# Patient Record
Sex: Male | Born: 1955 | Race: Black or African American | Hispanic: No | Marital: Married | Smoking: Current some day smoker
Health system: Southern US, Community
[De-identification: ages and names within clinical notes are randomized; demographics above are authoritative.]

## PROBLEM LIST (undated history)

## (undated) DIAGNOSIS — G43909 Migraine, unspecified, not intractable, without status migrainosus: Secondary | ICD-10-CM

## (undated) DIAGNOSIS — J42 Unspecified chronic bronchitis: Secondary | ICD-10-CM

## (undated) DIAGNOSIS — R079 Chest pain, unspecified: Secondary | ICD-10-CM

## (undated) DIAGNOSIS — M549 Dorsalgia, unspecified: Secondary | ICD-10-CM

## (undated) DIAGNOSIS — Z21 Asymptomatic human immunodeficiency virus [HIV] infection status: Secondary | ICD-10-CM

## (undated) DIAGNOSIS — R0609 Other forms of dyspnea: Secondary | ICD-10-CM

## (undated) DIAGNOSIS — I10 Essential (primary) hypertension: Secondary | ICD-10-CM

## (undated) DIAGNOSIS — G8929 Other chronic pain: Secondary | ICD-10-CM

## (undated) DIAGNOSIS — M545 Low back pain, unspecified: Secondary | ICD-10-CM

## (undated) DIAGNOSIS — M519 Unspecified thoracic, thoracolumbar and lumbosacral intervertebral disc disorder: Secondary | ICD-10-CM

## (undated) DIAGNOSIS — I723 Aneurysm of iliac artery: Secondary | ICD-10-CM

## (undated) DIAGNOSIS — J189 Pneumonia, unspecified organism: Secondary | ICD-10-CM

## (undated) DIAGNOSIS — G039 Meningitis, unspecified: Secondary | ICD-10-CM

## (undated) DIAGNOSIS — M1990 Unspecified osteoarthritis, unspecified site: Secondary | ICD-10-CM

## (undated) DIAGNOSIS — I2510 Atherosclerotic heart disease of native coronary artery without angina pectoris: Secondary | ICD-10-CM

## (undated) DIAGNOSIS — R0600 Dyspnea, unspecified: Secondary | ICD-10-CM

## (undated) HISTORY — DX: Aneurysm of iliac artery: I72.3

## (undated) HISTORY — PX: ABDOMINAL SURGERY: SHX537

## (undated) HISTORY — DX: Unspecified thoracic, thoracolumbar and lumbosacral intervertebral disc disorder: M51.9

## (undated) HISTORY — PX: SPINE SURGERY: SHX786

## (undated) HISTORY — PX: ELBOW SURGERY: SHX618

---

## 1993-09-18 HISTORY — PX: POSTERIOR LUMBAR FUSION: SHX6036

## 2002-09-18 DIAGNOSIS — Z21 Asymptomatic human immunodeficiency virus [HIV] infection status: Secondary | ICD-10-CM

## 2002-09-18 HISTORY — DX: Asymptomatic human immunodeficiency virus (hiv) infection status: Z21

## 2004-09-18 HISTORY — PX: BACK SURGERY: SHX140

## 2008-05-14 DIAGNOSIS — G8929 Other chronic pain: Secondary | ICD-10-CM | POA: Insufficient documentation

## 2008-05-14 DIAGNOSIS — F411 Generalized anxiety disorder: Secondary | ICD-10-CM | POA: Insufficient documentation

## 2008-05-14 HISTORY — DX: Other chronic pain: G89.29

## 2009-06-25 DIAGNOSIS — F1410 Cocaine abuse, uncomplicated: Secondary | ICD-10-CM | POA: Insufficient documentation

## 2009-06-25 HISTORY — DX: Cocaine abuse, uncomplicated: F14.10

## 2011-05-18 DIAGNOSIS — E559 Vitamin D deficiency, unspecified: Secondary | ICD-10-CM | POA: Insufficient documentation

## 2011-05-18 HISTORY — DX: Vitamin D deficiency, unspecified: E55.9

## 2011-06-04 ENCOUNTER — Emergency Department (HOSPITAL_COMMUNITY): Payer: Medicare Other

## 2011-06-04 ENCOUNTER — Emergency Department (HOSPITAL_COMMUNITY)
Admission: EM | Admit: 2011-06-04 | Discharge: 2011-06-04 | Disposition: A | Discharge status: Home / Self Care | Payer: Medicare Other | Attending: Emergency Medicine | Admitting: Emergency Medicine

## 2011-06-04 DIAGNOSIS — R109 Unspecified abdominal pain: Secondary | ICD-10-CM | POA: Insufficient documentation

## 2011-06-04 DIAGNOSIS — S139XXA Sprain of joints and ligaments of unspecified parts of neck, initial encounter: Secondary | ICD-10-CM | POA: Insufficient documentation

## 2011-06-04 DIAGNOSIS — S335XXA Sprain of ligaments of lumbar spine, initial encounter: Secondary | ICD-10-CM | POA: Insufficient documentation

## 2011-06-04 DIAGNOSIS — Z043 Encounter for examination and observation following other accident: Secondary | ICD-10-CM | POA: Insufficient documentation

## 2011-06-04 DIAGNOSIS — W010XXA Fall on same level from slipping, tripping and stumbling without subsequent striking against object, initial encounter: Secondary | ICD-10-CM | POA: Insufficient documentation

## 2011-06-04 MED ORDER — IOHEXOL 300 MG/ML  SOLN
130.0000 mL | Freq: Once | INTRAMUSCULAR | Status: AC | PRN
  Administered 2011-06-04: 130 mL via INTRAVENOUS

## 2011-09-19 HISTORY — PX: APPENDECTOMY: SHX54

## 2011-10-20 DIAGNOSIS — M5090 Cervical disc disorder, unspecified, unspecified cervical region: Secondary | ICD-10-CM

## 2011-10-20 HISTORY — PX: INTUSSUSCEPTION REPAIR: SHX1847

## 2011-10-20 HISTORY — DX: Cervical disc disorder, unspecified, unspecified cervical region: M50.90

## 2011-10-28 ENCOUNTER — Emergency Department (HOSPITAL_COMMUNITY)
Admission: EM | Admit: 2011-10-28 | Discharge: 2011-10-28 | Disposition: A | Discharge status: Home / Self Care | Payer: Medicare Other | Attending: Emergency Medicine | Admitting: Emergency Medicine

## 2011-10-28 ENCOUNTER — Other Ambulatory Visit: Payer: Self-pay

## 2011-10-28 ENCOUNTER — Encounter (HOSPITAL_COMMUNITY): Payer: Self-pay | Admitting: *Deleted

## 2011-10-28 DIAGNOSIS — R109 Unspecified abdominal pain: Secondary | ICD-10-CM | POA: Insufficient documentation

## 2011-10-28 DIAGNOSIS — M545 Low back pain, unspecified: Secondary | ICD-10-CM | POA: Insufficient documentation

## 2011-10-28 DIAGNOSIS — Z21 Asymptomatic human immunodeficiency virus [HIV] infection status: Secondary | ICD-10-CM | POA: Insufficient documentation

## 2011-10-28 HISTORY — DX: Asymptomatic human immunodeficiency virus (hiv) infection status: Z21

## 2011-10-28 LAB — URINALYSIS, ROUTINE W REFLEX MICROSCOPIC
Bilirubin Urine: NEGATIVE
Glucose, UA: NEGATIVE mg/dL
Hgb urine dipstick: NEGATIVE
Ketones, ur: NEGATIVE mg/dL
Leukocytes, UA: NEGATIVE
Nitrite: NEGATIVE
Protein, ur: NEGATIVE mg/dL
Specific Gravity, Urine: 1.022 (ref 1.005–1.030)
Urobilinogen, UA: 1 mg/dL (ref 0.0–1.0)
pH: 5.5 (ref 5.0–8.0)

## 2011-10-28 LAB — DIFFERENTIAL
Basophils Absolute: 0 10*3/uL (ref 0.0–0.1)
Basophils Relative: 1 % (ref 0–1)
Eosinophils Absolute: 0.2 10*3/uL (ref 0.0–0.7)
Eosinophils Relative: 3 % (ref 0–5)
Lymphocytes Relative: 44 % (ref 12–46)
Lymphs Abs: 2.7 10*3/uL (ref 0.7–4.0)
Monocytes Absolute: 0.6 10*3/uL (ref 0.1–1.0)
Monocytes Relative: 10 % (ref 3–12)
Neutro Abs: 2.6 10*3/uL (ref 1.7–7.7)
Neutrophils Relative %: 42 % — ABNORMAL LOW (ref 43–77)

## 2011-10-28 LAB — COMPREHENSIVE METABOLIC PANEL
ALT: 18 U/L (ref 0–53)
AST: 18 U/L (ref 0–37)
Albumin: 3.4 g/dL — ABNORMAL LOW (ref 3.5–5.2)
Alkaline Phosphatase: 95 U/L (ref 39–117)
BUN: 13 mg/dL (ref 6–23)
CO2: 25 mEq/L (ref 19–32)
Calcium: 9.4 mg/dL (ref 8.4–10.5)
Chloride: 104 mEq/L (ref 96–112)
Creatinine, Ser: 0.81 mg/dL (ref 0.50–1.35)
GFR calc Af Amer: >90 mL/min (ref >90)
GFR calc non Af Amer: >90 mL/min (ref >90)
Glucose, Bld: 98 mg/dL (ref 70–99)
Potassium: 3.5 mEq/L (ref 3.5–5.1)
Sodium: 138 mEq/L (ref 135–145)
Total Bilirubin: 0.3 mg/dL (ref 0.3–1.2)
Total Protein: 8.2 g/dL (ref 6.0–8.3)

## 2011-10-28 LAB — TROPONIN I: Troponin I: <0.3 ng/mL (ref <0.30)

## 2011-10-28 LAB — CBC
HCT: 36.2 % — ABNORMAL LOW (ref 39.0–52.0)
Hemoglobin: 11.9 g/dL — ABNORMAL LOW (ref 13.0–17.0)
MCH: 26 pg (ref 26.0–34.0)
MCHC: 32.9 g/dL (ref 30.0–36.0)
MCV: 79 fL (ref 78.0–100.0)
Platelets: 318 10*3/uL (ref 150–400)
RBC: 4.58 MIL/uL (ref 4.22–5.81)
RDW: 18.6 % — ABNORMAL HIGH (ref 11.5–15.5)
WBC: 6.1 10*3/uL (ref 4.0–10.5)

## 2011-10-28 LAB — LIPASE, BLOOD: Lipase: 31 U/L (ref 11–59)

## 2011-10-28 MED ORDER — HYDROMORPHONE HCL PF 1 MG/ML IJ SOLN
1.0000 mg | Freq: Once | INTRAMUSCULAR | Status: AC
  Administered 2011-10-28: 1 mg via INTRAVENOUS
  Filled 2011-10-28: qty 1

## 2011-10-28 MED ORDER — HYDROCODONE-ACETAMINOPHEN 5-325 MG PO TABS: 1.0000 | ORAL_TABLET | ORAL | Status: AC | PRN

## 2011-10-28 NOTE — ED Notes (Signed)
Pt had appendectomy 08/28/11.  Pt states he picked his granddaughter up this morning and felt a sudden sharp pain in his lower abd near the bottom of his incision.  Incision has healed completely, no swelling noted around site of pain.

## 2011-10-28 NOTE — ED Provider Notes (Signed)
History     CSN: [id number]  Arrival date & time 10/28/11  1120   First MD Initiated Contact with Patient 10/28/11 1131      Chief Complaint  Patient presents with  . Abdominal Pain    (Consider location/radiation/quality/duration/timing/severity/associated sxs/prior treatment) HPI History provided by pt and his wife.  Patient's wife reports that pt seen at [facility] ED for abdominal pain in 08/2011, CT concerning for intussusception and he had emergent surgery.  Bowel was normal but appendix appeared to be infected and was removed.  Shortly after discharge from hospital he developed high fever and was re-admitted to the ICU for aseptic meningitis.  Has been home since mid-January.  Today he presents w/ diffuse, severe lower abdominal pain w/ radiation around to left and right lower back since this morning.  Started approx 5 minutes after lifting his grandchild.  Has had similar pain intermittently since surgery but seems to worsen each time.  No associated fever, N/V/D, urinary sx or testicular pain.  Most recent BM this morning and normal and has been passing gas. Denies CP and SOB.  Pt is HIV positive and most recent CD4 count greater than 1000 in January.  No past medical history on file.  No past surgical history on file.  No family history on file.  History  Substance Use Topics  . Smoking status: Not on file  . Smokeless tobacco: Not on file  . Alcohol Use: Not on file      Review of Systems  All other systems reviewed and are negative.    Allergies  Flexeril; Ibuprofen; Naproxen; and Tramadol  Home Medications  No current outpatient prescriptions on file.  BP 146/93  Pulse 78  Temp(Src) 98 F (36.7 C) (Oral)  Resp 16  Ht 5\' 8"  (1.727 m)  Wt 147 lb (66.679 kg)  BMI 22.35 kg/m2  SpO2 96%  Physical Exam  Nursing note and vitals reviewed. Constitutional: He is oriented to person, place, and time. He appears well-developed and well-nourished. No distress.       Uncomfortable appearing  HENT:  Head: Normocephalic and atraumatic.  Eyes:       Normal appearance  Neck: Normal range of motion.  Cardiovascular: Normal rate and regular rhythm.   Pulmonary/Chest: Effort normal and breath sounds normal.  Abdominal: Bowel sounds are normal. He exhibits no distension and no mass. There is tenderness. There is no rebound.       Large, mid-line surgical scar.  Diffuse tenderness.  Pt points to RUQ as being most painful location.  Pt guarding w/ contracted muscles. No CVA tenderness.  Genitourinary:       Penis uncircumcised. No urethral discharge.  No rash.  Testicles descended bilaterally and both non-tender.   Neurological: He is alert and oriented to person, place, and time.  Skin: Skin is warm and dry. No rash noted.  Psychiatric: He has a normal mood and affect. His behavior is normal.    ED Course  Procedures (including critical care time)   Date: 10/28/2011  Rate: 77  Rhythm: normal sinus rhythm  QRS Axis: left  Intervals: normal  ST/T Wave abnormalities: nonspecific ST changes  Conduction Disutrbances:none  Narrative Interpretation: borderline prolonged QT  Old EKG Reviewed: none available   Labs Reviewed  CBC - Abnormal; Notable for the following:    Hemoglobin 11.9 (*)    HCT 36.2 (*)    RDW 18.6 (*)    All other components within normal limits  DIFFERENTIAL - Abnormal;  Notable for the following:    Neutrophils Relative 42 (*)    All other components within normal limits  COMPREHENSIVE METABOLIC PANEL - Abnormal; Notable for the following:    Albumin 3.4 (*)    All other components within normal limits  LIPASE, BLOOD  URINALYSIS, ROUTINE W REFLEX MICROSCOPIC  TROPONIN I   No results found.   1. Abdominal pain       MDM  55yo M w/ recent appendectomy presents w/ c/o acute onset severe lower abdominal pain since lifting his grandchild this am.  Has similar pain intermittently since surgery but seems to worsen each  time.  No associated sx.  Afebrile, uncomfortable appearing, abd diffusely ttp w/ guarding (worst in RUQ and this is where patient points to as  most painful location currently) on exam.  Cholelithiasis unlikely because onset acute and no recent post-prandial pain/nausea.  SBO unlikely because normal BM this morning and has been passing gas. His physician has instructed him to avoid cat scans if possible because he has had so many in the recent past.  IV Dilaudid ordered for pain and labs pending.  EKG and troponin ordered d/t reported h/o MI and location of pain.   EKG non-ischemic and labs unremarkable.  Results discussed w/ pt.  He reports improvement in pain w/ single dose of IV dilaudid and he no longer appears uncomfortable.  On re-examination, tenderness localized to lower abdomen.  Most likely muscular or post-op pain related to heavy lifting.  Pt d/c'd home w/ vicodin for pain and advised to return if pain worsens or he develops fever or uncontrolled vomiting.  He has a f/u appt scheduled with his surgeon on Monday.        Arie Sabina [place], [place] 10/28/11 1345

## 2011-10-28 NOTE — ED Provider Notes (Signed)
Medical screening examination/treatment/procedure(s) were performed by non-physician practitioner and as supervising physician I was immediately available for consultation/collaboration.  Juliet Rude. Rubin Payor, MD 10/28/11 [phone]

## 2011-10-28 NOTE — ED Notes (Signed)
Patient given discharge instructions, information, prescriptions, and diet order. Patient states that they adequately understand discharge information given and to return to ED if symptoms return or worsen.     

## 2012-01-13 DIAGNOSIS — K219 Gastro-esophageal reflux disease without esophagitis: Secondary | ICD-10-CM | POA: Insufficient documentation

## 2012-01-13 HISTORY — DX: Gastro-esophageal reflux disease without esophagitis: K21.9

## 2012-08-17 ENCOUNTER — Emergency Department (HOSPITAL_COMMUNITY)
Admission: EM | Admit: 2012-08-17 | Discharge: 2012-08-17 | Disposition: A | Discharge status: Home / Self Care | Payer: Medicare Other | Attending: Emergency Medicine | Admitting: Emergency Medicine

## 2012-08-17 ENCOUNTER — Emergency Department (HOSPITAL_COMMUNITY): Payer: Medicare Other

## 2012-08-17 ENCOUNTER — Encounter (HOSPITAL_COMMUNITY): Payer: Self-pay | Admitting: Emergency Medicine

## 2012-08-17 DIAGNOSIS — M25552 Pain in left hip: Secondary | ICD-10-CM

## 2012-08-17 DIAGNOSIS — S79919A Unspecified injury of unspecified hip, initial encounter: Secondary | ICD-10-CM | POA: Insufficient documentation

## 2012-08-17 DIAGNOSIS — Z981 Arthrodesis status: Secondary | ICD-10-CM | POA: Insufficient documentation

## 2012-08-17 DIAGNOSIS — Y9389 Activity, other specified: Secondary | ICD-10-CM | POA: Insufficient documentation

## 2012-08-17 DIAGNOSIS — M25519 Pain in unspecified shoulder: Secondary | ICD-10-CM

## 2012-08-17 DIAGNOSIS — S46909A Unspecified injury of unspecified muscle, fascia and tendon at shoulder and upper arm level, unspecified arm, initial encounter: Secondary | ICD-10-CM | POA: Insufficient documentation

## 2012-08-17 DIAGNOSIS — Z21 Asymptomatic human immunodeficiency virus [HIV] infection status: Secondary | ICD-10-CM | POA: Insufficient documentation

## 2012-08-17 DIAGNOSIS — Z79899 Other long term (current) drug therapy: Secondary | ICD-10-CM | POA: Insufficient documentation

## 2012-08-17 DIAGNOSIS — S4980XA Other specified injuries of shoulder and upper arm, unspecified arm, initial encounter: Secondary | ICD-10-CM | POA: Insufficient documentation

## 2012-08-17 DIAGNOSIS — W108XXA Fall (on) (from) other stairs and steps, initial encounter: Secondary | ICD-10-CM | POA: Insufficient documentation

## 2012-08-17 DIAGNOSIS — Y929 Unspecified place or not applicable: Secondary | ICD-10-CM | POA: Insufficient documentation

## 2012-08-17 DIAGNOSIS — I10 Essential (primary) hypertension: Secondary | ICD-10-CM | POA: Insufficient documentation

## 2012-08-17 HISTORY — DX: Essential (primary) hypertension: I10

## 2012-08-17 MED ORDER — HYDROCODONE-ACETAMINOPHEN 5-325 MG PO TABS
1.0000 | ORAL_TABLET | Freq: Three times a day (TID) | ORAL | Status: DC | PRN
Start: 2012-08-17 — End: 2012-08-26

## 2012-08-17 MED ORDER — DIPHENHYDRAMINE HCL 25 MG PO CAPS
25.0000 mg | ORAL_CAPSULE | Freq: Four times a day (QID) | ORAL | Status: AC | PRN
  Administered 2012-08-17: 25 mg via ORAL
  Filled 2012-08-17: qty 1

## 2012-08-17 MED ORDER — HYDROMORPHONE HCL PF 1 MG/ML IJ SOLN
1.0000 mg | Freq: Once | INTRAMUSCULAR | Status: AC
  Administered 2012-08-17: 1 mg via INTRAVENOUS
  Filled 2012-08-17: qty 1

## 2012-08-17 NOTE — ED Notes (Signed)
Patient states that about 30 minutes ago he feel down 12 steps and now has back pain. History of fusion to his back. PAtient reports pain to his back and right shoulder numbness to his left foot

## 2012-08-17 NOTE — ED Notes (Signed)
Discharge instructions reviewed. Patient discharged with ice packs, r arm in sling. All questions answered. Rx given x1.

## 2012-08-17 NOTE — ED Notes (Signed)
Patient c/o increased pain since being taken to radiology. Patient states they made everything worse. Patient requesting a sling for his shoulder. Will con't to monitor.

## 2012-08-17 NOTE — ED Notes (Addendum)
Pt presents to ED with c/o fall. Pt sts he was helping his wife move some pots down the stairs when he lost his balance and fell. Pt also sts he "tumbled down from top to botom", 12 steps. Pt is c/o severe pain in his right shoulder, back all the way from the neck to his hips and sts his left leg is numb and can't feel his toes. Pulses are palpable, pt is able to move his toes. Pt also sts he hit his head, pt denies LOC there is area on the left side of his head, nickle size, that is swollen. Pt sts it's not painful. Pt is lying in the bed, appears in discomfort, sts pain is 10/10

## 2012-08-17 NOTE — Progress Notes (Signed)
Orthopedic Tech Progress Note Patient Details:  Kenneth [name] [birth year] [id number] Sling Immobilizer applied to Right UE with instruction. Patient tolerated well.  Ortho Devices Type of Ortho Device: Sling immobilizer Ortho Device/Splint Location: Right Ortho Device/Splint Interventions: Application   Asia R Thompson 08/17/2012, 9:25 PM

## 2012-08-17 NOTE — ED Notes (Addendum)
Patient c/o pain 9/10, to right shoulder, left hip down to toes, and left side of back radiating towards his neck. Patient able to move toes and identify digits bilateral. Patient states sensation to toes is same bilaterally. Small bump to left forehead, patient denies pain to his head. VSS.

## 2012-08-17 NOTE — ED Provider Notes (Signed)
History     CSN: [id number]  Arrival date & time 08/17/12  1656   First MD Initiated Contact with Patient 08/17/12 1833      Chief Complaint  Patient presents with  . Back Pain  . Fall    (Consider location/radiation/quality/duration/timing/severity/associated sxs/prior treatment) HPI The patient presents after a fall.  He states that he was in his usual state of health until a fall.  He states that he lost his balance, bounced down approximately 12 steps.  He denies significant head trauma, loss of consciousness, any residual confusion, disorientation, visual changes, nausea, vomiting.  He has been in [employer] since the fall.  He notes that he fell, most probably on his left hip, right shoulder.  In these areas he has had pain since the fall.  Pain is sharp, worse with motion.  No attempts as relief thus far. Past Medical History  Diagnosis Date  . HIV positive   . Hypertension     Past Surgical History  Procedure Date  . Abdominal surgery   . Back surgery   . Appendectomy     History reviewed. No pertinent family history.  History  Substance Use Topics  . Smoking status: Never Smoker   . Smokeless tobacco: Not on file  . Alcohol Use: No      Review of Systems  Constitutional:       Per HPI, otherwise negative  HENT:       Per HPI, otherwise negative  Eyes: Negative.   Respiratory:       Per HPI, otherwise negative  Cardiovascular:       Per HPI, otherwise negative  Gastrointestinal: Negative for vomiting.  Genitourinary: Negative.   Musculoskeletal:       Per HPI, otherwise negative  Skin: Negative.   Neurological: Negative for syncope.    Allergies  Flexeril; Ibuprofen; Naproxen; and Tramadol  Home Medications   Current Outpatient Rx  Name  Route  Sig  Dispense  Refill  . IPRATROPIUM-ALBUTEROL 18-103 MCG/ACT IN AERO   Inhalation   Inhale 2 puffs into the lungs every 6 (six) hours as needed.         Marland Kitchen METOPROLOL TARTRATE 25 MG PO TABS  Oral   Take 25 mg by mouth 2 (two) times daily.         . ADULT MULTIVITAMIN W/MINERALS CH   Oral   Take 1 tablet by mouth daily.         Marland Kitchen RAMIPRIL 1.25 MG PO CAPS   Oral   Take 1.25 mg by mouth 2 (two) times daily.           BP 138/97  Pulse 83  Temp 97.8 F (36.6 C) (Oral)  Resp 18  SpO2 97%  Physical Exam  Nursing note and vitals reviewed. Constitutional: He is oriented to person, place, and time. He appears well-developed. No distress.  HENT:  Head: Normocephalic and atraumatic.  Eyes: Conjunctivae normal and EOM are normal.  Cardiovascular: Normal rate and regular rhythm.   Pulmonary/Chest: Effort normal. No stridor. No respiratory distress.  Abdominal: He exhibits no distension.  Musculoskeletal: He exhibits no edema.       Right shoulder: He exhibits decreased range of motion, tenderness, bony tenderness and pain. He exhibits no swelling, no effusion, no crepitus, no deformity, no spasm, normal pulse and normal strength.       Left shoulder: Normal.       Right elbow: Normal.      Left elbow:  Normal.       Right wrist: Normal.       Left wrist: Normal.       Right hip: Normal.       Left hip: He exhibits tenderness and bony tenderness. He exhibits no swelling and no crepitus.       Right knee: Normal.       Left knee: Normal.       Right ankle: Normal.       Left ankle: Normal.  Neurological: He is alert and oriented to person, place, and time.  Skin: Skin is warm and dry.  Psychiatric: He has a normal mood and affect.    ED Course  Procedures (including critical care time)  Labs Reviewed - No data to display No results found.   No diagnosis found.  9:05 PM Patient was feeling better, but his pain recurred during x-ray.  Additional analgesics ordered.  Update: Patient is ambulatory, states that he feels better.  MDM  This patient presents after a fall with pain in his right shoulder and left hip.  The patient is ambulatory.  The patient's  x-rays do not demonstrate fracture.  He states that he feels better.  He requested a sling for comfort, and this was provided.  The patient is new to the area, and also provided information on a new primary care followup, management of his HIV.  This was provided orthopedics followup for pain management issues, further evaluation as needed.      Gerhard Munch, MD 08/18/12 [phone]

## 2012-08-26 ENCOUNTER — Emergency Department (HOSPITAL_COMMUNITY): Payer: Medicare Other

## 2012-08-26 ENCOUNTER — Inpatient Hospital Stay (HOSPITAL_COMMUNITY)
Admission: EM | Admit: 2012-08-26 | Discharge: 2012-08-29 | DRG: 976 | Disposition: A | Discharge status: Home / Self Care | Payer: Medicare Other | Attending: Internal Medicine | Admitting: Internal Medicine

## 2012-08-26 ENCOUNTER — Encounter (HOSPITAL_COMMUNITY): Payer: Self-pay

## 2012-08-26 DIAGNOSIS — M542 Cervicalgia: Secondary | ICD-10-CM

## 2012-08-26 DIAGNOSIS — G8929 Other chronic pain: Secondary | ICD-10-CM | POA: Diagnosis present

## 2012-08-26 DIAGNOSIS — R7309 Other abnormal glucose: Secondary | ICD-10-CM | POA: Diagnosis present

## 2012-08-26 DIAGNOSIS — B003 Herpesviral meningitis: Secondary | ICD-10-CM | POA: Diagnosis present

## 2012-08-26 DIAGNOSIS — Z21 Asymptomatic human immunodeficiency virus [HIV] infection status: Secondary | ICD-10-CM | POA: Diagnosis present

## 2012-08-26 DIAGNOSIS — F17200 Nicotine dependence, unspecified, uncomplicated: Secondary | ICD-10-CM | POA: Diagnosis present

## 2012-08-26 DIAGNOSIS — Z79899 Other long term (current) drug therapy: Secondary | ICD-10-CM

## 2012-08-26 DIAGNOSIS — R5381 Other malaise: Secondary | ICD-10-CM | POA: Diagnosis present

## 2012-08-26 DIAGNOSIS — R42 Dizziness and giddiness: Secondary | ICD-10-CM | POA: Diagnosis present

## 2012-08-26 DIAGNOSIS — E876 Hypokalemia: Secondary | ICD-10-CM | POA: Diagnosis present

## 2012-08-26 DIAGNOSIS — I2510 Atherosclerotic heart disease of native coronary artery without angina pectoris: Secondary | ICD-10-CM | POA: Diagnosis present

## 2012-08-26 DIAGNOSIS — G032 Benign recurrent meningitis [Mollaret]: Secondary | ICD-10-CM

## 2012-08-26 DIAGNOSIS — J45909 Unspecified asthma, uncomplicated: Secondary | ICD-10-CM | POA: Diagnosis present

## 2012-08-26 DIAGNOSIS — I10 Essential (primary) hypertension: Secondary | ICD-10-CM | POA: Diagnosis present

## 2012-08-26 DIAGNOSIS — B952 Enterococcus as the cause of diseases classified elsewhere: Secondary | ICD-10-CM | POA: Diagnosis present

## 2012-08-26 DIAGNOSIS — R739 Hyperglycemia, unspecified: Secondary | ICD-10-CM | POA: Diagnosis present

## 2012-08-26 DIAGNOSIS — B20 Human immunodeficiency virus [HIV] disease: Principal | ICD-10-CM | POA: Diagnosis present

## 2012-08-26 DIAGNOSIS — G039 Meningitis, unspecified: Secondary | ICD-10-CM

## 2012-08-26 HISTORY — DX: Atherosclerotic heart disease of native coronary artery without angina pectoris: I25.10

## 2012-08-26 HISTORY — DX: Benign recurrent meningitis (mollaret): G03.2

## 2012-08-26 HISTORY — DX: Meningitis, unspecified: G03.9

## 2012-08-26 LAB — CSF CELL COUNT WITH DIFFERENTIAL
RBC Count, CSF: 0 /mm3
RBC Count, CSF: 1 /mm3 — ABNORMAL HIGH
Tube #: 1
Tube #: 4
WBC, CSF: 3 /mm3 (ref 0–5)
WBC, CSF: 7 /mm3 — ABNORMAL HIGH (ref 0–5)

## 2012-08-26 LAB — CREATININE, SERUM
Creatinine, Ser: 0.82 mg/dL (ref 0.50–1.35)
GFR calc Af Amer: >90 mL/min (ref >90)
GFR calc non Af Amer: >90 mL/min (ref >90)

## 2012-08-26 LAB — URINALYSIS, ROUTINE W REFLEX MICROSCOPIC
Bilirubin Urine: NEGATIVE
Glucose, UA: NEGATIVE mg/dL
Hgb urine dipstick: NEGATIVE
Ketones, ur: NEGATIVE mg/dL
Leukocytes, UA: NEGATIVE
Nitrite: NEGATIVE
Protein, ur: NEGATIVE mg/dL
Specific Gravity, Urine: 1.016 (ref 1.005–1.030)
Urobilinogen, UA: 0.2 mg/dL (ref 0.0–1.0)
pH: 6 (ref 5.0–8.0)

## 2012-08-26 LAB — CBC WITH DIFFERENTIAL/PLATELET
Basophils Absolute: 0 10*3/uL (ref 0.0–0.1)
Basophils Relative: 1 % (ref 0–1)
Eosinophils Absolute: 0 10*3/uL (ref 0.0–0.7)
Eosinophils Relative: 0 % (ref 0–5)
HCT: 44.8 % (ref 39.0–52.0)
Hemoglobin: 15 g/dL (ref 13.0–17.0)
Lymphocytes Relative: 32 % (ref 12–46)
Lymphs Abs: 2.1 10*3/uL (ref 0.7–4.0)
MCH: 27.3 pg (ref 26.0–34.0)
MCHC: 33.5 g/dL (ref 30.0–36.0)
MCV: 81.5 fL (ref 78.0–100.0)
Monocytes Absolute: 0.6 10*3/uL (ref 0.1–1.0)
Monocytes Relative: 8 % (ref 3–12)
Neutro Abs: 3.9 10*3/uL (ref 1.7–7.7)
Neutrophils Relative %: 59 % (ref 43–77)
Platelets: 320 10*3/uL (ref 150–400)
RBC: 5.5 MIL/uL (ref 4.22–5.81)
RDW: 15.4 % (ref 11.5–15.5)
WBC: 6.6 10*3/uL (ref 4.0–10.5)

## 2012-08-26 LAB — CBC
HCT: 42.2 % (ref 39.0–52.0)
Hemoglobin: 13.9 g/dL (ref 13.0–17.0)
MCH: 27.1 pg (ref 26.0–34.0)
MCHC: 32.9 g/dL (ref 30.0–36.0)
MCV: 82.3 fL (ref 78.0–100.0)
Platelets: 278 10*3/uL (ref 150–400)
RBC: 5.13 MIL/uL (ref 4.22–5.81)
RDW: 15.5 % (ref 11.5–15.5)
WBC: 7.5 10*3/uL (ref 4.0–10.5)

## 2012-08-26 LAB — PROTEIN AND GLUCOSE, CSF
Glucose, CSF: 64 mg/dL (ref 43–76)
Total  Protein, CSF: 47 mg/dL — ABNORMAL HIGH (ref 15–45)

## 2012-08-26 LAB — COMPREHENSIVE METABOLIC PANEL
ALT: 8 U/L (ref 0–53)
AST: 15 U/L (ref 0–37)
Albumin: 3.8 g/dL (ref 3.5–5.2)
Alkaline Phosphatase: 98 U/L (ref 39–117)
BUN: 13 mg/dL (ref 6–23)
CO2: 26 mEq/L (ref 19–32)
Calcium: 9.7 mg/dL (ref 8.4–10.5)
Chloride: 100 mEq/L (ref 96–112)
Creatinine, Ser: 0.78 mg/dL (ref 0.50–1.35)
GFR calc Af Amer: >90 mL/min (ref >90)
GFR calc non Af Amer: >90 mL/min (ref >90)
Glucose, Bld: 78 mg/dL (ref 70–99)
Potassium: 3.3 mEq/L — ABNORMAL LOW (ref 3.5–5.1)
Sodium: 137 mEq/L (ref 135–145)
Total Bilirubin: 0.2 mg/dL — ABNORMAL LOW (ref 0.3–1.2)
Total Protein: 8.5 g/dL — ABNORMAL HIGH (ref 6.0–8.3)

## 2012-08-26 LAB — GLUCOSE, CAPILLARY: Glucose-Capillary: 90 mg/dL (ref 70–99)

## 2012-08-26 LAB — PROCALCITONIN: Procalcitonin: <0.1 ng/mL

## 2012-08-26 LAB — GRAM STAIN

## 2012-08-26 LAB — TROPONIN I
Troponin I: <0.3 ng/mL (ref <0.30)
Troponin I: <0.3 ng/mL (ref <0.30)

## 2012-08-26 LAB — LIPASE, BLOOD: Lipase: 16 U/L (ref 11–59)

## 2012-08-26 LAB — LACTATE DEHYDROGENASE: LDH: 206 U/L (ref 94–250)

## 2012-08-26 LAB — LACTIC ACID, PLASMA: Lactic Acid, Venous: 1.2 mmol/L (ref 0.5–2.2)

## 2012-08-26 MED ORDER — SODIUM CHLORIDE 0.9 % IJ SOLN
3.0000 mL | Freq: Two times a day (BID) | INTRAMUSCULAR | Status: DC
  Administered 2012-08-28 (×2): 3 mL via INTRAVENOUS

## 2012-08-26 MED ORDER — IPRATROPIUM-ALBUTEROL 18-103 MCG/ACT IN AERO: 2.0000 | INHALATION_SPRAY | Freq: Four times a day (QID) | RESPIRATORY_TRACT | Status: DC | PRN

## 2012-08-26 MED ORDER — SODIUM CHLORIDE 0.9 % IJ SOLN
3.0000 mL | Freq: Two times a day (BID) | INTRAMUSCULAR | Status: DC
  Administered 2012-08-26: 3 mL via INTRAVENOUS

## 2012-08-26 MED ORDER — RAMIPRIL 1.25 MG PO CAPS
1.2500 mg | ORAL_CAPSULE | Freq: Two times a day (BID) | ORAL | Status: DC
Start: 2012-08-26 — End: 2012-08-29
  Administered 2012-08-26 – 2012-08-28 (×5): 1.25 mg via ORAL
  Filled 2012-08-26 (×7): qty 1

## 2012-08-26 MED ORDER — DEXTROSE 5 % IV SOLN
700.0000 mg | Freq: Three times a day (TID) | INTRAVENOUS | Status: DC
  Administered 2012-08-26 – 2012-08-28 (×5): 700 mg via INTRAVENOUS
  Filled 2012-08-26 (×7): qty 14

## 2012-08-26 MED ORDER — HYDROCODONE-ACETAMINOPHEN 5-325 MG PO TABS: 1.0000 | ORAL_TABLET | Freq: Three times a day (TID) | ORAL | Status: DC | PRN

## 2012-08-26 MED ORDER — ADULT MULTIVITAMIN W/MINERALS CH
1.0000 | ORAL_TABLET | Freq: Every day | ORAL | Status: DC
  Administered 2012-08-27 – 2012-08-29 (×4): 1 via ORAL
  Filled 2012-08-26 (×4): qty 1

## 2012-08-26 MED ORDER — SODIUM CHLORIDE 0.9 % IV SOLN: 250.0000 mL | INTRAVENOUS | Status: DC | PRN

## 2012-08-26 MED ORDER — METOPROLOL TARTRATE 25 MG PO TABS
25.0000 mg | ORAL_TABLET | Freq: Two times a day (BID) | ORAL | Status: DC
  Administered 2012-08-26: 25 mg via ORAL
  Filled 2012-08-26: qty 1

## 2012-08-26 MED ORDER — SODIUM CHLORIDE 0.9 % IJ SOLN: 3.0000 mL | INTRAMUSCULAR | Status: DC | PRN

## 2012-08-26 MED ORDER — HYDROMORPHONE HCL PF 2 MG/ML IJ SOLN
2.0000 mg | INTRAMUSCULAR | Status: DC | PRN
  Administered 2012-08-26 – 2012-08-29 (×16): 2 mg via INTRAVENOUS
  Filled 2012-08-26 (×16): qty 1

## 2012-08-26 MED ORDER — IPRATROPIUM-ALBUTEROL 20-100 MCG/ACT IN AERS
1.0000 | INHALATION_SPRAY | Freq: Four times a day (QID) | RESPIRATORY_TRACT | Status: DC | PRN
  Administered 2012-08-27 (×3): 1 via RESPIRATORY_TRACT
  Filled 2012-08-26: qty 4

## 2012-08-26 MED ORDER — SODIUM CHLORIDE 0.9 % IV SOLN
1000.0000 mL | INTRAVENOUS | Status: DC
  Administered 2012-08-26 – 2012-08-28 (×4): 1000 mL via INTRAVENOUS

## 2012-08-26 MED ORDER — SODIUM CHLORIDE 0.9 % IV SOLN
1000.0000 mL | Freq: Once | INTRAVENOUS | Status: DC
  Administered 2012-08-26: 1000 mL via INTRAVENOUS

## 2012-08-26 MED ORDER — HYDROMORPHONE HCL PF 1 MG/ML IJ SOLN
1.0000 mg | INTRAMUSCULAR | Status: DC | PRN
  Administered 2012-08-26 (×3): 1 mg via INTRAVENOUS
  Filled 2012-08-26 (×3): qty 1

## 2012-08-26 MED ORDER — POTASSIUM CHLORIDE CRYS ER 20 MEQ PO TBCR
20.0000 meq | EXTENDED_RELEASE_TABLET | Freq: Once | ORAL | Status: AC
  Administered 2012-08-27: 20 meq via ORAL
  Filled 2012-08-26: qty 1

## 2012-08-26 MED ORDER — HEPARIN SODIUM (PORCINE) 5000 UNIT/ML IJ SOLN
5000.0000 [IU] | Freq: Three times a day (TID) | INTRAMUSCULAR | Status: DC
  Administered 2012-08-26 – 2012-08-29 (×8): 5000 [IU] via SUBCUTANEOUS
  Filled 2012-08-26 (×11): qty 1

## 2012-08-26 NOTE — ED Provider Notes (Signed)
History     CSN: [id number]  Arrival date & time 08/26/12  1013   First MD Initiated Contact with Patient 08/26/12 1040      Chief Complaint  Patient presents with  . Chest Pain  . Neck Pain    HPI   The patient presents with concerns of headache, neck pain, dyspnea, dizziness.  Symptoms actually began suddenly, yesterday.  Over the past day he is become more symptomatic, with increasing pain primarily in his neck.  He notes that the neck discomfort is most concerning, reminiscent of a prior episode of meningitis.  He denies fevers, chills, emesis or diarrhea.  He denies cough, significant dyspnea.  No clear alleviating or exacerbating factors.  Past Medical History  Diagnosis Date  . HIV positive   . Hypertension   . Meningitis   . Bronchitis   . Coronary artery disease     Past Surgical History  Procedure Date  . Abdominal surgery   . Back surgery   . Appendectomy   . Elbow surgery     No family history on file.  History  Substance Use Topics  . Smoking status: Current Every Day Smoker  . Smokeless tobacco: Never Used  . Alcohol Use: No      Review of Systems  Constitutional: Positive for fever.  HENT: Positive for neck pain and neck stiffness.   Eyes: Negative for pain and visual disturbance.  Respiratory: Positive for chest tightness. Negative for shortness of breath.   Cardiovascular: Positive for chest pain.  Gastrointestinal: Positive for nausea. Negative for vomiting.  Genitourinary: Negative.   Musculoskeletal: Positive for back pain.  Skin: Negative for rash.  Neurological: Positive for light-headedness and headaches. Negative for dizziness, seizures, weakness and numbness.  Hematological:       HIV  Psychiatric/Behavioral: Negative for confusion.    Allergies  Flexeril; Ibuprofen; Naproxen; and Tramadol  Home Medications   Current Outpatient Rx  Name  Route  Sig  Dispense  Refill  . IPRATROPIUM-ALBUTEROL 18-103 MCG/ACT IN AERO  Inhalation   Inhale 2 puffs into the lungs every 6 (six) hours as needed.         Marland Kitchen HYDROCODONE-ACETAMINOPHEN 5-325 MG PO TABS   Oral   Take 1 tablet by mouth every 8 (eight) hours as needed for pain.   15 tablet   0   . METOPROLOL TARTRATE 25 MG PO TABS   Oral   Take 25 mg by mouth 2 (two) times daily.         . ADULT MULTIVITAMIN W/MINERALS CH   Oral   Take 1 tablet by mouth daily.         Marland Kitchen RAMIPRIL 1.25 MG PO CAPS   Oral   Take 1.25 mg by mouth 2 (two) times daily.           BP 172/113  Pulse 63  Temp 97.8 F (36.6 C) (Oral)  Resp 27  SpO2 96%  Physical Exam  Nursing note and vitals reviewed. Constitutional: He is oriented to person, place, and time. Vital signs are normal. He appears distressed.  HENT:  Nose: Nose normal.  Mouth/Throat: Uvula is midline and oropharynx is clear and moist.  Eyes: Conjunctivae normal and EOM are normal. Pupils are equal, round, and reactive to light.  Neck: Spinous process tenderness and muscular tenderness present. Rigidity present. Decreased range of motion present. Brudzinski's sign noted.  Cardiovascular: Normal rate and regular rhythm.   Pulmonary/Chest: No stridor. Tachypnea noted.  Abdominal:  Minimal diffuse ttp, no guarding or rebound  Musculoskeletal:       Arms: Lymphadenopathy:    He has no cervical adenopathy.  Neurological: He is alert and oriented to person, place, and time. No cranial nerve deficit or sensory deficit. He exhibits normal muscle tone.  Skin: He is diaphoretic.  Psychiatric: His mood appears anxious.    ED Course  Procedures (including critical care time)   Labs Reviewed  CULTURE, BLOOD (ROUTINE X 2)  CULTURE, BLOOD (ROUTINE X 2)  CBC WITH DIFFERENTIAL  COMPREHENSIVE METABOLIC PANEL  LACTIC ACID, PLASMA  PROCALCITONIN  URINALYSIS, ROUTINE W REFLEX MICROSCOPIC  URINE CULTURE  LIPASE, BLOOD  LACTATE DEHYDROGENASE   No results found.   No diagnosis found.  Cardiac: 81  sr, normal  O2- 97%ra, normal   Date: 08/26/2012  Rate: 70  Rhythm: normal sinus rhythm  QRS Axis: left  Intervals: normal  ST/T Wave abnormalities: nonspecific T wave changes  Conduction Disutrbances:none  Narrative Interpretation:   Old EKG Reviewed: unchanged  ABNORMAL  The patient has paperwork listing his most recent CD4 >500, VL >500 (from one year ago)  Given his history of multiple back procedures requested fluoroscopic LP.  This was accomplished without complication.  MDM  This patient with a history of meningitis, HIV, now presents with ongoing neck pain.  The patient has other complaints, but his primary concern is neck pain, and on exam he is hesitant to move the neck in any dimensions.  He is afebrile, though uncomfortable, he is in no distress.  However, given his comorbidities, his prior history of meningitis, this was a primary concern.  With history of multiple prior lumbar surgery he acquired fluoroscopic lumbar puncture.  This was accomplished without complication.  Given the findings on his studies, he was admitted for further evaluation and management.     Gerhard Munch, MD 08/28/12 1220

## 2012-08-26 NOTE — ED Notes (Signed)
Patient reports that he has left chest pain, SOB, dizziness, and posterior neck pain. Patient has a strong family history of heart problems and patient has a history of meningitis last year.

## 2012-08-26 NOTE — Procedures (Signed)
Technique:  Informed consent was obtained from the patient prior to the procedure, including potential complications of headache, allergy, and pain.   With the patient prone, the lower back was prepped with Betadine.  1% Lidocaine was used for local anesthesia.  Lumbar puncture was performed at the [L3-L4] level using a [20] gauge needle with return of [clear] CSF with an opening pressure of [16] cm water.   [place] ] ml of CSF were obtained for laboratory studies.  The patient tolerated the procedure well and there were no apparent complications. IMPRESSION: [Successful lumbar puncture.]

## 2012-08-26 NOTE — ED Notes (Signed)
Pt is HIV +

## 2012-08-26 NOTE — H&P (Signed)
[facility] History and Physical  Cardell Rachel ZOX:[id number] DOB: [birth year] DOA: 08/26/2012  Referring physician: ED PCP: Provider Not In System  Specialists: None  Chief Complaint: Headache  HPI: Kenneth [name] is a 56 y.o. male with h/o HIV positive (CD4 count in Jan was >1000), aseptic meningitis 1 year ago requiring intubation organism unclear, who presents with c/o headache, stiff neck, meningismus, onset 1 day ago.  Pain located in neck, back, and head.  Worse when he tries to flex his neck, better at rest.  CSF studies done in the ED demonstrated glucose 64, protien 47, WBC 7 (monos and lymphs), RBC 1.  Hospitalist has been asked to admit for meningitis.  Review of Systems: no fever, chills, emesis, diarrhea, cough, 12 systems reviewed and otherwise negative.  Past Medical History  Diagnosis Date  . HIV positive   . Hypertension   . Meningitis   . Bronchitis   . Coronary artery disease    Past Surgical History  Procedure Date  . Abdominal surgery   . Back surgery   . Appendectomy   . Elbow surgery    Social History:  reports that he has been smoking Cigarettes.  He has a 16.5 pack-year smoking history. He has never used smokeless tobacco. He reports that he does not drink alcohol or use illicit drugs.   Allergies  Allergen Reactions  . Flexeril (Cyclobenzaprine Hcl) Shortness Of Breath and Rash  . Ibuprofen Shortness Of Breath and Rash  . Naproxen Shortness Of Breath and Rash  . Tramadol Shortness Of Breath and Rash    History reviewed. No pertinent family history. No sick contacts in family.  Prior to Admission medications   Medication Sig Start Date End Date Taking? Authorizing Provider  albuterol-ipratropium (COMBIVENT) 18-103 MCG/ACT inhaler Inhale 2 puffs into the lungs every 6 (six) hours as needed.   Yes Historical Provider, MD  metoprolol tartrate (LOPRESSOR) 25 MG tablet Take 25 mg by mouth 2 (two) times daily.   Yes Historical Provider, MD   Multiple Vitamin (MULITIVITAMIN WITH MINERALS) TABS Take 1 tablet by mouth daily.   Yes Historical Provider, MD  ramipril (ALTACE) 1.25 MG capsule Take 1.25 mg by mouth 2 (two) times daily.   Yes Historical Provider, MD  HYDROcodone-acetaminophen (NORCO/VICODIN) 5-325 MG per tablet Take 1 tablet by mouth every 8 (eight) hours as needed for pain. 08/26/12   Gerhard Munch, MD   Physical Exam: Filed Vitals:   08/26/12 1020 08/26/12 1543 08/26/12 2005  BP: 172/113 138/94 168/104  Pulse: 63 58 60  Temp: 97.8 F (36.6 C) 98.4 F (36.9 C)   TempSrc: Oral Oral   Resp: 27 10 16   SpO2: 96% 95% 95%    General:  NAD, resting comfortably in bed, mildly ill appearing Eyes: PEERLA EOMI ENT: mucous membranes moist Neck: positive for meningismus with resistance to flection, w/o JVD Cardiovascular: RRR w/o MRG Respiratory: CTA B Abdomen: soft, nt, nd, bs+ Skin: no rash nor lesion Musculoskeletal: MAE, full ROM all 4 extremities Psychiatric: normal tone and affect Neurologic: AAOx3, grossly non-focal  Labs on Admission:  Basic Metabolic Panel:  Lab 08/26/12 8119  NA 137  K 3.3*  CL 100  CO2 26  GLUCOSE 78  BUN 13  CREATININE 0.78  CALCIUM 9.7  MG --  PHOS --   Liver Function Tests:  Lab 08/26/12 1150  AST 15  ALT 8  ALKPHOS 98  BILITOT 0.2*  PROT 8.5*  ALBUMIN 3.8    Lab 08/26/12 1150  LIPASE 16  AMYLASE --   No results found for this basename: AMMONIA:5 in the last 168 hours CBC:  Lab 08/26/12 1150  WBC 6.6  NEUTROABS 3.9  HGB 15.0  HCT 44.8  MCV 81.5  PLT 320   Cardiac Enzymes:  Lab 08/26/12 1150  CKTOTAL --  CKMB --  CKMBINDEX --  TROPONINI <0.30    BNP (last 3 results) No results found for this basename: PROBNP:3 in the last 8760 hours CBG: No results found for this basename: GLUCAP:5 in the last 168 hours  Radiological Exams on Admission: Ct Head Wo Contrast  08/26/2012  *RADIOLOGY REPORT*  Clinical Data: Headache.  HIV.  Rule out meningitis   CT HEAD WITHOUT CONTRAST  Technique:  Contiguous axial images were obtained from the base of the skull through the vertex without contrast.  Comparison: None  Findings: Ventricle size is normal.  Negative for intracranial hemorrhage.  Negative for infarct mass or edema.  No evidence of tumor or infection.  Calvarium is intact.  Paranasal sinuses are clear.  Left parietal dermal cyst compatible with a sebaceous cyst.  IMPRESSION: No acute intracranial abnormality.   Original Report Authenticated By: Janeece Riggers, M.D.    Dg Chest Port 1 View  08/26/2012  *RADIOLOGY REPORT*  Clinical Data: Chest pain with shortness of breath and weakness.  PORTABLE CHEST - 1 VIEW  Comparison: Abdominal CT 06/04/2011.  Findings: 1124 hours.  There are lower lung volumes with similar bibasilar fibrotic changes compared with the prior abdominal CT. Upper lobe lucencies suggest emphysema.  No superimposed airspace disease, pleural effusion or pneumothorax is identified.  Heart size and mediastinal contours are stable.  Telemetry leads overlie the chest.  IMPRESSION: Chronic lung disease with suspected emphysema and basilar fibrosis, similar to prior abdominal CT.  No definite acute findings.   Original Report Authenticated By: Carey Bullocks, M.D.    Dg Lumbar Puncture Fluoro Guide  08/26/2012  *RADIOLOGY REPORT*  Clinical Data:  HIV , neck pain, headache, concern for meningitis.  DIAGNOSTIC LUMBAR PUNCTURE UNDER FLUOROSCOPIC GUIDANCE  Fluoroscopy time:  1.0 minutes.  Technique:  Informed consent was obtained from the patient prior to the procedure, including potential complications of headache, allergy, and pain.   With the patient prone, the lower back was prepped with Betadine.  1% Lidocaine was used for local anesthesia. Lumbar puncture was performed at the L3-L4 level using a 20 gauge needle with return of clear CSF with an opening pressure of 16 cm water.   9  ml of CSF were obtained for laboratory studies.  The patient  tolerated the procedure well and there were no apparent complications.  IMPRESSION: Successful lumbar puncture.   Original Report Authenticated By: Genevive Bi, M.D.     EKG: Independently reviewed.  Assessment/Plan Principal Problem:  *Meningitis Active Problems:  HIV positive   1. Meningitis - spoke with Dr. Luciana Axe, plan to put patient on acyclovir empirically for suspicion of mollaret's meningitis (history would be compatible with initial HSV infection with the severe episode 1 year ago and this being a much less severe recurrent episode as seen with recurrent HSV meningitis).  Checking HSV PCR as well as antibodies to 1 and 2.  Dr. Luciana Axe agreed no reason to keep patient on isolation nor initiate emperic treatment of bacterial meningitis given his CSF findings.  Likewise several other causes of meningitis are also unlikely given CSF findings (CNS TB, cryptococcus, etc)  DDx at this point includes HIV meningitis vs other viral meningitis.  2. HSV positive - not on antiretroviral's currently, checking CD4 count but very likely that it is still good as he was over 1000 back in Jan of this year.  Also checking HIV viral load.  Informal consult: Spoke with Dr. Luciana Axe of ID on phone: plan as above.  Code Status: Full Code (must indicate code status--if unknown or must be presumed, indicate so) Family Communication: Spoke with wife who was in room per patient wife knows his status and we may communicate with her. (indicate person spoken with, if applicable, with phone number if by telephone) Disposition Plan: Admit to inpatient (indicate anticipated LOS)  Time spent: 70 min  GARDNER, JARED M. [facility] Pager [phone]  If 7PM-7AM, please contact night-coverage [email or web address] Password [facility] 08/26/2012, 8:49 PM

## 2012-08-26 NOTE — ED Provider Notes (Signed)
8:05 PM CSF results reviewed as well as chart.  Pt with hx of aseptic meningitis in 2012- treated in [place].  Pt states his neck pain feels similar to that episode of meningitis.  Gram stain shows no organisms.  Some WBCs present- monos and lymphs.  7 WBCs in tube 4 and 1 RBC.  D/w [facility] who agrees to admit patient.  Will hold on antibiotics for now.  They will likely start acyclovir- he states he is coming to the ED now to discuss with the patient and arrange for admission.    Ethelda Chick, MD 08/26/12 2006

## 2012-08-26 NOTE — Progress Notes (Signed)
ANTIBIOTIC CONSULT NOTE - INITIAL  Pharmacy Consult for IV Acyclovir Indication: R/O HSV Meningitis  Allergies  Allergen Reactions  . Flexeril (Cyclobenzaprine Hcl) Shortness Of Breath and Rash  . Ibuprofen Shortness Of Breath and Rash  . Naproxen Shortness Of Breath and Rash  . Tramadol Shortness Of Breath and Rash    Patient Measurements: Height: 5\' 9"  (175.3 cm) Weight: 160 lb (72.576 kg) IBW/kg (Calculated) : 70.7   Vital Signs: Temp: 98.4 F (36.9 C) (12/09 1543) Temp src: Oral (12/09 1543) BP: 168/104 mmHg (12/09 2005) Pulse Rate: 60  (12/09 2005) Intake/Output from previous day:   Intake/Output from this shift:    Labs:  Basename 08/26/12 1150  WBC 6.6  HGB 15.0  PLT 320  LABCREA --  CREATININE 0.78   Estimated Creatinine Clearance: 101.9 ml/min (by C-G formula based on Cr of 0.78). No results found for this basename: VANCOTROUGH:2,VANCOPEAK:2,VANCORANDOM:2,GENTTROUGH:2,GENTPEAK:2,GENTRANDOM:2,TOBRATROUGH:2,TOBRAPEAK:2,TOBRARND:2,AMIKACINPEAK:2,AMIKACINTROU:2,AMIKACIN:2, in the last 72 hours   Microbiology: Recent Results (from the past 720 hour(s))  GRAM STAIN     Status: Normal   Collection Time   08/26/12  2:15 PM      Component Value Range Status Comment   Specimen Description CSF   Final    Special Requests Immunocompromised   Final    Gram Stain     Final    Value: WBC PRESENT, PREDOMINANTLY MONONUCLEAR     NO ORGANISMS SEEN     Gram Stain Report Called to,Read Back By and Verified With: [id number]   Report Status 08/26/2012 FINAL   Final     Medical History: Past Medical History  Diagnosis Date  . HIV positive   . Hypertension   . Meningitis   . Bronchitis   . Coronary artery disease     Medications:  Anti-infectives    None     Assessment: 56 yo M admitted 12/9 CC: headache, now to start IV acyclovir for r/o HSV meningitis (suspicion for mollaret's meningitis per ID). PMH significant for HIV and aspeptic  meningitis 1 year ago. CrCl > 162ml/min. IBW 70.7kg.  Plan:  Acylcovir 10mg /kg IV q8h (700mg  IV q8h)  Darrol Angel, PharmD Pager: [phone] 08/26/2012,9:06 PM

## 2012-08-26 NOTE — ED Notes (Signed)
MD at bedside for eval.

## 2012-08-26 NOTE — ED Notes (Signed)
Patient transported to Ultrasound 

## 2012-08-27 DIAGNOSIS — B20 Human immunodeficiency virus [HIV] disease: Secondary | ICD-10-CM

## 2012-08-27 DIAGNOSIS — R739 Hyperglycemia, unspecified: Secondary | ICD-10-CM | POA: Diagnosis present

## 2012-08-27 DIAGNOSIS — I10 Essential (primary) hypertension: Secondary | ICD-10-CM

## 2012-08-27 DIAGNOSIS — A879 Viral meningitis, unspecified: Secondary | ICD-10-CM

## 2012-08-27 DIAGNOSIS — J45909 Unspecified asthma, uncomplicated: Secondary | ICD-10-CM

## 2012-08-27 DIAGNOSIS — E876 Hypokalemia: Secondary | ICD-10-CM | POA: Diagnosis present

## 2012-08-27 HISTORY — DX: Unspecified asthma, uncomplicated: J45.909

## 2012-08-27 HISTORY — DX: Essential (primary) hypertension: I10

## 2012-08-27 LAB — CBC
HCT: 40 % (ref 39.0–52.0)
Hemoglobin: 13 g/dL (ref 13.0–17.0)
MCH: 26.8 pg (ref 26.0–34.0)
MCHC: 32.5 g/dL (ref 30.0–36.0)
MCV: 82.5 fL (ref 78.0–100.0)
Platelets: 253 10*3/uL (ref 150–400)
RBC: 4.85 MIL/uL (ref 4.22–5.81)
RDW: 15.6 % — ABNORMAL HIGH (ref 11.5–15.5)
WBC: 7.8 10*3/uL (ref 4.0–10.5)

## 2012-08-27 LAB — GLUCOSE, CAPILLARY: Glucose-Capillary: 120 mg/dL — ABNORMAL HIGH (ref 70–99)

## 2012-08-27 LAB — BASIC METABOLIC PANEL
BUN: 13 mg/dL (ref 6–23)
CO2: 26 mEq/L (ref 19–32)
Calcium: 8.9 mg/dL (ref 8.4–10.5)
Chloride: 105 mEq/L (ref 96–112)
Creatinine, Ser: 0.81 mg/dL (ref 0.50–1.35)
GFR calc Af Amer: >90 mL/min (ref >90)
GFR calc non Af Amer: >90 mL/min (ref >90)
Glucose, Bld: 110 mg/dL — ABNORMAL HIGH (ref 70–99)
Potassium: 3.3 mEq/L — ABNORMAL LOW (ref 3.5–5.1)
Sodium: 138 mEq/L (ref 135–145)

## 2012-08-27 LAB — HSV 1 ANTIBODY, IGG: HSV 1 Glycoprotein G Ab, IgG: 3.77 IV — ABNORMAL HIGH

## 2012-08-27 LAB — T-HELPER CELLS (CD4) COUNT (NOT AT ARMC)
CD4 % Helper T Cell: 38 % (ref 33–55)
CD4 T Cell Abs: 760 uL (ref 400–2700)

## 2012-08-27 LAB — MAGNESIUM: Magnesium: 1.7 mg/dL (ref 1.5–2.5)

## 2012-08-27 LAB — HIV-1 RNA QUANT-NO REFLEX-BLD
HIV 1 RNA Quant: 73 copies/mL — ABNORMAL HIGH (ref <20)
HIV-1 RNA Quant, Log: 1.86 {Log} — ABNORMAL HIGH (ref <1.30)

## 2012-08-27 LAB — TROPONIN I
Troponin I: <0.3 ng/mL (ref <0.30)
Troponin I: <0.3 ng/mL (ref <0.30)

## 2012-08-27 LAB — HSV 2 ANTIBODY, IGG: HSV 2 Glycoprotein G Ab, IgG: 9.87 IV — ABNORMAL HIGH

## 2012-08-27 MED ORDER — METOPROLOL TARTRATE 25 MG PO TABS
25.0000 mg | ORAL_TABLET | Freq: Two times a day (BID) | ORAL | Status: DC
  Administered 2012-08-27 – 2012-08-28 (×3): 25 mg via ORAL
  Filled 2012-08-27 (×5): qty 1

## 2012-08-27 MED ORDER — DIPHENHYDRAMINE HCL 25 MG PO CAPS
25.0000 mg | ORAL_CAPSULE | Freq: Four times a day (QID) | ORAL | Status: DC | PRN
  Administered 2012-08-27 – 2012-08-28 (×5): 25 mg via ORAL
  Filled 2012-08-27 (×6): qty 1

## 2012-08-27 MED ORDER — HYDRALAZINE HCL 20 MG/ML IJ SOLN
5.0000 mg | Freq: Four times a day (QID) | INTRAMUSCULAR | Status: DC | PRN
  Administered 2012-08-28 – 2012-08-29 (×2): 5 mg via INTRAVENOUS
  Filled 2012-08-27 (×2): qty 1

## 2012-08-27 MED ORDER — POTASSIUM CHLORIDE CRYS ER 20 MEQ PO TBCR
20.0000 meq | EXTENDED_RELEASE_TABLET | Freq: Once | ORAL | Status: AC
  Administered 2012-08-27: 20 meq via ORAL
  Filled 2012-08-27: qty 1

## 2012-08-27 MED ORDER — METOPROLOL TARTRATE 12.5 MG HALF TABLET
12.5000 mg | ORAL_TABLET | Freq: Two times a day (BID) | ORAL | Status: DC
  Administered 2012-08-27: 12.5 mg via ORAL
  Filled 2012-08-27 (×2): qty 1

## 2012-08-27 NOTE — Progress Notes (Signed)
Subjective: Patient seen and examined, admitted with lymphocytic meningitis, started on IV acyclovir.Patient is HIV positive with normal CD4 counts.  Objective: Vital signs in last 24 hours: Temp:  [97.9 F (36.6 C)-98.4 F (36.9 C)] 97.9 F (36.6 C) (12/10 1503) Pulse Rate:  [53-63] 53  (12/10 1503) Resp:  [10-20] 18  (12/10 1503) BP: (132-175)/(81-104) 162/95 mmHg (12/10 1503) SpO2:  [91 %-96 %] 91 % (12/10 1503) Weight:  [71.8 kg (158 lb 4.6 oz)-72.576 kg (160 lb)] 71.8 kg (158 lb 4.6 oz) (12/09 2142) Weight change:  Last BM Date: 08/26/12  Consults: ID  Procedures: Lumbar puncture  Intake/Output from previous day: 12/09 0701 - 12/10 0700 In: 2790.5 [I.V.:2562.5; IV Piggyback:228] Out: 575 [Urine:575] Total I/O In: -  Out: 525 [Urine:525]   Physical Exam: Head: Normocephalic, atraumatic.  Eyes: No signs of jaundice, EOMI Nose: Mucous membranes dry.  Neck: supple,No deformities, masses, or tenderness noted. Lungs: Normal respiratory effort. B/L Clear to auscultation, no crackles or wheezes.  Heart: Regular RR. S1 and S2 normal  Abdomen: BS normoactive. Soft, Nondistended, non-tender.  Extremities: No pretibial edema, no erythema   Lab Results: Basic Metabolic Panel:  Basename 08/27/12 0512 08/26/12 2212 08/26/12 1150  NA 138 -- 137  K 3.3* -- 3.3*  CL 105 -- 100  CO2 26 -- 26  GLUCOSE 110* -- 78  BUN 13 -- 13  CREATININE 0.81 0.82 --  CALCIUM 8.9 -- 9.7  MG 1.7 -- --  PHOS -- -- --   Liver Function Tests:  Basename 08/26/12 1150  AST 15  ALT 8  ALKPHOS 98  BILITOT 0.2*  PROT 8.5*  ALBUMIN 3.8    Basename 08/26/12 1150  LIPASE 16  AMYLASE --   No results found for this basename: AMMONIA:2 in the last 72 hours CBC:  Basename 08/27/12 0512 08/26/12 2212 08/26/12 1150  WBC 7.8 7.5 --  NEUTROABS -- -- 3.9  HGB 13.0 13.9 --  HCT 40.0 42.2 --  MCV 82.5 82.3 --  PLT 253 278 --   Cardiac Enzymes:  Basename 08/27/12 1018 08/27/12 0512  08/26/12 2212  CKTOTAL -- -- --  CKMB -- -- --  CKMBINDEX -- -- --  TROPONINI <0.30 <0.30 <0.30   BNP: No results found for this basename: PROBNP:3 in the last 72 hours D-Dimer: No results found for this basename: DDIMER:2 in the last 72 hours CBG:  Basename 08/27/12 0737 08/26/12 2147  GLUCAP 120* 90     Alcohol Level: No results found for this basename: ETH:2 in the last 72 hours Urinalysis:  Basename 08/26/12 1200  COLORURINE YELLOW  LABSPEC 1.016  PHURINE 6.0  GLUCOSEU NEGATIVE  HGBUR NEGATIVE  BILIRUBINUR NEGATIVE  KETONESUR NEGATIVE  PROTEINUR NEGATIVE  UROBILINOGEN 0.2  NITRITE NEGATIVE  LEUKOCYTESUR NEGATIVE   Misc. Labs:  Recent Results (from the past 240 hour(s))  GRAM STAIN     Status: Normal   Collection Time   08/26/12  2:15 PM      Component Value Range Status Comment   Specimen Description CSF   Final    Special Requests Immunocompromised   Final    Gram Stain     Final    Value: WBC PRESENT, PREDOMINANTLY MONONUCLEAR     NO ORGANISMS SEEN     Gram Stain Report Called to,Read Back By and Verified With: [id number]   Report Status 08/26/2012 FINAL   Final     Studies/Results: Ct Head Wo Contrast  08/26/2012  *RADIOLOGY REPORT*  Clinical  Data: Headache.  HIV.  Rule out meningitis  CT HEAD WITHOUT CONTRAST  Technique:  Contiguous axial images were obtained from the base of the skull through the vertex without contrast.  Comparison: None  Findings: Ventricle size is normal.  Negative for intracranial hemorrhage.  Negative for infarct mass or edema.  No evidence of tumor or infection.  Calvarium is intact.  Paranasal sinuses are clear.  Left parietal dermal cyst compatible with a sebaceous cyst.  IMPRESSION: No acute intracranial abnormality.   Original Report Authenticated By: Janeece Riggers, M.D.    Dg Chest Port 1 View  08/26/2012  *RADIOLOGY REPORT*  Clinical Data: Chest pain with shortness of breath and weakness.  PORTABLE CHEST - 1  VIEW  Comparison: Abdominal CT 06/04/2011.  Findings: 1124 hours.  There are lower lung volumes with similar bibasilar fibrotic changes compared with the prior abdominal CT. Upper lobe lucencies suggest emphysema.  No superimposed airspace disease, pleural effusion or pneumothorax is identified.  Heart size and mediastinal contours are stable.  Telemetry leads overlie the chest.  IMPRESSION: Chronic lung disease with suspected emphysema and basilar fibrosis, similar to prior abdominal CT.  No definite acute findings.   Original Report Authenticated By: Carey Bullocks, M.D.    Dg Lumbar Puncture Fluoro Guide  08/26/2012  *RADIOLOGY REPORT*  Clinical Data:  HIV , neck pain, headache, concern for meningitis.  DIAGNOSTIC LUMBAR PUNCTURE UNDER FLUOROSCOPIC GUIDANCE  Fluoroscopy time:  1.0 minutes.  Technique:  Informed consent was obtained from the patient prior to the procedure, including potential complications of headache, allergy, and pain.   With the patient prone, the lower back was prepped with Betadine.  1% Lidocaine was used for local anesthesia. Lumbar puncture was performed at the L3-L4 level using a 20 gauge needle with return of clear CSF with an opening pressure of 16 cm water.   9  ml of CSF were obtained for laboratory studies.  The patient tolerated the procedure well and there were no apparent complications.  IMPRESSION: Successful lumbar puncture.   Original Report Authenticated By: Genevive Bi, M.D.     Medications: Scheduled Meds:   . acyclovir  700 mg Intravenous Q8H  . heparin  5,000 Units Subcutaneous Q8H  . metoprolol tartrate  12.5 mg Oral BID  . multivitamin with minerals  1 tablet Oral Daily  . [COMPLETED] potassium chloride  20 mEq Oral Once  . ramipril  1.25 mg Oral BID  . sodium chloride  3 mL Intravenous Q12H  . sodium chloride  3 mL Intravenous Q12H  . [DISCONTINUED] metoprolol tartrate  25 mg Oral BID   Continuous Infusions:   . sodium chloride 1,000 mL (08/27/12  0739)   PRN Meds:.sodium chloride, diphenhydrAMINE, hydrALAZINE, HYDROmorphone (DILAUDID) injection, Ipratropium-Albuterol, sodium chloride, [DISCONTINUED] albuterol-ipratropium, [DISCONTINUED]  HYDROmorphone (DILAUDID) injection    Principal Problem:  *Benign recurrent aseptic meningitis Active Problems:  HIV positive  HTN (hypertension)  Asthma  Hyperglycemia  Hypokalemia  Aseptic meningitis Will continue with acyclovir HSV PCR is pending ID consult  HIV positive No HAART at this time Will follow with ID as outpatient.  Hypertension Continue Metoprolol, altace.  Hypokalemia Replace potassium Check BMP in am.  DVT Prophylaxis SCD   LOS: 1 day  Assessment/Plan: [facility] Pager: [phone] 08/27/2012, 3:30 PM

## 2012-08-27 NOTE — Progress Notes (Signed)
Patient's admit telemetry strip showed 1st degree HB with PR of .24. This is a change from EKG taken in ED which showed NSR. On-call text paged, order placed to obtain EKG. EKG obtained, 1st degree HB shown with PR of .224, on-call text paged with results. No new orders at this time.

## 2012-08-27 NOTE — Consult Note (Signed)
[facility]          Day 1 acyclovir       Reason for Consult: Mild lymphocytic meningitis    Referring Physician: Dr. Mauro Kaufmann  Principal Problem:  *Benign recurrent aseptic meningitis Active Problems:  HIV positive  HTN (hypertension)  Asthma  Hyperglycemia  Hypokalemia      . acyclovir  700 mg Intravenous Q8H  . heparin  5,000 Units Subcutaneous Q8H  . metoprolol tartrate  12.5 mg Oral BID  . multivitamin with minerals  1 tablet Oral Daily  . [COMPLETED] potassium chloride  20 mEq Oral Once  . ramipril  1.25 mg Oral BID  . sodium chloride  3 mL Intravenous Q12H  . sodium chloride  3 mL Intravenous Q12H  . [DISCONTINUED] metoprolol tartrate  25 mg Oral BID    Recommendations: 1. Continue acyclovir pending CSF herpes simplex PCR 2. [place] to his hospitalization late last year for meningitis and his records pertaining to his HIV infection   Assessment: He has very mild lymphocytic meningitis and is already feeling better. If he indeed had meningitis last year there is a possibility that he has recurrent herpes simplex meningitis. I will continue empiric IV acyclovir pending the herpes simplex PCR and review of his records from [facility].  He has HIV infection that appears to have been asymptomatic with normal CD4 counts. He will be a candidate to reconsider antiretroviral therapy but that can wait until he follows up with [place] in clinic after his acute illness resolves.    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 He became nervous and came to the hospital last night where a spinal tap showed normal opening pressure with 7 white blood cells, predominantly lymphs and monocytes and mild elevation of his protein  to 47. Initial Gram stain and culture are negative. He was started on empiric IV acyclovir year for the possibility of recurrent herpes simplex meningitis, often referred to as Mollarets meningitis.  He was diagnosed with HIV infection in the early 1990s but was told that his blood counts were always very good and he did not need to start therapy. He did however try Atripla early this year when still living in [facility]. He suffered hallucinations and his physician there told him to stop taking it after several weeks. He moved here recently and has not established medical care locally.  Last December he developed abdominal pain and was hospitalized in [facility]. The diagnosis was felt to be intussusception and he underwent surgery. All he knows is that his appendix was removed at that time. His wife states that he was having severe pain and was hallucinating around the time of his acute illness. Following surgery he had some improvement but had to be readmitted to the hospital emergently. He underwent lumbar puncture and was told that he has meningitis and was septic. He was moved to the intensive care unit and was hospitalized for about one month. They do not know what type of meningitis he had.  He is a long-term smoker and has been told in the past that he had a weak heart. He has not been on any medications since he moved here earlier this year. He is not aware  of having had any fever, chills, or sweats recently. His appetite has been good and his weight has been stable.  He recently sustained a fall down some stairs and was seen in the emergency department on November 30. He does not think that that is the cause of his recent headache and neck pain as that only started a few days ago.   Review of Systems: Pertinent items are noted in HPI.  Past Medical History  Diagnosis Date  . HIV positive   . Hypertension   . Meningitis   . Bronchitis   . Coronary artery  disease     History  Substance Use Topics  . Smoking status: Current Every Day Smoker -- 0.5 packs/day for 33 years    Types: Cigarettes  . Smokeless tobacco: Never Used  . Alcohol Use: No    History reviewed. No pertinent family history. Allergies  Allergen Reactions  . Flexeril (Cyclobenzaprine Hcl) Shortness Of Breath and Rash  . Ibuprofen Shortness Of Breath and Rash  . Naproxen Shortness Of Breath and Rash  . Tramadol Shortness Of Breath and Rash    OBJECTIVE: Blood pressure 132/81, pulse 60, temperature 98 F (36.7 C), temperature source Oral, resp. rate 18, height 5\' 9"  (1.753 m), weight 71.8 kg (158 lb 4.6 oz), SpO2 95.00%. General: He is alert, conversant and in no distress Neck: Slight stiffness and discomfort with flexion Skin: No rash Oral: No oropharyngeal lesions Lymph nodes: Shotty bilateral axillary adenopathy Lungs: Clear Cor: Regular S1 and S2 with no murmurs Abdomen: Healed midline incision. Soft and nontender  CD4 T Cell Abs (cmm)  Date Value  08/26/2012 760     Microbiology: Recent Results (from the past 240 hour(s))  GRAM STAIN     Status: Normal   Collection Time   08/26/12  2:15 PM      Component Value Range Status Comment   Specimen Description CSF   Final    Special Requests Immunocompromised   Final    Gram Stain     Final    Value: WBC PRESENT, PREDOMINANTLY MONONUCLEAR     NO ORGANISMS SEEN     Gram Stain Report Called to,Read Back By and Verified With: [id number]   Report Status 08/26/2012 FINAL   Final     Cliffton Asters, MD [facility] [phone] pager   [phone] cell 08/27/2012, 2:43 PM

## 2012-08-28 DIAGNOSIS — E876 Hypokalemia: Secondary | ICD-10-CM

## 2012-08-28 LAB — BASIC METABOLIC PANEL
BUN: 10 mg/dL (ref 6–23)
CO2: 24 mEq/L (ref 19–32)
Calcium: 9 mg/dL (ref 8.4–10.5)
Chloride: 106 mEq/L (ref 96–112)
Creatinine, Ser: 0.86 mg/dL (ref 0.50–1.35)
GFR calc Af Amer: >90 mL/min (ref >90)
GFR calc non Af Amer: >90 mL/min (ref >90)
Glucose, Bld: 92 mg/dL (ref 70–99)
Potassium: 3.6 mEq/L (ref 3.5–5.1)
Sodium: 137 mEq/L (ref 135–145)

## 2012-08-28 LAB — GLUCOSE, CAPILLARY: Glucose-Capillary: 96 mg/dL (ref 70–99)

## 2012-08-28 MED ORDER — VALACYCLOVIR HCL 500 MG PO TABS
1000.0000 mg | ORAL_TABLET | Freq: Three times a day (TID) | ORAL | Status: DC
  Administered 2012-08-28 – 2012-08-29 (×3): 1000 mg via ORAL
  Filled 2012-08-28 (×5): qty 2

## 2012-08-28 NOTE — Progress Notes (Signed)
Patient ID: Kenneth [name], male   DOB: January 16, 1956, 56 y.o.   MRN: [id number]    [facility]    Date of Admission:  08/26/2012           Day 2 herpes therapy Principal Problem:  *Benign recurrent aseptic meningitis Active Problems:  HIV positive  HTN (hypertension)  Asthma  Hyperglycemia  Hypokalemia      . heparin  5,000 Units Subcutaneous Q8H  . metoprolol tartrate  25 mg Oral BID  . multivitamin with minerals  1 tablet Oral Daily  . [COMPLETED] potassium chloride  20 mEq Oral Once  . ramipril  1.25 mg Oral BID  . sodium chloride  3 mL Intravenous Q12H  . valACYclovir  1,000 mg Oral TID  . [DISCONTINUED] acyclovir  700 mg Intravenous Q8H  . [DISCONTINUED] sodium chloride  3 mL Intravenous Q12H    Subjective: He is still having intermittent 7/10 headache and neck pain but is feeling much better overall. He has a good appetite and has not had any nausea or vomiting.  Objective: Temp:  [97.6 F (36.4 C)-98.4 F (36.9 C)] 98.1 F (36.7 C) (12/11 0552) Pulse Rate:  [56-63] 63  (12/11 0552) Resp:  [18-20] 20  (12/11 0552) BP: (157-163)/(83-97) 163/97 mmHg (12/11 0552) SpO2:  [93 %-95 %] 94 % (12/11 0552)  General: He is alert and comfortable sitting up in his room Neck: Slight stiffness and pain with flexion Skin: No rash Lungs: Clear Cor: Regular S1 and S2 no murmurs  Abdomen: Soft and nontender  Lab Results Lab Results  Component Value Date   WBC 7.8 08/27/2012   HGB 13.0 08/27/2012   HCT 40.0 08/27/2012   MCV 82.5 08/27/2012   PLT 253 08/27/2012    Lab Results  Component Value Date   CREATININE 0.86 08/28/2012   BUN 10 08/28/2012   NA 137 08/28/2012   K 3.6 08/28/2012   CL 106 08/28/2012   CO2 24 08/28/2012    Lab Results  Component Value Date   ALT 8 08/26/2012   AST 15 08/26/2012   ALKPHOS 98 08/26/2012   BILITOT 0.2* 08/26/2012      Microbiology: Recent Results (from the past 240 hour(s))  CULTURE, BLOOD (ROUTINE X 2)      Status: Normal (Preliminary result)   Collection Time   08/26/12 11:44 AM      Component Value Range Status Comment   Specimen Description BLOOD LEFT ANTECUBITAL   Final    Special Requests BOTTLES DRAWN AEROBIC AND ANAEROBIC   Final    Culture  Setup Time 08/26/2012 16:17   Final    Culture     Final    Value:        BLOOD CULTURE RECEIVED NO GROWTH TO DATE CULTURE WILL BE HELD FOR 5 DAYS BEFORE ISSUING A FINAL NEGATIVE REPORT   Report Status PENDING   Incomplete   CULTURE, BLOOD (ROUTINE X 2)     Status: Normal (Preliminary result)   Collection Time   08/26/12 11:51 AM      Component Value Range Status Comment   Specimen Description BLOOD LEFT ARM   Final    Special Requests BOTTLES DRAWN AEROBIC AND ANAEROBIC   Final    Culture  Setup Time 08/26/2012 16:18   Final    Culture     Final    Value:        BLOOD CULTURE RECEIVED NO GROWTH TO DATE CULTURE WILL BE  HELD FOR 5 DAYS BEFORE ISSUING A FINAL NEGATIVE REPORT   Report Status PENDING   Incomplete   URINE CULTURE     Status: Normal (Preliminary result)   Collection Time   08/26/12 12:00 PM      Component Value Range Status Comment   Specimen Description URINE, CATHETERIZED   Final    Special Requests NONE   Final    Culture  Setup Time 08/26/2012 19:08   Final    Colony Count 10,000 COLONIES/ML   Final    Culture GRAM POSITIVE COCCI   Final    Report Status PENDING   Incomplete   CSF CULTURE     Status: Normal (Preliminary result)   Collection Time   08/26/12  2:15 PM      Component Value Range Status Comment   Specimen Description CSF   Final    Special Requests Immunocompromised   Final    Gram Stain     Final    Value: WBC PRESENT, PREDOMINANTLY MONONUCLEAR     NO ORGANISMS SEEN     Performed by [facility] Gram Stain Report Called to,Read Back By and Verified With: Gram Stain Report Called to,Read Back By and Verified With:  M. MENGINNIS @17 :18 ON 08/26/12 BY MURPHY D   Culture NO GROWTH 1 DAY   Final     Report Status PENDING   Incomplete   GRAM STAIN     Status: Normal   Collection Time   08/26/12  2:15 PM      Component Value Range Status Comment   Specimen Description CSF   Final    Special Requests Immunocompromised   Final    Gram Stain     Final    Value: WBC PRESENT, PREDOMINANTLY MONONUCLEAR     NO ORGANISMS SEEN     Gram Stain Report Called to,Read Back By and Verified With: M.[email or web address]/[id number]   Report Status 08/26/2012 FINAL   Final    HIV 1 RNA Quant (copies/mL)  Date Value  08/26/2012 73*     CD4 T Cell Abs (cmm)  Date Value  08/26/2012 760     Assessment: 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 Unfortunately the herpes simplex PCR that was ordered was never set up and the remaining CSF that is in the lab was not processed in a way that would allow the test to be done on the remaining sample. 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 We did receive some faxed of medical records from [facility] but they do not include anything about his hospitalizations late last year. Given his limitations I would recommend continuing treatment for possible herpes simplex infection with oral Valtrex 1000 mg 3 times daily to complete 10 days of total therapy.  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 I will arrange followup in our clinic for ongoing management of his HIV infection.  Plan: 1. Agree with conversion to oral Valtrex (he will probably need assistance obtaining Valtrex) 2. Recommend discharge once headache has improved or is easily controllable with oral therapy 3. I will arrange followup in our ID clinic for ongoing management of his HIV infection  Cliffton Asters, MD [facility] [phone] pager   [phone] cell 08/28/2012, 3:55  PM

## 2012-08-28 NOTE — Progress Notes (Signed)
[facility] PROGRESS NOTE  Kenneth [name] ZOX:[id number] DOB: 11-Oct-1955 DOA: 08/26/2012 PCP: Provider Not In System  Assessment/Plan: Aseptic meningitis  Transition IV acyclovir to oral valacyclovir, after discussion with ID.  HSV PCR is pending. ID, Dr. Blair Dolphin input appreciated.  Patient had a lumbar puncture on 08/26/2012 which showed 7 CSF from tube 4.  CSF culture showed WBC predominantly mononuclear, no organisms seen.  HIV positive  Not on HAART at this time. Will follow with ID as outpatient.   Hypertension  Stable.  Continue Metoprolol and ramipril.  Hypokalemia  Resolved with replacement.  DVT Prophylaxis  Subcutaneous heparin.  Code Status: Full code. Family Communication: Patient and wife updated at bedside. Disposition Plan: Pending, possible discharge in 24 hours.   Consultants:  ID, Dr. Orvan Falconer  Procedures:  Lumbar puncture  Antibiotics:  Acyclovir from 08/26/2012 till 08/28/2012.  Valacyclovir 08/28/2012 >>  HPI/Subjective: Reports having headaches on changing positions otherwise no headaches with lying down.  No other complaints.  Objective: Filed Vitals:   08/27/12 1503 08/27/12 2207 08/28/12 0253 08/28/12 0552  BP: 162/95 157/83 160/92 163/97  Pulse: 53 58 56 63  Temp: 97.9 F (36.6 C) 98.4 F (36.9 C) 97.6 F (36.4 C) 98.1 F (36.7 C)  TempSrc: Oral Oral Oral Oral  Resp: 18 18 20 20   Height:      Weight:      SpO2: 91% 93% 95% 94%    Intake/Output Summary (Last 24 hours) at 08/28/12 1026 Last data filed at 08/28/12 0844  Gross per 24 hour  Intake 2954.2 ml  Output   2500 ml  Net  454.2 ml   Filed Weights   08/26/12 2055 08/26/12 2142  Weight: 72.576 kg (160 lb) 71.8 kg (158 lb 4.6 oz)    Exam: Physical Exam: General: Awake, Oriented, No acute distress. HEENT: EOMI. Neck: Supple CV: S1 and S2 Lungs: Clear to ascultation bilaterally Abdomen: Soft, Nontender, Nondistended, +bowel sounds. Ext: Good pulses. Trace  edema.  Data Reviewed: Basic Metabolic Panel:  Lab 08/28/12 8119 08/27/12 0512 08/26/12 2212 08/26/12 1150  NA 137 138 -- 137  K 3.6 3.3* -- 3.3*  CL 106 105 -- 100  CO2 24 26 -- 26  GLUCOSE 92 110* -- 78  BUN 10 13 -- 13  CREATININE 0.86 0.81 0.82 0.78  CALCIUM 9.0 8.9 -- 9.7  MG -- 1.7 -- --  PHOS -- -- -- --   Liver Function Tests:  Lab 08/26/12 1150  AST 15  ALT 8  ALKPHOS 98  BILITOT 0.2*  PROT 8.5*  ALBUMIN 3.8    Lab 08/26/12 1150  LIPASE 16  AMYLASE --   No results found for this basename: AMMONIA:5 in the last 168 hours CBC:  Lab 08/27/12 0512 08/26/12 2212 08/26/12 1150  WBC 7.8 7.5 6.6  NEUTROABS -- -- 3.9  HGB 13.0 13.9 15.0  HCT 40.0 42.2 44.8  MCV 82.5 82.3 81.5  PLT 253 278 320   Cardiac Enzymes:  Lab 08/27/12 1018 08/27/12 0512 08/26/12 2212 08/26/12 1150  CKTOTAL -- -- -- --  CKMB -- -- -- --  CKMBINDEX -- -- -- --  TROPONINI <0.30 <0.30 <0.30 <0.30   BNP (last 3 results) No results found for this basename: PROBNP:3 in the last 8760 hours CBG:  Lab 08/28/12 0829 08/27/12 0737 08/26/12 2147  GLUCAP 96 120* 90    Recent Results (from the past 240 hour(s))  CULTURE, BLOOD (ROUTINE X 2)     Status: Normal (Preliminary result)  Collection Time   08/26/12 11:44 AM      Component Value Range Status Comment   Specimen Description BLOOD LEFT ANTECUBITAL   Final    Special Requests BOTTLES DRAWN AEROBIC AND ANAEROBIC   Final    Culture  Setup Time 08/26/2012 16:17   Final    Culture     Final    Value:        BLOOD CULTURE RECEIVED NO GROWTH TO DATE CULTURE WILL BE HELD FOR 5 DAYS BEFORE ISSUING A FINAL NEGATIVE REPORT   Report Status PENDING   Incomplete   CULTURE, BLOOD (ROUTINE X 2)     Status: Normal (Preliminary result)   Collection Time   08/26/12 11:51 AM      Component Value Range Status Comment   Specimen Description BLOOD LEFT ARM   Final    Special Requests BOTTLES DRAWN AEROBIC AND ANAEROBIC   Final    Culture   Setup Time 08/26/2012 16:18   Final    Culture     Final    Value:        BLOOD CULTURE RECEIVED NO GROWTH TO DATE CULTURE WILL BE HELD FOR 5 DAYS BEFORE ISSUING A FINAL NEGATIVE REPORT   Report Status PENDING   Incomplete   URINE CULTURE     Status: Normal (Preliminary result)   Collection Time   08/26/12 12:00 PM      Component Value Range Status Comment   Specimen Description URINE, CATHETERIZED   Final    Special Requests NONE   Final    Culture  Setup Time 08/26/2012 19:08   Final    Colony Count 10,000 COLONIES/ML   Final    Culture GRAM POSITIVE COCCI   Final    Report Status PENDING   Incomplete   CSF CULTURE     Status: Normal (Preliminary result)   Collection Time   08/26/12  2:15 PM      Component Value Range Status Comment   Specimen Description CSF   Final    Special Requests Immunocompromised   Final    Gram Stain     Final    Value: WBC PRESENT, PREDOMINANTLY MONONUCLEAR     NO ORGANISMS SEEN     Performed by [facility] Gram Stain Report Called to,Read Back By and Verified With: Gram Stain Report Called to,Read Back By and Verified With:  M. MENGINNIS @17 :18 ON 08/26/12 BY MURPHY D   Culture NO GROWTH   Final    Report Status PENDING   Incomplete   GRAM STAIN     Status: Normal   Collection Time   08/26/12  2:15 PM      Component Value Range Status Comment   Specimen Description CSF   Final    Special Requests Immunocompromised   Final    Gram Stain     Final    Value: WBC PRESENT, PREDOMINANTLY MONONUCLEAR     NO ORGANISMS SEEN     Gram Stain Report Called to,Read Back By and Verified With: M.[id number]   Report Status 08/26/2012 FINAL   Final      Studies: Ct Head Wo Contrast  08/26/2012  *RADIOLOGY REPORT*  Clinical Data: Headache.  HIV.  Rule out meningitis  CT HEAD WITHOUT CONTRAST  Technique:  Contiguous axial images were obtained from the base of the skull through the vertex without contrast.  Comparison: None  Findings: Ventricle  size is normal.  Negative for intracranial hemorrhage.  Negative for infarct mass or edema.  No evidence of tumor or infection.  Calvarium is intact.  Paranasal sinuses are clear.  Left parietal dermal cyst compatible with a sebaceous cyst.  IMPRESSION: No acute intracranial abnormality.   Original Report Authenticated By: Janeece Riggers, M.D.    Dg Chest Port 1 View  08/26/2012  *RADIOLOGY REPORT*  Clinical Data: Chest pain with shortness of breath and weakness.  PORTABLE CHEST - 1 VIEW  Comparison: Abdominal CT 06/04/2011.  Findings: 1124 hours.  There are lower lung volumes with similar bibasilar fibrotic changes compared with the prior abdominal CT. Upper lobe lucencies suggest emphysema.  No superimposed airspace disease, pleural effusion or pneumothorax is identified.  Heart size and mediastinal contours are stable.  Telemetry leads overlie the chest.  IMPRESSION: Chronic lung disease with suspected emphysema and basilar fibrosis, similar to prior abdominal CT.  No definite acute findings.   Original Report Authenticated By: Carey Bullocks, M.D.    Dg Lumbar Puncture Fluoro Guide  08/26/2012  *RADIOLOGY REPORT*  Clinical Data:  HIV , neck pain, headache, concern for meningitis.  DIAGNOSTIC LUMBAR PUNCTURE UNDER FLUOROSCOPIC GUIDANCE  Fluoroscopy time:  1.0 minutes.  Technique:  Informed consent was obtained from the patient prior to the procedure, including potential complications of headache, allergy, and pain.   With the patient prone, the lower back was prepped with Betadine.  1% Lidocaine was used for local anesthesia. Lumbar puncture was performed at the L3-L4 level using a 20 gauge needle with return of clear CSF with an opening pressure of 16 cm water.   9  ml of CSF were obtained for laboratory studies.  The patient tolerated the procedure well and there were no apparent complications.  IMPRESSION: Successful lumbar puncture.   Original Report Authenticated By: Genevive Bi, M.D.     Scheduled  Meds:   . acyclovir  700 mg Intravenous Q8H  . heparin  5,000 Units Subcutaneous Q8H  . metoprolol tartrate  25 mg Oral BID  . multivitamin with minerals  1 tablet Oral Daily  . [COMPLETED] potassium chloride  20 mEq Oral Once  . ramipril  1.25 mg Oral BID  . sodium chloride  3 mL Intravenous Q12H  . sodium chloride  3 mL Intravenous Q12H  . [DISCONTINUED] metoprolol tartrate  12.5 mg Oral BID   Continuous Infusions:   . sodium chloride 1,000 mL (08/28/12 0307)    Principal Problem:  *Benign recurrent aseptic meningitis Active Problems:  HIV positive  HTN (hypertension)  Asthma  Hyperglycemia  Hypokalemia   Time spent: 20   Armelia Penton A  [facility] Pager [phone]. If 7PM-7AM, please contact night-coverage at [email or web address], password [facility] 08/28/2012, 10:26 AM  LOS: 2 days

## 2012-08-28 NOTE — Progress Notes (Signed)
CARE MANAGEMENT NOTE 08/28/2012  Patient:  Kenneth [name], Kenneth [name]   Account Number:  [id number]  Date Initiated:  08/28/2012  Documentation initiated by:  Inda Castle  Subjective/Objective Assessment:   Request from physician to assist patient in getting a physician in area.     Action/Plan:   Patient moving to area from [place]. Explained process for obatining a primary care physician in the area.  Given number for [employer] as well as directing him to customer service on Medicare card.   Anticipated DC Date:  08/28/2012   Anticipated DC Plan:  HOME/SELF CARE         Choice offered to / List presented to:  C-1 Patient           Status of service:  Completed, signed off Medicare Important Message given?   (If response is "NO", the following Medicare IM given date fields will be blank) Date Medicare IM given:   Date Additional Medicare IM given:    Discharge Disposition:    Per UR Regulation:

## 2012-08-29 LAB — BASIC METABOLIC PANEL
BUN: 14 mg/dL (ref 6–23)
CO2: 29 mEq/L (ref 19–32)
Calcium: 9.7 mg/dL (ref 8.4–10.5)
Chloride: 102 mEq/L (ref 96–112)
Creatinine, Ser: 0.9 mg/dL (ref 0.50–1.35)
GFR calc Af Amer: >90 mL/min (ref >90)
GFR calc non Af Amer: >90 mL/min (ref >90)
Glucose, Bld: 93 mg/dL (ref 70–99)
Potassium: 3.9 mEq/L (ref 3.5–5.1)
Sodium: 138 mEq/L (ref 135–145)

## 2012-08-29 LAB — CBC
HCT: 41.6 % (ref 39.0–52.0)
Hemoglobin: 13.8 g/dL (ref 13.0–17.0)
MCH: 27.1 pg (ref 26.0–34.0)
MCHC: 33.2 g/dL (ref 30.0–36.0)
MCV: 81.6 fL (ref 78.0–100.0)
Platelets: 296 10*3/uL (ref 150–400)
RBC: 5.1 MIL/uL (ref 4.22–5.81)
RDW: 15.3 % (ref 11.5–15.5)
WBC: 9.7 10*3/uL (ref 4.0–10.5)

## 2012-08-29 LAB — URINE CULTURE: Colony Count: 10000

## 2012-08-29 MED ORDER — METOPROLOL TARTRATE 12.5 MG HALF TABLET: 12.5000 mg | ORAL_TABLET | Freq: Two times a day (BID) | ORAL | Status: DC

## 2012-08-29 MED ORDER — HYDROCODONE-ACETAMINOPHEN 5-325 MG PO TABS: 1.0000 | ORAL_TABLET | Freq: Three times a day (TID) | ORAL | Status: DC | PRN

## 2012-08-29 MED ORDER — VALACYCLOVIR HCL 1 G PO TABS: 1000.0000 mg | ORAL_TABLET | Freq: Three times a day (TID) | ORAL | Status: DC

## 2012-08-29 MED ORDER — RAMIPRIL 2.5 MG PO CAPS
2.5000 mg | ORAL_CAPSULE | Freq: Two times a day (BID) | ORAL | Status: DC
  Administered 2012-08-29: 2.5 mg via ORAL
  Filled 2012-08-29 (×2): qty 1

## 2012-08-29 MED ORDER — WHITE PETROLATUM GEL
Status: DC | PRN
  Filled 2012-08-29 (×2): qty 5

## 2012-08-29 MED ORDER — HYDROMORPHONE HCL PF 1 MG/ML IJ SOLN: 1.0000 mg | INTRAMUSCULAR | Status: DC | PRN

## 2012-08-29 MED ORDER — RAMIPRIL 2.5 MG PO CAPS: 2.5000 mg | ORAL_CAPSULE | Freq: Two times a day (BID) | ORAL | Status: DC

## 2012-08-29 MED ORDER — WHITE PETROLATUM GEL: 1.0000 "application " | Freq: Three times a day (TID) | Status: DC | PRN

## 2012-08-29 MED ORDER — METOPROLOL TARTRATE 12.5 MG HALF TABLET
12.5000 mg | ORAL_TABLET | Freq: Two times a day (BID) | ORAL | Status: DC
  Administered 2012-08-29: 12.5 mg via ORAL
  Filled 2012-08-29 (×2): qty 1

## 2012-08-29 MED ORDER — HYDROCODONE-ACETAMINOPHEN 5-325 MG PO TABS
1.0000 | ORAL_TABLET | Freq: Four times a day (QID) | ORAL | Status: DC | PRN
  Filled 2012-08-29: qty 1

## 2012-08-29 MED ORDER — ALUM & MAG HYDROXIDE-SIMETH 200-200-20 MG/5ML PO SUSP
30.0000 mL | Freq: Four times a day (QID) | ORAL | Status: DC | PRN
  Administered 2012-08-29: 30 mL via ORAL
  Filled 2012-08-29: qty 30

## 2012-08-29 NOTE — Progress Notes (Signed)
[facility] PROGRESS NOTE  Kenneth [name] ZOX:[id number] DOB: 01-11-1956 DOA: 08/26/2012 PCP: Provider Not In System  Assessment/Plan: Aseptic meningitis  Transitioned IV acyclovir to oral valacyclovir, after discussion with ID.  HSV PCR is pending. ID, Dr. Blair Dolphin input appreciated.  Patient had a lumbar puncture on 08/26/2012 which showed 7 CSF from tube 4.  CSF culture showed WBC predominantly mononuclear, no organisms seen.  HIV positive  Not on HAART at this time. Will follow with ID as outpatient.   Hypertension  No well controlled. Decreased Metoprolol due to bradycardia. Increased ramipril to 2.5 mg BID, if blood pressure still not improved increase ramipril dose to 5 mg po bid.  Hypokalemia  Resolved with replacement.  Dizziness Likely due to deconditioning. Patient not orthostatic. PT evaluation pending. Head CT on 08/26/212 shows no acute intracranial abnormality.  DVT Prophylaxis  Subcutaneous heparin.  Code Status: Full code. Family Communication: Patient and wife updated at bedside. Disposition Plan: Pending, possible discharge in 24 hours with improvement in blood pressure.   Consultants:  ID, Dr. Orvan Falconer  Procedures:  Lumbar puncture  Antibiotics:  Acyclovir from 08/26/2012 till 08/28/2012.  Valacyclovir 08/28/2012 >>  HPI/Subjective: No specific concerns excepts having dizziness when he stands. Also having a headache.  Objective: Filed Vitals:   08/29/12 0902 08/29/12 0917 08/29/12 0919 08/29/12 0921  BP: 175/93 179/85 159/113 170/94  Pulse: 57 57 61 65  Temp:      TempSrc:      Resp:  20    Height:      Weight:      SpO2:  97% 100% 100%    Intake/Output Summary (Last 24 hours) at 08/29/12 0931 Last data filed at 08/29/12 0848  Gross per 24 hour  Intake    240 ml  Output   2350 ml  Net  -2110 ml   Filed Weights   08/26/12 2055 08/26/12 2142  Weight: 72.576 kg (160 lb) 71.8 kg (158 lb 4.6 oz)    Exam: Physical  Exam: General: Awake, Oriented, No acute distress. HEENT: EOMI. Neck: Supple CV: S1 and S2 Lungs: Clear to ascultation bilaterally Abdomen: Soft, Nontender, Nondistended, +bowel sounds. Ext: Good pulses. Trace edema.  Data Reviewed: Basic Metabolic Panel:  Lab 08/29/12 8119 08/28/12 0509 08/27/12 0512 08/26/12 2212 08/26/12 1150  NA 138 137 138 -- 137  K 3.9 3.6 3.3* -- 3.3*  CL 102 106 105 -- 100  CO2 29 24 26  -- 26  GLUCOSE 93 92 110* -- 78  BUN 14 10 13  -- 13  CREATININE 0.90 0.86 0.81 0.82 0.78  CALCIUM 9.7 9.0 8.9 -- 9.7  MG -- -- 1.7 -- --  PHOS -- -- -- -- --   Liver Function Tests:  Lab 08/26/12 1150  AST 15  ALT 8  ALKPHOS 98  BILITOT 0.2*  PROT 8.5*  ALBUMIN 3.8    Lab 08/26/12 1150  LIPASE 16  AMYLASE --   No results found for this basename: AMMONIA:5 in the last 168 hours CBC:  Lab 08/29/12 0455 08/27/12 0512 08/26/12 2212 08/26/12 1150  WBC 9.7 7.8 7.5 6.6  NEUTROABS -- -- -- 3.9  HGB 13.8 13.0 13.9 15.0  HCT 41.6 40.0 42.2 44.8  MCV 81.6 82.5 82.3 81.5  PLT 296 253 278 320   Cardiac Enzymes:  Lab 08/27/12 1018 08/27/12 0512 08/26/12 2212 08/26/12 1150  CKTOTAL -- -- -- --  CKMB -- -- -- --  CKMBINDEX -- -- -- --  TROPONINI <0.30 <0.30 <0.30 <0.30  BNP (last 3 results) No results found for this basename: PROBNP:3 in the last 8760 hours CBG:  Lab 08/28/12 0829 08/27/12 0737 08/26/12 2147  GLUCAP 96 120* 90    Recent Results (from the past 240 hour(s))  CULTURE, BLOOD (ROUTINE X 2)     Status: Normal (Preliminary result)   Collection Time   08/26/12 11:44 AM      Component Value Range Status Comment   Specimen Description BLOOD LEFT ANTECUBITAL   Final    Special Requests BOTTLES DRAWN AEROBIC AND ANAEROBIC   Final    Culture  Setup Time 08/26/2012 16:17   Final    Culture     Final    Value:        BLOOD CULTURE RECEIVED NO GROWTH TO DATE CULTURE WILL BE HELD FOR 5 DAYS BEFORE ISSUING A FINAL NEGATIVE REPORT   Report Status  PENDING   Incomplete   CULTURE, BLOOD (ROUTINE X 2)     Status: Normal (Preliminary result)   Collection Time   08/26/12 11:51 AM      Component Value Range Status Comment   Specimen Description BLOOD LEFT ARM   Final    Special Requests BOTTLES DRAWN AEROBIC AND ANAEROBIC   Final    Culture  Setup Time 08/26/2012 16:18   Final    Culture     Final    Value:        BLOOD CULTURE RECEIVED NO GROWTH TO DATE CULTURE WILL BE HELD FOR 5 DAYS BEFORE ISSUING A FINAL NEGATIVE REPORT   Report Status PENDING   Incomplete   URINE CULTURE     Status: Normal (Preliminary result)   Collection Time   08/26/12 12:00 PM      Component Value Range Status Comment   Specimen Description URINE, CATHETERIZED   Final    Special Requests NONE   Final    Culture  Setup Time 08/26/2012 19:08   Final    Colony Count 10,000 COLONIES/ML   Final    Culture GRAM POSITIVE COCCI   Final    Report Status PENDING   Incomplete   CSF CULTURE     Status: Normal (Preliminary result)   Collection Time   08/26/12  2:15 PM      Component Value Range Status Comment   Specimen Description CSF   Final    Special Requests Immunocompromised   Final    Gram Stain     Final    Value: WBC PRESENT, PREDOMINANTLY MONONUCLEAR     NO ORGANISMS SEEN     Performed by [facility] Gram Stain Report Called to,Read Back By and Verified With: Gram Stain Report Called to,Read Back By and Verified With:  M. MENGINNIS @17 :18 ON 08/26/12 BY MURPHY D   Culture NO GROWTH 1 DAY   Final    Report Status PENDING   Incomplete   GRAM STAIN     Status: Normal   Collection Time   08/26/12  2:15 PM      Component Value Range Status Comment   Specimen Description CSF   Final    Special Requests Immunocompromised   Final    Gram Stain     Final    Value: WBC PRESENT, PREDOMINANTLY MONONUCLEAR     NO ORGANISMS SEEN     Gram Stain Report Called to,Read Back By and Verified With: M.[id number]   Report Status 08/26/2012 FINAL    Final      Studies:  No results found.  Scheduled Meds:    . heparin  5,000 Units Subcutaneous Q8H  . metoprolol tartrate  12.5 mg Oral BID  . multivitamin with minerals  1 tablet Oral Daily  . ramipril  2.5 mg Oral BID  . sodium chloride  3 mL Intravenous Q12H  . valACYclovir  1,000 mg Oral TID  . [DISCONTINUED] acyclovir  700 mg Intravenous Q8H  . [DISCONTINUED] metoprolol tartrate  25 mg Oral BID  . [DISCONTINUED] ramipril  1.25 mg Oral BID  . [DISCONTINUED] sodium chloride  3 mL Intravenous Q12H   Continuous Infusions:    . [DISCONTINUED] sodium chloride 1,000 mL (08/28/12 0307)    Principal Problem:  *Benign recurrent aseptic meningitis Active Problems:  HIV positive  HTN (hypertension)  Asthma  Hyperglycemia  Hypokalemia   Time spent: 15 mins.   [facility] A  [facility] Pager [phone]. If 7PM-7AM, please contact night-coverage at [email or web address], password [facility] 08/29/2012, 9:31 AM  LOS: 3 days

## 2012-08-29 NOTE — Discharge Summary (Addendum)
Physician Discharge Summary  Kenneth [name] YNW:[id number] DOB: Sep 19, 1955 DOA: 08/26/2012  PCP: Provider Not In System  Admit date: 08/26/2012 Discharge date: 08/29/2012  Time spent: 35 minutes  Recommendations for Outpatient Follow-up:  Please followup with PCP in 1 week for management of blood pressure and chronic pain.  Please followup with Dr. Orvan Falconer, ID in 1-2 weeks.  Discharge Diagnoses:  Principal Problem:  *Benign recurrent aseptic meningitis Active Problems:  HIV positive  HTN (hypertension)  Asthma  Hyperglycemia  Hypokalemia   Discharge Condition: Stable  Diet recommendation: Heart healthy diet  Filed Weights   08/26/12 2055 08/26/12 2142  Weight: 72.576 kg (160 lb) 71.8 kg (158 lb 4.6 oz)    History of present illness:  On admission: "Kenneth [name] is a 56 y.o. male with h/o HIV positive (CD4 count in Jan was >1000), aseptic meningitis 1 year ago requiring intubation organism unclear, who presents with c/o headache, stiff neck, meningismus, onset 1 day ago. Pain located in neck, back, and head. Worse when he tries to flex his neck, better at rest."  Hospital Course:  Aseptic meningitis  Initially started on IV acyclovir which was transitioned to oral valacyclovir. ID, Dr. Orvan Falconer recommended treating empirically for 10 days total (7 more days after discharge). Patient had a lumbar puncture on 08/26/2012 which showed 7 WBC in CSF from tube 4. CSF culture showed WBC predominantly mononuclear, no organisms seen.   HIV positive  Not on HAART at this time. Will follow with ID as outpatient.   Hypertension  Not well controlled initially. Decreased Metoprolol due to bradycardia. Increased ramipril to 2.5 mg BID. Further titration as outpatient if blood pressure is still not well controlled.   10,000 colonies of Enterococcus from urine culture Discussed with Dr. Orvan Falconer, as patient is not symptomatic does not need to be treated at this time.  Hypokalemia   Resolved with replacement.   Dizziness  Likely due to deconditioning. Patient not orthostatic. No PT needs. Head CT on 08/26/212 shows no acute intracranial abnormality.   Chronic pain May benefit a referral to pain clinic by PCP. Patient given 20 tabs of vicodin at discharge.  Consultants:  ID, Dr. Orvan Falconer Procedures:  Lumbar puncture Antibiotics:  Acyclovir from 08/26/2012 till 08/28/2012.  Valacyclovir 08/28/2012 >> (7 more days)   Discharge Exam: Filed Vitals:   08/29/12 0917 08/29/12 0919 08/29/12 0921 08/29/12 1340  BP: 179/85 159/113 170/94 162/92  Pulse: 57 61 65 53  Temp:    97.4 F (36.3 C)  TempSrc:    Oral  Resp: 20   20  Height:      Weight:      SpO2: 97% 100% 100% 97%   Discharge Instructions  Discharge Orders    Future Orders Please Complete By Expires   Diet - low sodium heart healthy      Increase activity slowly      Discharge instructions      Comments:   Please followup with PCP in 1 week for management of blood pressure and chronic pain.  Please followup with Dr. Orvan Falconer, ID in 1-2 weeks.       Medication List     As of 08/29/2012  2:44 PM    TAKE these medications         COMBIVENT 18-103 MCG/ACT inhaler   Generic drug: albuterol-ipratropium   Inhale 2 puffs into the lungs every 6 (six) hours as needed.      HYDROcodone-acetaminophen 5-325 MG per tablet   Commonly known as:  NORCO/VICODIN   Take 1 tablet by mouth every 8 (eight) hours as needed for pain.      metoprolol tartrate 12.5 mg Tabs   Commonly known as: LOPRESSOR   Take 0.5 tablets (12.5 mg total) by mouth 2 (two) times daily.      multivitamin with minerals Tabs   Take 1 tablet by mouth daily.      ramipril 2.5 MG capsule   Commonly known as: ALTACE   Take 1 capsule (2.5 mg total) by mouth 2 (two) times daily.      valACYclovir 1000 MG tablet   Commonly known as: VALTREX   Take 1 tablet (1,000 mg total) by mouth 3 (three) times daily.      white petrolatum  Gel   Commonly known as: VASELINE   Apply 1 application topically 3 (three) times daily as needed (For dry skin.).           Follow-up Information    Follow up with Johny Sax, MD. Schedule an appointment as soon as possible for a visit in 1 week. (Dr. Bonnita Nasuti, ID)    Contact information:   [address]  [address] [place] [address] [phone]       Follow up with Pcp Not In System. Schedule an appointment as soon as possible for a visit in 1 week.          The results of significant diagnostics from this hospitalization (including imaging, microbiology, ancillary and laboratory) are listed below for reference.    Significant Diagnostic Studies: Dg Shoulder Right  08/17/2012  *RADIOLOGY REPORT*  Clinical Data: Fall down stairs with right shoulder pain.  RIGHT SHOULDER - 2+ VIEW  Comparison: None.  Findings: No acute fracture or dislocation.  Moderate degenerative changes are seen involving the [facility] joint.  There is no evidence of AC joint sprain or separation.  IMPRESSION: No acute fracture.   Original Report Authenticated By: Irish Lack, M.D.    Dg Hip Complete Left  08/17/2012  *RADIOLOGY REPORT*  Clinical Data: Fall down stairs with left hip pain.  LEFT HIP - COMPLETE 2+ VIEW  Comparison: None.  Findings: No acute fracture or dislocation is identified.  No significant arthropathy.  The bony pelvis is intact.  Soft tissues are unremarkable.  IMPRESSION: No acute fracture.   Original Report Authenticated By: Irish Lack, M.D.    Ct Head Wo Contrast  08/26/2012  *RADIOLOGY REPORT*  Clinical Data: Headache.  HIV.  Rule out meningitis  CT HEAD WITHOUT CONTRAST  Technique:  Contiguous axial images were obtained from the base of the skull through the vertex without contrast.  Comparison: None  Findings: Ventricle size is normal.  Negative for intracranial hemorrhage.  Negative for infarct mass or edema.  No evidence of tumor or infection.   Calvarium is intact.  Paranasal sinuses are clear.  Left parietal dermal cyst compatible with a sebaceous cyst.  IMPRESSION: No acute intracranial abnormality.   Original Report Authenticated By: Janeece Riggers, M.D.    Dg Chest Port 1 View  08/26/2012  *RADIOLOGY REPORT*  Clinical Data: Chest pain with shortness of breath and weakness.  PORTABLE CHEST - 1 VIEW  Comparison: Abdominal CT 06/04/2011.  Findings: 1124 hours.  There are lower lung volumes with similar bibasilar fibrotic changes compared with the prior abdominal CT. Upper lobe lucencies suggest emphysema.  No superimposed airspace disease, pleural effusion or pneumothorax is identified.  Heart size and mediastinal contours are stable.  Telemetry  leads overlie the chest.  IMPRESSION: Chronic lung disease with suspected emphysema and basilar fibrosis, similar to prior abdominal CT.  No definite acute findings.   Original Report Authenticated By: Carey Bullocks, M.D.    Dg Lumbar Puncture Fluoro Guide  08/26/2012  *RADIOLOGY REPORT*  Clinical Data:  HIV , neck pain, headache, concern for meningitis.  DIAGNOSTIC LUMBAR PUNCTURE UNDER FLUOROSCOPIC GUIDANCE  Fluoroscopy time:  1.0 minutes.  Technique:  Informed consent was obtained from the patient prior to the procedure, including potential complications of headache, allergy, and pain.   With the patient prone, the lower back was prepped with Betadine.  1% Lidocaine was used for local anesthesia. Lumbar puncture was performed at the L3-L4 level using a 20 gauge needle with return of clear CSF with an opening pressure of 16 cm water.   9  ml of CSF were obtained for laboratory studies.  The patient tolerated the procedure well and there were no apparent complications.  IMPRESSION: Successful lumbar puncture.   Original Report Authenticated By: Genevive Bi, M.D.     Microbiology: Recent Results (from the past 240 hour(s))  CULTURE, BLOOD (ROUTINE X 2)     Status: Normal (Preliminary result)    Collection Time   08/26/12 11:44 AM      Component Value Range Status Comment   Specimen Description BLOOD LEFT ANTECUBITAL   Final    Special Requests BOTTLES DRAWN AEROBIC AND ANAEROBIC   Final    Culture  Setup Time 08/26/2012 16:17   Final    Culture     Final    Value:        BLOOD CULTURE RECEIVED NO GROWTH TO DATE CULTURE WILL BE HELD FOR 5 DAYS BEFORE ISSUING A FINAL NEGATIVE REPORT   Report Status PENDING   Incomplete   CULTURE, BLOOD (ROUTINE X 2)     Status: Normal (Preliminary result)   Collection Time   08/26/12 11:51 AM      Component Value Range Status Comment   Specimen Description BLOOD LEFT ARM   Final    Special Requests BOTTLES DRAWN AEROBIC AND ANAEROBIC   Final    Culture  Setup Time 08/26/2012 16:18   Final    Culture     Final    Value:        BLOOD CULTURE RECEIVED NO GROWTH TO DATE CULTURE WILL BE HELD FOR 5 DAYS BEFORE ISSUING A FINAL NEGATIVE REPORT   Report Status PENDING   Incomplete   URINE CULTURE     Status: Normal   Collection Time   08/26/12 12:00 PM      Component Value Range Status Comment   Specimen Description URINE, CATHETERIZED   Final    Special Requests NONE   Final    Culture  Setup Time 08/26/2012 19:08   Final    Colony Count 10,000 COLONIES/ML   Final    Culture ENTEROCOCCUS SPECIES   Final    Report Status 08/29/2012 FINAL   Final    Organism ID, Bacteria ENTEROCOCCUS SPECIES   Final   CSF CULTURE     Status: Normal (Preliminary result)   Collection Time   08/26/12  2:15 PM      Component Value Range Status Comment   Specimen Description CSF   Final    Special Requests Immunocompromised   Final    Gram Stain     Final    Value: WBC PRESENT, PREDOMINANTLY MONONUCLEAR     NO ORGANISMS  SEEN     Performed by [facility] Gram Stain Report Called to,Read Back By and Verified With: Gram Stain Report Called to,Read Back By and Verified With:  Costella Hatcher @17 :18 ON 08/26/12 BY MURPHY D   Culture NO GROWTH 2 DAYS   Final     Report Status PENDING   Incomplete   GRAM STAIN     Status: Normal   Collection Time   08/26/12  2:15 PM      Component Value Range Status Comment   Specimen Description CSF   Final    Special Requests Immunocompromised   Final    Gram Stain     Final    Value: WBC PRESENT, PREDOMINANTLY MONONUCLEAR     NO ORGANISMS SEEN     Gram Stain Report Called to,Read Back By and Verified With: M.[id number]   Report Status 08/26/2012 FINAL   Final      Labs: Basic Metabolic Panel:  Lab 08/29/12 1610 08/28/12 0509 08/27/12 0512 08/26/12 2212 08/26/12 1150  NA 138 137 138 -- 137  K 3.9 3.6 3.3* -- 3.3*  CL 102 106 105 -- 100  CO2 29 24 26  -- 26  GLUCOSE 93 92 110* -- 78  BUN 14 10 13  -- 13  CREATININE 0.90 0.86 0.81 0.82 0.78  CALCIUM 9.7 9.0 8.9 -- 9.7  MG -- -- 1.7 -- --  PHOS -- -- -- -- --   Liver Function Tests:  Lab 08/26/12 1150  AST 15  ALT 8  ALKPHOS 98  BILITOT 0.2*  PROT 8.5*  ALBUMIN 3.8    Lab 08/26/12 1150  LIPASE 16  AMYLASE --   No results found for this basename: AMMONIA:5 in the last 168 hours CBC:  Lab 08/29/12 0455 08/27/12 0512 08/26/12 2212 08/26/12 1150  WBC 9.7 7.8 7.5 6.6  NEUTROABS -- -- -- 3.9  HGB 13.8 13.0 13.9 15.0  HCT 41.6 40.0 42.2 44.8  MCV 81.6 82.5 82.3 81.5  PLT 296 253 278 320   Cardiac Enzymes:  Lab 08/27/12 1018 08/27/12 0512 08/26/12 2212 08/26/12 1150  CKTOTAL -- -- -- --  CKMB -- -- -- --  CKMBINDEX -- -- -- --  TROPONINI <0.30 <0.30 <0.30 <0.30   BNP: BNP (last 3 results) No results found for this basename: PROBNP:3 in the last 8760 hours CBG:  Lab 08/28/12 0829 08/27/12 0737 08/26/12 2147  GLUCAP 96 120* 90     Signed:  Kolton Kienle A  [facility] 08/29/2012, 2:44 PM

## 2012-08-29 NOTE — Evaluation (Signed)
Physical Therapy Evaluation Patient Details Name: Kenneth [name] MRN: [id number] DOB: 1955-11-15 Today's Date: 08/29/2012 Time: 9528-4132 PT Time Calculation (min): 10 min  PT Assessment / Plan / Recommendation Clinical Impression  56 yo male admitted with meningitis. History of HIV, herpes simplex virus. On eval, pt was Independent with all mobility. No acute or follow up PT needs. 1x eval. PT will sign off.    PT Assessment  Patent does not need any further PT services    Follow Up Recommendations  No PT follow up    Does the patient have the potential to tolerate intense rehabilitation      Barriers to Discharge        Equipment Recommendations  None recommended by PT    Recommendations for Other Services     Frequency      Precautions / Restrictions Precautions Precautions: None Restrictions Weight Bearing Restrictions: No   Pertinent Vitals/Pain Neck-unrated. Warm packs given at wife's request.      Mobility  Bed Mobility Bed Mobility: Supine to Sit Supine to Sit: 7: Independent Transfers Transfers: Sit to Stand;Stand to Sit Sit to Stand: 7: Independent Stand to Sit: 7: Independent Ambulation/Gait Ambulation/Gait Assistance: 7: Independent Ambulation Distance (Feet): 225 Feet Assistive device: None Ambulation/Gait Assistance Details: good gait speed. no lob Gait Pattern: Within Functional Limits Stairs: Yes Stairs Assistance: 6: Modified independent (Device/Increase time) Stair Management Technique: One rail Left;Forwards;Alternating pattern Number of Stairs: 9     Shoulder Instructions     Exercises     PT Diagnosis:    PT Problem List:   PT Treatment Interventions:     PT Goals    Visit Information  Last PT Received On: 08/29/12 Assistance Needed: +1    Subjective Data  Subjective: ""Yeah, I've been up" Patient Stated Goal: Home   Prior Functioning  Home Living Lives With: Spouse Available Help at Discharge: Family Type of Home:  House Home Access: Level entry Home Layout: Two level;Bed/bath upstairs Alternate Level Stairs-Number of Steps: 1 flight Alternate Level Stairs-Rails: Right Home Adaptive Equipment: None Prior Function Level of Independence: Independent Able to Take Stairs?: Yes Communication Communication: No difficulties    Cognition  Overall Cognitive Status: Appears within functional limits for tasks assessed/performed Arousal/Alertness: Awake/[name] Orientation Level: Appears intact for tasks assessed Behavior During Session: [facility] [name] for tasks performed    Extremity/Trunk Assessment Right Lower Extremity Assessment RLE ROM/Strength/Tone: [facility] for tasks assessed Left Lower Extremity Assessment LLE ROM/Strength/Tone: [facility] for tasks assessed Trunk Assessment Trunk Assessment: Normal   Balance    End of Session PT - End of Session Equipment Utilized During Treatment: Gait belt Activity Tolerance: Patient tolerated treatment well Patient left: in chair;with call bell/phone within reach;with family/visitor present  GP     Kenneth [name] Kenneth [name] 08/29/2012, 2:46 PM [phone]

## 2012-08-29 NOTE — Progress Notes (Signed)
Pt asking for something for indigestion, text page sent to Dr Betti Cruz and new orders noted.

## 2012-08-29 NOTE — Progress Notes (Signed)
Patient ID: Kenneth [name], male   DOB: [birth year], 56 y.o.   MRN: [id number]    [facility] for Infectious Disease    Date of Admission:  08/26/2012           Day 3 herpes simplex therapy Principal Problem:  *Benign recurrent aseptic meningitis Active Problems:  HIV positive  HTN (hypertension)  Asthma  Hyperglycemia  Hypokalemia   Subjective: He is feeling much better with decreased headache. He is eager to go home.  Objective: Temp:  [97.4 F (36.3 C)-98.4 F (36.9 C)] 97.4 F (36.3 C) (12/12 1340) Pulse Rate:  [53-75] 53  (12/12 1340) Resp:  [18-20] 20  (12/12 1340) BP: (148-187)/(85-113) 162/92 mmHg (12/12 1340) SpO2:  [93 %-100 %] 97 % (12/12 1340)  General:  He is in good spirits. He is sitting up in his room. Skin:  no rash Neck: Supple Lungs:  clear Cor: Regular S1 and S2 no murmurs  Microbiology: Recent Results (from the past 240 hour(s))  CULTURE, BLOOD (ROUTINE X 2)     Status: Normal (Preliminary result)   Collection Time   08/26/12 11:44 AM      Component Value Range Status Comment   Specimen Description BLOOD LEFT ANTECUBITAL   Final    Special Requests BOTTLES DRAWN AEROBIC AND ANAEROBIC   Final    Culture  Setup Time 08/26/2012 16:17   Final    Culture     Final    Value:        BLOOD CULTURE RECEIVED NO GROWTH TO DATE CULTURE WILL BE HELD FOR 5 DAYS BEFORE ISSUING A FINAL NEGATIVE REPORT   Report Status PENDING   Incomplete   CULTURE, BLOOD (ROUTINE X 2)     Status: Normal (Preliminary result)   Collection Time   08/26/12 11:51 AM      Component Value Range Status Comment   Specimen Description BLOOD LEFT ARM   Final    Special Requests BOTTLES DRAWN AEROBIC AND ANAEROBIC   Final    Culture  Setup Time 08/26/2012 16:18   Final    Culture     Final    Value:        BLOOD CULTURE RECEIVED NO GROWTH TO DATE CULTURE WILL BE HELD FOR 5 DAYS BEFORE ISSUING A FINAL NEGATIVE REPORT   Report Status PENDING   Incomplete   URINE CULTURE     Status:  Normal   Collection Time   08/26/12 12:00 PM      Component Value Range Status Comment   Specimen Description URINE, CATHETERIZED   Final    Special Requests NONE   Final    Culture  Setup Time 08/26/2012 19:08   Final    Colony Count 10,000 COLONIES/ML   Final    Culture ENTEROCOCCUS SPECIES   Final    Report Status 08/29/2012 FINAL   Final    Organism ID, Bacteria ENTEROCOCCUS SPECIES   Final   CSF CULTURE     Status: Normal (Preliminary result)   Collection Time   08/26/12  2:15 PM      Component Value Range Status Comment   Specimen Description CSF   Final    Special Requests Immunocompromised   Final    Gram Stain     Final    Value: WBC PRESENT, PREDOMINANTLY MONONUCLEAR     NO ORGANISMS SEEN     Performed by [facility] Gram Stain Report Called to,Read Back By and Verified With: Gram  Stain Report Called to,Read Back By and Verified With:  M. MENGINNIS @17 :18 ON 08/26/12 BY MURPHY D   Culture NO GROWTH 2 DAYS   Final    Report Status PENDING   Incomplete   GRAM STAIN     Status: Normal   Collection Time   08/26/12  2:15 PM      Component Value Range Status Comment   Specimen Description CSF   Final    Special Requests Immunocompromised   Final    Gram Stain     Final    Value: WBC PRESENT, PREDOMINANTLY MONONUCLEAR     NO ORGANISMS SEEN     Gram Stain Report Called to,Read Back By and Verified With: M.[email or web address]/[id number]   Report Status 08/26/2012 FINAL   Final     Assessment: His meningitis is resolving. I would favor 7 more days of oral Valtrex.  His HIV is asymptomatic. I will follow him up in clinic within 2 weeks.  He has no symptoms suggesting active urinary tract infection. I would ignore the urine culture growing enterococcus.  Plan: 1. Continue Valtrex for 7 more days 2. I will arrange followup in our clinic  Cliffton Asters, MD [facility] for Infectious Disease [facility] [phone] pager   [phone] cell 08/29/2012, 3:14  PM

## 2012-08-30 LAB — GLUCOSE, CAPILLARY: Glucose-Capillary: 94 mg/dL (ref 70–99)

## 2012-09-01 LAB — CULTURE, BLOOD (ROUTINE X 2)
Culture: NO GROWTH
Culture: NO GROWTH

## 2012-09-10 ENCOUNTER — Encounter: Payer: Self-pay | Admitting: Internal Medicine

## 2012-09-10 ENCOUNTER — Ambulatory Visit (INDEPENDENT_AMBULATORY_CARE_PROVIDER_SITE_OTHER): Payer: Medicare Other | Admitting: Internal Medicine

## 2012-09-10 VITALS — BP 135/92 | HR 77 | Temp 97.5°F | Ht 68.5 in | Wt 153.0 lb

## 2012-09-10 DIAGNOSIS — Z21 Asymptomatic human immunodeficiency virus [HIV] infection status: Secondary | ICD-10-CM

## 2012-09-10 DIAGNOSIS — Z119 Encounter for screening for infectious and parasitic diseases, unspecified: Secondary | ICD-10-CM

## 2012-09-10 DIAGNOSIS — Z113 Encounter for screening for infections with a predominantly sexual mode of transmission: Secondary | ICD-10-CM

## 2012-09-10 DIAGNOSIS — Z79899 Other long term (current) drug therapy: Secondary | ICD-10-CM

## 2012-09-10 DIAGNOSIS — Z23 Encounter for immunization: Secondary | ICD-10-CM

## 2012-09-10 LAB — CSF CULTURE

## 2012-09-10 LAB — CSF CULTURE W GRAM STAIN: Culture: NO GROWTH

## 2012-09-10 MED ORDER — HYDROCODONE-ACETAMINOPHEN 5-325 MG PO TABS: 1.0000 | ORAL_TABLET | Freq: Three times a day (TID) | ORAL | Status: DC | PRN

## 2012-09-10 NOTE — Progress Notes (Signed)
Patient ID: Kenneth [name], male   DOB: [birth year], 56 y.o.   MRN: [id number]     [facility] for Infectious Disease  Patient Active Problem List  Diagnosis  . Benign recurrent aseptic meningitis  . HIV positive  . HTN (hypertension)  . Asthma  . Hyperglycemia  . Hypokalemia    Patient's Medications  New Prescriptions   No medications on file  Previous Medications   ALBUTEROL-IPRATROPIUM (COMBIVENT) 18-103 MCG/ACT INHALER    Inhale 2 puffs into the lungs every 6 (six) hours as needed.   METOPROLOL TARTRATE (LOPRESSOR) 12.5 MG TABS    Take 0.5 tablets (12.5 mg total) by mouth 2 (two) times daily.   MULTIPLE VITAMIN (MULITIVITAMIN WITH MINERALS) TABS    Take 1 tablet by mouth daily.   RAMIPRIL (ALTACE) 2.5 MG CAPSULE    Take 1 capsule (2.5 mg total) by mouth 2 (two) times daily.   VALACYCLOVIR (VALTREX) 1000 MG TABLET    Take 1 tablet (1,000 mg total) by mouth 3 (three) times daily.  Modified Medications   Modified Medication Previous Medication   HYDROCODONE-ACETAMINOPHEN (NORCO/VICODIN) 5-325 MG PER TABLET HYDROcodone-acetaminophen (NORCO/VICODIN) 5-325 MG per tablet      Take 1 tablet by mouth every 8 (eight) hours as needed for pain.    Take 1 tablet by mouth every 8 (eight) hours as needed for pain.  Discontinued Medications   WHITE PETROLATUM (VASELINE) GEL    Apply 1 application topically 3 (three) times daily as needed (For dry skin.).    Subjective: [name] is in for his hospital followup visit. 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 Unfortunately the specimen was not processed appropriately and the herpes simplex PCR could not be done. He has a history of meningitis about one year ago while living in [place].  He was diagnosed with HIV infection in 2005 when he suffered a back injury. He states that his CD4 count has always been over 500 and he was told by his physicians in  [place] that he did not need treatment. He is not sure what his viral load was. He has never been on antiretroviral therapy. He has never had any complication of HIV infection that he is aware of. His wife has HIV infection as well.  He is still having some headache and neck pain. He is out of the 20 Vicodin were given him on discharge on December 9. He does not have a local primary care physician yet.  Objective: Temp: 97.5 F (36.4 C) (12/24 0934) Temp src: Oral (12/24 0934) BP: 135/92 mmHg (12/24 0934) Pulse Rate: 77  (12/24 0934)  General: He appears uncomfortable from neck pain. He resists rotation of his neck and flexion. Oral: No oropharyngeal lesions Skin: No rash Lungs: Clear Cor: Regular S1 and S2 no murmurs  Lab Results HIV 1 RNA Quant (copies/mL)  Date Value  08/26/2012 73*     CD4 T Cell Abs (cmm)  Date Value  08/26/2012 760      Assessment: He has very mild and resolving meningitis. He has finished his valacyclovir. I will try to get his records from [place].  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 He has copies of his past HIV lab work that I asked him to bring in at the time of his next visit. I will continue him off of antiretroviral therapy for now.  Influenza vaccination  Will give him the  names of some local primary care physicians in her network  Plan: 1. Observe off of antiretroviral therapy for now 2. Review records from [place] 3. 1 refill of Vicodin given #20 4. Followup in 2 weeks 5. PCP referral   Cliffton Asters, MD [facility] [phone] pager   [phone] cell 09/10/2012, 9:58 AM

## 2012-09-13 ENCOUNTER — Emergency Department (HOSPITAL_COMMUNITY): Payer: Medicare Other

## 2012-09-13 ENCOUNTER — Emergency Department (HOSPITAL_COMMUNITY)
Admission: EM | Admit: 2012-09-13 | Discharge: 2012-09-14 | Disposition: A | Discharge status: Home / Self Care | Payer: Medicare Other | Attending: Emergency Medicine | Admitting: Emergency Medicine

## 2012-09-13 ENCOUNTER — Encounter (HOSPITAL_COMMUNITY): Payer: Self-pay | Admitting: Nurse Practitioner

## 2012-09-13 DIAGNOSIS — M542 Cervicalgia: Secondary | ICD-10-CM

## 2012-09-13 DIAGNOSIS — I10 Essential (primary) hypertension: Secondary | ICD-10-CM | POA: Insufficient documentation

## 2012-09-13 DIAGNOSIS — R51 Headache: Secondary | ICD-10-CM | POA: Insufficient documentation

## 2012-09-13 DIAGNOSIS — Z79899 Other long term (current) drug therapy: Secondary | ICD-10-CM | POA: Insufficient documentation

## 2012-09-13 DIAGNOSIS — Z8709 Personal history of other diseases of the respiratory system: Secondary | ICD-10-CM | POA: Insufficient documentation

## 2012-09-13 DIAGNOSIS — F17200 Nicotine dependence, unspecified, uncomplicated: Secondary | ICD-10-CM | POA: Insufficient documentation

## 2012-09-13 DIAGNOSIS — Z21 Asymptomatic human immunodeficiency virus [HIV] infection status: Secondary | ICD-10-CM | POA: Insufficient documentation

## 2012-09-13 DIAGNOSIS — I2510 Atherosclerotic heart disease of native coronary artery without angina pectoris: Secondary | ICD-10-CM | POA: Insufficient documentation

## 2012-09-13 DIAGNOSIS — Z8661 Personal history of infections of the central nervous system: Secondary | ICD-10-CM | POA: Insufficient documentation

## 2012-09-13 DIAGNOSIS — Z9889 Other specified postprocedural states: Secondary | ICD-10-CM | POA: Insufficient documentation

## 2012-09-13 DIAGNOSIS — M2569 Stiffness of other specified joint, not elsewhere classified: Secondary | ICD-10-CM | POA: Insufficient documentation

## 2012-09-13 LAB — CSF CELL COUNT WITH DIFFERENTIAL
RBC Count, CSF: 1 /mm3 — ABNORMAL HIGH
RBC Count, CSF: 534 /mm3 — ABNORMAL HIGH
Tube #: 1
Tube #: 4
WBC, CSF: 1 /mm3 (ref 0–5)
WBC, CSF: 4 /mm3 (ref 0–5)

## 2012-09-13 LAB — COMPREHENSIVE METABOLIC PANEL
ALT: 12 U/L (ref 0–53)
AST: 18 U/L (ref 0–37)
Albumin: 3.9 g/dL (ref 3.5–5.2)
Alkaline Phosphatase: 94 U/L (ref 39–117)
BUN: 19 mg/dL (ref 6–23)
CO2: 30 mEq/L (ref 19–32)
Calcium: 9.9 mg/dL (ref 8.4–10.5)
Chloride: 99 mEq/L (ref 96–112)
Creatinine, Ser: 0.85 mg/dL (ref 0.50–1.35)
GFR calc Af Amer: >90 mL/min (ref >90)
GFR calc non Af Amer: >90 mL/min (ref >90)
Glucose, Bld: 88 mg/dL (ref 70–99)
Potassium: 4.6 mEq/L (ref 3.5–5.1)
Sodium: 137 mEq/L (ref 135–145)
Total Bilirubin: 0.2 mg/dL — ABNORMAL LOW (ref 0.3–1.2)
Total Protein: 8.4 g/dL — ABNORMAL HIGH (ref 6.0–8.3)

## 2012-09-13 LAB — CBC WITH DIFFERENTIAL/PLATELET
Basophils Absolute: 0 10*3/uL (ref 0.0–0.1)
Basophils Relative: 0 % (ref 0–1)
Eosinophils Absolute: 0 10*3/uL (ref 0.0–0.7)
Eosinophils Relative: 1 % (ref 0–5)
HCT: 45.2 % (ref 39.0–52.0)
Hemoglobin: 14.8 g/dL (ref 13.0–17.0)
Lymphocytes Relative: 38 % (ref 12–46)
Lymphs Abs: 2.3 10*3/uL (ref 0.7–4.0)
MCH: 27.5 pg (ref 26.0–34.0)
MCHC: 32.7 g/dL (ref 30.0–36.0)
MCV: 83.9 fL (ref 78.0–100.0)
Monocytes Absolute: 0.5 10*3/uL (ref 0.1–1.0)
Monocytes Relative: 8 % (ref 3–12)
Neutro Abs: 3.2 10*3/uL (ref 1.7–7.7)
Neutrophils Relative %: 53 % (ref 43–77)
Platelets: 249 10*3/uL (ref 150–400)
RBC: 5.39 MIL/uL (ref 4.22–5.81)
RDW: 15.7 % — ABNORMAL HIGH (ref 11.5–15.5)
WBC: 6.1 10*3/uL (ref 4.0–10.5)

## 2012-09-13 LAB — GLUCOSE, CSF: Glucose, CSF: 63 mg/dL (ref 43–76)

## 2012-09-13 LAB — GRAM STAIN

## 2012-09-13 LAB — PROTEIN, CSF: Total  Protein, CSF: 37 mg/dL (ref 15–45)

## 2012-09-13 MED ORDER — OXYCODONE-ACETAMINOPHEN 5-325 MG PO TABS: 1.0000 | ORAL_TABLET | ORAL | Status: DC | PRN

## 2012-09-13 MED ORDER — HYDROMORPHONE HCL PF 1 MG/ML IJ SOLN
1.0000 mg | Freq: Once | INTRAMUSCULAR | Status: AC
  Administered 2012-09-13: 1 mg via INTRAVENOUS
  Filled 2012-09-13: qty 1

## 2012-09-13 MED ORDER — OXYCODONE-ACETAMINOPHEN 5-325 MG PO TABS
1.0000 | ORAL_TABLET | Freq: Once | ORAL | Status: AC
  Administered 2012-09-14: 1 via ORAL
  Filled 2012-09-13: qty 1

## 2012-09-13 NOTE — ED Notes (Signed)
Pt reports he was just treated for recurrent meningitis, last antibiotic was taken 8 days ago, then today with neck and back pain again, concerned for meningitis. Afebrile. Pt is A&Ox4, breathing easily

## 2012-09-13 NOTE — ED Provider Notes (Signed)
History     CSN: [id number]  Arrival date & time 09/13/12  1524   First MD Initiated Contact with Patient 09/13/12 1955      Chief Complaint  Patient presents with  . Neck Pain    (Consider location/radiation/quality/duration/timing/severity/associated sxs/prior treatment) Patient is a 56 y.o. male presenting with general illness. The history is provided by the patient. No language interpreter was used.  Illness  The current episode started yesterday. The problem occurs continuously. The problem has been unchanged. The problem is moderate. Nothing relieves the symptoms. Nothing aggravates the symptoms. Associated symptoms include headaches and neck pain. Pertinent negatives include no fever, no abdominal pain, no constipation, no diarrhea, no nausea, no vomiting, no congestion, no sore throat, no cough and no rash.    Past Medical History  Diagnosis Date  . HIV positive   . Hypertension   . Meningitis   . Bronchitis   . Coronary artery disease     Past Surgical History  Procedure Date  . Abdominal surgery   . Back surgery   . Appendectomy   . Elbow surgery     History reviewed. No pertinent family history.  History  Substance Use Topics  . Smoking status: Current Every Day Smoker -- 0.5 packs/day for 33 years    Types: Cigarettes  . Smokeless tobacco: Never Used  . Alcohol Use: No      Review of Systems  Constitutional: Negative for fever and chills.  HENT: Positive for neck pain and neck stiffness. Negative for congestion and sore throat.   Respiratory: Negative for cough and shortness of breath.   Cardiovascular: Negative for chest pain and leg swelling.  Gastrointestinal: Negative for nausea, vomiting, abdominal pain, diarrhea and constipation.  Genitourinary: Negative for dysuria and frequency.  Skin: Negative for color change and rash.  Neurological: Positive for headaches. Negative for dizziness.  Psychiatric/Behavioral: Negative for confusion and  agitation.  All other systems reviewed and are negative.    Allergies  Flexeril; Ibuprofen; Naproxen; Tramadol; and Hydrocodone  Home Medications   Current Outpatient Rx  Name  Route  Sig  Dispense  Refill  . IPRATROPIUM-ALBUTEROL 18-103 MCG/ACT IN AERO   Inhalation   Inhale 2 puffs into the lungs every 6 (six) hours as needed. For shortness of breath         . METOPROLOL TARTRATE 12.5 MG HALF TABLET   Oral   Take 0.5 tablets (12.5 mg total) by mouth 2 (two) times daily.   60 tablet   0   . ADULT MULTIVITAMIN W/MINERALS CH   Oral   Take 1 tablet by mouth daily.         Marland Kitchen RAMIPRIL 2.5 MG PO CAPS   Oral   Take 1 capsule (2.5 mg total) by mouth 2 (two) times daily.   60 capsule   0   . VALACYCLOVIR HCL 1 G PO TABS   Oral   Take 1 tablet (1,000 mg total) by mouth 3 (three) times daily.   21 tablet   0     BP 137/101  Pulse 93  Temp 98.1 F (36.7 C) (Oral)  Resp 16  SpO2 99%  Physical Exam  Constitutional: He is oriented to person, place, and time. He appears well-developed and well-nourished. No distress.  HENT:  Head: Normocephalic and atraumatic.  Eyes: EOM are normal. Pupils are equal, round, and reactive to light.  Neck: Muscular tenderness present. No spinous process tenderness present. Rigidity present. Brudzinski's sign noted. No Kernig's  sign noted.    Cardiovascular: Normal rate and regular rhythm.   Pulmonary/Chest: Effort normal. No respiratory distress.  Abdominal: Soft. He exhibits no distension.  Musculoskeletal: Normal range of motion. He exhibits no edema.  Neurological: He is alert and oriented to person, place, and time. He has normal strength. No cranial nerve deficit or sensory deficit. GCS eye subscore is 4. GCS verbal subscore is 5. GCS motor subscore is 6.  Skin: Skin is warm and dry.  Psychiatric: He has a normal mood and affect. His behavior is normal.    ED Course  LUMBAR PUNCTURE Date/Time: 09/13/2012 10:21 PM Performed by:  Audelia Hives Authorized by: Ethelda Chick Consent: Written consent obtained. Risks and benefits: risks, benefits and alternatives were discussed Consent given by: patient Time out: Immediately prior to procedure a "time out" was called to verify the correct patient, procedure, equipment, support staff and site/side marked as required. Indications: evaluation for infection Anesthesia: local infiltration Local anesthetic: lidocaine 1% without epinephrine Anesthetic total: 3 ml Preparation: Patient was prepped and draped in the usual sterile fashion. Lumbar space: L3-L4 interspace Patient's position: left lateral decubitus Needle gauge: 22 Needle type: spinal needle - Quincke tip Number of attempts: 2   (including critical care time)  Labs Reviewed - No data to display No results found. Results for orders placed during the hospital encounter of 09/13/12  CBC WITH DIFFERENTIAL      Component Value Range   WBC 6.1  4.0 - 10.5 K/uL   RBC 5.39  4.22 - 5.81 MIL/uL   Hemoglobin 14.8  13.0 - 17.0 g/dL   HCT 16.1  09.6 - 04.5 %   MCV 83.9  78.0 - 100.0 fL   MCH 27.5  26.0 - 34.0 pg   MCHC 32.7  30.0 - 36.0 g/dL   RDW 40.9 (*) 81.1 - 91.4 %   Platelets 249  150 - 400 K/uL   Neutrophils Relative 53  43 - 77 %   Neutro Abs 3.2  1.7 - 7.7 K/uL   Lymphocytes Relative 38  12 - 46 %   Lymphs Abs 2.3  0.7 - 4.0 K/uL   Monocytes Relative 8  3 - 12 %   Monocytes Absolute 0.5  0.1 - 1.0 K/uL   Eosinophils Relative 1  0 - 5 %   Eosinophils Absolute 0.0  0.0 - 0.7 K/uL   Basophils Relative 0  0 - 1 %   Basophils Absolute 0.0  0.0 - 0.1 K/uL  COMPREHENSIVE METABOLIC PANEL      Component Value Range   Sodium 137  135 - 145 mEq/L   Potassium 4.6  3.5 - 5.1 mEq/L   Chloride 99  96 - 112 mEq/L   CO2 30  19 - 32 mEq/L   Glucose, Bld 88  70 - 99 mg/dL   BUN 19  6 - 23 mg/dL   Creatinine, Ser 7.82  0.50 - 1.35 mg/dL   Calcium 9.9  8.4 - 95.6 mg/dL   Total Protein 8.4 (*) 6.0 - 8.3 g/dL    Albumin 3.9  3.5 - 5.2 g/dL   AST 18  0 - 37 U/L   ALT 12  0 - 53 U/L   Alkaline Phosphatase 94  39 - 117 U/L   Total Bilirubin 0.2 (*) 0.3 - 1.2 mg/dL   GFR calc non Af Amer >90  >90 mL/min   GFR calc Af Amer >90  >90 mL/min  CSF CELL COUNT WITH DIFFERENTIAL  Component Value Range   Tube # 1     Color, CSF PINK (*) COLORLESS   Appearance, CSF CLEAR  CLEAR   Supernatant NOT INDICATED     RBC Count, CSF 534 (*) 0 /cu mm   WBC, CSF 4  0 - 5 /cu mm   Segmented Neutrophils-CSF RARE  0 - 6 %   Lymphs, CSF FEW  40 - 80 %   Monocyte-Macrophage-Spinal Fluid FEW  15 - 45 %  CSF CELL COUNT WITH DIFFERENTIAL      Component Value Range   Tube # 4     Color, CSF COLORLESS  COLORLESS   Appearance, CSF CLEAR  CLEAR   Supernatant NOT INDICATED     RBC Count, CSF 1 (*) 0 /cu mm   WBC, CSF 1  0 - 5 /cu mm   Segmented Neutrophils-CSF RARE  0 - 6 %   Lymphs, CSF FEW  40 - 80 %  GRAM STAIN      Component Value Range   Specimen Description CSF     Special Requests NONE     Gram Stain       Value: WBC PRESENT, PREDOMINANTLY MONONUCLEAR     NO ORGANISMS SEEN     CYTOSPIN SLIDE   Report Status 09/13/2012 FINAL    GLUCOSE, CSF      Component Value Range   Glucose, CSF 63  43 - 76 mg/dL  PROTEIN, CSF      Component Value Range   Total  Protein, CSF 37  15 - 45 mg/dL   CT Head Wo Contrast (Final result)   Result time:09/13/12 2106    Final result by Rad Results In Interface (09/13/12 21:06:04)    Narrative:   *RADIOLOGY REPORT*  Clinical Data: Neck pain,  CT HEAD WITHOUT CONTRAST  Technique: Contiguous axial images were obtained from the base of the skull through the vertex without contrast.  Comparison: 08/26/2012  Findings: The brain has a normal appearance without evidence for hemorrhage, infarction, hydrocephalus, or mass lesion. There is no extra axial fluid collection. The skull and paranasal sinuses are normal.  IMPRESSION:  1. Normal brain   Original Report  Authenticated By: Signa Kell, M.D.              No diagnosis found.    MDM  Pt w/ recent dx of meningitis - aseptic s/p treatment w/ valtrex completion 8 days ago. Hx of HIV w/ CD4 >500, now w/ acute onset neck stiffness and HA. States sx came on gradually yesterday, no injury. States pain feels similar to prior meningitis. A/w headache at onset of neck pain. Headache has now resolved.  Denies fever. Admits to feeling light headed. On exam afebrile, clinical meningismus - pain w/ minimal ROM of neck. + bruzinski, no focal weakness. Concern for meningitis. Will check CT head, cbc, cmp, blood cultures and perform LP. Doubt bacterial meningitis - likely viral - will refrain from initiating abx until after LP as pt does not appear clinically toxic. Will obtain head CT to eval for edema in light of recent infection and now w/ recurrent sx.   Course - reassessed - LP performed w/out event, CSF reveals 4 WBC in tube one and 1 WBC in tube 4, WBC - mononuclear noted on gram stain. Normal protein and glucose. CT head - NAICA, remainder of labs unremarkable. D/w infectious disease - based on these CSF findings low likelihood to be acute meningitis. No current indication to admit to  hospital or being abx. Will follow culture and hsv pcr. Pain possibly 2/2 musculoskeletal etiology or residual from resent meningitis. Given percocet for pain and referral to pcp for further work up and management of pts medical problems. Given return precautions. D/c home in good condition.   1. Neck pain    New Prescriptions   OXYCODONE-ACETAMINOPHEN (PERCOCET) 5-325 MG PER TABLET    Take 1 tablet by mouth every 4 (four) hours as needed for pain.   Provider Not In System    [facility] EMERGENCY DEPARTMENT [address] [id number] mc [place] [address] [phone]   [facility]              [address] [place]  [id number]          Audelia Hives, MD 09/14/12 [phone]

## 2012-09-14 NOTE — ED Provider Notes (Signed)
I saw and evaluated the patient, reviewed the resident's note and I agree with the findings and plan.  I was present and supervised the resident's performance of all procedures which includes: lumbar puncture   Ethelda Chick, MD 09/14/12 1540

## 2012-09-16 LAB — HERPES SIMPLEX VIRUS(HSV) DNA BY PCR
HSV 1 DNA: NOT DETECTED
HSV 2 DNA: NOT DETECTED

## 2012-09-17 LAB — CSF CULTURE W GRAM STAIN: Culture: NO GROWTH

## 2012-09-17 LAB — CSF CULTURE

## 2012-09-18 ENCOUNTER — Emergency Department (HOSPITAL_COMMUNITY)
Admission: EM | Admit: 2012-09-18 | Discharge: 2012-09-18 | Disposition: A | Discharge status: Home / Self Care | Payer: Medicare Other | Attending: Emergency Medicine | Admitting: Emergency Medicine

## 2012-09-18 ENCOUNTER — Encounter (HOSPITAL_COMMUNITY): Payer: Self-pay | Admitting: *Deleted

## 2012-09-18 DIAGNOSIS — Z79899 Other long term (current) drug therapy: Secondary | ICD-10-CM | POA: Insufficient documentation

## 2012-09-18 DIAGNOSIS — Z8709 Personal history of other diseases of the respiratory system: Secondary | ICD-10-CM | POA: Insufficient documentation

## 2012-09-18 DIAGNOSIS — F17200 Nicotine dependence, unspecified, uncomplicated: Secondary | ICD-10-CM | POA: Insufficient documentation

## 2012-09-18 DIAGNOSIS — Z8669 Personal history of other diseases of the nervous system and sense organs: Secondary | ICD-10-CM | POA: Insufficient documentation

## 2012-09-18 DIAGNOSIS — I10 Essential (primary) hypertension: Secondary | ICD-10-CM | POA: Insufficient documentation

## 2012-09-18 DIAGNOSIS — R51 Headache: Secondary | ICD-10-CM | POA: Insufficient documentation

## 2012-09-18 DIAGNOSIS — M549 Dorsalgia, unspecified: Secondary | ICD-10-CM | POA: Insufficient documentation

## 2012-09-18 DIAGNOSIS — M542 Cervicalgia: Secondary | ICD-10-CM | POA: Insufficient documentation

## 2012-09-18 DIAGNOSIS — I2510 Atherosclerotic heart disease of native coronary artery without angina pectoris: Secondary | ICD-10-CM | POA: Insufficient documentation

## 2012-09-18 DIAGNOSIS — Z21 Asymptomatic human immunodeficiency virus [HIV] infection status: Secondary | ICD-10-CM | POA: Insufficient documentation

## 2012-09-18 MED ORDER — DIAZEPAM 10 MG PO TABS: 10.0000 mg | ORAL_TABLET | Freq: Four times a day (QID) | ORAL | Status: DC | PRN

## 2012-09-18 MED ORDER — OXYCODONE-ACETAMINOPHEN 5-325 MG PO TABS: 1.0000 | ORAL_TABLET | ORAL | Status: DC | PRN

## 2012-09-18 MED ORDER — OXYCODONE-ACETAMINOPHEN 5-325 MG PO TABS
2.0000 | ORAL_TABLET | Freq: Once | ORAL | Status: AC
  Administered 2012-09-18: 2 via ORAL
  Filled 2012-09-18: qty 2

## 2012-09-18 NOTE — ED Notes (Signed)
Pt presented to ED with headache and neck pain.He gives a history of meningities and LP was done a week ago.Pt is HIV positive.

## 2012-09-18 NOTE — ED Provider Notes (Signed)
History     CSN: [id number]  Arrival date & time 09/18/12  0619   First MD Initiated Contact with Patient 09/18/12 [phone]      Chief Complaint  Patient presents with  . Headache    (Consider location/radiation/quality/duration/timing/severity/associated sxs/prior treatment) HPI Comments: Clemmie Marxen is a 57 y.o. male who has had had headache neck pain, back pain, for several days. He was in the ED, 3 days ago, and had a lumbar puncture for meningitis. The tap was negative. He had previously had meningitis, a couple of weeks before. Since leaving, the ED, he has been cognizant of, and following the direction to lie down most of the time. Yesterday, he ran out of his Percocet. Percocet has been controlling his pain. He denies fever, chills, nausea, vomiting, chest pain, or shortness of breath. He has a mild headache. There are no modifying factors.  Patient is a 57 y.o. male presenting with headaches. The history is provided by the patient.  Headache     Past Medical History  Diagnosis Date  . HIV positive   . Hypertension   . Meningitis   . Bronchitis   . Coronary artery disease     Past Surgical History  Procedure Date  . Abdominal surgery   . Back surgery   . Appendectomy   . Elbow surgery     No family history on file.  History  Substance Use Topics  . Smoking status: Current Every Day Smoker -- 0.5 packs/day for 33 years    Types: Cigarettes  . Smokeless tobacco: Never Used  . Alcohol Use: No      Review of Systems  Neurological: Positive for headaches.  All other systems reviewed and are negative.    Allergies  Cyclobenzaprine; Flexeril; Ibuprofen; Naproxen; Tramadol; Bee venom; Hydrocodone; Nabumetone; and Other  Home Medications   Current Outpatient Rx  Name  Route  Sig  Dispense  Refill  . IPRATROPIUM-ALBUTEROL 18-103 MCG/ACT IN AERO   Inhalation   Inhale 2 puffs into the lungs every 6 (six) hours as needed. For shortness of breath         .  METOPROLOL TARTRATE 12.5 MG HALF TABLET   Oral   Take 0.5 tablets (12.5 mg total) by mouth 2 (two) times daily.   60 tablet   0   . ADULT MULTIVITAMIN W/MINERALS CH   Oral   Take 1 tablet by mouth daily.         . OXYCODONE-ACETAMINOPHEN 5-325 MG PO TABS   Oral   Take 1 tablet by mouth every 4 (four) hours as needed for pain.   10 tablet   0   . RAMIPRIL 2.5 MG PO CAPS   Oral   Take 1 capsule (2.5 mg total) by mouth 2 (two) times daily.   60 capsule   0   . VALACYCLOVIR HCL 1 G PO TABS   Oral   Take 1,000 mg by mouth 3 (three) times daily as needed. Takes during an outbreak         . DIAZEPAM 10 MG PO TABS   Oral   Take 1 tablet (10 mg total) by mouth every 6 (six) hours as needed (Muscle spasm).   20 tablet   0   . OXYCODONE-ACETAMINOPHEN 5-325 MG PO TABS   Oral   Take 1 tablet by mouth every 4 (four) hours as needed for pain.   20 tablet   0     BP 130/82  Pulse 75  Temp 97.7 F (36.5 C) (Oral)  Resp 18  SpO2 94%  Physical Exam  Nursing note and vitals reviewed. Constitutional: He is oriented to person, place, and time. He appears well-developed and well-nourished.  HENT:  Head: Normocephalic and atraumatic.  Right Ear: External ear normal.  Left Ear: External ear normal.  Eyes: Conjunctivae normal and EOM are normal. Pupils are equal, round, and reactive to light.  Neck: Normal range of motion and phonation normal. Neck supple.  Cardiovascular: Normal rate, regular rhythm, normal heart sounds and intact distal pulses.   Pulmonary/Chest: Effort normal and breath sounds normal. He exhibits no bony tenderness.  Abdominal: Soft. Normal appearance. There is no tenderness.  Musculoskeletal: Normal range of motion.       Tender paravertebral musculature. Cervical, thoracic, and lumbar spine. No midline mass in the lumbar area. Normal range of motion, lumbar. Decreased neck flexion secondary to pain.  Neurological: He is alert and oriented to person, place,  and time. He has normal strength. No cranial nerve deficit or sensory deficit. He exhibits normal muscle tone. Coordination normal.  Skin: Skin is warm, dry and intact.  Psychiatric: He has a normal mood and affect. His behavior is normal. Judgment and thought content normal.    ED Course  Procedures (including critical care time) Emergency department treatment: Percocet oral   ED Treatment: Percocet  Reeval.- 11:30- pain is better.   Nursing notes, applicable records and vitals reviewed.  Radiologic Images/Reports reviewed.    1. Headache   2. Neck pain   3. Back pain       MDM  Nonspecific headache, doubt spinal fluid leak, acute, meningitis, or metabolic instability. He is stable for discharge with outpatient treatment.     Plan: Home Medications- Percocet, Valium; Home Treatments- rest; Recommended follow up- PCP of choice, when necessary     Flint Melter, MD 09/18/12 1140

## 2012-09-18 NOTE — ED Notes (Signed)
Headache for 3 days since he had a lp  3 days ago here and he has had some swelling in his back  Since the lp

## 2012-09-18 NOTE — ED Notes (Signed)
MD at bedside. 

## 2012-09-20 LAB — CULTURE, BLOOD (ROUTINE X 2)
Culture: NO GROWTH
Culture: NO GROWTH

## 2012-09-23 ENCOUNTER — Emergency Department (HOSPITAL_COMMUNITY): Payer: Medicare Other

## 2012-09-23 ENCOUNTER — Inpatient Hospital Stay (HOSPITAL_COMMUNITY)
Admission: EM | Admit: 2012-09-23 | Discharge: 2012-09-25 | DRG: 194 | Disposition: A | Discharge status: Home / Self Care | Payer: Medicare Other | Attending: Internal Medicine | Admitting: Internal Medicine

## 2012-09-23 ENCOUNTER — Encounter (HOSPITAL_COMMUNITY): Payer: Self-pay | Admitting: Emergency Medicine

## 2012-09-23 DIAGNOSIS — R05 Cough: Secondary | ICD-10-CM

## 2012-09-23 DIAGNOSIS — J45909 Unspecified asthma, uncomplicated: Secondary | ICD-10-CM

## 2012-09-23 DIAGNOSIS — J189 Pneumonia, unspecified organism: Principal | ICD-10-CM | POA: Diagnosis present

## 2012-09-23 DIAGNOSIS — I10 Essential (primary) hypertension: Secondary | ICD-10-CM

## 2012-09-23 DIAGNOSIS — R059 Cough, unspecified: Secondary | ICD-10-CM

## 2012-09-23 DIAGNOSIS — M545 Low back pain, unspecified: Secondary | ICD-10-CM | POA: Diagnosis present

## 2012-09-23 DIAGNOSIS — R739 Hyperglycemia, unspecified: Secondary | ICD-10-CM

## 2012-09-23 DIAGNOSIS — R6889 Other general symptoms and signs: Secondary | ICD-10-CM

## 2012-09-23 DIAGNOSIS — R079 Chest pain, unspecified: Secondary | ICD-10-CM

## 2012-09-23 DIAGNOSIS — G8929 Other chronic pain: Secondary | ICD-10-CM

## 2012-09-23 DIAGNOSIS — Z87891 Personal history of nicotine dependence: Secondary | ICD-10-CM

## 2012-09-23 DIAGNOSIS — Z886 Allergy status to analgesic agent status: Secondary | ICD-10-CM

## 2012-09-23 DIAGNOSIS — G032 Benign recurrent meningitis [Mollaret]: Secondary | ICD-10-CM | POA: Diagnosis present

## 2012-09-23 DIAGNOSIS — I2510 Atherosclerotic heart disease of native coronary artery without angina pectoris: Secondary | ICD-10-CM | POA: Diagnosis present

## 2012-09-23 DIAGNOSIS — R509 Fever, unspecified: Secondary | ICD-10-CM | POA: Diagnosis present

## 2012-09-23 DIAGNOSIS — A879 Viral meningitis, unspecified: Secondary | ICD-10-CM

## 2012-09-23 DIAGNOSIS — Z79899 Other long term (current) drug therapy: Secondary | ICD-10-CM

## 2012-09-23 DIAGNOSIS — Z888 Allergy status to other drugs, medicaments and biological substances status: Secondary | ICD-10-CM

## 2012-09-23 DIAGNOSIS — E876 Hypokalemia: Secondary | ICD-10-CM

## 2012-09-23 DIAGNOSIS — Z21 Asymptomatic human immunodeficiency virus [HIV] infection status: Secondary | ICD-10-CM

## 2012-09-23 DIAGNOSIS — B20 Human immunodeficiency virus [HIV] disease: Secondary | ICD-10-CM

## 2012-09-23 HISTORY — DX: Dyspnea, unspecified: R06.00

## 2012-09-23 HISTORY — DX: Migraine, unspecified, not intractable, without status migrainosus: G43.909

## 2012-09-23 HISTORY — DX: Chest pain, unspecified: R07.9

## 2012-09-23 HISTORY — DX: Other chronic pain: G89.29

## 2012-09-23 HISTORY — DX: Low back pain, unspecified: M54.50

## 2012-09-23 HISTORY — DX: Unspecified osteoarthritis, unspecified site: M19.90

## 2012-09-23 HISTORY — DX: Unspecified chronic bronchitis: J42

## 2012-09-23 HISTORY — DX: Other forms of dyspnea: R06.09

## 2012-09-23 HISTORY — DX: Low back pain: M54.5

## 2012-09-23 LAB — CBC WITH DIFFERENTIAL/PLATELET
Basophils Absolute: 0.1 10*3/uL (ref 0.0–0.1)
Basophils Relative: 1 % (ref 0–1)
Eosinophils Absolute: 0.1 10*3/uL (ref 0.0–0.7)
Eosinophils Relative: 1 % (ref 0–5)
HCT: 45 % (ref 39.0–52.0)
Hemoglobin: 14.3 g/dL (ref 13.0–17.0)
Lymphocytes Relative: 36 % (ref 12–46)
Lymphs Abs: 2 10*3/uL (ref 0.7–4.0)
MCH: 27 pg (ref 26.0–34.0)
MCHC: 31.8 g/dL (ref 30.0–36.0)
MCV: 85.1 fL (ref 78.0–100.0)
Monocytes Absolute: 0.6 10*3/uL (ref 0.1–1.0)
Monocytes Relative: 10 % (ref 3–12)
Neutro Abs: 2.8 10*3/uL (ref 1.7–7.7)
Neutrophils Relative %: 52 % (ref 43–77)
Platelets: 289 10*3/uL (ref 150–400)
RBC: 5.29 MIL/uL (ref 4.22–5.81)
RDW: 15.8 % — ABNORMAL HIGH (ref 11.5–15.5)
WBC: 5.5 10*3/uL (ref 4.0–10.5)

## 2012-09-23 LAB — CBC
HCT: 42.3 % (ref 39.0–52.0)
Hemoglobin: 13.9 g/dL (ref 13.0–17.0)
MCH: 27.7 pg (ref 26.0–34.0)
MCHC: 32.9 g/dL (ref 30.0–36.0)
MCV: 84.3 fL (ref 78.0–100.0)
Platelets: 230 10*3/uL (ref 150–400)
RBC: 5.02 MIL/uL (ref 4.22–5.81)
RDW: 15.8 % — ABNORMAL HIGH (ref 11.5–15.5)
WBC: 4.8 10*3/uL (ref 4.0–10.5)

## 2012-09-23 LAB — CREATININE, SERUM
Creatinine, Ser: 0.83 mg/dL (ref 0.50–1.35)
GFR calc Af Amer: >90 mL/min (ref >90)
GFR calc non Af Amer: >90 mL/min (ref >90)

## 2012-09-23 LAB — BASIC METABOLIC PANEL
BUN: 16 mg/dL (ref 6–23)
CO2: 31 mEq/L (ref 19–32)
Calcium: 10 mg/dL (ref 8.4–10.5)
Chloride: 102 mEq/L (ref 96–112)
Creatinine, Ser: 0.9 mg/dL (ref 0.50–1.35)
GFR calc Af Amer: >90 mL/min (ref >90)
GFR calc non Af Amer: >90 mL/min (ref >90)
Glucose, Bld: 91 mg/dL (ref 70–99)
Potassium: 4.9 mEq/L (ref 3.5–5.1)
Sodium: 141 mEq/L (ref 135–145)

## 2012-09-23 LAB — POCT I-STAT TROPONIN I: Troponin i, poc: 0 ng/mL (ref 0.00–0.08)

## 2012-09-23 MED ORDER — RAMIPRIL 2.5 MG PO CAPS
2.5000 mg | ORAL_CAPSULE | Freq: Two times a day (BID) | ORAL | Status: DC
  Administered 2012-09-23 – 2012-09-25 (×4): 2.5 mg via ORAL
  Filled 2012-09-23 (×6): qty 1

## 2012-09-23 MED ORDER — DIAZEPAM 5 MG PO TABS
10.0000 mg | ORAL_TABLET | Freq: Four times a day (QID) | ORAL | Status: DC | PRN
Start: 2012-09-23 — End: 2012-09-25

## 2012-09-23 MED ORDER — SENNOSIDES-DOCUSATE SODIUM 8.6-50 MG PO TABS: 1.0000 | ORAL_TABLET | Freq: Every evening | ORAL | Status: DC | PRN

## 2012-09-23 MED ORDER — METOPROLOL TARTRATE 12.5 MG HALF TABLET
12.5000 mg | ORAL_TABLET | Freq: Two times a day (BID) | ORAL | Status: DC
Start: 2012-09-23 — End: 2012-09-25
  Administered 2012-09-23 – 2012-09-25 (×4): 12.5 mg via ORAL
  Filled 2012-09-23 (×5): qty 1

## 2012-09-23 MED ORDER — DIPHENHYDRAMINE HCL 50 MG/ML IJ SOLN
25.0000 mg | Freq: Once | INTRAMUSCULAR | Status: AC
  Administered 2012-09-23: 25 mg via INTRAVENOUS
  Filled 2012-09-23: qty 1

## 2012-09-23 MED ORDER — LEVOFLOXACIN IN D5W 750 MG/150ML IV SOLN
750.0000 mg | INTRAVENOUS | Status: DC
  Administered 2012-09-24: 750 mg via INTRAVENOUS
  Filled 2012-09-23 (×2): qty 150

## 2012-09-23 MED ORDER — ACETAMINOPHEN 325 MG PO TABS
650.0000 mg | ORAL_TABLET | ORAL | Status: DC | PRN
  Administered 2012-09-23: 650 mg via ORAL

## 2012-09-23 MED ORDER — OXYCODONE HCL 5 MG PO TABS
5.0000 mg | ORAL_TABLET | ORAL | Status: DC | PRN
  Administered 2012-09-23 – 2012-09-24 (×2): 5 mg via ORAL
  Filled 2012-09-23 (×2): qty 1

## 2012-09-23 MED ORDER — ONDANSETRON HCL 4 MG/2ML IJ SOLN: 4.0000 mg | Freq: Four times a day (QID) | INTRAMUSCULAR | Status: DC | PRN

## 2012-09-23 MED ORDER — ADULT MULTIVITAMIN W/MINERALS CH
1.0000 | ORAL_TABLET | Freq: Every day | ORAL | Status: DC
  Administered 2012-09-24 – 2012-09-25 (×2): 1 via ORAL
  Filled 2012-09-23 (×3): qty 1

## 2012-09-23 MED ORDER — IPRATROPIUM BROMIDE 0.02 % IN SOLN
0.5000 mg | Freq: Once | RESPIRATORY_TRACT | Status: AC
  Administered 2012-09-23: 0.5 mg via RESPIRATORY_TRACT
  Filled 2012-09-23: qty 2.5

## 2012-09-23 MED ORDER — ONDANSETRON HCL 8 MG PO TABS
4.0000 mg | ORAL_TABLET | Freq: Four times a day (QID) | ORAL | Status: DC | PRN
  Filled 2012-09-23: qty 0.5

## 2012-09-23 MED ORDER — HYDROMORPHONE HCL PF 1 MG/ML IJ SOLN: 1.0000 mg | INTRAMUSCULAR | Status: DC | PRN

## 2012-09-23 MED ORDER — SODIUM CHLORIDE 0.9 % IV SOLN
INTRAVENOUS | Status: DC
  Administered 2012-09-23 – 2012-09-25 (×4): via INTRAVENOUS

## 2012-09-23 MED ORDER — OSELTAMIVIR PHOSPHATE 75 MG PO CAPS
75.0000 mg | ORAL_CAPSULE | Freq: Two times a day (BID) | ORAL | Status: DC
  Administered 2012-09-23: 75 mg via ORAL
  Filled 2012-09-23 (×3): qty 1

## 2012-09-23 MED ORDER — ENOXAPARIN SODIUM 40 MG/0.4ML ~~LOC~~ SOLN
40.0000 mg | SUBCUTANEOUS | Status: DC
  Administered 2012-09-24: 40 mg via SUBCUTANEOUS
  Filled 2012-09-23 (×3): qty 0.4

## 2012-09-23 MED ORDER — ONDANSETRON HCL 4 MG/2ML IJ SOLN: 4.0000 mg | Freq: Three times a day (TID) | INTRAMUSCULAR | Status: DC | PRN

## 2012-09-23 MED ORDER — ACETAMINOPHEN 325 MG PO TABS
650.0000 mg | ORAL_TABLET | Freq: Four times a day (QID) | ORAL | Status: DC | PRN
  Filled 2012-09-23: qty 2

## 2012-09-23 MED ORDER — FENTANYL CITRATE 0.05 MG/ML IJ SOLN
INTRAMUSCULAR | Status: AC
  Filled 2012-09-23: qty 2

## 2012-09-23 MED ORDER — ALBUTEROL SULFATE (5 MG/ML) 0.5% IN NEBU
5.0000 mg | INHALATION_SOLUTION | Freq: Once | RESPIRATORY_TRACT | Status: AC
  Administered 2012-09-23: 5 mg via RESPIRATORY_TRACT
  Filled 2012-09-23: qty 1

## 2012-09-23 MED ORDER — FENTANYL CITRATE 0.05 MG/ML IJ SOLN
100.0000 ug | Freq: Once | INTRAMUSCULAR | Status: AC
  Administered 2012-09-23: 100 ug via INTRAVENOUS

## 2012-09-23 MED ORDER — ACETAMINOPHEN 650 MG RE SUPP: 650.0000 mg | Freq: Four times a day (QID) | RECTAL | Status: DC | PRN

## 2012-09-23 NOTE — Progress Notes (Signed)
NURSING PROGRESS NOTE  Kenneth [name] [id number] Admission Data: 09/23/2012 6:47 PM Attending Provider: Memory Argue* [id number] Not In System Code Status: full  Kenneth [name] is a 57 y.o. male patient admitted from ED  No acute distress noted.  No c/o shortness of breath, no c/o chest pain.  MD paged to unit upon arrival per order.   Blood pressure 120/83, pulse 73, temperature 98.4 F (36.9 C), temperature source Oral, resp. rate 18, height 5' 8.5" (1.74 m), weight 69.4 kg (153 lb), SpO2 97.00%.   IV Fluids:  IV in place, occlusive dsg intact without redness, IV cath forearm right, condition patent and no redness none.   Allergies:  Cyclobenzaprine; Flexeril; Ibuprofen; Naproxen; Tramadol; Bee venom; Hydrocodone; Nabumetone; and Other  Past Medical History:   has a past medical history of HIV positive; Hypertension; Meningitis; Bronchitis; and Coronary artery disease.  Past Surgical History:   has past surgical history that includes Abdominal surgery; Back surgery; Appendectomy; and Elbow surgery.  Social History:   reports that he has been smoking Cigarettes.  He has a 16.5 pack-year smoking history. He has never used smokeless tobacco. He reports that he does not drink alcohol or use illicit drugs.  Skin: dry and intact  Orientation to room, and floor completed with information packet given to patient/family. Admission INP armband ID verified with patient/family, and in place.   SR up x 2 Call light within reach. Will cont to eval and treat per MD orders.  Rosalie Doctor, RN

## 2012-09-23 NOTE — ED Notes (Signed)
Pt c/o left sided CP worse with inspiration x ; pt sts coughing all night long

## 2012-09-23 NOTE — ED Provider Notes (Signed)
History     CSN: [id number]  Arrival date & time 09/23/12  1033   First MD Initiated Contact with Patient 09/23/12 1133      Chief Complaint  Patient presents with  . Chest Pain     HPI Pt c/o left sided CP worse with inspiration x ; pt sts coughing all night long.  Patient states he had a temperature last night that went up to 103.  Patient has had no fever today. Past Medical History  Diagnosis Date  . HIV positive 2004  . Hypertension   . Meningitis   . Coronary artery disease   . Chest pain at rest 09/23/2012  . Chronic bronchitis     "q year last 5 yr or so" (09/24/2012)  . Exertional dyspnea   . Migraines   . Arthritis     "both shoulders" (09/24/2012)  . Chronic lower back pain     Past Surgical History  Procedure Date  . Intussusception repair 10/2011  . Posterior lumbar fusion 1995  . Elbow surgery ~ 1997    "removed some stones; right" (09/24/2012)  . Appendectomy 2013    History reviewed. No pertinent family history.  History  Substance Use Topics  . Smoking status: Former Smoker -- 0.5 packs/day for 45 years    Types: Cigarettes    Quit date: 12/18/2011  . Smokeless tobacco: Never Used     Comment: 09/24/2012 "been stopped now ~ 8 month"  . Alcohol Use: No      Review of Systems All other systems reviewed and are negative Allergies  Cyclobenzaprine; Flexeril; Hydrocodone; Ibuprofen; Nabumetone; Naproxen; Tramadol; Bee venom; and Other  Home Medications   No current outpatient prescriptions on file.  BP 115/75  Pulse 66  Temp 97.8 F (36.6 C) (Oral)  Resp 16  Ht 5' 8.5" (1.74 m)  Wt 153 lb (69.4 kg)  BMI 22.93 kg/m2  SpO2 95%  Physical Exam  Nursing note and vitals reviewed. Constitutional: He is oriented to person, place, and time. He appears well-developed and well-nourished. No distress.  HENT:  Head: Normocephalic and atraumatic.  Eyes: Pupils are equal, round, and reactive to light.  Neck: Normal range of motion.    Cardiovascular: Normal rate and intact distal pulses.   Pulmonary/Chest: No respiratory distress. He has wheezes.  Abdominal: Normal appearance. He exhibits no distension.  Musculoskeletal: Normal range of motion.  Neurological: He is alert and oriented to person, place, and time. No cranial nerve deficit.  Skin: Skin is warm and dry. No rash noted.  Psychiatric: He has a normal mood and affect. His behavior is normal.    ED Course  Procedures (including critical care time)  Date: 09/23/2012  Rate: 102  Rhythm: Sinus tachycardia  QRS Axis: normal  Intervals: normal  ST/T Wave abnormalities: normal  Conduction Disutrbances: none  Narrative Interpretation: unremarkable     Labs Reviewed  CBC WITH DIFFERENTIAL - Abnormal; Notable for the following:    RDW 15.8 (*)     All other components within normal limits  HIV 1 RNA QUANT-NO REFLEX-BLD - Abnormal; Notable for the following:    HIV 1 RNA Quant 184 (*)     HIV1 RNA Quant, Log 2.26 (*)     All other components within normal limits  CBC - Abnormal; Notable for the following:    RDW 15.8 (*)     All other components within normal limits  BASIC METABOLIC PANEL - Abnormal; Notable for the following:  GFR calc non Af Amer 89 (*)     All other components within normal limits  CBC - Abnormal; Notable for the following:    RDW 15.9 (*)     All other components within normal limits  CBC - Abnormal; Notable for the following:    RDW 15.8 (*)     All other components within normal limits  BASIC METABOLIC PANEL - Abnormal; Notable for the following:    GFR calc non Af Amer 89 (*)     All other components within normal limits  BASIC METABOLIC PANEL  POCT I-STAT TROPONIN I  T-HELPER CELLS (CD4) COUNT  STREP PNEUMONIAE URINARY ANTIGEN  CREATININE, SERUM  INFLUENZA PANEL BY PCR  URINE RAPID DRUG SCREEN (HOSP PERFORMED)  CULTURE, BLOOD (ROUTINE X 2)  CULTURE, BLOOD (ROUTINE X 2)  CULTURE, EXPECTORATED SPUTUM-ASSESSMENT  GRAM  STAIN  LEGIONELLA ANTIGEN, URINE   Dg Chest 2 View  09/23/2012  *RADIOLOGY REPORT*  Clinical Data: Chest pain.  Short of breath.  Cough.  CHEST - 2 VIEW  Comparison: 08/26/2012  Findings: Heart size is normal.  Mediastinal shadows are normal. There is emphysema with hyperinflation and scarring.  Patchy density at the lung bases could represent scarring, but minimal patchy basilar pneumonia is not excluded.  No effusions.  No pulmonary edema.  No acute bony finding.  IMPRESSION: Pronounced emphysema.  Pulmonary scarring at the bases.  Cannot rule out patchy basilar infiltrate.   Original Report Authenticated By: Paulina Fusi, M.D.    Dg Chest Port 1 View  09/25/2012  *RADIOLOGY REPORT*  Clinical Data: Shortness of breath, cough and chest pain.  PORTABLE CHEST - 1 VIEW  Comparison: 09/23/2012 and 08/26/2012. CT abdomen pelvis 06/04/2011.  Findings: Trachea is midline.  Heart size normal.  There are scattered linear densities at the lung bases, as before.  Lungs appear emphysematous.  No pleural fluid.  IMPRESSION: Emphysema.  Scattered linear densities at the lung bases are likely due to scarring, when examination of 06/04/2011 is reviewed.  The possibility of superimposed fibrosis is considered.   Original Report Authenticated By: Leanna Battles, M.D.      #1 chest pain #2 febrile illness #3 HIV infection     MDM          Nelia Shi, MD 09/25/12 [phone]

## 2012-09-23 NOTE — Progress Notes (Signed)
ANTIBIOTIC CONSULT NOTE - INITIAL  Pharmacy Consult for antibiotics renal dose adjustment Indication: r/o Pneumonia  Allergies  Allergen Reactions  . Cyclobenzaprine Anaphylaxis  . Flexeril (Cyclobenzaprine Hcl) Shortness Of Breath and Rash  . Ibuprofen Shortness Of Breath and Rash    Stomach burning. Pt takes aspirin at home w/o problems.  . Naproxen Shortness Of Breath, Rash and Nausea And Vomiting  . Tramadol Shortness Of Breath and Rash    Multiple previous morphine administrations ok (07/12/11-DJ)  . Bee Venom Itching    throat  . Hydrocodone Itching  . Nabumetone     Pt states that is why he is here now. Likely has allergy to all NSAIDs  . Other Itching    To throat.(grits and grape jelly). coleslaw     Vital Signs: Temp: 98.4 F (36.9 C) (01/06 1823) Temp src: Oral (01/06 1823) BP: 120/83 mmHg (01/06 1823) Pulse Rate: 73  (01/06 1823) Intake/Output from previous day:   Intake/Output from this shift:    Labs:  Basename 09/23/12 1230  WBC 5.5  HGB 14.3  PLT 289  LABCREA --  CREATININE 0.90   The CrCl is unknown because both a height and weight (above a minimum accepted value) are required for this calculation. No results found for this basename: VANCOTROUGH:2,VANCOPEAK:2,VANCORANDOM:2,GENTTROUGH:2,GENTPEAK:2,GENTRANDOM:2,TOBRATROUGH:2,TOBRAPEAK:2,TOBRARND:2,AMIKACINPEAK:2,AMIKACINTROU:2,AMIKACIN:2, in the last 72 hours   Microbiology: Recent Results (from the past 720 hour(s))  CULTURE, BLOOD (ROUTINE X 2)     Status: Normal   Collection Time   08/26/12 11:44 AM      Component Value Range Status Comment   Specimen Description BLOOD LEFT ANTECUBITAL   Final    Special Requests BOTTLES DRAWN AEROBIC AND ANAEROBIC   Final    Culture  Setup Time 08/26/2012 16:17   Final    Culture NO GROWTH 5 DAYS   Final    Report Status 09/01/2012 FINAL   Final   CULTURE, BLOOD (ROUTINE X 2)     Status: Normal   Collection Time   08/26/12 11:51 AM      Component  Value Range Status Comment   Specimen Description BLOOD LEFT ARM   Final    Special Requests BOTTLES DRAWN AEROBIC AND ANAEROBIC   Final    Culture  Setup Time 08/26/2012 16:18   Final    Culture NO GROWTH 5 DAYS   Final    Report Status 09/01/2012 FINAL   Final   URINE CULTURE     Status: Normal   Collection Time   08/26/12 12:00 PM      Component Value Range Status Comment   Specimen Description URINE, CATHETERIZED   Final    Special Requests NONE   Final    Culture  Setup Time 08/26/2012 19:08   Final    Colony Count 10,000 COLONIES/ML   Final    Culture ENTEROCOCCUS SPECIES   Final    Report Status 08/29/2012 FINAL   Final    Organism ID, Bacteria ENTEROCOCCUS SPECIES   Final   CSF CULTURE     Status: Normal   Collection Time   08/26/12  2:15 PM      Component Value Range Status Comment   Specimen Description CSF   Final    Special Requests Immunocompromised   Final    Gram Stain     Final    Value: WBC PRESENT, PREDOMINANTLY MONONUCLEAR     NO ORGANISMS SEEN     Performed by [facility] Gram Stain Report Called to,Read  Back By and Verified With: Gram Stain Report Called to,Read Back By and Verified With:  M. MENGINNIS @17 :18 ON 08/26/12 BY MURPHY D   Culture NO GROWTH 3 DAYS   Final    Report Status 09/10/2012 FINAL   Final   GRAM STAIN     Status: Normal   Collection Time   08/26/12  2:15 PM      Component Value Range Status Comment   Specimen Description CSF   Final    Special Requests Immunocompromised   Final    Gram Stain     Final    Value: WBC PRESENT, PREDOMINANTLY MONONUCLEAR     NO ORGANISMS SEEN     Gram Stain Report Called to,Read Back By and Verified With: [email or web address]/[id number]   Report Status 08/26/2012 FINAL   Final   CULTURE, BLOOD (ROUTINE X 2)     Status: Normal   Collection Time   09/13/12  6:35 PM      Component Value Range Status Comment   Specimen Description BLOOD ARM LEFT   Final    Special Requests BOTTLES DRAWN AEROBIC AND  ANAEROBIC 10CC   Final    Culture  Setup Time 09/14/2012 02:44   Final    Culture NO GROWTH 5 DAYS   Final    Report Status 09/20/2012 FINAL   Final   CULTURE, BLOOD (ROUTINE X 2)     Status: Normal   Collection Time   09/13/12  6:45 PM      Component Value Range Status Comment   Specimen Description BLOOD HAND LEFT   Final    Special Requests BOTTLES DRAWN AEROBIC ONLY 10CC   Final    Culture  Setup Time 09/14/2012 02:44   Final    Culture NO GROWTH 5 DAYS   Final    Report Status 09/20/2012 FINAL   Final   CSF CULTURE     Status: Normal   Collection Time   09/13/12 10:21 PM      Component Value Range Status Comment   Specimen Description CSF   Final    Special Requests NONE   Final    Gram Stain     Final    Value: WBC PRESENT, PREDOMINANTLY MONONUCLEAR     NO ORGANISMS SEEN     CYTOSPUN Performed at [facility]   Culture NO GROWTH 3 DAYS   Final    Report Status 09/17/2012 FINAL   Final   GRAM STAIN     Status: Normal   Collection Time   09/13/12 10:22 PM      Component Value Range Status Comment   Specimen Description CSF   Final    Special Requests NONE   Final    Gram Stain     Final    Value: WBC PRESENT, PREDOMINANTLY MONONUCLEAR     NO ORGANISMS SEEN     CYTOSPIN SLIDE   Report Status 09/13/2012 FINAL   Final     Medical History: Past Medical History  Diagnosis Date  . HIV positive   . Hypertension   . Meningitis   . Bronchitis   . Coronary artery disease     Assessment: 29 YOM with hx of HIV, presented with non-productive cough and fever, started with levaquin and tamiflu x 5 days empirically to cover CAP and influenza. Pharmacy is consulted to adjust antibiotics dosing base on patient's renal function. His scr = 0.9, est. crcl ~ 72ml/min. Both Levaquin and Tamiflu dosages are appropriate. Pt.  Has never been on HAART medications before per note.   Plan:  - Continue levaquin and Tamiflu at current dose as ordered. - Pharmacy will sign off -  Please consider change levaquin to PO if improves clinically.  Bayard Hugger, PharmD, BCPS  Clinical Pharmacist  Pager: [phone]  09/23/2012,6:43 PM

## 2012-09-23 NOTE — H&P (Signed)
[facility]          History and Physical    PCP:   Provider Not In System   Chief Complaint:  Cough, fever  HPI: Kenneth [name] 57 y/o man with h/o HIV, HTN, and prior aseptic meningitis, comes in today with non-productive cough, left axillary CP related to his cough, fever of up to 103 last night, myalgias and a HA. His wife has been ill with similar symptoms. He has recently moved to [place] from [place]. We have been asked to admit him for further evaluation and management.  Allergies:   Allergies  Allergen Reactions  . Cyclobenzaprine Anaphylaxis  . Flexeril (Cyclobenzaprine Hcl) Shortness Of Breath and Rash  . Ibuprofen Shortness Of Breath and Rash    Stomach burning. Pt takes aspirin at home w/o problems.  . Naproxen Shortness Of Breath, Rash and Nausea And Vomiting  . Tramadol Shortness Of Breath and Rash    Multiple previous morphine administrations ok (07/12/11-DJ)  . Bee Venom Itching    throat  . Hydrocodone Itching  . Nabumetone     Pt states that is why he is here now. Likely has allergy to all NSAIDs  . Other Itching    To throat.(grits and grape jelly). coleslaw      Past Medical History  Diagnosis Date  . HIV positive   . Hypertension   . Meningitis   . Bronchitis   . Coronary artery disease     Past Surgical History  Procedure Date  . Abdominal surgery   . Back surgery   . Appendectomy   . Elbow surgery     Prior to Admission medications   Medication Sig Start Date End Date Taking? Authorizing Provider  diazepam (VALIUM) 10 MG tablet Take 1 tablet (10 mg total) by mouth every 6 (six) hours as needed (Muscle spasm). 09/18/12  Yes Flint Melter, MD  metoprolol tartrate (LOPRESSOR) 12.5 mg TABS Take 0.5 tablets (12.5 mg total) by mouth 2 (two) times daily. 08/29/12  Yes Cristal Ford, MD  Multiple Vitamin (MULITIVITAMIN WITH MINERALS) TABS Take 1 tablet by mouth daily.   Yes Historical Provider, MD  ramipril (ALTACE) 2.5 MG  capsule Take 1 capsule (2.5 mg total) by mouth 2 (two) times daily. 08/29/12  Yes Srikar Cherlynn Kaiser, MD  albuterol-ipratropium (COMBIVENT) 18-103 MCG/ACT inhaler Inhale 2 puffs into the lungs every 6 (six) hours as needed. For shortness of breath    Historical Provider, MD  oxyCODONE-acetaminophen (PERCOCET) 5-325 MG per tablet Take 1 tablet by mouth every 4 (four) hours as needed for pain. 09/18/12   Flint Melter, MD    Social History:  reports that he has been smoking Cigarettes.  He has a 16.5 pack-year smoking history. He has never used smokeless tobacco. He reports that he does not drink alcohol or use illicit drugs.  History reviewed. No pertinent family history.  Review of Systems:  Constitutional: Positive fever, chills, diaphoresis, appetite change and fatigue.  HEENT: Denies photophobia, eye pain, redness, hearing loss, ear pain, congestion, sore throat, rhinorrhea, sneezing, mouth sores, trouble swallowing, neck pain, neck stiffness and tinnitus.   Respiratory: Positive for SOB, DOE, cough, chest tightness. Cardiovascular: Denies  palpitations and leg swelling.  Gastrointestinal: Denies nausea, vomiting, abdominal pain, diarrhea, constipation, blood in stool and abdominal distention.  Genitourinary: Denies dysuria, urgency, frequency, hematuria, flank pain and difficulty urinating.  Musculoskeletal: Denies back pain, joint swelling, arthralgias and gait problem.  Skin: Denies pallor, rash and wound.  Neurological:  Denies dizziness, seizures, syncope, weakness, light-headedness, numbness and headaches.  Hematological: Denies adenopathy. Easy bruising, personal or family bleeding history  Psychiatric/Behavioral: Denies suicidal ideation, mood changes, confusion, nervousness, sleep disturbance and agitation   Physical Exam: Blood pressure 121/91, pulse 85, temperature 97.7 F (36.5 C), temperature source Oral, resp. rate 28, SpO2 95.00%. Gen: AA Ox3, appears acutely ill. HEENT:  [place]/AT/PERRL/EOMI/dry mucous membranes Neck: supple, no JVD, no LAD, no bruits, no goiter CV: RRR, no M/R/G Lungs: mild wheezes Abd: S/NT/ND/+BS/no masses or organomegaly. Ext: no C/C/E Neuro: grossly intact and non-focal.  Labs on Admission:  Results for orders placed during the hospital encounter of 09/23/12 (from the past 48 hour(s))  CBC WITH DIFFERENTIAL     Status: Abnormal   Collection Time   09/23/12 12:30 PM      Component Value Range Comment   WBC 5.5  4.0 - 10.5 K/uL    RBC 5.29  4.22 - 5.81 MIL/uL    Hemoglobin 14.3  13.0 - 17.0 g/dL    HCT 16.1  09.6 - 04.5 %    MCV 85.1  78.0 - 100.0 fL    MCH 27.0  26.0 - 34.0 pg    MCHC 31.8  30.0 - 36.0 g/dL    RDW 40.9 (*) 81.1 - 15.5 %    Platelets 289  150 - 400 K/uL    Neutrophils Relative 52  43 - 77 %    Neutro Abs 2.8  1.7 - 7.7 K/uL    Lymphocytes Relative 36  12 - 46 %    Lymphs Abs 2.0  0.7 - 4.0 K/uL    Monocytes Relative 10  3 - 12 %    Monocytes Absolute 0.6  0.1 - 1.0 K/uL    Eosinophils Relative 1  0 - 5 %    Eosinophils Absolute 0.1  0.0 - 0.7 K/uL    Basophils Relative 1  0 - 1 %    Basophils Absolute 0.1  0.0 - 0.1 K/uL   BASIC METABOLIC PANEL     Status: Normal   Collection Time   09/23/12 12:30 PM      Component Value Range Comment   Sodium 141  135 - 145 mEq/L    Potassium 4.9  3.5 - 5.1 mEq/L    Chloride 102  96 - 112 mEq/L    CO2 31  19 - 32 mEq/L    Glucose, Bld 91  70 - 99 mg/dL    BUN 16  6 - 23 mg/dL    Creatinine, Ser 9.14  0.50 - 1.35 mg/dL    Calcium 78.2  8.4 - 10.5 mg/dL    GFR calc non Af Amer >90  >90 mL/min    GFR calc Af Amer >90  >90 mL/min   POCT I-STAT TROPONIN I     Status: Normal   Collection Time   09/23/12 12:47 PM      Component Value Range Comment   Troponin i, poc 0.00  0.00 - 0.08 ng/mL    Comment 3              Radiological Exams on Admission: Dg Chest 2 View  09/23/2012  *RADIOLOGY REPORT*  Clinical Data: Chest pain.  Short of breath.  Cough.  CHEST - 2 VIEW   Comparison: 08/26/2012  Findings: Heart size is normal.  Mediastinal shadows are normal. There is emphysema with hyperinflation and scarring.  Patchy density at the lung bases could represent scarring, but minimal patchy basilar  pneumonia is not excluded.  No effusions.  No pulmonary edema.  No acute bony finding.  IMPRESSION: Pronounced emphysema.  Pulmonary scarring at the bases.  Cannot rule out patchy basilar infiltrate.   Original Report Authenticated By: Paulina Fusi, M.D.     Assessment/Plan Principal Problem:  *CAP (community acquired pneumonia) Active Problems:  Benign recurrent aseptic meningitis  HIV positive  HTN (hypertension)  Fever  Cough  Flu-like symptoms   Fever/Cough/Flu-like Symptoms -Suspicious for influenza. -Also has an infiltrate on CXR. -Will treat as CAP with levaquin. -Will also treat empirically with tamiflu pending flu screen. -Because of his h/o recurrent aseptic meningitis, I would have a low threshold of repeating LP if fever does not abate with treatment for CAP or his HA continues.  HIV -Per patient, last CD4 count was 518 in October. -As such, should not be considered immunosuppressed. -Will order CD4 count and viral load. -Has never been on HAART medications.  HTN -Continue metoprolol.  DVT Prophylaxis -Lovenox.   Time Spent on Admission: 75 minutes.  Chaya Jan [facility] Pager: [phone] 09/23/2012, 5:22 PM

## 2012-09-24 ENCOUNTER — Encounter (HOSPITAL_COMMUNITY): Payer: Self-pay | Admitting: General Practice

## 2012-09-24 DIAGNOSIS — Z21 Asymptomatic human immunodeficiency virus [HIV] infection status: Secondary | ICD-10-CM

## 2012-09-24 DIAGNOSIS — J45909 Unspecified asthma, uncomplicated: Secondary | ICD-10-CM

## 2012-09-24 LAB — BASIC METABOLIC PANEL
BUN: 17 mg/dL (ref 6–23)
CO2: 30 mEq/L (ref 19–32)
Calcium: 9.5 mg/dL (ref 8.4–10.5)
Chloride: 103 mEq/L (ref 96–112)
Creatinine, Ser: 0.99 mg/dL (ref 0.50–1.35)
GFR calc Af Amer: >90 mL/min (ref >90)
GFR calc non Af Amer: 89 mL/min — ABNORMAL LOW (ref >90)
Glucose, Bld: 90 mg/dL (ref 70–99)
Potassium: 3.5 mEq/L (ref 3.5–5.1)
Sodium: 139 mEq/L (ref 135–145)

## 2012-09-24 LAB — HIV-1 RNA QUANT-NO REFLEX-BLD
HIV 1 RNA Quant: 184 copies/mL — ABNORMAL HIGH (ref <20)
HIV-1 RNA Quant, Log: 2.26 {Log} — ABNORMAL HIGH (ref <1.30)

## 2012-09-24 LAB — INFLUENZA PANEL BY PCR (TYPE A & B)
H1N1 flu by pcr: NOT DETECTED
Influenza A By PCR: NEGATIVE
Influenza B By PCR: NEGATIVE

## 2012-09-24 LAB — CBC
HCT: 42.1 % (ref 39.0–52.0)
Hemoglobin: 13.6 g/dL (ref 13.0–17.0)
MCH: 27.3 pg (ref 26.0–34.0)
MCHC: 32.3 g/dL (ref 30.0–36.0)
MCV: 84.5 fL (ref 78.0–100.0)
Platelets: 263 10*3/uL (ref 150–400)
RBC: 4.98 MIL/uL (ref 4.22–5.81)
RDW: 15.9 % — ABNORMAL HIGH (ref 11.5–15.5)
WBC: 5.6 10*3/uL (ref 4.0–10.5)

## 2012-09-24 LAB — T-HELPER CELLS (CD4) COUNT (NOT AT ARMC)
CD4 % Helper T Cell: 39 % (ref 33–55)
CD4 T Cell Abs: 600 uL (ref 400–2700)

## 2012-09-24 LAB — STREP PNEUMONIAE URINARY ANTIGEN: Strep Pneumo Urinary Antigen: NEGATIVE

## 2012-09-24 MED ORDER — PHENOL 1.4 % MT LIQD
1.0000 | OROMUCOSAL | Status: DC | PRN
  Administered 2012-09-24: 1 via OROMUCOSAL
  Filled 2012-09-24: qty 177

## 2012-09-24 MED ORDER — FAMOTIDINE 10 MG PO TABS
10.0000 mg | ORAL_TABLET | Freq: Every day | ORAL | Status: DC
  Administered 2012-09-24 – 2012-09-25 (×2): 10 mg via ORAL
  Filled 2012-09-24 (×2): qty 1

## 2012-09-24 MED ORDER — ALBUTEROL SULFATE (5 MG/ML) 0.5% IN NEBU
2.5000 mg | INHALATION_SOLUTION | Freq: Three times a day (TID) | RESPIRATORY_TRACT | Status: DC
  Administered 2012-09-24 – 2012-09-25 (×3): 2.5 mg via RESPIRATORY_TRACT
  Filled 2012-09-24 (×2): qty 0.5

## 2012-09-24 MED ORDER — IPRATROPIUM BROMIDE 0.02 % IN SOLN
0.5000 mg | Freq: Three times a day (TID) | RESPIRATORY_TRACT | Status: DC
  Administered 2012-09-24 – 2012-09-25 (×3): 0.5 mg via RESPIRATORY_TRACT
  Filled 2012-09-24 (×2): qty 2.5

## 2012-09-24 MED ORDER — OXYCODONE-ACETAMINOPHEN 5-325 MG PO TABS
1.0000 | ORAL_TABLET | ORAL | Status: DC | PRN
  Administered 2012-09-24 – 2012-09-25 (×5): 1 via ORAL
  Filled 2012-09-24 (×5): qty 1

## 2012-09-24 MED ORDER — MENTHOL 3 MG MT LOZG
1.0000 | LOZENGE | OROMUCOSAL | Status: DC | PRN
  Administered 2012-09-24: 3 mg via ORAL
  Filled 2012-09-24: qty 9

## 2012-09-24 NOTE — Progress Notes (Signed)
[facility] PROGRESS NOTE  Kenneth [name] ZOX:[id number] DOB: [birth year] DOA: 09/23/2012 PCP: Provider Not In System  Assessment/Plan: Fever/Cough/Flu-like Symptoms  -likely PNA-as suspicion for nfiltrate on CXR.  -Influenza panel negative -Will treat as CAP with levaquin.  -Discontinue  tamiflu pending flu screen.  -Because of his h/o recurrent aseptic meningitis,  would have a low threshold of repeating LP if fever does not abate with treatment for CAP or his HA continues.  CAP -infiltrate on chest xray -levaquin day #2 -afebrile, no WC. Non-toxic appearing  HIV  -Per patient, last CD4 count was 518 in October.  -CD4 count and viral load pending -has been established with [facility] here-will consult ID if he does not defervesce  HTN  -controlled -Continue metoprolol and Ramipril  Code Status: full Family Communication:  Disposition Plan: home when stable hopefully 24 hours   Consultants:  none  Procedures:  none  Antibiotics:  Levaquin 09/23/12 >>>  HPI/Subjective: Ambulating in room with steady gait. Reports 'sore throat" and itching with pain med. Requests percocet. Complained back/neck pain  Objective: Filed Vitals:   09/23/12 1836 09/23/12 2101 09/23/12 2136 09/24/12 0554  BP:  120/78 110/72 121/79  Pulse:   73 71  Temp:   97.7 F (36.5 C) 97.4 F (36.3 C)  TempSrc:   Oral Oral  Resp:   16 16  Height: 5' 8.5" (1.74 m)     Weight: 69.4 kg (153 lb)     SpO2:   95% 97%    Intake/Output Summary (Last 24 hours) at 09/24/12 0836 Last data filed at 09/24/12 0600  Gross per 24 hour  Intake 666.25 ml  Output    300 ml  Net 366.25 ml   Filed Weights   09/23/12 1836  Weight: 69.4 kg (153 lb)    Exam:   General:  Ambulating in room NAD  Cardiovascular: RRR No MGR No LEE  Respiratory: normal effort BSCTAB somewhat diminished on right  Abdomen: soft +BS non-tender to palpation  Data Reviewed: Basic Metabolic Panel:  Lab 09/24/12 8119  09/23/12 1907 09/23/12 1230  NA 139 -- 141  K 3.5 -- 4.9  CL 103 -- 102  CO2 30 -- 31  GLUCOSE 90 -- 91  BUN 17 -- 16  CREATININE 0.99 0.83 0.90  CALCIUM 9.5 -- 10.0  MG -- -- --  PHOS -- -- --   Liver Function Tests: No results found for this basename: AST:5,ALT:5,ALKPHOS:5,BILITOT:5,PROT:5,ALBUMIN:5 in the last 168 hours No results found for this basename: LIPASE:5,AMYLASE:5 in the last 168 hours No results found for this basename: AMMONIA:5 in the last 168 hours CBC:  Lab 09/24/12 0435 09/23/12 1907 09/23/12 1230  WBC 5.6 4.8 5.5  NEUTROABS -- -- 2.8  HGB 13.6 13.9 14.3  HCT 42.1 42.3 45.0  MCV 84.5 84.3 85.1  PLT 263 230 289   Cardiac Enzymes: No results found for this basename: CKTOTAL:5,CKMB:5,CKMBINDEX:5,TROPONINI:5 in the last 168 hours BNP (last 3 results) No results found for this basename: PROBNP:3 in the last 8760 hours CBG: No results found for this basename: GLUCAP:5 in the last 168 hours  No results found for this or any previous visit (from the past 240 hour(s)).   Studies: Dg Chest 2 View  09/23/2012  *RADIOLOGY REPORT*  Clinical Data: Chest pain.  Short of breath.  Cough.  CHEST - 2 VIEW  Comparison: 08/26/2012  Findings: Heart size is normal.  Mediastinal shadows are normal. There is emphysema with hyperinflation and scarring.  Patchy density at  the lung bases could represent scarring, but minimal patchy basilar pneumonia is not excluded.  No effusions.  No pulmonary edema.  No acute bony finding.  IMPRESSION: Pronounced emphysema.  Pulmonary scarring at the bases.  Cannot rule out patchy basilar infiltrate.   Original Report Authenticated By: Paulina Fusi, M.D.     Scheduled Meds:   . enoxaparin (LOVENOX) injection  40 mg Subcutaneous Q24H  . famotidine  10 mg Oral Daily  . levofloxacin (LEVAQUIN) IV  750 mg Intravenous Q24H  . metoprolol tartrate  12.5 mg Oral BID  . multivitamin with minerals  1 tablet Oral Daily  . oseltamivir  75 mg Oral BID  .  ramipril  2.5 mg Oral BID   Continuous Infusions:   . sodium chloride 75 mL/hr at 09/23/12 2107    Principal Problem:  *CAP (community acquired pneumonia) Active Problems:  Benign recurrent aseptic meningitis  HIV positive  HTN (hypertension)  Fever  Cough  Flu-like symptoms    Time spent: 30 minutes    [facility] M  [facility]  If 8PM-8AM, please contact night-coverage [email or web address], password [facility] 09/24/2012, 8:36 AM  LOS: 1 day    Attending -Patient seen and examined. I have reviewed the above documentation, and have made the necessary changes. He is doing better, afebrile. He had chest tightness/SOB yesterday-which has now resolved, only minimal wheezing on exam. He likely has underlying COPD as well.Will start scheduled nebs while here, continue with Levaquin. Await blood cultures. If continues to do well, likely can be discharged in the next 1-2 days  Windell Norfolk MD

## 2012-09-25 ENCOUNTER — Inpatient Hospital Stay (HOSPITAL_COMMUNITY): Payer: Medicare Other

## 2012-09-25 ENCOUNTER — Ambulatory Visit: Payer: Medicare Other | Admitting: Internal Medicine

## 2012-09-25 ENCOUNTER — Ambulatory Visit (INDEPENDENT_AMBULATORY_CARE_PROVIDER_SITE_OTHER): Payer: Medicare Other | Admitting: Internal Medicine

## 2012-09-25 ENCOUNTER — Encounter: Payer: Self-pay | Admitting: Internal Medicine

## 2012-09-25 ENCOUNTER — Ambulatory Visit
Admission: RE | Admit: 2012-09-25 | Discharge: 2012-09-25 | Disposition: A | Discharge status: Home / Self Care | Payer: Medicare Other | Source: Ambulatory Visit | Attending: Internal Medicine | Admitting: Internal Medicine

## 2012-09-25 VITALS — BP 119/85 | HR 87 | Temp 97.4°F | Ht 69.0 in | Wt 158.0 lb

## 2012-09-25 DIAGNOSIS — G8929 Other chronic pain: Secondary | ICD-10-CM

## 2012-09-25 DIAGNOSIS — M545 Low back pain, unspecified: Secondary | ICD-10-CM

## 2012-09-25 DIAGNOSIS — I2510 Atherosclerotic heart disease of native coronary artery without angina pectoris: Secondary | ICD-10-CM | POA: Insufficient documentation

## 2012-09-25 DIAGNOSIS — Z21 Asymptomatic human immunodeficiency virus [HIV] infection status: Secondary | ICD-10-CM

## 2012-09-25 DIAGNOSIS — R509 Fever, unspecified: Secondary | ICD-10-CM

## 2012-09-25 DIAGNOSIS — M542 Cervicalgia: Secondary | ICD-10-CM

## 2012-09-25 LAB — CBC
HCT: 42.5 % (ref 39.0–52.0)
Hemoglobin: 13.5 g/dL (ref 13.0–17.0)
MCH: 26.8 pg (ref 26.0–34.0)
MCHC: 31.8 g/dL (ref 30.0–36.0)
MCV: 84.5 fL (ref 78.0–100.0)
Platelets: 260 10*3/uL (ref 150–400)
RBC: 5.03 MIL/uL (ref 4.22–5.81)
RDW: 15.8 % — ABNORMAL HIGH (ref 11.5–15.5)
WBC: 5.9 10*3/uL (ref 4.0–10.5)

## 2012-09-25 LAB — BASIC METABOLIC PANEL
BUN: 14 mg/dL (ref 6–23)
CO2: 27 mEq/L (ref 19–32)
Calcium: 9.3 mg/dL (ref 8.4–10.5)
Chloride: 103 mEq/L (ref 96–112)
Creatinine, Ser: 0.99 mg/dL (ref 0.50–1.35)
GFR calc Af Amer: >90 mL/min (ref >90)
GFR calc non Af Amer: 89 mL/min — ABNORMAL LOW (ref >90)
Glucose, Bld: 93 mg/dL (ref 70–99)
Potassium: 4.2 mEq/L (ref 3.5–5.1)
Sodium: 138 mEq/L (ref 135–145)

## 2012-09-25 LAB — LEGIONELLA ANTIGEN, URINE: Legionella Antigen, Urine: NEGATIVE

## 2012-09-25 MED ORDER — FAMOTIDINE 10 MG PO TABS: 10.0000 mg | ORAL_TABLET | Freq: Every day | ORAL | Status: DC

## 2012-09-25 MED ORDER — LEVOFLOXACIN 750 MG PO TABS: 750.0000 mg | ORAL_TABLET | Freq: Every day | ORAL | Status: DC

## 2012-09-25 MED ORDER — NAPROXEN 375 MG PO TABS: 375.0000 mg | ORAL_TABLET | Freq: Two times a day (BID) | ORAL | Status: DC

## 2012-09-25 MED ORDER — MENTHOL 3 MG MT LOZG: 1.0000 | LOZENGE | OROMUCOSAL | Status: DC | PRN

## 2012-09-25 MED ORDER — SENNOSIDES-DOCUSATE SODIUM 8.6-50 MG PO TABS: 1.0000 | ORAL_TABLET | Freq: Every evening | ORAL | Status: DC | PRN

## 2012-09-25 MED ORDER — LEVOFLOXACIN 750 MG PO TABS
750.0000 mg | ORAL_TABLET | Freq: Every day | ORAL | Status: DC
  Administered 2012-09-25: 750 mg via ORAL
  Filled 2012-09-25: qty 1

## 2012-09-25 NOTE — Progress Notes (Signed)
Patient ID: Kenneth [name], male   DOB: 1956/09/01, 57 y.o.   MRN: [id number]     [facility]  Patient Active Problem List  Diagnosis  . Benign recurrent aseptic meningitis  . HIV positive  . HTN (hypertension)  . Asthma  . CAP (community acquired pneumonia)  . Coronary artery disease  . Chronic lower back pain  . Chronic midline posterior neck pain    Patient's Medications  New Prescriptions   NAPROXEN ([name]) 375 MG TABLET    Take 1 tablet (375 mg total) by mouth 2 (two) times daily with a meal.  Previous Medications   ALBUTEROL-IPRATROPIUM (COMBIVENT) 18-103 MCG/ACT INHALER    Inhale 2 puffs into the lungs every 6 (six) hours as needed. For shortness of breath   DIAZEPAM (VALIUM) 10 MG TABLET    Take 1 tablet (10 mg total) by mouth every 6 (six) hours as needed (Muscle spasm).   FAMOTIDINE (PEPCID) 10 MG TABLET    Take 1 tablet (10 mg total) by mouth daily.   METOPROLOL TARTRATE (LOPRESSOR) 12.5 MG TABS    Take 0.5 tablets (12.5 mg total) by mouth 2 (two) times daily.   MULTIPLE VITAMIN (MULITIVITAMIN WITH MINERALS) TABS    Take 1 tablet by mouth daily.   RAMIPRIL (ALTACE) 2.5 MG CAPSULE    Take 1 capsule (2.5 mg total) by mouth 2 (two) times daily.   SENNA-DOCUSATE (SENOKOT-S) 8.6-50 MG PER TABLET    Take 1 tablet by mouth at bedtime as needed.  Modified Medications   No medications on file  Discontinued Medications   LEVOFLOXACIN (LEVAQUIN) 750 MG TABLET    Take 1 tablet (750 mg total) by mouth daily.   MENTHOL-CETYLPYRIDINIUM (CEPACOL) 3 MG LOZENGE    Take 1 lozenge (3 mg total) by mouth as needed.   OXYCODONE-ACETAMINOPHEN (PERCOCET) 5-325 MG PER TABLET    Take 1 tablet by mouth every 4 (four) hours as needed for pain.    Subjective: Kenneth [name] is in for his hospital followup visit. He has been rehospitalized on 2 occasions over the last few weeks. He went back in the hospital with persistent neck pain and headache. He underwent a repeat lumbar puncture  which was normal. Spinal fluid was submitted for herpes simplex PCR and this was negative. He was readmitted last week with cough, temperature of 103 and chest pain. His chest x-ray did not reveal any obvious infiltrates but he was treated empirically for community-acquired pneumonia with levofloxacin and improved. This morning he completed one week of total levofloxacin therapy. He is feeling much better but has persistent neck pain. It is worse when he first gets up in the morning and gets better throughout the course of the morning particularly if he stands under a hot shower. He ran out of his oxycodone. He has been taking Motrin but finds that it does not help for very long.  Objective: Temp: 97.4 F (36.3 C) (01/08 1504) Temp src: Oral (01/08 1504) BP: 119/85 mmHg (01/08 1504) Pulse Rate: 87  (01/08 1504)  General: He is alert and in no distress other than in mild discomfort from his neck pain Skin: No rash Neck: Point tenderness over her upper cervical spine Lungs: Clear Cor: Regular S1 and S2 with no murmurs  Lab Results HIV 1 RNA Quant (copies/mL)  Date Value  09/23/2012 184*  08/26/2012 73*     CD4 T Cell Abs (cmm)  Date Value  09/23/2012 600   08/26/2012 760  Assessment: He has asymptomatic HIV infection and is not currently on antiretroviral therapy. I will have him return in 1 week to review his records from [place] that his wife keeps.  He did have recurrent meningitis on the first of his 3 recent admissions. On that first admission, unfortunately, spinal fluid was not submitted for herpes simplex PCR. When he was readmitted they did send fluid for herpes PCR and it was negative but it may have been negative because he did not have evidence of recurrent meningitis at that time.  Not sure if he had a bronchitis or truly had to be required pneumonia recently but his respiratory symptoms and fever have resolved.  He has persistent neck pain that could be  do to degenerative arthritis. I have suggested that he try [name]. I will check a 2 view x-rays of his cervical spine and see him back in one week  I've also asked him to bring back in the list of primary care physicians that we have given him at his last visit so I can help him choose a new primary care physician.  Plan: 1. Try [name] for her neck pain 2. Cervical spine x-rays 3. Return in one week to review x-ray results, clinical exam and old medical records   Cliffton Asters, MD [facility] [phone] pager   [phone] cell 09/25/2012, 3:26 PM

## 2012-09-25 NOTE — Progress Notes (Signed)
Pt. discharged to floor,verbalized understanding of discharged instruction,medication,restriction,diet and follow up appointment.Baseline Vitals sign stable,Pt comfortable,no sign and symptom of distress. 

## 2012-09-25 NOTE — Discharge Summary (Signed)
Physician Discharge Summary  Kenneth [name] ZHY:[id number] DOB: 28-May-1956 DOA: 09/23/2012  PCP: Provider Not In System  Admit date: 09/23/2012 Discharge date: 09/25/2012  Time spent: 40 minutes  Recommendations for Outpatient Follow-up:  1. Follow up with PCP or [facility] urgent care in 1 week 2. Complete antibiotics as prescribed 3. Follow up with ID clinic as schedule pre-admission  Discharge Diagnoses:  Principal Problem:  *CAP (community acquired pneumonia) Active Problems:  Benign recurrent aseptic meningitis  HIV positive  HTN (hypertension)  Fever  Cough  Flu-like symptoms   Discharge Condition: stable  Diet recommendation: regular  Filed Weights   09/23/12 1836  Weight: 69.4 kg (153 lb)    History of present illness:  Pleasant 57 y/o man with h/o HIV, HTN, and prior aseptic meningitis, presented to ED on 09/23/12 with non-productive cough, left axillary CP related to his cough, fever of up to 103 night prior to presentation, myalgias and a HA.  He reported his wife had been ill with similar symptoms. He recently moved to [place] from [place]. We have been asked to admit him for further evaluation and management   Hospital Course:  Fever/Cough/Flu-like Symptoms  Pt admitted to floor. Probable PNA-as suspicion for nfiltrate on CXR.  -Influenza panel negative and consequently tamiflu discontinued -blood cultures neg -pt received levaquin IV for 2 days and will be discharged with po  -Because of his h/o recurrent aseptic meningitis, would have a low threshold of repeating LP if fever does not abate with treatment for CAP  -pt responded positively to treatment. Remained afebrile with normal white count during hospitalization. Will discharge to home for follow up with PCP in [place] in 1 week or [facility] urgent care CAP  -infiltrate on chest xray , see problem #1 -afebrile, no WC. Non-toxic appearing on day of discharge -some mild wheezing resolved with nebs -blood cultures neg HIV    -Per patient, last CD4 count was 518 in October. At discharge count 600 -has been established with ID Clinic here-will continue to follow pre-admission appointments  HTN  -controlled  -Continue metoprolol and Ramipril  Chronic lower back pain -at baseline during this hospitalization -pt ambulated in room/hall without problem -continue home pain med regimen at discharge CAD -no chest pain during this hospitalization -continue home meds at discharge    Procedures:  Consultations:  none  Discharge Exam: Filed Vitals:   09/24/12 2227 09/25/12 0423 09/25/12 0926 09/25/12 0933  BP: 110/72 115/75 110/75   Pulse:  66    Temp:  97.8 F (36.6 C)    TempSrc:  Oral    Resp:  16    Height:      Weight:      SpO2:  95%  97%    General: ambulating in room gait steady NAD Cardiovascular: RRR No MGR No LEE Respiratory: normal effort. BS somewhat distant. No wheeze/rhonchi  Discharge Instructions      Discharge Orders    Future Appointments: Provider: Department: Dept Phone: Center:   09/25/2012 3:15 PM Cliffton Asters, MD [facility] [phone] [facility]   10/14/2012 2:45 PM Christen Bame, MD [facility] [phone] [facility]     Future Orders Please Complete By Expires   Diet - low sodium heart healthy      Increase activity slowly      Call MD for:  severe uncontrolled pain      Call MD for:  difficulty breathing, headache or visual disturbances  Medication List     As of 09/25/2012 12:49 PM    TAKE these medications         COMBIVENT 18-103 MCG/ACT inhaler   Generic drug: albuterol-ipratropium   Inhale 2 puffs into the lungs every 6 (six) hours as needed. For shortness of breath      diazepam 10 MG tablet   Commonly known as: VALIUM   Take 1 tablet (10 mg total) by mouth every 6 (six) hours as needed (Muscle spasm).      famotidine 10 MG tablet   Commonly known as: PEPCID   Take 1 tablet (10 mg total) by  mouth daily.      levofloxacin 750 MG tablet   Commonly known as: LEVAQUIN   Take 1 tablet (750 mg total) by mouth daily.      menthol-cetylpyridinium 3 MG lozenge   Commonly known as: CEPACOL   Take 1 lozenge (3 mg total) by mouth as needed.      metoprolol tartrate 12.5 mg Tabs   Commonly known as: LOPRESSOR   Take 0.5 tablets (12.5 mg total) by mouth 2 (two) times daily.      multivitamin with minerals Tabs   Take 1 tablet by mouth daily.      oxyCODONE-acetaminophen 5-325 MG per tablet   Commonly known as: PERCOCET/ROXICET   Take 1 tablet by mouth every 4 (four) hours as needed for pain.      ramipril 2.5 MG capsule   Commonly known as: ALTACE   Take 1 capsule (2.5 mg total) by mouth 2 (two) times daily.      senna-docusate 8.6-50 MG per tablet   Commonly known as: Senokot-S   Take 1 tablet by mouth at bedtime as needed.        Follow-up Information    Follow up with [facility]. Schedule an appointment as soon as possible for a visit in 1 week. (patient to call and schedule an apt, bring $20 co pay with you)    Contact information:   [address] [place] [address] [phone]          The results of significant diagnostics from this hospitalization (including imaging, microbiology, ancillary and laboratory) are listed below for reference.    Significant Diagnostic Studies: Dg Chest 2 View  09/23/2012  *RADIOLOGY REPORT*  Clinical Data: Chest pain.  Short of breath.  Cough.  CHEST - 2 VIEW  Comparison: 08/26/2012  Findings: Heart size is normal.  Mediastinal shadows are normal. There is emphysema with hyperinflation and scarring.  Patchy density at the lung bases could represent scarring, but minimal patchy basilar pneumonia is not excluded.  No effusions.  No pulmonary edema.  No acute bony finding.  IMPRESSION: Pronounced emphysema.  Pulmonary scarring at the bases.  Cannot rule out patchy basilar infiltrate.   Original Report Authenticated  By: Paulina Fusi, M.D.    Ct Head Wo Contrast  09/13/2012  *RADIOLOGY REPORT*  Clinical Data: Neck pain,  CT HEAD WITHOUT CONTRAST  Technique:  Contiguous axial images were obtained from the base of the skull through the vertex without contrast.  Comparison: 08/26/2012  Findings: The brain has a normal appearance without evidence for hemorrhage, infarction, hydrocephalus, or mass lesion.  There is no extra axial fluid collection.  The skull and paranasal sinuses are normal.  IMPRESSION:  1.  Normal brain   Original Report Authenticated By: Signa Kell, M.D.    Ct Head Wo Contrast  08/26/2012  *  RADIOLOGY REPORT*  Clinical Data: Headache.  HIV.  Rule out meningitis  CT HEAD WITHOUT CONTRAST  Technique:  Contiguous axial images were obtained from the base of the skull through the vertex without contrast.  Comparison: None  Findings: Ventricle size is normal.  Negative for intracranial hemorrhage.  Negative for infarct mass or edema.  No evidence of tumor or infection.  Calvarium is intact.  Paranasal sinuses are clear.  Left parietal dermal cyst compatible with a sebaceous cyst.  IMPRESSION: No acute intracranial abnormality.   Original Report Authenticated By: Janeece Riggers, M.D.    Dg Chest Port 1 View  09/25/2012  *RADIOLOGY REPORT*  Clinical Data: Shortness of breath, cough and chest pain.  PORTABLE CHEST - 1 VIEW  Comparison: 09/23/2012 and 08/26/2012. CT abdomen pelvis 06/04/2011.  Findings: Trachea is midline.  Heart size normal.  There are scattered linear densities at the lung bases, as before.  Lungs appear emphysematous.  No pleural fluid.  IMPRESSION: Emphysema.  Scattered linear densities at the lung bases are likely due to scarring, when examination of 06/04/2011 is reviewed.  The possibility of superimposed fibrosis is considered.   Original Report Authenticated By: Leanna Battles, M.D.    Dg Lumbar Puncture Fluoro Guide  08/26/2012  *RADIOLOGY REPORT*  Clinical Data:  HIV , neck pain,  headache, concern for meningitis.  DIAGNOSTIC LUMBAR PUNCTURE UNDER FLUOROSCOPIC GUIDANCE  Fluoroscopy time:  1.0 minutes.  Technique:  Informed consent was obtained from the patient prior to the procedure, including potential complications of headache, allergy, and pain.   With the patient prone, the lower back was prepped with Betadine.  1% Lidocaine was used for local anesthesia. Lumbar puncture was performed at the L3-L4 level using a 20 gauge needle with return of clear CSF with an opening pressure of 16 cm water.   9  ml of CSF were obtained for laboratory studies.  The patient tolerated the procedure well and there were no apparent complications.  IMPRESSION: Successful lumbar puncture.   Original Report Authenticated By: Genevive Bi, M.D.     Microbiology: Recent Results (from the past 240 hour(s))  CULTURE, BLOOD (ROUTINE X 2)     Status: Normal (Preliminary result)   Collection Time   09/23/12  6:30 PM      Component Value Range Status Comment   Specimen Description BLOOD LEFT ARM   Final    Special Requests BOTTLES DRAWN AEROBIC AND ANAEROBIC 10CC   Final    Culture  Setup Time 09/24/2012 01:53   Final    Culture     Final    Value:        BLOOD CULTURE RECEIVED NO GROWTH TO DATE CULTURE WILL BE HELD FOR 5 DAYS BEFORE ISSUING A FINAL NEGATIVE REPORT   Report Status PENDING   Incomplete   CULTURE, BLOOD (ROUTINE X 2)     Status: Normal (Preliminary result)   Collection Time   09/23/12  6:40 PM      Component Value Range Status Comment   Specimen Description BLOOD LEFT ARM   Final    Special Requests BOTTLES DRAWN AEROBIC AND ANAEROBIC 10CC   Final    Culture  Setup Time 09/24/2012 01:53   Final    Culture     Final    Value:        BLOOD CULTURE RECEIVED NO GROWTH TO DATE CULTURE WILL BE HELD FOR 5 DAYS BEFORE ISSUING A FINAL NEGATIVE REPORT   Report Status PENDING   Incomplete  Labs: Basic Metabolic Panel:  Lab 09/25/12 1610 09/24/12 0435 09/23/12 1907 09/23/12 1230  NA  138 139 -- 141  K 4.2 3.5 -- 4.9  CL 103 103 -- 102  CO2 27 30 -- 31  GLUCOSE 93 90 -- 91  BUN 14 17 -- 16  CREATININE 0.99 0.99 0.83 0.90  CALCIUM 9.3 9.5 -- 10.0  MG -- -- -- --  PHOS -- -- -- --   Liver Function Tests: No results found for this basename: AST:5,ALT:5,ALKPHOS:5,BILITOT:5,PROT:5,ALBUMIN:5 in the last 168 hours No results found for this basename: LIPASE:5,AMYLASE:5 in the last 168 hours No results found for this basename: AMMONIA:5 in the last 168 hours CBC:  Lab 09/25/12 0602 09/24/12 0435 09/23/12 1907 09/23/12 1230  WBC 5.9 5.6 4.8 5.5  NEUTROABS -- -- -- 2.8  HGB 13.5 13.6 13.9 14.3  HCT 42.5 42.1 42.3 45.0  MCV 84.5 84.5 84.3 85.1  PLT 260 263 230 289   Cardiac Enzymes: No results found for this basename: CKTOTAL:5,CKMB:5,CKMBINDEX:5,TROPONINI:5 in the last 168 hours BNP: BNP (last 3 results) No results found for this basename: PROBNP:3 in the last 8760 hours CBG: No results found for this basename: GLUCAP:5 in the last 168 hours     Signed:  Gwenyth Bender  [facility] 09/25/2012, 12:49 PM

## 2012-09-25 NOTE — Discharge Summary (Signed)
Addendum  Patient seen and examined, chart and data base reviewed.  I agree with the above assessment and plan.  For full details please see Mrs. Toya Smothers NP note.  Community-acquired pneumonia, patchy bibasilar infiltrates. Has history of recurrent aseptic meningitis, 042  Treated with Levaquin, has negative flu PCR. Patient to followup with Dr. Orvan Falconer as outpatient   Clint Lipps, MD [facility] Pager: [phone] 09/25/2012, 2:06 PM

## 2012-09-26 ENCOUNTER — Telehealth: Payer: Self-pay | Admitting: *Deleted

## 2012-09-26 ENCOUNTER — Telehealth: Payer: Self-pay | Admitting: Internal Medicine

## 2012-09-26 ENCOUNTER — Other Ambulatory Visit: Payer: Self-pay | Admitting: Internal Medicine

## 2012-09-26 ENCOUNTER — Encounter: Payer: Self-pay | Admitting: *Deleted

## 2012-09-26 DIAGNOSIS — G8929 Other chronic pain: Secondary | ICD-10-CM

## 2012-09-26 MED ORDER — ACETAMINOPHEN 500 MG PO TABS: 1000.0000 mg | ORAL_TABLET | Freq: Four times a day (QID) | ORAL | Status: DC | PRN

## 2012-09-26 MED ORDER — ACETAMINOPHEN-CODEINE #3 300-30 MG PO TABS: 1.0000 | ORAL_TABLET | ORAL | Status: DC | PRN

## 2012-09-26 NOTE — Telephone Encounter (Signed)
Although Kenneth [name] had been taking and tolerating Motrin recently, it appears he has developed an allergic reaction to [name]. He also is listed to be allergic to Flexeril, hydrocodone and tramadol. I will discontinue [name] and try him on [name].

## 2012-09-26 NOTE — Telephone Encounter (Signed)
Error

## 2012-09-26 NOTE — Telephone Encounter (Signed)
Because of potential interaction of Tylenol #3 with the pain medications he has allergies or adverse reactions to on his allergy list I will encourage him to take only extra strength Tylenol. I have reviewed his records from [place] in [place] and notes indicate that he has had problems with substance abuse and drug seeking behaviors. His problem list from there states repeatedly that he is not a candidate for narcotic medications.

## 2012-09-26 NOTE — Telephone Encounter (Signed)
Patient called advised he had stopped by clinic this morning due to an allergic reaction he had to Naproxen. He advised per his conversation with RN she would ask provider to prescribe different medication. Patient advised he does not feel any better and is having pain in his neck. Advised the patient that I will forward Dr Orvan Falconer, who is in clinic at this time, a message and get back to him as soon as I get a response. Reviewed the patient allergy list, as it is extensive and verified his contact information and ended the call. Patient has a follow up visit with Dr Orvan Falconer 10/02/12

## 2012-09-26 NOTE — Addendum Note (Signed)
Addended by: Cliffton Asters on: 09/26/2012 05:05 PM   Modules accepted: Orders

## 2012-09-26 NOTE — Progress Notes (Signed)
Patient ID: Kenneth [name], male   DOB: April 09, 1956, 57 y.o.   MRN: [id number] Took first dose of OTC [name] last evening.  Within 10 minutes the pt had intense itching on his upper arms and axilla bilaterally.  Pt took Benadryl OTC x 2 and received relief from the itching.  Pt arrived here today because of intense neck and upper back pain (10 out of 10).  Having muscle spasms in his neck too.  RN to review neck xrays from yesterday for possible note from Dr. Orvan Falconer.  Pt also requesting assistance with obtaining PCP.

## 2012-09-26 NOTE — Telephone Encounter (Signed)
Dr. Orvan Falconer has placed another note in EPIC.  Pt is to try Extra Strength Tylenol due to many allergic reactions and review of records from [facility] physician notes found in "Care Everywhere."

## 2012-09-26 NOTE — Patient Instructions (Signed)
Patient advised to rest on his back with slight elevation of his head.  Pt has previously used a heat pack which provided some relief.  Advised to continue with heat packs.  Pt encouraged to keep follow-up appointment with Dr. Orvan Falconer next week.  RN will work on locating a PCP for the pt.

## 2012-09-27 ENCOUNTER — Encounter (HOSPITAL_COMMUNITY): Payer: Self-pay | Admitting: Emergency Medicine

## 2012-09-27 ENCOUNTER — Telehealth: Payer: Self-pay | Admitting: *Deleted

## 2012-09-27 ENCOUNTER — Emergency Department (HOSPITAL_COMMUNITY): Payer: Medicare Other

## 2012-09-27 ENCOUNTER — Emergency Department (HOSPITAL_COMMUNITY)
Admission: EM | Admit: 2012-09-27 | Discharge: 2012-09-27 | Disposition: A | Discharge status: Home / Self Care | Payer: Medicare Other | Attending: Emergency Medicine | Admitting: Emergency Medicine

## 2012-09-27 DIAGNOSIS — I10 Essential (primary) hypertension: Secondary | ICD-10-CM | POA: Insufficient documentation

## 2012-09-27 DIAGNOSIS — R0989 Other specified symptoms and signs involving the circulatory and respiratory systems: Secondary | ICD-10-CM | POA: Insufficient documentation

## 2012-09-27 DIAGNOSIS — B20 Human immunodeficiency virus [HIV] disease: Secondary | ICD-10-CM | POA: Insufficient documentation

## 2012-09-27 DIAGNOSIS — J42 Unspecified chronic bronchitis: Secondary | ICD-10-CM | POA: Insufficient documentation

## 2012-09-27 DIAGNOSIS — M19019 Primary osteoarthritis, unspecified shoulder: Secondary | ICD-10-CM | POA: Insufficient documentation

## 2012-09-27 DIAGNOSIS — Z8661 Personal history of infections of the central nervous system: Secondary | ICD-10-CM | POA: Insufficient documentation

## 2012-09-27 DIAGNOSIS — J438 Other emphysema: Secondary | ICD-10-CM | POA: Insufficient documentation

## 2012-09-27 DIAGNOSIS — Z87891 Personal history of nicotine dependence: Secondary | ICD-10-CM | POA: Insufficient documentation

## 2012-09-27 DIAGNOSIS — R0609 Other forms of dyspnea: Secondary | ICD-10-CM | POA: Insufficient documentation

## 2012-09-27 DIAGNOSIS — M545 Low back pain, unspecified: Secondary | ICD-10-CM | POA: Insufficient documentation

## 2012-09-27 DIAGNOSIS — G8929 Other chronic pain: Secondary | ICD-10-CM | POA: Insufficient documentation

## 2012-09-27 DIAGNOSIS — J439 Emphysema, unspecified: Secondary | ICD-10-CM

## 2012-09-27 DIAGNOSIS — Z981 Arthrodesis status: Secondary | ICD-10-CM | POA: Insufficient documentation

## 2012-09-27 DIAGNOSIS — R0789 Other chest pain: Secondary | ICD-10-CM | POA: Insufficient documentation

## 2012-09-27 DIAGNOSIS — G43009 Migraine without aura, not intractable, without status migrainosus: Secondary | ICD-10-CM | POA: Insufficient documentation

## 2012-09-27 DIAGNOSIS — I2510 Atherosclerotic heart disease of native coronary artery without angina pectoris: Secondary | ICD-10-CM | POA: Insufficient documentation

## 2012-09-27 DIAGNOSIS — Z79899 Other long term (current) drug therapy: Secondary | ICD-10-CM | POA: Insufficient documentation

## 2012-09-27 HISTORY — DX: Emphysema, unspecified: J43.9

## 2012-09-27 LAB — POCT I-STAT TROPONIN I: Troponin i, poc: 0 ng/mL (ref 0.00–0.08)

## 2012-09-27 LAB — CBC
HCT: 45.1 % (ref 39.0–52.0)
Hemoglobin: 15.3 g/dL (ref 13.0–17.0)
MCH: 28.3 pg (ref 26.0–34.0)
MCHC: 33.9 g/dL (ref 30.0–36.0)
MCV: 83.5 fL (ref 78.0–100.0)
Platelets: 285 10*3/uL (ref 150–400)
RBC: 5.4 MIL/uL (ref 4.22–5.81)
RDW: 15.6 % — ABNORMAL HIGH (ref 11.5–15.5)
WBC: 7.1 10*3/uL (ref 4.0–10.5)

## 2012-09-27 LAB — PRO B NATRIURETIC PEPTIDE: Pro B Natriuretic peptide (BNP): <5 pg/mL (ref 0–125)

## 2012-09-27 LAB — URINALYSIS, ROUTINE W REFLEX MICROSCOPIC
Bilirubin Urine: NEGATIVE
Glucose, UA: NEGATIVE mg/dL
Hgb urine dipstick: NEGATIVE
Ketones, ur: NEGATIVE mg/dL
Leukocytes, UA: NEGATIVE
Nitrite: NEGATIVE
Protein, ur: NEGATIVE mg/dL
Specific Gravity, Urine: 1.042 — ABNORMAL HIGH (ref 1.005–1.030)
Urobilinogen, UA: 1 mg/dL (ref 0.0–1.0)
pH: 6 (ref 5.0–8.0)

## 2012-09-27 LAB — POCT I-STAT, CHEM 8
BUN: 27 mg/dL — ABNORMAL HIGH (ref 6–23)
Calcium, Ion: 1.26 mmol/L — ABNORMAL HIGH (ref 1.12–1.23)
Chloride: 105 mEq/L (ref 96–112)
Creatinine, Ser: 1 mg/dL (ref 0.50–1.35)
Glucose, Bld: 100 mg/dL — ABNORMAL HIGH (ref 70–99)
HCT: 49 % (ref 39.0–52.0)
Hemoglobin: 16.7 g/dL (ref 13.0–17.0)
Potassium: 4.3 mEq/L (ref 3.5–5.1)
Sodium: 138 mEq/L (ref 135–145)
TCO2: 26 mmol/L (ref 0–100)

## 2012-09-27 LAB — TROPONIN I: Troponin I: <0.3 ng/mL (ref <0.30)

## 2012-09-27 MED ORDER — SODIUM CHLORIDE 0.9 % IV SOLN
1000.0000 mL | INTRAVENOUS | Status: DC
Start: 2012-09-27 — End: 2012-09-27
  Administered 2012-09-27: 1000 mL via INTRAVENOUS

## 2012-09-27 MED ORDER — ONDANSETRON HCL 4 MG/2ML IJ SOLN
4.0000 mg | Freq: Once | INTRAMUSCULAR | Status: AC
  Administered 2012-09-27: 4 mg via INTRAVENOUS
  Filled 2012-09-27: qty 2

## 2012-09-27 MED ORDER — SODIUM CHLORIDE 0.9 % IV SOLN
1000.0000 mL | Freq: Once | INTRAVENOUS | Status: AC
  Administered 2012-09-27: 1000 mL via INTRAVENOUS

## 2012-09-27 MED ORDER — HYDROMORPHONE HCL PF 1 MG/ML IJ SOLN
1.0000 mg | Freq: Once | INTRAMUSCULAR | Status: AC
  Administered 2012-09-27: 1 mg via INTRAVENOUS
  Filled 2012-09-27: qty 1

## 2012-09-27 MED ORDER — IOHEXOL 350 MG/ML SOLN
100.0000 mL | Freq: Once | INTRAVENOUS | Status: AC | PRN
  Administered 2012-09-27: 100 mL via INTRAVENOUS

## 2012-09-27 MED ORDER — ASPIRIN 81 MG PO CHEW
324.0000 mg | CHEWABLE_TABLET | Freq: Once | ORAL | Status: AC
  Administered 2012-09-27: 324 mg via ORAL
  Filled 2012-09-27: qty 4

## 2012-09-27 MED ORDER — OXYCODONE-ACETAMINOPHEN 5-325 MG PO TABS: 1.0000 | ORAL_TABLET | Freq: Four times a day (QID) | ORAL | Status: DC | PRN

## 2012-09-27 MED ORDER — HYDROMORPHONE HCL PF 1 MG/ML IJ SOLN
1.0000 mg | Freq: Once | INTRAMUSCULAR | Status: AC
Start: 2012-09-27 — End: 2012-09-27
  Administered 2012-09-27: 1 mg via INTRAVENOUS
  Filled 2012-09-27: qty 1

## 2012-09-27 NOTE — ED Provider Notes (Signed)
History     CSN: [id number]  Arrival date & time 09/27/12  1059   First MD Initiated Contact with Patient 09/27/12 1132      Chief Complaint  Patient presents with  . Back Pain  . Chest Pain    (Consider location/radiation/quality/duration/timing/severity/associated sxs/prior treatment) HPI Comments: Kenneth [name] is a 57 y.o. male with a history of HIV, CAD, chronic lower back pain and recurrent non-septic meningitis presents to the emergency department complaining of left-sided chest pain and back pain.  Note that patient was admitted to the hospital from 1/6 through 1/8 at which time he was treated for community-acquired pneumonia with Levaquin.  Patient was discharged with this antibiotic which he states he has been compliant with.  Onset of chest pain began this morning around 415 a.m., is located in the left sternal area and under her axilla, described as sharp and pleuritic in nature, and rated at 8/10 in severity.  In addition patient has had gradually worsening shortness of breath. Patient denies associated symptoms including leg swelling, cough, hemoptysis, PND, orthopnea, fever, night sweats, chills, abdominal pain, trauma, abdominal pain, or urinary s/s.   The history is provided by the patient.    Past Medical History  Diagnosis Date  . HIV positive 2004  . Hypertension   . Meningitis   . Coronary artery disease   . Chest pain at rest 09/23/2012  . Chronic bronchitis     "q year last 5 yr or so" (09/24/2012)  . Exertional dyspnea   . Migraines   . Arthritis     "both shoulders" (09/24/2012)  . Chronic lower back pain     Past Surgical History  Procedure Date  . Intussusception repair 10/2011  . Posterior lumbar fusion 1995  . Elbow surgery ~ 1997    "removed some stones; right" (09/24/2012)  . Appendectomy 2013    No family history on file.  History  Substance Use Topics  . Smoking status: Former Smoker -- 0.5 packs/day for 45 years    Types: Cigarettes    Quit  date: 12/18/2011  . Smokeless tobacco: Never Used     Comment: 09/24/2012 "been stopped now ~ 8 month"  . Alcohol Use: No      Review of Systems  Constitutional: Positive for activity change. Negative for fever, chills, diaphoresis, fatigue and unexpected weight change.  HENT: Negative for congestion, neck pain and neck stiffness.   Eyes: Negative for visual disturbance.  Respiratory: Positive for shortness of breath. Negative for apnea, cough, chest tightness, wheezing and stridor.   Cardiovascular: Positive for chest pain. Negative for palpitations and leg swelling.  Gastrointestinal: Negative for nausea, vomiting, abdominal pain, diarrhea and blood in stool.  Genitourinary: Negative for dysuria, urgency, hematuria and flank pain.  Musculoskeletal: Positive for back pain. Negative for myalgias and gait problem.  Skin: Negative for color change, pallor and wound.  Neurological: Negative for dizziness, syncope, light-headedness and headaches.  All other systems reviewed and are negative.    Allergies  Cyclobenzaprine; Flexeril; Hydrocodone; Nabumetone; [name]; Tramadol; Bee venom; and Other  Home Medications   Current Outpatient Rx  Name  Route  Sig  Dispense  Refill  . ACETAMINOPHEN 500 MG PO TABS   Oral   Take 2 tablets (1,000 mg total) by mouth every 6 (six) hours as needed for pain.   30 tablet   0   . IPRATROPIUM-ALBUTEROL 18-103 MCG/ACT IN AERO   Inhalation   Inhale 2 puffs into the lungs every 6 (  six) hours as needed. For shortness of breath         . DIAZEPAM 10 MG PO TABS   Oral   Take 10 mg by mouth every 6 (six) hours as needed. For muscle spasm.         Marland Kitchen FAMOTIDINE 10 MG PO TABS   Oral   Take 10 mg by mouth daily as needed. For heartburn.         . ADULT MULTIVITAMIN W/MINERALS CH   Oral   Take 1 tablet by mouth daily.         Marland Kitchen RAMIPRIL 2.5 MG PO CAPS   Oral   Take 1 capsule (2.5 mg total) by mouth 2 (two) times daily.   60 capsule   0     . SENNOSIDES-DOCUSATE SODIUM 8.6-50 MG PO TABS   Oral   Take 1 tablet by mouth daily as needed. For constipation.         . ACETAMINOPHEN-CODEINE #3 300-30 MG PO TABS   Oral   Take 1 tablet by mouth every 4 (four) hours as needed for pain.   30 tablet   0     BP 112/79  Pulse 63  Temp 98.4 F (36.9 C) (Oral)  Resp 16  SpO2 92%  Physical Exam  Nursing note and vitals reviewed. Constitutional: He is oriented to person, place, and time. He appears well-developed and well-nourished. He appears distressed.       tearful  HENT:  Head: Normocephalic and atraumatic.  Eyes: Conjunctivae normal and EOM are normal.  Neck: Normal range of motion.       No JVD  Cardiovascular:       RRR, no aberrancy on auscultation, intact distal pulses  Pulmonary/Chest: Effort normal.       LCAB, normal effort. Reproducible chest wall left sided tenderness extending to under axilla  Abdominal:       Soft non-tender, no pulsatile aorta  Musculoskeletal: Normal range of motion.  Neurological: He is alert and oriented to person, place, and time.  Skin: Skin is warm and dry. No rash noted. He is not diaphoretic.  Psychiatric: He has a normal mood and affect. His behavior is normal.    ED Course  Procedures (including critical care time)  Labs Reviewed  CBC - Abnormal; Notable for the following:    RDW 15.6 (*)     All other components within normal limits  URINALYSIS, ROUTINE W REFLEX MICROSCOPIC - Abnormal; Notable for the following:    Specific Gravity, Urine 1.042 (*)     All other components within normal limits  POCT I-STAT, CHEM 8 - Abnormal; Notable for the following:    BUN 27 (*)     Glucose, Bld 100 (*)     Calcium, Ion 1.26 (*)     All other components within normal limits  PRO B NATRIURETIC PEPTIDE  POCT I-STAT TROPONIN I   Dg Cervical Spine 2 Or 3 Views  09/25/2012  *RADIOLOGY REPORT*  Clinical Data: Chronic posterior neck pain  CERVICAL SPINE - 2-3 VIEW  Comparison: None.   Findings: Normal cervical spine alignment and prevertebral soft tissues.  Mild to moderate diffuse cervical degenerative changes and spondylosis from C3-C7.  Preserved vertebral body heights.  No definite fracture, compression deformity, or focal kyphosis. Odontoid appears intact.  The lung apices are clear.  Trachea is midline.  IMPRESSION: Mild to moderate cervical spondylosis.  No acute finding by plain radiography.   Original Report Authenticated By:  M. Miles Costain, M.D.    Ct Angio Chest Pe W/cm &/or Wo Cm  09/27/2012  *RADIOLOGY REPORT*  Clinical Data: Acute onset of left-sided chest pain and shortness of breath.  CT ANGIOGRAPHY CHEST  Technique:  Multidetector CT imaging of the chest using the standard protocol during bolus administration of intravenous contrast. Multiplanar reconstructed images including MIPs were obtained and reviewed to evaluate the vascular anatomy.  Contrast: OMNIPAQUE IOHEXOL 350 MG/ML SOLN  Comparison: None.  Findings: Contrast opacification of the pulmonary arteries is very good. No filling defects within either main pulmonary artery or their branches in either lung to suggest pulmonary embolism.  Heart size upper normal to mildly enlarged.  No pericardial effusion.  No visible coronary atherosclerosis.  Mild atherosclerosis involving the thoracic and upper abdominal aorta without aneurysm or dissection.  Severe bullous emphysematous changes throughout both lungs with vascular redistribution to the mid and lower lungs.  Linear scarring and/or atelectasis in the lower lobes bilaterally, right greater than left.  Early changes of interstitial fibrosis in the deep posterior lower lobes.  No confluent airspace consolidation. No pleural effusions.  Scattered normal-sized mediastinal, hilar, and axillary lymph nodes; no significant lymphadenopathy.  Visualized thyroid gland unremarkable.  Visualized upper abdomen unremarkable.  Bone window images demonstrate minimal thoracic spondylosis.   IMPRESSION:  1.  No evidence of pulmonary embolism. 2.  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  Linear atelectasis or scarring in the lower lobes, right greater than left.  No acute cardiopulmonary disease otherwise.   Original Report Authenticated By: Hulan Saas, M.D.    Dg Chest Portable 1 View  09/27/2012  *RADIOLOGY REPORT*  Clinical Data: Chest and back pain.  Shortness of breath.  PORTABLE CHEST - 1 VIEW  Comparison: Chest CT from 09/27/2012  Findings: Severe emphysema noted.  Primarily linear airspace opacities noted in the lung bases.  4 mm pulmonary nodule in the left upper lobe observed. Cardiac and mediastinal contours appear unremarkable.  IMPRESSION:  1.  Bibasilar scarring versus atelectasis. 2.  4 mm left upper lobe nodule. If the patient is at high risk for bronchogenic carcinoma, follow-up chest CT at 1 year is recommended.  If the patient is at low risk, no follow-up is needed.  This recommendation follows the consensus statement: Guidelines for Management of Small Pulmonary Nodules Detected on CT Scans:  A Statement from the Fleischner Society as published in Radiology 2005; 237:395-400.   Original Report Authenticated By: Gaylyn Rong, M.D.     Date: 09/27/2012  Rate: 102  Rhythm: sinus tachycardia  QRS Axis: left  Intervals: normal  ST/T Wave abnormalities: normal  Conduction Disutrbances:none  Narrative Interpretation:   Old EKG Reviewed: changes noted, non concerning for ischemia    No diagnosis found.  BP 112/79  Pulse 63  Temp 98.4 F (36.9 C) (Oral)  Resp 16  SpO2 92%   MDM  Chest pain   Patient is a 58 year old with a history of severe COPD, HIV, CAD, and chronic back pain that presents to the emergency department complaining of new onset chest pain.  Description of chest pain is low concern for CAD etiology as it is sharp and pleuritic in nature.  Patient had recent hospital stay and treatment for  pneumonia.  Possible etiology of pain is pleurisy versus pulmonary embolism.  Will review trop x2,  EKG in order x-ray, CT angio, and labs to plan disposition. Note Pt has just moved from [place] and is currently establishing relationships  with PCP, cardiologist and pulmonologist per the assistance of his ID physician.   Pt w VSS, no tracheal deviation, no JVD or new murmur, RRR, breath sounds equal bilaterally, EKG without acute abnormalities, negative troponin x2, no acute infiltrate on CXR, no PE on CTA. Patient is to be discharged with recommendation to follow up with PCP in regards to today's hospital visit.  Pt has been advised to return to the ED if CP becomes exertional, associated with diaphoresis or nausea, radiates to left jaw/arm, worsens or becomes concerning in any way. Pt appears reliable for follow up and is agreeable to discharge.   Case has been discussed with and seen by Dr. Blinda Leatherwood who agrees with the above plan to discharge.             Jaci Carrel, [place] 09/27/12 1732

## 2012-09-27 NOTE — Telephone Encounter (Signed)
Patient has a new patient appointment for 10/14/2012 at 1:45pm with Internal Medicine [facility].

## 2012-09-27 NOTE — Telephone Encounter (Signed)
Patient called requesting something stronger for his neck pain, stating that "the pharmacy won't give me the Tylenol 3 the doctor sent in." Upon chart review and speaking with the doctor's RN, patient was aware that will not be given anything stronger than extra strength tylenol for pain.  Further review of Care Everywhere notes from [facility] it states that he is "not a candidate for narcotics d/t high opiates score."  Patient made aware that he has an appointment with Dr. Orvan Falconer on 10/02/12 to discuss pain management options. Patient no longer satisfied with this plan, threatened that he would go to the emergency room to get something stronger. RN agreed with this plan. Andree Coss, RN

## 2012-09-27 NOTE — ED Notes (Signed)
Pt presenting to ed with c/o back pain and left side chest pain onset since yesterday that now radiates into his left arm. Pt states he was seen at [facility] yesterday for the same. Pt states history of chronic back pain

## 2012-09-27 NOTE — ED Notes (Signed)
Patient transported to CT 

## 2012-09-30 ENCOUNTER — Telehealth: Payer: Self-pay | Admitting: *Deleted

## 2012-09-30 LAB — CULTURE, BLOOD (ROUTINE X 2)
Culture: NO GROWTH
Culture: NO GROWTH

## 2012-09-30 NOTE — Progress Notes (Signed)
Pt has PCP appt at [facility]

## 2012-09-30 NOTE — Telephone Encounter (Signed)
Patient advised he has had a reaction to Tylenol 500 mg. The reaction he described is a rash on neck and chest and itching around eyes and ears. Advised him to stop taking the medication. He advised he has and went to the ED on Friday and they gave him benadryl and it stopped the itch. I looked in the chart and saw that they gave him Oxy/tylenol 5/325 and advised him to stop taking that also because it contains Tylenol. Patient advised he will and he just deal with the pain until he sees Dr Orvan Falconer on Wednesday 10/02/12.

## 2012-10-01 NOTE — ED Provider Notes (Signed)
Medical screening examination/treatment/procedure(s) were conducted as a shared visit with non-physician practitioner(s) and myself.  I personally evaluated the patient during the encounter.  PAtient worked up in the ER and was stable for discharge. Pain due to his COPD, outpatient treamtent and follow up.  Gilda Crease, MD 10/01/12 [phone]

## 2012-10-02 ENCOUNTER — Encounter: Payer: Self-pay | Admitting: Internal Medicine

## 2012-10-02 ENCOUNTER — Ambulatory Visit (INDEPENDENT_AMBULATORY_CARE_PROVIDER_SITE_OTHER): Payer: Medicare Other | Admitting: Internal Medicine

## 2012-10-02 VITALS — BP 110/77 | HR 94 | Temp 97.8°F | Ht 69.0 in | Wt 157.2 lb

## 2012-10-02 DIAGNOSIS — M545 Low back pain, unspecified: Secondary | ICD-10-CM

## 2012-10-02 DIAGNOSIS — M542 Cervicalgia: Secondary | ICD-10-CM

## 2012-10-02 DIAGNOSIS — Z21 Asymptomatic human immunodeficiency virus [HIV] infection status: Secondary | ICD-10-CM

## 2012-10-02 DIAGNOSIS — G8929 Other chronic pain: Secondary | ICD-10-CM

## 2012-10-02 NOTE — Progress Notes (Signed)
Patient ID: Kenneth [name], male   DOB: December 06, 1955, 57 y.o.   MRN: [id number]     Kenneth [name] for Infectious Disease  Patient Active Problem List  Diagnosis  . Benign recurrent aseptic meningitis  . HIV positive  . HTN (hypertension)  . Asthma  . CAP (community acquired pneumonia)  . Coronary artery disease  . Chronic lower back pain  . Chronic midline posterior neck pain  . COPD (chronic obstructive pulmonary disease) with emphysema    Patient's Medications  New Prescriptions   No medications on file  Previous Medications   ACETAMINOPHEN (TYLENOL) 500 MG TABLET    Take 2 tablets (1,000 mg total) by mouth every 6 (six) hours as needed for pain.   ALBUTEROL-IPRATROPIUM (COMBIVENT) 18-103 MCG/ACT INHALER    Inhale 2 puffs into the lungs every 6 (six) hours as needed. For shortness of breath   DIAZEPAM (VALIUM) 10 MG TABLET    Take 10 mg by mouth every 6 (six) hours as needed. For muscle spasm.   FAMOTIDINE (PEPCID) 10 MG TABLET    Take 10 mg by mouth daily as needed. For heartburn.   MULTIPLE VITAMIN (MULITIVITAMIN WITH MINERALS) TABS    Take 1 tablet by mouth daily.   OXYCODONE-ACETAMINOPHEN (PERCOCET/ROXICET) 5-325 MG PER TABLET    Take 1 tablet by mouth every 6 (six) hours as needed for pain.   RAMIPRIL (ALTACE) 2.5 MG CAPSULE    Take 1 capsule (2.5 mg total) by mouth 2 (two) times daily.   SENNA-DOCUSATE (SENOKOT-S) 8.6-50 MG PER TABLET    Take 1 tablet by mouth daily as needed. For constipation.  Modified Medications   No medications on file  Discontinued Medications   ACETAMINOPHEN-CODEINE (TYLENOL #3) 300-30 MG PER TABLET    Take 1 tablet by mouth every 4 (four) hours as needed for pain.    Subjective: He is in for his routine visit. He was recently rehospitalized and was felt to have had possible community-acquired pneumonia. He was treated with antibiotics but states that he still has some productive cough. He has not had any fever or shortness of breath.  I have reviewed  his records and anaerobic from Kenneth [name]. They're numerous references to polysubstance abuse and drug seeking behavior and the problem list notes that he was felt to not be a candidate for opiate therapy. He had a urine drug screen in October that was positive for benzodiazepines and cocaine. He does admit to a prior history of polysubstance abuse and chronic pain syndrome. He has not been under the care of a pain clinic.  Objective: Temp: 97.8 F (36.6 C) (01/15 1402) Temp src: Oral (01/15 1402) BP: 110/77 mmHg (01/15 1402) Pulse Rate: 94  (01/15 1402)  General: He appears uncomfortable from pain and has a lower abdominal Velcro brace on Skin: No rash Neck: Supple Lungs: Clear Cor: Regular S1 and S2 with no murmurs Abdomen: Soft with healed lower midline incision. He has subjective discomfort with palpation over the lower abdomen but no masses are felt.  Lab Results HIV 1 RNA Quant (copies/mL)  Date Value  09/23/2012 184*  08/26/2012 73*     CD4 T Cell Abs (cmm)  Date Value  09/23/2012 600   08/26/2012 760      Assessment: He has asymptomatic HIV infection with a low viral load and has never been on antiretroviral therapy. I talked to him about current guidelines to suggest everyone with HIV should be treated regardless of CD4 or viral load.  I think it is best to continue observation off of antiretroviral therapy until we can establish primary care and help him develop a good long term plan for chronic pain management.  Plan: 1. Observe off of antiretroviral therapy for now 2. Encouraged abstinence from Street drugs 3. He has a primary care appointment with Kenneth [name] on January 27 4. Followup here in early March   Kenneth Asters, MD Kenneth [name] for Infectious Disease [name] [phone] pager   [phone] cell 10/02/2012, 2:26 PM

## 2012-10-04 ENCOUNTER — Encounter (HOSPITAL_COMMUNITY): Payer: Self-pay | Admitting: *Deleted

## 2012-10-04 ENCOUNTER — Emergency Department (HOSPITAL_COMMUNITY)
Admission: EM | Admit: 2012-10-04 | Discharge: 2012-10-04 | Disposition: A | Discharge status: Home / Self Care | Payer: Medicare Other | Attending: Emergency Medicine | Admitting: Emergency Medicine

## 2012-10-04 DIAGNOSIS — Z8661 Personal history of infections of the central nervous system: Secondary | ICD-10-CM | POA: Insufficient documentation

## 2012-10-04 DIAGNOSIS — Z21 Asymptomatic human immunodeficiency virus [HIV] infection status: Secondary | ICD-10-CM | POA: Insufficient documentation

## 2012-10-04 DIAGNOSIS — G8929 Other chronic pain: Secondary | ICD-10-CM | POA: Insufficient documentation

## 2012-10-04 DIAGNOSIS — Z87891 Personal history of nicotine dependence: Secondary | ICD-10-CM | POA: Insufficient documentation

## 2012-10-04 DIAGNOSIS — M129 Arthropathy, unspecified: Secondary | ICD-10-CM | POA: Insufficient documentation

## 2012-10-04 DIAGNOSIS — I2510 Atherosclerotic heart disease of native coronary artery without angina pectoris: Secondary | ICD-10-CM | POA: Insufficient documentation

## 2012-10-04 DIAGNOSIS — I10 Essential (primary) hypertension: Secondary | ICD-10-CM | POA: Insufficient documentation

## 2012-10-04 DIAGNOSIS — M545 Low back pain, unspecified: Secondary | ICD-10-CM | POA: Insufficient documentation

## 2012-10-04 DIAGNOSIS — Z79899 Other long term (current) drug therapy: Secondary | ICD-10-CM | POA: Insufficient documentation

## 2012-10-04 DIAGNOSIS — R51 Headache: Secondary | ICD-10-CM | POA: Insufficient documentation

## 2012-10-04 DIAGNOSIS — M542 Cervicalgia: Secondary | ICD-10-CM | POA: Insufficient documentation

## 2012-10-04 DIAGNOSIS — J42 Unspecified chronic bronchitis: Secondary | ICD-10-CM | POA: Insufficient documentation

## 2012-10-04 DIAGNOSIS — Z8679 Personal history of other diseases of the circulatory system: Secondary | ICD-10-CM | POA: Insufficient documentation

## 2012-10-04 DIAGNOSIS — Z8669 Personal history of other diseases of the nervous system and sense organs: Secondary | ICD-10-CM | POA: Insufficient documentation

## 2012-10-04 MED ORDER — DIAZEPAM 5 MG PO TABS: 5.0000 mg | ORAL_TABLET | Freq: Four times a day (QID) | ORAL | Status: DC | PRN

## 2012-10-04 NOTE — ED Provider Notes (Addendum)
History     CSN: [id number]  Arrival date & time 10/04/12  1131   First MD Initiated Contact with Patient 10/04/12 1229      Chief Complaint  Patient presents with  . Headache  . Neck Pain    (Consider location/radiation/quality/duration/timing/severity/associated sxs/prior treatment) Patient is a 57 y.o. male presenting with headaches and neck pain. The history is provided by the patient.  Headache   Neck Pain  Associated symptoms include headaches.   patient here complaining of chronic headache neck pain since he had meningitis back in 2012. Was seen in his doctor's office 2 days ago for similar complaints and not prescribe medications do to his history of street drug use as well as a positive drug screen for cocaine. His current pain is similar to his For the past. No high fevers noted. No severe photophobia. No vomiting. Patient states that this does not feel like his meningitis and feels like his chronic pain. Denies any new injuries. Pain starts at his upper trapezius and radiates to the lateral portion of the spine and is worse with movement. Has used Tylenol without relief.  Past Medical History  Diagnosis Date  . HIV positive 2004  . Hypertension   . Meningitis   . Coronary artery disease   . Chest pain at rest 09/23/2012  . Chronic bronchitis     "q year last 5 yr or so" (09/24/2012)  . Exertional dyspnea   . Migraines   . Arthritis     "both shoulders" (09/24/2012)  . Chronic lower back pain     Past Surgical History  Procedure Date  . Intussusception repair 10/2011  . Posterior lumbar fusion 1995  . Elbow surgery ~ 1997    "removed some stones; right" (09/24/2012)  . Appendectomy 2013    No family history on file.  History  Substance Use Topics  . Smoking status: Former Smoker -- 0.5 packs/day for 45 years    Types: Cigarettes    Quit date: 12/18/2011  . Smokeless tobacco: Never Used     Comment: 09/24/2012 "been stopped now ~ 8 month"  . Alcohol Use: No       Review of Systems  HENT: Positive for neck pain.   Neurological: Positive for headaches.  All other systems reviewed and are negative.    Allergies  Cyclobenzaprine; Flexeril; Hydrocodone; Nabumetone; [name]; Tramadol; Bee venom; and Other  Home Medications   Current Outpatient Rx  Name  Route  Sig  Dispense  Refill  . ACETAMINOPHEN 500 MG PO TABS   Oral   Take 2 tablets (1,000 mg total) by mouth every 6 (six) hours as needed for pain.   30 tablet   0   . IPRATROPIUM-ALBUTEROL 18-103 MCG/ACT IN AERO   Inhalation   Inhale 2 puffs into the lungs every 6 (six) hours as needed. For shortness of breath         . DIAZEPAM 10 MG PO TABS   Oral   Take 10 mg by mouth every 6 (six) hours as needed. For muscle spasm.         Marland Kitchen FAMOTIDINE 10 MG PO TABS   Oral   Take 10 mg by mouth daily as needed. For heartburn.         . ADULT MULTIVITAMIN W/MINERALS CH   Oral   Take 1 tablet by mouth daily.         Bernadette Hoit SODIUM 8.6-50 MG PO TABS   Oral   Take  1 tablet by mouth daily as needed. For constipation.           BP 144/81  Pulse 94  Temp 98.2 F (36.8 C) (Oral)  Resp 18  SpO2 99%  Physical Exam  Nursing note and vitals reviewed. Constitutional: He is oriented to person, place, and time. He appears well-developed and well-nourished.  Non-toxic appearance. No distress.  HENT:  Head: Normocephalic and atraumatic.  Eyes: Conjunctivae normal, EOM and lids are normal. Pupils are equal, round, and reactive to light.  Neck: Normal range of motion. Neck supple. Muscular tenderness present. No spinous process tenderness present. No rigidity. No tracheal deviation present. No mass present.  Cardiovascular: Normal rate, regular rhythm and normal heart sounds.  Exam reveals no gallop.   No murmur heard. Pulmonary/Chest: Effort normal and breath sounds normal. No stridor. No respiratory distress. He has no decreased breath sounds. He has no wheezes. He  has no rhonchi. He has no rales.  Abdominal: Soft. Normal appearance and bowel sounds are normal. He exhibits no distension. There is no tenderness. There is no rebound and no CVA tenderness.  Musculoskeletal: Normal range of motion. He exhibits no edema and no tenderness.  Neurological: He is alert and oriented to person, place, and time. He has normal strength. No cranial nerve deficit or sensory deficit. GCS eye subscore is 4. GCS verbal subscore is 5. GCS motor subscore is 6.  Skin: Skin is warm and dry. No abrasion and no rash noted.  Psychiatric: He has a normal mood and affect. His speech is normal and behavior is normal.    ED Course  Procedures (including critical care time)  Labs Reviewed - No data to display No results found.   No diagnosis found.    MDM  Pt without signs of meningitis --he endores that his current sx are similar to his chronic pain--he is afebrile here and well appearing..he is able to ambulate without difficulty, has a negative kernigs sign--he has been referred to a chronic pain clinic and has an appointment scheduled--will rx muscle relaxants        Toy Baker, MD 10/04/12 1304  Toy Baker, MD 10/04/12 1304

## 2012-10-04 NOTE — ED Notes (Signed)
Hx of bacterial meningitis in 2012. States neck stiffness/pain, headache, nausea/vomiting/fever- off and on for approximately one month.

## 2012-10-11 ENCOUNTER — Telehealth: Payer: Self-pay | Admitting: Licensed Clinical Social Worker

## 2012-10-11 NOTE — Telephone Encounter (Signed)
Please let him know that since his CD4 count is normal and he does not have a documented history of recent unintentional weight loss I will not be able to order her Ensure.

## 2012-10-11 NOTE — Telephone Encounter (Signed)
Patient wants ensure, he states he forgot to ask for it at his last visit. He would like it sent to [facility] if possible. He complains of no appetite, and weight loss.

## 2012-10-11 NOTE — Telephone Encounter (Signed)
I called and the patient is aware.

## 2012-10-14 ENCOUNTER — Encounter: Payer: Self-pay | Admitting: Internal Medicine

## 2012-10-14 ENCOUNTER — Ambulatory Visit (INDEPENDENT_AMBULATORY_CARE_PROVIDER_SITE_OTHER): Payer: Medicare Other | Admitting: Internal Medicine

## 2012-10-14 VITALS — BP 135/86 | HR 94 | Temp 97.8°F | Ht 69.0 in | Wt 160.6 lb

## 2012-10-14 DIAGNOSIS — M545 Low back pain, unspecified: Secondary | ICD-10-CM

## 2012-10-14 DIAGNOSIS — I10 Essential (primary) hypertension: Secondary | ICD-10-CM

## 2012-10-14 DIAGNOSIS — G8929 Other chronic pain: Secondary | ICD-10-CM

## 2012-10-14 MED ORDER — KETOROLAC TROMETHAMINE 30 MG/ML IJ SOLN
30.0000 mg | Freq: Once | INTRAMUSCULAR | Status: AC
  Administered 2012-10-14: 30 mg via INTRAMUSCULAR

## 2012-10-14 MED ORDER — KETOROLAC TROMETHAMINE 30 MG/ML IM SOLN: 30.0000 mg | Freq: Once | INTRAMUSCULAR | Status: DC

## 2012-10-14 NOTE — Progress Notes (Signed)
Subjective:   Patient ID: Kenneth [name] male   DOB: 06-01-1956 57 y.o.   MRN: [id number]  HPI: Kenneth [name] is a 57 y.o. man pmh HIV referred from ID clinic for elevated BPs. Patient states that he is here for chronic pain management after her and at work injury that happened several years ago. Per extensive chart review it seems that the patient has been receiving narcotics from several pharmacies and physicians. Patient has a history of polysubstance abuse was significant for cocaine. Patient had been referred to [facility] neurosurgery and spine clinic about a year ago and he says that surgery was not recommended and referral to pain clinic was suggested at that time. Patient was not given pain contract or long term use of pain medication. Patient does not have any urinary or bowel incontinence, loss of sensation, perianal anesthesia, problem ambulating, nausea/vomiting, or headaches.    Past Medical History  Diagnosis Date  . HIV positive 2004  . Hypertension   . Meningitis   . Coronary artery disease   . Chest pain at rest 09/23/2012  . Chronic bronchitis     "q year last 5 yr or so" (09/24/2012)  . Exertional dyspnea   . Migraines   . Arthritis     "both shoulders" (09/24/2012)  . Chronic lower back pain    Current Outpatient Prescriptions  Medication Sig Dispense Refill  . acetaminophen (TYLENOL) 500 MG tablet Take 2 tablets (1,000 mg total) by mouth every 6 (six) hours as needed for pain.  30 tablet  0  . albuterol-ipratropium (COMBIVENT) 18-103 MCG/ACT inhaler Inhale 2 puffs into the lungs every 6 (six) hours as needed. For shortness of breath      . diazepam (VALIUM) 10 MG tablet Take 10 mg by mouth every 6 (six) hours as needed. For muscle spasm.      . diazepam (VALIUM) 5 MG tablet Take 1 tablet (5 mg total) by mouth every 6 (six) hours as needed (muscle strain).  30 tablet  0  . famotidine (PEPCID) 10 MG tablet Take 10 mg by mouth daily as needed. For heartburn.      . Multiple  Vitamin (MULITIVITAMIN WITH MINERALS) TABS Take 1 tablet by mouth daily.      Marland Kitchen senna-docusate (SENOKOT-S) 8.6-50 MG per tablet Take 1 tablet by mouth daily as needed. For constipation.       No family history on file. History   Social History  . Marital Status: Married    Spouse Name: N/A    Number of Children: N/A  . Years of Education: N/A   Social History Main Topics  . Smoking status: Former Smoker -- 0.5 packs/day for 45 years    Types: Cigarettes    Quit date: 12/18/2011  . Smokeless tobacco: Never Used     Comment: 09/24/2012 "been stopped now ~ 8 month"  . Alcohol Use: No  . Drug Use: Yes    Special: Cocaine     Comment: 09/24/2012 "last drug use was ~ 8 months ago"  . Sexually Active: No     Comment: declined condoms   Other Topics Concern  . None   Social History Narrative  . None   Review of Systems: otherwise negative unless listed in HPI  Objective:  Physical Exam: Filed Vitals:   10/14/12 1459  BP: 135/86  Pulse: 94  Temp: 97.8 F (36.6 C)  TempSrc: Oral  Height: 5\' 9"  (1.753 m)  Weight: 160 lb 9.6 oz (72.848 kg)  SpO2:  95%   General: slight distress HEENT: PERRL, EOMI, no scleral icterus Cardiac: RRR, no rubs, murmurs or gallops Pulm: clear to auscultation bilaterally, moving normal volumes of air Abd: soft, nontender, nondistended, BS present Ext: warm and well perfused, no pedal edema MS: limited ROM of neck and back 2/2 pain, normal sensation, normal ambulation Neuro: alert and oriented X3, cranial nerves II-XII grossly intact  Assessment & Plan:  1. chronic low back pain: Patient has a long-standing history of chronic low back pain status post a work injury back in 1995. Patient has extensive allergies to several pain medications and was once referred to [facility] neurosurgery and spine but did not recommend surgery be performed at this time. -Referral to pain clinic -Toradol shot -possible referral to neurosurgery if doesn't improve  2. HTN:  pt blood pressures are well controlled. -Continue metoprolol 12.5mg  twice a day -routine f/u in 3-6 months   Pt discussed with Dr. Criselda Peaches

## 2012-10-14 NOTE — Addendum Note (Signed)
Addended by: Hassan Buckler on: 10/14/2012 04:15 PM   Modules accepted: Orders

## 2012-10-15 ENCOUNTER — Telehealth: Payer: Self-pay | Admitting: Licensed Clinical Social Worker

## 2012-10-15 NOTE — Telephone Encounter (Signed)
Ok I called the patient, and he wants to know what does he need to do in the meantime while he waits to get in a pain clinic. I advised him that we do not manage chronic pain and he would need to talk to his pcp.

## 2012-10-15 NOTE — Telephone Encounter (Signed)
Patient went to Internal medicine yesterday to get established with a PCP for blood pressure control only. He asked them for pain medication and was denied, he called this morning wanting Dr. Orvan Falconer to reconsider prescribing him pain medication. He states he is in a lot of pain.

## 2012-10-15 NOTE — Telephone Encounter (Signed)
Please let Kenneth [name] I will not agree to prescribe narcotic pain medication for him.

## 2012-10-19 ENCOUNTER — Emergency Department (HOSPITAL_COMMUNITY): Payer: Medicare Other

## 2012-10-19 ENCOUNTER — Emergency Department (HOSPITAL_COMMUNITY)
Admission: EM | Admit: 2012-10-19 | Discharge: 2012-10-19 | Disposition: A | Discharge status: Home / Self Care | Payer: Medicare Other | Attending: Emergency Medicine | Admitting: Emergency Medicine

## 2012-10-19 ENCOUNTER — Encounter (HOSPITAL_COMMUNITY): Payer: Self-pay | Admitting: *Deleted

## 2012-10-19 DIAGNOSIS — Z8619 Personal history of other infectious and parasitic diseases: Secondary | ICD-10-CM | POA: Insufficient documentation

## 2012-10-19 DIAGNOSIS — R059 Cough, unspecified: Secondary | ICD-10-CM | POA: Insufficient documentation

## 2012-10-19 DIAGNOSIS — R05 Cough: Secondary | ICD-10-CM | POA: Insufficient documentation

## 2012-10-19 DIAGNOSIS — Z87891 Personal history of nicotine dependence: Secondary | ICD-10-CM | POA: Insufficient documentation

## 2012-10-19 DIAGNOSIS — Z8701 Personal history of pneumonia (recurrent): Secondary | ICD-10-CM | POA: Insufficient documentation

## 2012-10-19 DIAGNOSIS — I2510 Atherosclerotic heart disease of native coronary artery without angina pectoris: Secondary | ICD-10-CM | POA: Insufficient documentation

## 2012-10-19 DIAGNOSIS — Z8673 Personal history of transient ischemic attack (TIA), and cerebral infarction without residual deficits: Secondary | ICD-10-CM | POA: Insufficient documentation

## 2012-10-19 DIAGNOSIS — Z8739 Personal history of other diseases of the musculoskeletal system and connective tissue: Secondary | ICD-10-CM | POA: Insufficient documentation

## 2012-10-19 DIAGNOSIS — Z8679 Personal history of other diseases of the circulatory system: Secondary | ICD-10-CM | POA: Insufficient documentation

## 2012-10-19 DIAGNOSIS — R51 Headache: Secondary | ICD-10-CM | POA: Insufficient documentation

## 2012-10-19 DIAGNOSIS — IMO0001 Reserved for inherently not codable concepts without codable children: Secondary | ICD-10-CM | POA: Insufficient documentation

## 2012-10-19 DIAGNOSIS — J159 Unspecified bacterial pneumonia: Secondary | ICD-10-CM | POA: Insufficient documentation

## 2012-10-19 DIAGNOSIS — Z79899 Other long term (current) drug therapy: Secondary | ICD-10-CM | POA: Insufficient documentation

## 2012-10-19 DIAGNOSIS — I10 Essential (primary) hypertension: Secondary | ICD-10-CM | POA: Insufficient documentation

## 2012-10-19 DIAGNOSIS — Z8709 Personal history of other diseases of the respiratory system: Secondary | ICD-10-CM | POA: Insufficient documentation

## 2012-10-19 DIAGNOSIS — Z21 Asymptomatic human immunodeficiency virus [HIV] infection status: Secondary | ICD-10-CM | POA: Insufficient documentation

## 2012-10-19 DIAGNOSIS — J189 Pneumonia, unspecified organism: Secondary | ICD-10-CM

## 2012-10-19 HISTORY — DX: Pneumonia, unspecified organism: J18.9

## 2012-10-19 LAB — CBC WITH DIFFERENTIAL/PLATELET
Basophils Absolute: 0 10*3/uL (ref 0.0–0.1)
Basophils Relative: 0 % (ref 0–1)
Eosinophils Absolute: 0 10*3/uL (ref 0.0–0.7)
Eosinophils Relative: 0 % (ref 0–5)
HCT: 44.3 % (ref 39.0–52.0)
Hemoglobin: 14.5 g/dL (ref 13.0–17.0)
Lymphocytes Relative: 19 % (ref 12–46)
Lymphs Abs: 2.8 10*3/uL (ref 0.7–4.0)
MCH: 27.6 pg (ref 26.0–34.0)
MCHC: 32.7 g/dL (ref 30.0–36.0)
MCV: 84.2 fL (ref 78.0–100.0)
Monocytes Absolute: 1.3 10*3/uL — ABNORMAL HIGH (ref 0.1–1.0)
Monocytes Relative: 9 % (ref 3–12)
Neutro Abs: 10.3 10*3/uL — ABNORMAL HIGH (ref 1.7–7.7)
Neutrophils Relative %: 71 % (ref 43–77)
Platelets: 274 10*3/uL (ref 150–400)
RBC: 5.26 MIL/uL (ref 4.22–5.81)
RDW: 15.7 % — ABNORMAL HIGH (ref 11.5–15.5)
WBC: 14.4 10*3/uL — ABNORMAL HIGH (ref 4.0–10.5)

## 2012-10-19 LAB — COMPREHENSIVE METABOLIC PANEL
ALT: 9 U/L (ref 0–53)
AST: 14 U/L (ref 0–37)
Albumin: 3.4 g/dL — ABNORMAL LOW (ref 3.5–5.2)
Alkaline Phosphatase: 104 U/L (ref 39–117)
BUN: 15 mg/dL (ref 6–23)
CO2: 28 mEq/L (ref 19–32)
Calcium: 9.6 mg/dL (ref 8.4–10.5)
Chloride: 102 mEq/L (ref 96–112)
Creatinine, Ser: 0.96 mg/dL (ref 0.50–1.35)
GFR calc Af Amer: >90 mL/min (ref >90)
GFR calc non Af Amer: >90 mL/min (ref >90)
Glucose, Bld: 129 mg/dL — ABNORMAL HIGH (ref 70–99)
Potassium: 4 mEq/L (ref 3.5–5.1)
Sodium: 137 mEq/L (ref 135–145)
Total Bilirubin: 0.3 mg/dL (ref 0.3–1.2)
Total Protein: 8.8 g/dL — ABNORMAL HIGH (ref 6.0–8.3)

## 2012-10-19 MED ORDER — ALBUTEROL SULFATE (5 MG/ML) 0.5% IN NEBU
5.0000 mg | INHALATION_SOLUTION | Freq: Once | RESPIRATORY_TRACT | Status: AC
  Administered 2012-10-19: 5 mg via RESPIRATORY_TRACT
  Filled 2012-10-19: qty 40

## 2012-10-19 MED ORDER — AZITHROMYCIN 250 MG PO TABS: 250.0000 mg | ORAL_TABLET | Freq: Every day | ORAL | Status: DC

## 2012-10-19 MED ORDER — AZITHROMYCIN 250 MG PO TABS
500.0000 mg | ORAL_TABLET | Freq: Once | ORAL | Status: AC
  Administered 2012-10-19: 500 mg via ORAL
  Filled 2012-10-19: qty 2

## 2012-10-19 MED ORDER — OXYCODONE-ACETAMINOPHEN 5-325 MG PO TABS
1.0000 | ORAL_TABLET | Freq: Once | ORAL | Status: AC
  Administered 2012-10-19: 1 via ORAL
  Filled 2012-10-19: qty 1

## 2012-10-19 NOTE — ED Notes (Addendum)
C/o flu like sx, lists: fever, body ahces, HA, sob, hip pain, non-productive cough & weakness, (denies: nvd, dizziness, bleeding or congestion), onset Saturday at noon. Denies ETOH or drug use (admits to former). Last tylenol: 2 at 2000. States, "able to take tylenol/acetaminophen, but NOT able to take ibuprofen".

## 2012-10-19 NOTE — ED Provider Notes (Signed)
History     CSN: [id number]  Arrival date & time 10/19/12  0053   First MD Initiated Contact with Patient 10/19/12 0136      Chief Complaint  Patient presents with  . Influenza    (Consider location/radiation/quality/duration/timing/severity/associated sxs/prior treatment) HPI Pt with history of HIV and chronic pain reports 12 hours of flu-like symptoms at home including subjective fever, diffuse headache, cough and myalgias. Denies any vomiting or diarrhea. He has recently moved to this area and has had several recent ED and PCP visits for a variety of complaints. He last had CD4 count checked about 3 weeks ago and it was 600.   Past Medical History  Diagnosis Date  . HIV positive 2004  . Hypertension   . Meningitis   . Coronary artery disease   . Chest pain at rest 09/23/2012  . Chronic bronchitis     "q year last 5 yr or so" (09/24/2012)  . Exertional dyspnea   . Migraines   . Arthritis     "both shoulders" (09/24/2012)  . Chronic lower back pain   . Pneumonia     Past Surgical History  Procedure Date  . Intussusception repair 10/2011  . Posterior lumbar fusion 1995  . Elbow surgery ~ 1997    "removed some stones; right" (09/24/2012)  . Appendectomy 2013  . Back surgery   . Abdominal surgery     No family history on file.  History  Substance Use Topics  . Smoking status: Former Smoker -- 0.5 packs/day for 45 years    Types: Cigarettes    Quit date: 12/18/2011  . Smokeless tobacco: Never Used     Comment: 09/24/2012 "been stopped now ~ 8 month"  . Alcohol Use: No      Review of Systems All other systems reviewed and are negative except as noted in HPI.   Allergies  Cyclobenzaprine; Flexeril; Hydrocodone; Nabumetone; [name]; Tramadol; Bee venom; and Other  Home Medications   Current Outpatient Rx  Name  Route  Sig  Dispense  Refill  . ACETAMINOPHEN 500 MG PO TABS   Oral   Take 2 tablets (1,000 mg total) by mouth every 6 (six) hours as needed for pain.    30 tablet   0   . IPRATROPIUM-ALBUTEROL 18-103 MCG/ACT IN AERO   Inhalation   Inhale 2 puffs into the lungs every 6 (six) hours as needed. For shortness of breath         . DIAZEPAM 5 MG PO TABS   Oral   Take 1 tablet (5 mg total) by mouth every 6 (six) hours as needed (muscle strain).   30 tablet   0   . FAMOTIDINE 10 MG PO TABS   Oral   Take 10 mg by mouth daily as needed. For heartburn.         . ADULT MULTIVITAMIN W/MINERALS CH   Oral   Take 1 tablet by mouth daily.         Marland Kitchen RAMIPRIL 2.5 MG PO CAPS   Oral   Take 2.5 mg by mouth daily.         Bernadette Hoit SODIUM 8.6-50 MG PO TABS   Oral   Take 1 tablet by mouth daily as needed. For constipation.           BP 126/86  Pulse 108  Temp 99.5 F (37.5 C) (Oral)  Resp 20  SpO2 96%  Physical Exam  Nursing note and vitals reviewed. Constitutional: He is  oriented to person, place, and time. He appears well-developed and well-nourished.  HENT:  Head: Normocephalic and atraumatic.  Eyes: EOM are normal. Pupils are equal, round, and reactive to light.  Neck: Normal range of motion. Neck supple.  Cardiovascular: Normal rate, normal heart sounds and intact distal pulses.   Pulmonary/Chest: Effort normal and breath sounds normal.  Abdominal: Bowel sounds are normal. He exhibits no distension. There is no tenderness.  Musculoskeletal: Normal range of motion. He exhibits no edema and no tenderness.  Neurological: He is alert and oriented to person, place, and time. He has normal strength. No cranial nerve deficit or sensory deficit.  Skin: Skin is warm and dry. No rash noted.  Psychiatric: He has a normal mood and affect.    ED Course  Procedures (including critical care time)  Labs Reviewed  CBC WITH DIFFERENTIAL - Abnormal; Notable for the following:    WBC 14.4 (*)     RDW 15.7 (*)     Neutro Abs 10.3 (*)     Monocytes Absolute 1.3 (*)     All other components within normal limits   COMPREHENSIVE METABOLIC PANEL - Abnormal; Notable for the following:    Glucose, Bld 129 (*)     Total Protein 8.8 (*)     Albumin 3.4 (*)     All other components within normal limits   Dg Chest 2 View  10/19/2012  *RADIOLOGY REPORT*  Clinical Data: Fever, headache, cough and congestion.  CHEST - 2 VIEW  Comparison: 09/27/2012  Findings: Normal heart size and pulmonary vascularity.  Linear infiltration or atelectasis in the lung bases, increasing since previous study.  Scattered fibrosis in the lungs.  Hyperinflation and upper lung lucency consistent with emphysema.  No blunting of costophrenic angles.  No pneumothorax.  Mediastinal contours appear intact.  Mild degenerative changes in the spine. The left lung nodules seen previously is not appreciated on today's study.  IMPRESSION: Increasing linear infiltration or atelectasis in the lung bases. Chronic emphysematous changes and scattered fibrosis.   Original Report Authenticated By: Burman Nieves, M.D.      No diagnosis found.    MDM  Labs and imaging ordered in triage as well as neb and Percocet. Pt is here with his wife who also has several recent visits for her chronic pain. They both have a safe ride home. Will start Z-pak for possible early CAP seen on CXR. Advised close PCP follow up.         Charles B. Bernette Mayers, MD 10/19/12 1610

## 2012-10-25 NOTE — Addendum Note (Signed)
Addended by: Neomia Dear on: 10/25/2012 08:39 PM   Modules accepted: Orders

## 2012-10-26 ENCOUNTER — Emergency Department (HOSPITAL_COMMUNITY)
Admission: EM | Admit: 2012-10-26 | Discharge: 2012-10-26 | Disposition: A | Discharge status: Home / Self Care | Payer: Medicare Other | Attending: Emergency Medicine | Admitting: Emergency Medicine

## 2012-10-26 ENCOUNTER — Encounter (HOSPITAL_COMMUNITY): Payer: Self-pay | Admitting: Emergency Medicine

## 2012-10-26 DIAGNOSIS — Z8709 Personal history of other diseases of the respiratory system: Secondary | ICD-10-CM | POA: Insufficient documentation

## 2012-10-26 DIAGNOSIS — Z21 Asymptomatic human immunodeficiency virus [HIV] infection status: Secondary | ICD-10-CM | POA: Insufficient documentation

## 2012-10-26 DIAGNOSIS — Z8679 Personal history of other diseases of the circulatory system: Secondary | ICD-10-CM | POA: Insufficient documentation

## 2012-10-26 DIAGNOSIS — G8929 Other chronic pain: Secondary | ICD-10-CM | POA: Insufficient documentation

## 2012-10-26 DIAGNOSIS — I10 Essential (primary) hypertension: Secondary | ICD-10-CM | POA: Insufficient documentation

## 2012-10-26 DIAGNOSIS — I2510 Atherosclerotic heart disease of native coronary artery without angina pectoris: Secondary | ICD-10-CM | POA: Insufficient documentation

## 2012-10-26 DIAGNOSIS — Z8701 Personal history of pneumonia (recurrent): Secondary | ICD-10-CM | POA: Insufficient documentation

## 2012-10-26 DIAGNOSIS — Z9889 Other specified postprocedural states: Secondary | ICD-10-CM | POA: Insufficient documentation

## 2012-10-26 DIAGNOSIS — M545 Low back pain, unspecified: Secondary | ICD-10-CM | POA: Insufficient documentation

## 2012-10-26 DIAGNOSIS — M19019 Primary osteoarthritis, unspecified shoulder: Secondary | ICD-10-CM | POA: Insufficient documentation

## 2012-10-26 DIAGNOSIS — Z87891 Personal history of nicotine dependence: Secondary | ICD-10-CM | POA: Insufficient documentation

## 2012-10-26 DIAGNOSIS — Z79899 Other long term (current) drug therapy: Secondary | ICD-10-CM | POA: Insufficient documentation

## 2012-10-26 DIAGNOSIS — Z8661 Personal history of infections of the central nervous system: Secondary | ICD-10-CM | POA: Insufficient documentation

## 2012-10-26 DIAGNOSIS — M549 Dorsalgia, unspecified: Secondary | ICD-10-CM

## 2012-10-26 MED ORDER — MORPHINE SULFATE 4 MG/ML IJ SOLN
6.0000 mg | Freq: Once | INTRAMUSCULAR | Status: AC
  Administered 2012-10-26: 6 mg via INTRAMUSCULAR
  Filled 2012-10-26: qty 2

## 2012-10-26 MED ORDER — DEXAMETHASONE SODIUM PHOSPHATE 10 MG/ML IJ SOLN
10.0000 mg | Freq: Once | INTRAMUSCULAR | Status: AC
  Administered 2012-10-26: 10 mg via INTRAMUSCULAR
  Filled 2012-10-26: qty 1

## 2012-10-26 MED ORDER — OXYCODONE-ACETAMINOPHEN 5-325 MG PO TABS: ORAL_TABLET | ORAL | Status: DC

## 2012-10-26 NOTE — ED Notes (Signed)
Pt c/o low back pain onset this morning. Pt spoke with PMD and was told to come to ED. Pain radiates down B/L legs. Pt denies urinary symptoms.

## 2012-10-26 NOTE — ED Provider Notes (Signed)
History  This chart was scribed for non-physician practitioner, Wynetta Emery, PA-C working with Loren Racer, MD by Shari Heritage, ED Scribe. This patient was seen in room TR07C/TR07C and the patient's care was started at 1748.   CSN: [id number]  Arrival date & time 10/26/12  1729   First MD Initiated Contact with Patient 10/26/12 1748      Chief Complaint  Patient presents with  . Back Pain     The history is provided by the patient. No language interpreter was used.     HPI Comments: Kenneth [name] is a 57 y.o. male with history of chronic back pain who presents to the Emergency Department complaining of moderate to severe, constant, dull back pain that radiates down both legs onset 8.5 hours ago. Patient rates pain as 10/10. He says pain began this morning while he was sitting down at church. Pain is worse on the right. Pain is exacerbated with ambulation, but patient is ambulatory. He denies bowel or bladder incontinence. Patient states that he took Tylenol this morning but it did not relieve pain at all. Patient states allergies to tramadol, Motrin and naproxen. Patient says that he has had multiple back surgeries in the past. He states that his orthopedist recommended another, but he has declined. He has an appointment with his PCP in March, but he could not wait for pain relief. Patient denies history of cancer. He denies any IV drug use. Patient's other medical history includes HIV, hypertension, coronary artery disease, chronic bronchitis, exertional dyspnea, arthritis and pneumonia.    Past Medical History  Diagnosis Date  . HIV positive 2004  . Hypertension   . Meningitis   . Coronary artery disease   . Chest pain at rest 09/23/2012  . Chronic bronchitis     "q year last 5 yr or so" (09/24/2012)  . Exertional dyspnea   . Migraines   . Arthritis     "both shoulders" (09/24/2012)  . Chronic lower back pain   . Pneumonia     Past Surgical History  Procedure Laterality Date   . Intussusception repair  10/2011  . Posterior lumbar fusion  1995  . Elbow surgery  ~ 1997    "removed some stones; right" (09/24/2012)  . Appendectomy  2013  . Back surgery    . Abdominal surgery      No family history on file.  History  Substance Use Topics  . Smoking status: Former Smoker -- 0.50 packs/day for 45 years    Types: Cigarettes    Quit date: 12/18/2011  . Smokeless tobacco: Never Used     Comment: 09/24/2012 "been stopped now ~ 8 month"  . Alcohol Use: No      Review of Systems  Constitutional: Negative for fever.  Respiratory: Negative for shortness of breath.   Cardiovascular: Negative for chest pain.  Gastrointestinal: Negative for nausea, vomiting, abdominal pain and diarrhea.  Musculoskeletal: Positive for back pain.  All other systems reviewed and are negative.    Allergies  Cyclobenzaprine; Flexeril; Hydrocodone; Nabumetone; [name]; Tramadol; Bee venom; and Other  Home Medications   Current Outpatient Rx  Name  Route  Sig  Dispense  Refill  . acetaminophen (TYLENOL) 500 MG tablet   Oral   Take 2 tablets (1,000 mg total) by mouth every 6 (six) hours as needed for pain.   30 tablet   0   . albuterol-ipratropium (COMBIVENT) 18-103 MCG/ACT inhaler   Inhalation   Inhale 2 puffs into the lungs every  6 (six) hours as needed. For shortness of breath         . azithromycin (ZITHROMAX) 250 MG tablet   Oral   Take 1 tablet (250 mg total) by mouth daily.   4 tablet   0   . diazepam (VALIUM) 5 MG tablet   Oral   Take 1 tablet (5 mg total) by mouth every 6 (six) hours as needed (muscle strain).   30 tablet   0   . famotidine (PEPCID) 10 MG tablet   Oral   Take 10 mg by mouth daily as needed. For heartburn.         . Multiple Vitamin (MULITIVITAMIN WITH MINERALS) TABS   Oral   Take 1 tablet by mouth daily.         . ramipril (ALTACE) 2.5 MG capsule   Oral   Take 2.5 mg by mouth daily.         Marland Kitchen senna-docusate (SENOKOT-S) 8.6-50  MG per tablet   Oral   Take 1 tablet by mouth daily as needed. For constipation.           Triage Vitals: BP 164/103  Pulse 96  Temp(Src) 97.7 F (36.5 C) (Oral)  Resp 20  SpO2 97%  Physical Exam  Nursing note and vitals reviewed. Constitutional: He is oriented to person, place, and time. He appears well-developed and well-nourished. No distress.  HENT:  Head: Normocephalic.  Eyes: Conjunctivae and EOM are normal.  Cardiovascular: Normal rate.   Pulmonary/Chest: Effort normal. No stridor.  Musculoskeletal: Normal range of motion.  Positive straight leg raise of 10 degrees on the contralateral side. Positive straight leg raise of 10 degrees on the ipsilateral side.  Neurological: He is alert and oriented to person, place, and time.  Psychiatric: He has a normal mood and affect.    ED Course  Procedures (including critical care time) DIAGNOSTIC STUDIES: Oxygen Saturation is 97% on room air, adequate by my interpretation.    COORDINATION OF CARE: 7:29 PM- Patient informed of current plan for treatment and evaluation and agrees with plan at this time.   9:15PM- Will give Decadron 10 mg and morphine 4 mg/ml injection 6 mg.   Labs Reviewed - No data to display No results found.   1. Back pain       MDM   Patient is ambulatory without issue.  Filed Vitals:   10/26/12 1743  BP: 164/103  Pulse: 96  Temp: 97.7 F (36.5 C)  TempSrc: Oral  Resp: 20  SpO2: 97%     Pt verbalized understanding and agrees with care plan. Outpatient follow-up and return precautions given.    Discharge Medication List as of 10/26/2012  7:36 PM    START taking these medications   Details  !! oxyCODONE-acetaminophen (PERCOCET/ROXICET) 5-325 MG per tablet 1 to 2 tabs PO q6hrs  PRN for pain, Print     !! - Potential duplicate medications found. Please discuss with provider.      I personally performed the services described in this documentation, which was scribed in my presence.  The recorded information has been reviewed and is accurate.    Wynetta Emery, PA-C 10/27/12 1732

## 2012-10-26 NOTE — ED Notes (Signed)
Pt c/o lower back pain, pt reports he has had chronic back pain but this time episode is causing him a lot of pain. Pt reports he is supposed to have back surgery but hasn't scheduled a date yet and is supposed to be set up with a pain management clinic soon. Pt in nad. Can move all extremities. Pt denies urinary issues.

## 2012-10-28 NOTE — ED Provider Notes (Signed)
Medical screening examination/treatment/procedure(s) were performed by non-physician practitioner and as supervising physician I was immediately available for consultation/collaboration.   Loren Racer, MD 10/28/12 1535

## 2012-11-01 ENCOUNTER — Emergency Department (HOSPITAL_COMMUNITY)
Admission: EM | Admit: 2012-11-01 | Discharge: 2012-11-01 | Disposition: A | Discharge status: Home / Self Care | Payer: Medicare Other | Attending: Emergency Medicine | Admitting: Emergency Medicine

## 2012-11-01 ENCOUNTER — Encounter (HOSPITAL_COMMUNITY): Payer: Self-pay | Admitting: Emergency Medicine

## 2012-11-01 DIAGNOSIS — Z79899 Other long term (current) drug therapy: Secondary | ICD-10-CM | POA: Insufficient documentation

## 2012-11-01 DIAGNOSIS — Z8701 Personal history of pneumonia (recurrent): Secondary | ICD-10-CM | POA: Insufficient documentation

## 2012-11-01 DIAGNOSIS — M79605 Pain in left leg: Secondary | ICD-10-CM

## 2012-11-01 DIAGNOSIS — Z87891 Personal history of nicotine dependence: Secondary | ICD-10-CM | POA: Insufficient documentation

## 2012-11-01 DIAGNOSIS — I10 Essential (primary) hypertension: Secondary | ICD-10-CM | POA: Insufficient documentation

## 2012-11-01 DIAGNOSIS — M129 Arthropathy, unspecified: Secondary | ICD-10-CM | POA: Insufficient documentation

## 2012-11-01 DIAGNOSIS — B20 Human immunodeficiency virus [HIV] disease: Secondary | ICD-10-CM | POA: Insufficient documentation

## 2012-11-01 DIAGNOSIS — F909 Attention-deficit hyperactivity disorder, unspecified type: Secondary | ICD-10-CM | POA: Insufficient documentation

## 2012-11-01 DIAGNOSIS — Z8679 Personal history of other diseases of the circulatory system: Secondary | ICD-10-CM | POA: Insufficient documentation

## 2012-11-01 DIAGNOSIS — R51 Headache: Secondary | ICD-10-CM | POA: Insufficient documentation

## 2012-11-01 DIAGNOSIS — M545 Low back pain, unspecified: Secondary | ICD-10-CM | POA: Insufficient documentation

## 2012-11-01 DIAGNOSIS — G8929 Other chronic pain: Secondary | ICD-10-CM | POA: Insufficient documentation

## 2012-11-01 DIAGNOSIS — J42 Unspecified chronic bronchitis: Secondary | ICD-10-CM | POA: Insufficient documentation

## 2012-11-01 DIAGNOSIS — M79609 Pain in unspecified limb: Secondary | ICD-10-CM | POA: Insufficient documentation

## 2012-11-01 DIAGNOSIS — M79604 Pain in right leg: Secondary | ICD-10-CM

## 2012-11-01 LAB — BASIC METABOLIC PANEL
BUN: 13 mg/dL (ref 6–23)
CO2: 23 mEq/L (ref 19–32)
Calcium: 9.4 mg/dL (ref 8.4–10.5)
Chloride: 103 mEq/L (ref 96–112)
Creatinine, Ser: 0.76 mg/dL (ref 0.50–1.35)
GFR calc Af Amer: >90 mL/min (ref >90)
GFR calc non Af Amer: >90 mL/min (ref >90)
Glucose, Bld: 85 mg/dL (ref 70–99)
Potassium: 3.5 mEq/L (ref 3.5–5.1)
Sodium: 134 mEq/L — ABNORMAL LOW (ref 135–145)

## 2012-11-01 LAB — CBC WITH DIFFERENTIAL/PLATELET
Basophils Absolute: 0 10*3/uL (ref 0.0–0.1)
Basophils Relative: 0 % (ref 0–1)
Eosinophils Absolute: 0 10*3/uL (ref 0.0–0.7)
Eosinophils Relative: 0 % (ref 0–5)
HCT: 40.8 % (ref 39.0–52.0)
Hemoglobin: 13.7 g/dL (ref 13.0–17.0)
Lymphocytes Relative: 22 % (ref 12–46)
Lymphs Abs: 2.3 10*3/uL (ref 0.7–4.0)
MCH: 27.5 pg (ref 26.0–34.0)
MCHC: 33.6 g/dL (ref 30.0–36.0)
MCV: 81.9 fL (ref 78.0–100.0)
Monocytes Absolute: 0.7 10*3/uL (ref 0.1–1.0)
Monocytes Relative: 7 % (ref 3–12)
Neutro Abs: 7.3 10*3/uL (ref 1.7–7.7)
Neutrophils Relative %: 71 % (ref 43–77)
Platelets: 311 10*3/uL (ref 150–400)
RBC: 4.98 MIL/uL (ref 4.22–5.81)
RDW: 15.6 % — ABNORMAL HIGH (ref 11.5–15.5)
WBC: 10.3 10*3/uL (ref 4.0–10.5)

## 2012-11-01 MED ORDER — MORPHINE SULFATE 4 MG/ML IJ SOLN
6.0000 mg | Freq: Once | INTRAMUSCULAR | Status: AC
  Administered 2012-11-01: 6 mg via INTRAMUSCULAR
  Filled 2012-11-01: qty 1
  Filled 2012-11-01: qty 2

## 2012-11-01 MED ORDER — MORPHINE SULFATE 4 MG/ML IJ SOLN
6.0000 mg | Freq: Once | INTRAMUSCULAR | Status: AC
  Administered 2012-11-01: 6 mg via INTRAVENOUS
  Filled 2012-11-01: qty 2

## 2012-11-01 MED ORDER — OXYCODONE-ACETAMINOPHEN 5-325 MG PO TABS: 2.0000 | ORAL_TABLET | Freq: Four times a day (QID) | ORAL | Status: DC | PRN

## 2012-11-01 MED ORDER — DIAZEPAM 2 MG PO TABS
2.0000 mg | ORAL_TABLET | Freq: Once | ORAL | Status: AC
  Administered 2012-11-01: 2 mg via ORAL
  Filled 2012-11-01: qty 1

## 2012-11-01 NOTE — ED Notes (Signed)
Yellow arm band and red socks placed on patient and assisted him to stand up with the MD

## 2012-11-01 NOTE — ED Notes (Addendum)
Patient slipped however caught self from falling. Developed lower back pain radiating to bilateral extremities currently 10/10 achy sharp pain. Headache 10/10 throbbing with light sensitivity. Seen in ED few days ago for same not feeling better.

## 2012-11-01 NOTE — ED Provider Notes (Signed)
History     CSN: [id number]  Arrival date & time 11/01/12  1623   First MD Initiated Contact with Patient 11/01/12 1922      Chief Complaint  Patient presents with  . Headache  . Back Pain    (Consider location/radiation/quality/duration/timing/severity/associated sxs/prior treatment) Patient is a 57 y.o. male presenting with back pain.  Back Pain Location:  Lumbar spine Quality:  Aching and stabbing Radiates to:  R thigh and L thigh Pain severity:  Severe Pain is:  Same all the time Onset quality:  Sudden Timing:  Constant Progression:  Unchanged Chronicity:  Chronic Context: falling   Relieved by:  Nothing Worsened by:  Nothing tried Ineffective treatments:  None tried Associated symptoms: headaches and leg pain   Associated symptoms: no abdominal pain, no bladder incontinence, no bowel incontinence, no chest pain, no dysuria, no fever, no numbness, no paresthesias, no perianal numbness, no tingling and no weakness   Risk factors: no hx of cancer     Past Medical History  Diagnosis Date  . HIV positive 2004  . Hypertension   . Meningitis   . Coronary artery disease   . Chest pain at rest 09/23/2012  . Chronic bronchitis     "q year last 5 yr or so" (09/24/2012)  . Exertional dyspnea   . Migraines   . Arthritis     "both shoulders" (09/24/2012)  . Chronic lower back pain   . Pneumonia     Past Surgical History  Procedure Laterality Date  . Intussusception repair  10/2011  . Posterior lumbar fusion  1995  . Elbow surgery  ~ 1997    "removed some stones; right" (09/24/2012)  . Appendectomy  2013  . Back surgery    . Abdominal surgery      No family history on file.  History  Substance Use Topics  . Smoking status: Former Smoker -- 0.50 packs/day for 45 years    Types: Cigarettes    Quit date: 12/18/2011  . Smokeless tobacco: Never Used     Comment: 09/24/2012 "been stopped now ~ 8 month"  . Alcohol Use: No      Review of Systems  Constitutional:  Negative for fever, chills, diaphoresis, activity change, appetite change and fatigue.  HENT: Negative for congestion, sore throat, facial swelling, rhinorrhea, drooling, neck pain and voice change.   Respiratory: Negative for shortness of breath and stridor.   Cardiovascular: Negative for chest pain.  Gastrointestinal: Negative for nausea, vomiting, abdominal pain, diarrhea, abdominal distention and bowel incontinence.  Endocrine: Negative for polydipsia and polyuria.  Genitourinary: Negative for bladder incontinence, dysuria, urgency, frequency and decreased urine volume.  Musculoskeletal: Positive for back pain. Negative for gait problem.  Skin: Negative for color change and wound.  Neurological: Positive for headaches. Negative for tingling, facial asymmetry, weakness, numbness and paresthesias.  Hematological: Does not bruise/bleed easily.  Psychiatric/Behavioral: Negative for confusion and agitation.    Allergies  Cyclobenzaprine; Flexeril; Hydrocodone; Nabumetone; [name]; Tramadol; Bee venom; and Other  Home Medications   Current Outpatient Rx  Name  Route  Sig  Dispense  Refill  . albuterol-ipratropium (COMBIVENT) 18-103 MCG/ACT inhaler   Inhalation   Inhale 2 puffs into the lungs every 6 (six) hours as needed. For shortness of breath         . diazepam (VALIUM) 10 MG tablet   Oral   Take 10 mg by mouth every 6 (six) hours as needed (for muscle spasms).         Marland Kitchen  Multiple Vitamin (MULITIVITAMIN WITH MINERALS) TABS   Oral   Take 1 tablet by mouth daily.         Marland Kitchen oxyCODONE-acetaminophen (PERCOCET/ROXICET) 5-325 MG per tablet   Oral   Take 1 tablet by mouth every 4 (four) hours as needed for pain.          . ramipril (ALTACE) 2.5 MG capsule   Oral   Take 1.25 mg by mouth 2 (two) times daily.          Marland Kitchen oxyCODONE-acetaminophen (PERCOCET/ROXICET) 5-325 MG per tablet   Oral   Take 2 tablets by mouth every 6 (six) hours as needed for pain.   10 tablet   0      BP 151/98  Pulse 70  Temp(Src) 98.6 F (37 C)  Resp 16  SpO2 95%  Physical Exam  Constitutional: He is oriented to person, place, and time. He appears well-developed and well-nourished. No distress.  HENT:  Head: Normocephalic and atraumatic.  Mouth/Throat: No oropharyngeal exudate.  Eyes: Pupils are equal, round, and reactive to light.  Neck: Normal range of motion. Neck supple.  Cardiovascular: Normal rate, regular rhythm and normal heart sounds.  Exam reveals no gallop and no friction rub.   No murmur heard. Pulmonary/Chest: Effort normal and breath sounds normal. No respiratory distress. He has no wheezes. He has no rales.  Abdominal: Soft. Bowel sounds are normal. He exhibits no distension and no mass. There is no tenderness. There is no rebound and no guarding.  Musculoskeletal: Normal range of motion. He exhibits no edema and no tenderness.  Neurological: He is alert and oriented to person, place, and time. He has normal strength. He displays no tremor. No cranial nerve deficit or sensory deficit. Coordination and gait normal. GCS eye subscore is 4. GCS verbal subscore is 5. GCS motor subscore is 6.  Skin: Skin is warm and dry.  Psychiatric: He has a normal mood and affect.    ED Course  Procedures (including critical care time)  Labs Reviewed  CBC WITH DIFFERENTIAL - Abnormal; Notable for the following:    RDW 15.6 (*)    All other components within normal limits  BASIC METABOLIC PANEL - Abnormal; Notable for the following:    Sodium 134 (*)    All other components within normal limits   No results found.   1. Low back pain radiating to both legs       MDM   Pt is a 57 y.o. male with a PMHX of HIV, HTN, chronic low back pain who presents w/ acute exacerbation of his chronic low back pain after slipping and nearly falling on ice just PTA.  No numbness, weakness, fever, bowel or bladder incontinence, saddle anesthesia.  Pain location similar to prior  exacerbations w/ pain radiating down both legs. Neuro exam benign, able to ambulate slowly.  Given injury mechanism and hx of multiple exacerbations, I do not feel imaging will be helpful.  Doubt cord compression, cauda equina, or acute fracture and feel this is more likely acute sprain/strain. Pt treated with IM narcotics and PO valium with improvement of symptoms.  Pt will f/u with PCP on Monday.  Return precautions given for new or worsening symptoms.     1. Low back pain radiating to both legs       Toy Cookey, MD 11/02/12 0105

## 2012-11-01 NOTE — ED Notes (Signed)
States he was coming down the steps and slipped.  Stated he felt his head snap back and since then he c/o neck pain.   Also c/o bilateral back pain.  Has trouble moving his legs, his head has been hurting since the fall.  The pain in his legs travels down his legs down to his heels.

## 2012-11-02 NOTE — ED Provider Notes (Signed)
   I saw and evaluated the patient, reviewed the resident's note and I agree with the findings and plan.  Pt with exacerbation of chronic low back pain after slipping on ice.  No focal neuro deficits, given analgesics with some improvement.  Ok for outpt follow up  Gavin Pound. Shylyn Younce, MD 11/02/12 2359

## 2012-11-12 ENCOUNTER — Encounter (HOSPITAL_COMMUNITY): Payer: Self-pay | Admitting: Cardiology

## 2012-11-12 ENCOUNTER — Emergency Department (HOSPITAL_COMMUNITY)
Admission: EM | Admit: 2012-11-12 | Discharge: 2012-11-12 | Disposition: A | Discharge status: Home / Self Care | Payer: Medicare Other | Attending: Emergency Medicine | Admitting: Emergency Medicine

## 2012-11-12 DIAGNOSIS — Z9889 Other specified postprocedural states: Secondary | ICD-10-CM | POA: Insufficient documentation

## 2012-11-12 DIAGNOSIS — Z87891 Personal history of nicotine dependence: Secondary | ICD-10-CM | POA: Insufficient documentation

## 2012-11-12 DIAGNOSIS — I10 Essential (primary) hypertension: Secondary | ICD-10-CM | POA: Insufficient documentation

## 2012-11-12 DIAGNOSIS — Z8739 Personal history of other diseases of the musculoskeletal system and connective tissue: Secondary | ICD-10-CM | POA: Insufficient documentation

## 2012-11-12 DIAGNOSIS — M545 Low back pain, unspecified: Secondary | ICD-10-CM | POA: Insufficient documentation

## 2012-11-12 DIAGNOSIS — Z21 Asymptomatic human immunodeficiency virus [HIV] infection status: Secondary | ICD-10-CM | POA: Insufficient documentation

## 2012-11-12 DIAGNOSIS — G8929 Other chronic pain: Secondary | ICD-10-CM | POA: Insufficient documentation

## 2012-11-12 DIAGNOSIS — Z8709 Personal history of other diseases of the respiratory system: Secondary | ICD-10-CM | POA: Insufficient documentation

## 2012-11-12 DIAGNOSIS — I2510 Atherosclerotic heart disease of native coronary artery without angina pectoris: Secondary | ICD-10-CM | POA: Insufficient documentation

## 2012-11-12 DIAGNOSIS — Z8669 Personal history of other diseases of the nervous system and sense organs: Secondary | ICD-10-CM | POA: Insufficient documentation

## 2012-11-12 DIAGNOSIS — Z8701 Personal history of pneumonia (recurrent): Secondary | ICD-10-CM | POA: Insufficient documentation

## 2012-11-12 DIAGNOSIS — Z79899 Other long term (current) drug therapy: Secondary | ICD-10-CM | POA: Insufficient documentation

## 2012-11-12 MED ORDER — DEXAMETHASONE SODIUM PHOSPHATE 10 MG/ML IJ SOLN
10.0000 mg | Freq: Once | INTRAMUSCULAR | Status: AC
  Administered 2012-11-12: 10 mg via INTRAMUSCULAR
  Filled 2012-11-12: qty 1

## 2012-11-12 MED ORDER — MORPHINE SULFATE 4 MG/ML IJ SOLN
4.0000 mg | Freq: Once | INTRAMUSCULAR | Status: AC
  Administered 2012-11-12: 4 mg via INTRAMUSCULAR
  Filled 2012-11-12: qty 1

## 2012-11-12 MED ORDER — METHOCARBAMOL 500 MG PO TABS: 500.0000 mg | ORAL_TABLET | Freq: Two times a day (BID) | ORAL | Status: DC | PRN

## 2012-11-12 MED ORDER — MORPHINE SULFATE 4 MG/ML IJ SOLN: 4.0000 mg | Freq: Once | INTRAMUSCULAR | Status: DC

## 2012-11-12 NOTE — ED Notes (Signed)
Pt report chronic back pain from an injury back in 1995. States he has had back surgery since then and take pain medication. Tried to get into to see his PCP but was told he could not get an appt til the 4th. States he is out of his pain medication. No new injury noted.

## 2012-11-12 NOTE — ED Provider Notes (Signed)
Medical screening examination/treatment/procedure(s) were performed by non-physician practitioner and as supervising physician I was immediately available for consultation/collaboration.   Shelda Jakes, MD 11/12/12 [phone]

## 2012-11-12 NOTE — ED Provider Notes (Signed)
History     CSN: [id number]  Arrival date & time 11/12/12  1225   First MD Initiated Contact with Patient 11/12/12 1331      Chief Complaint  Patient presents with  . Back Pain    (Consider location/radiation/quality/duration/timing/severity/associated sxs/prior treatment) HPI Comments: 57 y.o. Male presents with exacerbation of chronic lower back pain. Pt has multiple visits to the ED for same complaint, always requesting pain medication, most recently 10/26/12 and 11/01/12. Pt states multiple allergies which narrows the window of tolerated medications. Pt states pain is 10/10, localized. Pt did not try any interventions. Pt states he has an appointment with a chronic pain management doctor on March 4th at 9:15. He could not remember her name, but said his wife was in the waiting room and she had the doctor's name. The wife could not be located to provide the name of the doctor.   Pt denies numbness, abdominal pain, fever, chest pain, shortness of breath, recent injury, bladder/bowel incontinence, dysuria, paresthesias, perianal numbness, tingling, or weakness.   Patient is a 57 y.o. male presenting with back pain.  Back Pain Associated symptoms: no abdominal pain, no chest pain, no dysuria, no fever, no headaches, no numbness and no weakness     Past Medical History  Diagnosis Date  . HIV positive 2004  . Hypertension   . Meningitis   . Coronary artery disease   . Chest pain at rest 09/23/2012  . Chronic bronchitis     "q year last 5 yr or so" (09/24/2012)  . Exertional dyspnea   . Migraines   . Arthritis     "both shoulders" (09/24/2012)  . Chronic lower back pain   . Pneumonia     Past Surgical History  Procedure Laterality Date  . Intussusception repair  10/2011  . Posterior lumbar fusion  1995  . Elbow surgery  ~ 1997    "removed some stones; right" (09/24/2012)  . Appendectomy  2013  . Back surgery    . Abdominal surgery      History reviewed. No pertinent family  history.  History  Substance Use Topics  . Smoking status: Former Smoker -- 0.50 packs/day for 45 years    Types: Cigarettes    Quit date: 12/18/2011  . Smokeless tobacco: Never Used     Comment: 09/24/2012 "been stopped now ~ 8 month"  . Alcohol Use: No      Review of Systems  Constitutional: Negative for fever and diaphoresis.  HENT: Negative for neck pain and neck stiffness.   Eyes: Negative for visual disturbance.  Respiratory: Negative for apnea, chest tightness and shortness of breath.   Cardiovascular: Negative for chest pain and palpitations.  Gastrointestinal: Negative for nausea, vomiting, abdominal pain, diarrhea and constipation.  Genitourinary: Negative for dysuria.  Musculoskeletal: Positive for back pain. Negative for gait problem.       Lumbar back, chronic   Skin: Negative for rash.  Neurological: Negative for dizziness, weakness, light-headedness, numbness and headaches.    Allergies  Cyclobenzaprine; Flexeril; Hydrocodone; Nabumetone; [name]; Tramadol; Bee venom; and Other  Home Medications   Current Outpatient Rx  Name  Route  Sig  Dispense  Refill  . Multiple Vitamin (MULITIVITAMIN WITH MINERALS) TABS   Oral   Take 1 tablet by mouth daily.         . ramipril (ALTACE) 2.5 MG tablet   Oral   Take 1.25 mg by mouth 2 (two) times daily.         Marland Kitchen  albuterol-ipratropium (COMBIVENT) 18-103 MCG/ACT inhaler   Inhalation   Inhale 2 puffs into the lungs every 6 (six) hours as needed. For shortness of breath         . methocarbamol (ROBAXIN) 500 MG tablet   Oral   Take 1 tablet (500 mg total) by mouth 2 (two) times daily as needed.   20 tablet   0     BP 151/121  Pulse 100  Temp(Src) 98 F (36.7 C) (Oral)  Resp 20  SpO2 96%  Physical Exam  Nursing note and vitals reviewed. Constitutional: He is oriented to person, place, and time. He appears well-developed and well-nourished. No distress.  HENT:  Head: Normocephalic and atraumatic.   Eyes: EOM are normal. Pupils are equal, round, and reactive to light.  Neck: Normal range of motion. Neck supple.  No meningeal signs  Cardiovascular: Normal rate, regular rhythm and normal heart sounds.  Exam reveals no gallop and no friction rub.   No murmur heard. Pulmonary/Chest: Effort normal and breath sounds normal. No respiratory distress. He has no wheezes. He has no rales. He exhibits no tenderness.  Abdominal: Soft. Bowel sounds are normal. He exhibits no distension. There is no tenderness. There is no rebound and no guarding.  Musculoskeletal: Normal range of motion. He exhibits no edema and no tenderness.  Neurological: He is alert and oriented to person, place, and time. No cranial nerve deficit.  No focal deficits, sensation to light touch intact  Skin: Skin is warm and dry. He is not diaphoretic. No erythema.    ED Course  Procedures (including critical care time)  Labs Reviewed - No data to display No results found.   1. Chronic back pain greater than 3 months duration       MDM  Pt has multiple visits to the ED for same complaint, always requesting pain medication, most recently 10/26/12 and 11/01/12. Pt states multiple allergies which narrows the window of tolerated medications. Today this pt presents with no neurological deficits and normal neuro exam.  Patient can walk but states is painful.  No loss of bowel or bladder control.  No concern for cauda equina.  No fever, night sweats, weight loss, h/o cancer, IVDU.  RICE protocol discussed with patient.   Treated pt pain in ED, provided robaxin prescription as pt has been prescribed narcotics on multiple occasions for the same pain. Emphasized the importance of following up with a pain management doctor and provided resource list. Pt states he has an appointment of March 4th but couldn't remember the name of the doctor.  At this time there does not appear to be any evidence of an acute emergency medical condition and  the patient appears stable for discharge with appropriate outpatient follow up.Diagnosis was discussed with patient who verbalizes understanding and is agreeable to discharge.   Glade Nurse, PA-C 11/12/12 1551

## 2012-11-19 ENCOUNTER — Encounter: Payer: Self-pay | Admitting: Internal Medicine

## 2012-11-19 ENCOUNTER — Ambulatory Visit (INDEPENDENT_AMBULATORY_CARE_PROVIDER_SITE_OTHER): Payer: Medicare Other | Admitting: Internal Medicine

## 2012-11-19 VITALS — BP 135/88 | HR 89 | Temp 97.4°F | Ht 69.0 in | Wt 164.1 lb

## 2012-11-19 DIAGNOSIS — Z0000 Encounter for general adult medical examination without abnormal findings: Secondary | ICD-10-CM | POA: Insufficient documentation

## 2012-11-19 DIAGNOSIS — M545 Low back pain, unspecified: Secondary | ICD-10-CM

## 2012-11-19 DIAGNOSIS — I2510 Atherosclerotic heart disease of native coronary artery without angina pectoris: Secondary | ICD-10-CM

## 2012-11-19 DIAGNOSIS — J438 Other emphysema: Secondary | ICD-10-CM

## 2012-11-19 DIAGNOSIS — G8929 Other chronic pain: Secondary | ICD-10-CM

## 2012-11-19 DIAGNOSIS — J439 Emphysema, unspecified: Secondary | ICD-10-CM

## 2012-11-19 DIAGNOSIS — I10 Essential (primary) hypertension: Secondary | ICD-10-CM

## 2012-11-19 MED ORDER — RAMIPRIL 1.25 MG PO TABS: 1.2500 mg | ORAL_TABLET | Freq: Two times a day (BID) | ORAL | Status: DC

## 2012-11-19 MED ORDER — IPRATROPIUM-ALBUTEROL 18-103 MCG/ACT IN AERO: 2.0000 | INHALATION_SPRAY | Freq: Four times a day (QID) | RESPIRATORY_TRACT | Status: DC | PRN

## 2012-11-19 NOTE — Assessment & Plan Note (Signed)
Unclear about this diagnosis. Will request records from patient's previous doctor as he did not know about his history.

## 2012-11-19 NOTE — Assessment & Plan Note (Signed)
Patient is not currently taking any medication for his blood pressure. He states that he ran out about 6 months ago and has not refilled it.  BP Readings from Last 3 Encounters:  11/19/12 135/88  11/12/12 151/121  11/01/12 151/98    Lab Results  Component Value Date   NA 134* 11/01/2012   K 3.5 11/01/2012   CREATININE 0.76 11/01/2012    -New prescription given for Altace, encouraged patient to get it from the pharmacy

## 2012-11-19 NOTE — Assessment & Plan Note (Signed)
We do not have records for PFTs for this patient. He states that he was diagnosed 2 years ago. He is not take any inhalers currently.  -Prescription given for Combivent which is what he was on previously -PFT referral given

## 2012-11-19 NOTE — Assessment & Plan Note (Addendum)
Patient needs screening colonoscopy, has never had one before.  -referral given

## 2012-11-19 NOTE — Assessment & Plan Note (Addendum)
Patient was stable chronic lower back pain. No red flag symptoms. Patient requesting narcotic medications as well as benzodiazepines for his low back pain. We do not have records to help identify an indication for chronic narcotics. Will obtain records from previous MD in [place] including imaging. Patient has extensive list of allergies limiting analgesic medications. Looking the patient up on the [facility] narcotic database, we can see that the patient filled multiple prescriptions for oxycodone, diazepam, and also hydrocodone over the past year. He has used multiple pharmacies, 7 different addresses, 5 different pharmacies, and 15 different providers from [place], [facility], urgent care centers, and hospitals in [place].  I did not feel comfortable prescribing this patient any narcotics until we get more information. He may need a referral to a pain clinic in the future.  -Obtain UDS today -recommended using Tylenol as needed for pain -return to clinic in 1 month

## 2012-11-19 NOTE — Progress Notes (Signed)
Internal Medicine Clinic Visit    HPI:  Kenneth [name] is a 57 y.o. year old male with a history of HIV, hypertension, chronic back pain, migraines, and 9 ED visits in the past 6 months, who presents to establish care with our clinic.  Patient states that he has been doing "badly" recently and would like to discuss his pain medications. He states that he has chronic back pain that started after an injury in 1995 after which he was in a wheelchair and unable to walk for a year and half. He has had several back surgeries most recently in 2006 for another fusion. He states that he hurts from his neck all it down to the spine and radiating to both legs, 10/10. He has tried physical therapy in the remote past. He states the pain hurts all the time, worse with activity. He states that he usually takes diazepam and oxycodone but he ran out several days ago. He gets his medications prescribed from the emergency room, he has not had her regular doctor who prescribes his medications. He moved here from [facility] one year ago.  Denies fever, chills, saddle anesthesia, weakness or numbness. Denies cocaine, marijuana, or other drug use (although medical record indicated he last used cocaine 2 months ago).  Has not had a screening colonoscopy, is not taking his blood pressure medications current disease he ran out over 6 months ago),and he does not take any inhalers for his COPD.   His wife is a patient at this clinic also.    Past Medical History  Diagnosis Date  . HIV positive 2004  . Hypertension   . Meningitis   . Coronary artery disease   . Chest pain at rest 09/23/2012  . Chronic bronchitis     "q year last 5 yr or so" (09/24/2012)  . Exertional dyspnea   . Migraines   . Arthritis     "both shoulders" (09/24/2012)  . Chronic lower back pain   . Pneumonia     Past Surgical History  Procedure Laterality Date  . Intussusception repair  10/2011  . Posterior lumbar fusion  1995  . Elbow  surgery  ~ 1997    "removed some stones; right" (09/24/2012)  . Appendectomy  2013  . Back surgery    . Abdominal surgery       ROS:  A complete review of systems was otherwise negative, except as noted in the HPI.  Allergies: Cyclobenzaprine; Flexeril; Hydrocodone; Nabumetone; [name]; Tramadol; Bee venom; and Other  Medications: Current Outpatient Prescriptions  Medication Sig Dispense Refill  . albuterol-ipratropium (COMBIVENT) 18-103 MCG/ACT inhaler Inhale 2 puffs into the lungs every 6 (six) hours as needed. For shortness of breath      . methocarbamol (ROBAXIN) 500 MG tablet Take 1 tablet (500 mg total) by mouth 2 (two) times daily as needed.  20 tablet  0  . Multiple Vitamin (MULITIVITAMIN WITH MINERALS) TABS Take 1 tablet by mouth daily.      . ramipril (ALTACE) 2.5 MG tablet Take 1.25 mg by mouth 2 (two) times daily.       No current facility-administered medications for this visit.    History   Social History  . Marital Status: Married    Spouse Name: N/A    Number of Children: N/A  . Years of Education: N/A   Occupational History  . Not on file.   Social History Main Topics  . Smoking status: Former Smoker -- 0.50 packs/day for 45  years    Types: Cigarettes    Quit date: 12/18/2011  . Smokeless tobacco: Never Used     Comment: 09/24/2012 "been stopped now ~ 8 month"  . Alcohol Use: No  . Drug Use: Yes    Special: Cocaine     Comment: last use 09/24/2012   . Sexually Active: No     Comment: declined condoms   Other Topics Concern  . Not on file   Social History Narrative  . No narrative on file    family history is not on file.  Physical Exam Blood pressure 135/88, pulse 89, temperature 97.4 F (36.3 C), temperature source Oral, height 5\' 9"  (1.753 m), weight 164 lb 1.6 oz (74.435 kg), SpO2 95.00%. General:  No acute distress, alert and oriented x 3, thin AAM HEENT:  PERRL, EOMI, no lymphadenopathy, moist mucous membranes Cardiovascular:  Regular  rate and rhythm, no murmurs, rubs or gallops Respiratory:  Clear to auscultation bilaterally, no wheezes, rales, or rhonchi Abdomen:  Soft, nondistended, nontender, +bowel sounds Extremities:  Warm and well-perfused, no clubbing, cyanosis, or edema.  MSK: lumbar spine tender to touch, no numbness, strength 5/5 throughout, no neurologic deficits, cranial nerve exam normal Skin: Warm, dry, no rashes Neuro: normal affect  Labs: Lab Results  Component Value Date   CREATININE 0.76 11/01/2012   BUN 13 11/01/2012   NA 134* 11/01/2012   K 3.5 11/01/2012   CL 103 11/01/2012   CO2 23 11/01/2012   Lab Results  Component Value Date   WBC 10.3 11/01/2012   HGB 13.7 11/01/2012   HCT 40.8 11/01/2012   MCV 81.9 11/01/2012   PLT 311 11/01/2012      Assessment and Plan:  FOLLOWUP: Kenneth [name] will follow back up in our clinic in approximately 1 month. Kenneth [name] knows to call our clinic in the meantime with any questions or new issues.   Case was discussed with Dr. Kem Kays, attending physician.

## 2012-11-19 NOTE — Patient Instructions (Addendum)
Please return to clinic in 1 month for follow up  Please go to [facility] to pick up your medications - Combivent and Altace. Please take these as directed for your COPD and hypertension.

## 2012-11-20 LAB — PRESCRIPTION ABUSE MONITORING 15P, URINE
Amphetamine/Meth: NEGATIVE ng/mL
Barbiturate Screen, Urine: NEGATIVE ng/mL
Buprenorphine, Urine: NEGATIVE ng/mL
Cannabinoid Scrn, Ur: NEGATIVE ng/mL
Carisoprodol, Urine: NEGATIVE ng/mL
Cocaine Metabolites: NEGATIVE ng/mL
Creatinine, Urine: 119.26 mg/dL (ref >20.0)
Fentanyl, Ur: NEGATIVE ng/mL
Meperidine, Ur: NEGATIVE ng/mL
Methadone Screen, Urine: NEGATIVE ng/mL
Propoxyphene: NEGATIVE ng/mL
Tramadol Scrn, Ur: NEGATIVE ng/mL
Zolpidem, Urine: NEGATIVE ng/mL

## 2012-11-21 ENCOUNTER — Encounter: Payer: Self-pay | Admitting: Internal Medicine

## 2012-11-21 ENCOUNTER — Emergency Department (HOSPITAL_COMMUNITY)
Admission: EM | Admit: 2012-11-21 | Discharge: 2012-11-21 | Disposition: A | Discharge status: Home / Self Care | Payer: Medicare Other | Attending: Emergency Medicine | Admitting: Emergency Medicine

## 2012-11-21 ENCOUNTER — Encounter (HOSPITAL_COMMUNITY): Payer: Self-pay | Admitting: Emergency Medicine

## 2012-11-21 ENCOUNTER — Ambulatory Visit (INDEPENDENT_AMBULATORY_CARE_PROVIDER_SITE_OTHER): Payer: Medicare Other | Admitting: Internal Medicine

## 2012-11-21 ENCOUNTER — Other Ambulatory Visit: Payer: Self-pay | Admitting: *Deleted

## 2012-11-21 VITALS — BP 153/99 | HR 76 | Temp 97.7°F | Ht 68.0 in | Wt 165.2 lb

## 2012-11-21 DIAGNOSIS — Z8709 Personal history of other diseases of the respiratory system: Secondary | ICD-10-CM | POA: Insufficient documentation

## 2012-11-21 DIAGNOSIS — Z8739 Personal history of other diseases of the musculoskeletal system and connective tissue: Secondary | ICD-10-CM | POA: Insufficient documentation

## 2012-11-21 DIAGNOSIS — I10 Essential (primary) hypertension: Secondary | ICD-10-CM | POA: Insufficient documentation

## 2012-11-21 DIAGNOSIS — Z87828 Personal history of other (healed) physical injury and trauma: Secondary | ICD-10-CM | POA: Insufficient documentation

## 2012-11-21 DIAGNOSIS — G8929 Other chronic pain: Secondary | ICD-10-CM | POA: Insufficient documentation

## 2012-11-21 DIAGNOSIS — Z79899 Other long term (current) drug therapy: Secondary | ICD-10-CM | POA: Insufficient documentation

## 2012-11-21 DIAGNOSIS — I2510 Atherosclerotic heart disease of native coronary artery without angina pectoris: Secondary | ICD-10-CM | POA: Insufficient documentation

## 2012-11-21 DIAGNOSIS — Z8679 Personal history of other diseases of the circulatory system: Secondary | ICD-10-CM | POA: Insufficient documentation

## 2012-11-21 DIAGNOSIS — Z21 Asymptomatic human immunodeficiency virus [HIV] infection status: Secondary | ICD-10-CM | POA: Insufficient documentation

## 2012-11-21 DIAGNOSIS — Z8669 Personal history of other diseases of the nervous system and sense organs: Secondary | ICD-10-CM | POA: Insufficient documentation

## 2012-11-21 DIAGNOSIS — Z9889 Other specified postprocedural states: Secondary | ICD-10-CM | POA: Insufficient documentation

## 2012-11-21 DIAGNOSIS — Z87891 Personal history of nicotine dependence: Secondary | ICD-10-CM | POA: Insufficient documentation

## 2012-11-21 DIAGNOSIS — M545 Low back pain, unspecified: Secondary | ICD-10-CM | POA: Insufficient documentation

## 2012-11-21 DIAGNOSIS — F1410 Cocaine abuse, uncomplicated: Secondary | ICD-10-CM | POA: Insufficient documentation

## 2012-11-21 DIAGNOSIS — Z8701 Personal history of pneumonia (recurrent): Secondary | ICD-10-CM | POA: Insufficient documentation

## 2012-11-21 DIAGNOSIS — M549 Dorsalgia, unspecified: Secondary | ICD-10-CM

## 2012-11-21 MED ORDER — OXYCODONE-ACETAMINOPHEN 5-325 MG PO TABS
2.0000 | ORAL_TABLET | Freq: Once | ORAL | Status: AC
  Administered 2012-11-21: 2 via ORAL
  Filled 2012-11-21: qty 2

## 2012-11-21 MED ORDER — EMTRICITAB-RILPIVIR-TENOFOV DF 200-25-300 MG PO TABS: 1.0000 | ORAL_TABLET | Freq: Every day | ORAL | Status: DC

## 2012-11-21 NOTE — Progress Notes (Signed)
Patient ID: Kenneth [name], male   DOB: Jul 22, 1956, 57 y.o.   MRN: [id number]          [facility]  Patient Active Problem List  Diagnosis  . Benign recurrent aseptic meningitis  . HIV positive  . HTN (hypertension)  . Asthma  . Coronary artery disease  . Chronic lower back pain  . Chronic midline posterior neck pain  . COPD (chronic obstructive pulmonary disease) with emphysema  . Preventative health care    Patient's Medications  New Prescriptions   EMTRICITAB-RILPIVIR-TENOFOVIR 200-25-300 MG TABS    Take 1 tablet by mouth daily.  Previous Medications   ALBUTEROL-IPRATROPIUM (COMBIVENT) 18-103 MCG/ACT INHALER    Inhale 2 puffs into the lungs every 6 (six) hours as needed. For shortness of breath   MULTIPLE VITAMIN (MULITIVITAMIN WITH MINERALS) TABS    Take 1 tablet by mouth daily.   RAMIPRIL (ALTACE) 1.25 MG TABLET    Take 1 tablet (1.25 mg total) by mouth 2 (two) times daily.  Modified Medications   No medications on file  Discontinued Medications   No medications on file    Subjective: Kenneth [name] is in for his routine visit. He has never been on antiretroviral therapy and is interested in starting if it will help him. He is very focused on his chronic pain and has been upset that his new primary care physician has not agreed to give him narcotic pain medication. He knows he needs to get complete records from his providers in [place], [place] but has not been able to return there yet. He says that his providers did not send records after he signed a release of information.  Objective: Temp: 97.7 F (36.5 C) (03/06 1520) Temp src: Oral (03/06 1520) BP: 153/99 mmHg (03/06 1520) Pulse Rate: 76 (03/06 1520)  General:  He is in no distress.initially he was a little bit angry when talking about his pain medication but after I discussed with him the information that is in EPIC from his providers in [place], stating that he should not be given  narcotic pain medication he calmed down considerably Skin: no rash Lungs:  clear Cor: regular S1 and S2 no murmurs  Lab Results HIV 1 RNA Quant (copies/mL)  Date Value  09/23/2012 184*  08/26/2012 73*     CD4 T Cell Abs (cmm)  Date Value  09/23/2012 600   08/26/2012 760      Assessment: 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 However her current guidelines suggest that there is potential benefit 2 treating everyone living with HIV infection regardless of CD4 count or viral load. I've reviewed potential options and will start him on Complera. With his viral load being only 184 I will not be able to obtain a pretreatment genotype resistance test.  Plan: 1. Start Complera 2. Followup after lab work in 6 weeks   Cliffton Asters, MD [facility] [phone] pager   [phone] cell 11/21/2012, 3:38 PM

## 2012-11-21 NOTE — ED Notes (Addendum)
Pt from home, Pt c/o 10/10 back pain, Pt states he was seen by PCP for same on Tues. Pt states pain is worse today Denies recent injury or trauma

## 2012-11-21 NOTE — Telephone Encounter (Signed)
Pharmacy states albuterol/ ipratropium needs to be changed to combivent respimat 20/118mcg the alb/iprat/ is no longer available

## 2012-11-21 NOTE — ED Provider Notes (Signed)
History     CSN: [id number]  Arrival date & time 11/21/12  4098   First MD Initiated Contact with Patient 11/21/12 [phone]      Chief Complaint  Patient presents with  . Back Pain    (Consider location/radiation/quality/duration/timing/severity/associated sxs/prior treatment) HPI Comments: 57 yo male presents with chronic lower back pain. States began in 1995 after work accident requiring 4 surgeries in [place]. He has many ED visits for similar pain. He states he has seen an Orthopaedist in [place] as well as pain management at [facility] and he reports that both have refused to prescribe him medicines because he does not have all of his paper work from [place]. He has no neurologic dysfunction. He has normal motor strength and sensation in lower extremities. There is no bowel or bladder dysfunction and he has normal sensation in saddle region. He reports pain is same pain he has had since 1995 and there have been no new traumatic accidents.  Patient is a 57 y.o. male presenting with back pain. The history is provided by the patient. No language interpreter was used.  Back Pain Associated symptoms: no abdominal pain, no chest pain, no dysuria, no fever, no headaches, no numbness and no weakness     Past Medical History  Diagnosis Date  . HIV positive 2004  . Hypertension   . Meningitis   . Coronary artery disease   . Chest pain at rest 09/23/2012  . Chronic bronchitis     "q year last 5 yr or so" (09/24/2012)  . Exertional dyspnea   . Migraines   . Arthritis     "both shoulders" (09/24/2012)  . Chronic lower back pain   . Pneumonia     Past Surgical History  Procedure Laterality Date  . Intussusception repair  10/2011  . Posterior lumbar fusion  1995  . Elbow surgery  ~ 1997    "removed some stones; right" (09/24/2012)  . Appendectomy  2013  . Back surgery    . Abdominal surgery      History reviewed. No pertinent family history.  History  Substance Use Topics  .  Smoking status: Former Smoker -- 0.50 packs/day for 45 years    Types: Cigarettes    Quit date: 12/18/2011  . Smokeless tobacco: Never Used     Comment: 09/24/2012 "been stopped now ~ 8 month"  . Alcohol Use: No      Review of Systems  Constitutional: Negative for fever and chills.  HENT: Negative for sore throat and neck pain.   Eyes: Negative for visual disturbance.  Respiratory: Negative for cough and shortness of breath.   Cardiovascular: Negative for chest pain.  Gastrointestinal: Negative for nausea, vomiting, abdominal pain and diarrhea.  Genitourinary: Negative for dysuria and frequency.  Musculoskeletal: Positive for back pain and gait problem.  Skin: Negative for rash.  Neurological: Negative for weakness, numbness and headaches.  Hematological: Negative for adenopathy.  Psychiatric/Behavioral: Negative for behavioral problems.    Allergies  Cyclobenzaprine; Flexeril; Hydrocodone; Nabumetone; [name]; Tramadol; Bee venom; and Other  Home Medications   Current Outpatient Rx  Name  Route  Sig  Dispense  Refill  . albuterol-ipratropium (COMBIVENT) 18-103 MCG/ACT inhaler   Inhalation   Inhale 2 puffs into the lungs every 6 (six) hours as needed. For shortness of breath   1 Inhaler   6   . Multiple Vitamin (MULITIVITAMIN WITH MINERALS) TABS   Oral   Take 1 tablet by mouth daily.         Marland Kitchen  ramipril (ALTACE) 1.25 MG tablet   Oral   Take 1 tablet (1.25 mg total) by mouth 2 (two) times daily.   60 tablet   6   . Emtricitab-Rilpivir-Tenofovir 200-25-300 MG TABS   Oral   Take 1 tablet by mouth daily.   30 tablet   11     BP 145/109  Pulse 79  Temp(Src) 97.6 F (36.4 C) (Oral)  Resp 20  SpO2 95%  Physical Exam  Nursing note and vitals reviewed. Constitutional: He appears well-developed and well-nourished. No distress.  Well appearing male in no distress with normal vitals.  HENT:  Head: Normocephalic and atraumatic.  Mouth/Throat: Oropharynx is  clear and moist. No oropharyngeal exudate.  Eyes: Conjunctivae and EOM are normal. Pupils are equal, round, and reactive to light. Right eye exhibits no discharge. Left eye exhibits no discharge. No scleral icterus.  Neck: Normal range of motion. Neck supple. No JVD present. No thyromegaly present.  Cardiovascular: Normal rate, regular rhythm, normal heart sounds and intact distal pulses.  Exam reveals no gallop and no friction rub.   No murmur heard. Pulmonary/Chest: Effort normal and breath sounds normal. No respiratory distress. He has no wheezes. He has no rales.  Abdominal: Soft. Bowel sounds are normal. He exhibits no distension and no mass. There is no tenderness.  Musculoskeletal: Normal range of motion. He exhibits no edema and no tenderness.  Lymphadenopathy:    He has no cervical adenopathy.  Neurological: He is alert. He displays normal reflexes. He exhibits normal muscle tone. Coordination normal.  Motor 5/5 in lower extremities Sensation normal in lower extremities DTR 2+ at patella and achilles bilaterally  Skin: Skin is warm and dry. No rash noted. No erythema.  Psychiatric: He has a normal mood and affect. His behavior is normal.    ED Course  Procedures (including critical care time)  Labs Reviewed - No data to display No results found.   1. Back pain       MDM  57 yo male presents with chronic low back pain. He has no red flag signs on exam or by history. He is trying to see an Orthopaedist as well as pain management. DDx: chronic low back pain. We will treat pain here and have him follow up with aforementioned. Tylenol at home.  The pt has had multiple visits for pain in the last 6 months, he frequently gets Rx for opiates, has no abnormal VS or pathological signs on exam - stable for d/c.        Vida Roller, MD 11/22/12 [phone]

## 2012-11-24 ENCOUNTER — Encounter (HOSPITAL_COMMUNITY): Payer: Self-pay | Admitting: Emergency Medicine

## 2012-11-24 ENCOUNTER — Emergency Department (HOSPITAL_COMMUNITY): Payer: Medicare Other

## 2012-11-24 ENCOUNTER — Emergency Department (HOSPITAL_COMMUNITY)
Admission: EM | Admit: 2012-11-24 | Discharge: 2012-11-24 | Disposition: A | Discharge status: Home / Self Care | Payer: Medicare Other | Attending: Emergency Medicine | Admitting: Emergency Medicine

## 2012-11-24 DIAGNOSIS — S20212A Contusion of left front wall of thorax, initial encounter: Secondary | ICD-10-CM

## 2012-11-24 DIAGNOSIS — S20219A Contusion of unspecified front wall of thorax, initial encounter: Secondary | ICD-10-CM | POA: Insufficient documentation

## 2012-11-24 DIAGNOSIS — S50312A Abrasion of left elbow, initial encounter: Secondary | ICD-10-CM

## 2012-11-24 DIAGNOSIS — Z87891 Personal history of nicotine dependence: Secondary | ICD-10-CM | POA: Insufficient documentation

## 2012-11-24 DIAGNOSIS — Z79899 Other long term (current) drug therapy: Secondary | ICD-10-CM | POA: Insufficient documentation

## 2012-11-24 DIAGNOSIS — I2510 Atherosclerotic heart disease of native coronary artery without angina pectoris: Secondary | ICD-10-CM | POA: Insufficient documentation

## 2012-11-24 DIAGNOSIS — Z8661 Personal history of infections of the central nervous system: Secondary | ICD-10-CM | POA: Insufficient documentation

## 2012-11-24 DIAGNOSIS — Z9889 Other specified postprocedural states: Secondary | ICD-10-CM | POA: Insufficient documentation

## 2012-11-24 DIAGNOSIS — Z8709 Personal history of other diseases of the respiratory system: Secondary | ICD-10-CM | POA: Insufficient documentation

## 2012-11-24 DIAGNOSIS — Z8739 Personal history of other diseases of the musculoskeletal system and connective tissue: Secondary | ICD-10-CM | POA: Insufficient documentation

## 2012-11-24 DIAGNOSIS — S0990XA Unspecified injury of head, initial encounter: Secondary | ICD-10-CM | POA: Insufficient documentation

## 2012-11-24 DIAGNOSIS — S0003XA Contusion of scalp, initial encounter: Secondary | ICD-10-CM | POA: Insufficient documentation

## 2012-11-24 DIAGNOSIS — S40011A Contusion of right shoulder, initial encounter: Secondary | ICD-10-CM

## 2012-11-24 DIAGNOSIS — S40019A Contusion of unspecified shoulder, initial encounter: Secondary | ICD-10-CM | POA: Insufficient documentation

## 2012-11-24 DIAGNOSIS — G8929 Other chronic pain: Secondary | ICD-10-CM | POA: Insufficient documentation

## 2012-11-24 DIAGNOSIS — Z8669 Personal history of other diseases of the nervous system and sense organs: Secondary | ICD-10-CM | POA: Insufficient documentation

## 2012-11-24 DIAGNOSIS — IMO0002 Reserved for concepts with insufficient information to code with codable children: Secondary | ICD-10-CM | POA: Insufficient documentation

## 2012-11-24 DIAGNOSIS — Z8701 Personal history of pneumonia (recurrent): Secondary | ICD-10-CM | POA: Insufficient documentation

## 2012-11-24 DIAGNOSIS — Z8679 Personal history of other diseases of the circulatory system: Secondary | ICD-10-CM | POA: Insufficient documentation

## 2012-11-24 DIAGNOSIS — Z21 Asymptomatic human immunodeficiency virus [HIV] infection status: Secondary | ICD-10-CM | POA: Insufficient documentation

## 2012-11-24 DIAGNOSIS — I10 Essential (primary) hypertension: Secondary | ICD-10-CM | POA: Insufficient documentation

## 2012-11-24 LAB — BASIC METABOLIC PANEL
BUN: 15 mg/dL (ref 6–23)
CO2: 24 mEq/L (ref 19–32)
Calcium: 9.4 mg/dL (ref 8.4–10.5)
Chloride: 105 mEq/L (ref 96–112)
Creatinine, Ser: 0.82 mg/dL (ref 0.50–1.35)
GFR calc Af Amer: >90 mL/min (ref >90)
GFR calc non Af Amer: >90 mL/min (ref >90)
Glucose, Bld: 95 mg/dL (ref 70–99)
Potassium: 4 mEq/L (ref 3.5–5.1)
Sodium: 138 mEq/L (ref 135–145)

## 2012-11-24 LAB — CBC
HCT: 43.3 % (ref 39.0–52.0)
Hemoglobin: 14.4 g/dL (ref 13.0–17.0)
MCH: 27.6 pg (ref 26.0–34.0)
MCHC: 33.3 g/dL (ref 30.0–36.0)
MCV: 83 fL (ref 78.0–100.0)
Platelets: 317 10*3/uL (ref 150–400)
RBC: 5.22 MIL/uL (ref 4.22–5.81)
RDW: 15.5 % (ref 11.5–15.5)
WBC: 7.8 10*3/uL (ref 4.0–10.5)

## 2012-11-24 LAB — POCT I-STAT TROPONIN I: Troponin i, poc: 0 ng/mL (ref 0.00–0.08)

## 2012-11-24 LAB — PRO B NATRIURETIC PEPTIDE: Pro B Natriuretic peptide (BNP): 140.6 pg/mL — ABNORMAL HIGH (ref 0–125)

## 2012-11-24 MED ORDER — OXYCODONE-ACETAMINOPHEN 5-325 MG PO TABS: 1.0000 | ORAL_TABLET | ORAL | Status: DC | PRN

## 2012-11-24 MED ORDER — OXYCODONE-ACETAMINOPHEN 5-325 MG PO TABS
1.0000 | ORAL_TABLET | Freq: Once | ORAL | Status: AC
  Administered 2012-11-24: 1 via ORAL
  Filled 2012-11-24: qty 1

## 2012-11-24 NOTE — ED Notes (Signed)
Pt back from CT

## 2012-11-24 NOTE — ED Notes (Signed)
Pt states he has been having chest pain x 30 mins.  States hx of MI, htn.  Describes it as a stabbing pain.

## 2012-11-24 NOTE — ED Notes (Signed)
Pt alert and oriented x4. Respirations even and unlabored, bilateral symmetrical rise and fall of chest. Skin warm and dry. In no acute distress. Denies needs.   

## 2012-11-24 NOTE — ED Notes (Signed)
Pt to CT
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
## 2012-11-24 NOTE — ED Notes (Signed)
Pt back from x-ray.
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
## 2012-11-24 NOTE — ED Notes (Signed)
[employer] at bedside taking a report

## 2012-11-25 LAB — BENZODIAZEPINES (GC/LC/MS), URINE
Alprazolam (GC/LC/MS), ur confirm: NEGATIVE ng/mL
Alprazolam metabolite (GC/LC/MS), ur confirm: NEGATIVE ng/mL
Clonazepam metabolite (GC/LC/MS), ur confirm: NEGATIVE ng/mL
Diazepam (GC/LC/MS), ur confirm: NEGATIVE ng/mL
Estazolam (GC/LC/MS), ur confirm: NEGATIVE ng/mL
Flunitrazepam metabolite (GC/LC/MS), ur confirm: NEGATIVE ng/mL
Flurazepam metabolite (GC/LC/MS), ur confirm: NEGATIVE ng/mL
Lorazepam (GC/LC/MS), ur confirm: NEGATIVE ng/mL
Midazolam (GC/LC/MS), ur confirm: NEGATIVE ng/mL
Nordiazepam (GC/LC/MS), ur confirm: 931 ng/mL
Oxazepam (GC/LC/MS), ur confirm: 2092 ng/mL
Temazepam (GC/LC/MS), ur confirm: 2150 ng/mL
Triazolam metabolite (GC/LC/MS), ur confirm: NEGATIVE ng/mL

## 2012-11-25 LAB — OPIATES/OPIOIDS (LC/MS-MS)
Codeine Urine: NEGATIVE ng/mL
Heroin (6-AM), UR: NEGATIVE ng/mL
Hydrocodone: NEGATIVE ng/mL
Hydromorphone: NEGATIVE ng/mL
Morphine Urine: NEGATIVE ng/mL
Norhydrocodone, Ur: 113 ng/mL
Noroxycodone, Ur: 1011 ng/mL
Oxycodone, ur: 358 ng/mL
Oxymorphone: 354 ng/mL

## 2012-11-25 LAB — OXYCODONE, URINE (LC/MS-MS)
Noroxycodone, Ur: 1011 ng/mL
Oxycodone, ur: 358 ng/mL
Oxymorphone: 354 ng/mL

## 2012-11-26 ENCOUNTER — Other Ambulatory Visit: Payer: Self-pay | Admitting: *Deleted

## 2012-11-26 MED ORDER — IPRATROPIUM-ALBUTEROL 18-103 MCG/ACT IN AERO: 2.0000 | INHALATION_SPRAY | Freq: Four times a day (QID) | RESPIRATORY_TRACT | Status: DC | PRN

## 2012-11-26 NOTE — Telephone Encounter (Signed)
This medication is no longer available.  Can you change to combivent respimat 20-140mcg?

## 2012-11-27 ENCOUNTER — Encounter: Payer: Self-pay | Admitting: Internal Medicine

## 2012-11-27 MED ORDER — IPRATROPIUM-ALBUTEROL 20-100 MCG/ACT IN AERS: 1.0000 | INHALATION_SPRAY | Freq: Four times a day (QID) | RESPIRATORY_TRACT | Status: DC

## 2012-11-29 ENCOUNTER — Encounter: Payer: Self-pay | Admitting: *Deleted

## 2012-11-29 ENCOUNTER — Encounter: Payer: Self-pay | Admitting: Internal Medicine

## 2012-11-29 ENCOUNTER — Ambulatory Visit (INDEPENDENT_AMBULATORY_CARE_PROVIDER_SITE_OTHER): Payer: Medicare Other | Admitting: Internal Medicine

## 2012-11-29 ENCOUNTER — Other Ambulatory Visit: Payer: Self-pay | Admitting: *Deleted

## 2012-11-29 DIAGNOSIS — M25511 Pain in right shoulder: Secondary | ICD-10-CM | POA: Insufficient documentation

## 2012-11-29 DIAGNOSIS — M25519 Pain in unspecified shoulder: Secondary | ICD-10-CM

## 2012-11-29 MED ORDER — MELOXICAM 7.5 MG PO TABS
7.5000 mg | ORAL_TABLET | Freq: Every day | ORAL | Status: DC
Start: 2012-11-29 — End: 2012-12-02

## 2012-11-29 MED ORDER — LANSOPRAZOLE 15 MG PO CPDR: 15.0000 mg | DELAYED_RELEASE_CAPSULE | Freq: Every day | ORAL | Status: DC
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
## 2012-11-29 NOTE — Progress Notes (Signed)
Agree with evaluation today.  

## 2012-11-29 NOTE — Patient Instructions (Signed)
1. Start Meloxicam (Mobic) 7.5 mg tablets.  You can take 1-2 tablets daily for your pain.    2.  While you are taking the Mobic please take Lansoprazole to help protect your stomach.  It is over the counter and you can take 1 capsule daily.  3.  Using heating pads or ice packs.  Which ever feels better, for 20 minutes at a time several times daily will help with your pain as well.  4.  Make sure you keep moving the shoulder as much as you can to prevent frozen shoulder.  5.  Follow up in 3-4 weeks to see how you are doing.

## 2012-12-02 ENCOUNTER — Encounter (HOSPITAL_COMMUNITY): Payer: Self-pay | Admitting: Family Medicine

## 2012-12-02 ENCOUNTER — Ambulatory Visit: Payer: Medicare Other | Admitting: Internal Medicine

## 2012-12-02 ENCOUNTER — Emergency Department (HOSPITAL_COMMUNITY)
Admission: EM | Admit: 2012-12-02 | Discharge: 2012-12-03 | Disposition: A | Discharge status: Home / Self Care | Payer: Medicare Other | Attending: Emergency Medicine | Admitting: Emergency Medicine

## 2012-12-02 DIAGNOSIS — Z87828 Personal history of other (healed) physical injury and trauma: Secondary | ICD-10-CM | POA: Insufficient documentation

## 2012-12-02 DIAGNOSIS — Z8701 Personal history of pneumonia (recurrent): Secondary | ICD-10-CM | POA: Insufficient documentation

## 2012-12-02 DIAGNOSIS — Z981 Arthrodesis status: Secondary | ICD-10-CM | POA: Insufficient documentation

## 2012-12-02 DIAGNOSIS — M545 Low back pain, unspecified: Secondary | ICD-10-CM | POA: Insufficient documentation

## 2012-12-02 DIAGNOSIS — Z8739 Personal history of other diseases of the musculoskeletal system and connective tissue: Secondary | ICD-10-CM | POA: Insufficient documentation

## 2012-12-02 DIAGNOSIS — Z8679 Personal history of other diseases of the circulatory system: Secondary | ICD-10-CM | POA: Insufficient documentation

## 2012-12-02 DIAGNOSIS — I10 Essential (primary) hypertension: Secondary | ICD-10-CM | POA: Insufficient documentation

## 2012-12-02 DIAGNOSIS — Z8661 Personal history of infections of the central nervous system: Secondary | ICD-10-CM | POA: Insufficient documentation

## 2012-12-02 DIAGNOSIS — Z79899 Other long term (current) drug therapy: Secondary | ICD-10-CM | POA: Insufficient documentation

## 2012-12-02 DIAGNOSIS — Z9089 Acquired absence of other organs: Secondary | ICD-10-CM | POA: Insufficient documentation

## 2012-12-02 DIAGNOSIS — Z9889 Other specified postprocedural states: Secondary | ICD-10-CM | POA: Insufficient documentation

## 2012-12-02 DIAGNOSIS — I2510 Atherosclerotic heart disease of native coronary artery without angina pectoris: Secondary | ICD-10-CM | POA: Insufficient documentation

## 2012-12-02 DIAGNOSIS — R197 Diarrhea, unspecified: Secondary | ICD-10-CM | POA: Insufficient documentation

## 2012-12-02 DIAGNOSIS — Z87891 Personal history of nicotine dependence: Secondary | ICD-10-CM | POA: Insufficient documentation

## 2012-12-02 DIAGNOSIS — M25559 Pain in unspecified hip: Secondary | ICD-10-CM | POA: Insufficient documentation

## 2012-12-02 DIAGNOSIS — Z8709 Personal history of other diseases of the respiratory system: Secondary | ICD-10-CM | POA: Insufficient documentation

## 2012-12-02 DIAGNOSIS — Z21 Asymptomatic human immunodeficiency virus [HIV] infection status: Secondary | ICD-10-CM | POA: Insufficient documentation

## 2012-12-02 DIAGNOSIS — G8929 Other chronic pain: Secondary | ICD-10-CM | POA: Insufficient documentation

## 2012-12-02 MED ORDER — DIPHENHYDRAMINE HCL 50 MG/ML IJ SOLN
25.0000 mg | Freq: Once | INTRAMUSCULAR | Status: AC
  Administered 2012-12-02: 25 mg via INTRAVENOUS
  Filled 2012-12-02: qty 1

## 2012-12-02 MED ORDER — HYDROMORPHONE HCL PF 1 MG/ML IJ SOLN
1.0000 mg | Freq: Once | INTRAMUSCULAR | Status: AC
  Administered 2012-12-02: 1 mg via INTRAVENOUS
  Filled 2012-12-02: qty 1

## 2012-12-02 MED ORDER — SODIUM CHLORIDE 0.9 % IV BOLUS (SEPSIS)
1000.0000 mL | Freq: Once | INTRAVENOUS | Status: AC
  Administered 2012-12-02: 1000 mL via INTRAVENOUS

## 2012-12-02 MED ORDER — ONDANSETRON HCL 4 MG/2ML IJ SOLN
4.0000 mg | Freq: Once | INTRAMUSCULAR | Status: AC
  Administered 2012-12-02: 4 mg via INTRAVENOUS
  Filled 2012-12-02: qty 2

## 2012-12-02 MED ORDER — OXYCODONE-ACETAMINOPHEN 5-325 MG PO TABS: 1.0000 | ORAL_TABLET | ORAL | Status: DC | PRN

## 2012-12-02 NOTE — ED Notes (Signed)
Pt able to ambulate with no difficulty.  Theron Arista, PA made aware.

## 2012-12-02 NOTE — ED Notes (Signed)
[id number]<AV> Expected date:<BR> Expected time:<BR> Means of arrival:<BR> Comments:<BR> TR 4

## 2012-12-02 NOTE — ED Provider Notes (Signed)
Medical screening examination/treatment/procedure(s) were performed by non-physician practitioner and as supervising physician I was immediately available for consultation/collaboration.  Flint Melter, MD 12/02/12 [phone]

## 2012-12-02 NOTE — ED Notes (Addendum)
Patient states that he has had back and hip pain "for years." States that he was in the process of having back surgery but he is waiting to get records from [place]. Has not taken any meds for pain. Reports that for the past 2-3 days he has been having more frequent bowel movements.
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
## 2012-12-08 ENCOUNTER — Emergency Department (HOSPITAL_COMMUNITY): Payer: Medicare Other

## 2012-12-08 ENCOUNTER — Emergency Department (HOSPITAL_COMMUNITY)
Admission: EM | Admit: 2012-12-08 | Discharge: 2012-12-08 | Disposition: A | Discharge status: Home / Self Care | Payer: Medicare Other | Attending: Emergency Medicine | Admitting: Emergency Medicine

## 2012-12-08 ENCOUNTER — Encounter (HOSPITAL_COMMUNITY): Payer: Self-pay | Admitting: Emergency Medicine

## 2012-12-08 DIAGNOSIS — Z21 Asymptomatic human immunodeficiency virus [HIV] infection status: Secondary | ICD-10-CM | POA: Insufficient documentation

## 2012-12-08 DIAGNOSIS — Z87891 Personal history of nicotine dependence: Secondary | ICD-10-CM | POA: Insufficient documentation

## 2012-12-08 DIAGNOSIS — Z8679 Personal history of other diseases of the circulatory system: Secondary | ICD-10-CM | POA: Insufficient documentation

## 2012-12-08 DIAGNOSIS — I10 Essential (primary) hypertension: Secondary | ICD-10-CM | POA: Insufficient documentation

## 2012-12-08 DIAGNOSIS — M545 Low back pain, unspecified: Secondary | ICD-10-CM | POA: Insufficient documentation

## 2012-12-08 DIAGNOSIS — Z8661 Personal history of infections of the central nervous system: Secondary | ICD-10-CM | POA: Insufficient documentation

## 2012-12-08 DIAGNOSIS — Z8709 Personal history of other diseases of the respiratory system: Secondary | ICD-10-CM | POA: Insufficient documentation

## 2012-12-08 DIAGNOSIS — Z79899 Other long term (current) drug therapy: Secondary | ICD-10-CM | POA: Insufficient documentation

## 2012-12-08 DIAGNOSIS — M25519 Pain in unspecified shoulder: Secondary | ICD-10-CM | POA: Insufficient documentation

## 2012-12-08 DIAGNOSIS — M25511 Pain in right shoulder: Secondary | ICD-10-CM

## 2012-12-08 DIAGNOSIS — Z8739 Personal history of other diseases of the musculoskeletal system and connective tissue: Secondary | ICD-10-CM | POA: Insufficient documentation

## 2012-12-08 DIAGNOSIS — Z8701 Personal history of pneumonia (recurrent): Secondary | ICD-10-CM | POA: Insufficient documentation

## 2012-12-08 DIAGNOSIS — G8929 Other chronic pain: Secondary | ICD-10-CM | POA: Insufficient documentation

## 2012-12-08 DIAGNOSIS — I2510 Atherosclerotic heart disease of native coronary artery without angina pectoris: Secondary | ICD-10-CM | POA: Insufficient documentation

## 2012-12-08 MED ORDER — OXYCODONE-ACETAMINOPHEN 5-325 MG PO TABS: 1.0000 | ORAL_TABLET | Freq: Four times a day (QID) | ORAL | Status: DC | PRN
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
## 2012-12-11 ENCOUNTER — Telehealth: Payer: Self-pay | Admitting: *Deleted

## 2012-12-11 NOTE — Telephone Encounter (Signed)
Pt scheduled for tomorrow

## 2012-12-11 NOTE — Telephone Encounter (Signed)
Pt called with c/o heart skipping a beats, can't catch his breath and sharp pain under left rib cage.  Pain last about 15 - 20 minutes. On and off every day for the past 3 - 4 weeks.   He gets  hot and feels faint.  He went to ED for same on 3/23 but was d/c. Denies nausea, vomiting. Hx: CAD/Reflux  Pt # [id number]  We have openings tomorrow or should he go to ED again.
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
## 2012-12-11 NOTE — ED Provider Notes (Signed)
  Medical screening examination/treatment/procedure(s) were performed by non-physician practitioner and as supervising physician I was immediately available for consultation/collaboration.    Gerhard Munch, MD 12/11/12 1544

## 2012-12-12 ENCOUNTER — Encounter: Payer: Medicare Other | Admitting: Internal Medicine

## 2012-12-13 ENCOUNTER — Ambulatory Visit (INDEPENDENT_AMBULATORY_CARE_PROVIDER_SITE_OTHER): Payer: Medicare Other | Admitting: Internal Medicine

## 2012-12-13 ENCOUNTER — Encounter: Payer: Self-pay | Admitting: Internal Medicine

## 2012-12-13 VITALS — BP 152/109 | HR 76 | Temp 97.0°F | Wt 164.2 lb

## 2012-12-13 DIAGNOSIS — M25519 Pain in unspecified shoulder: Secondary | ICD-10-CM

## 2012-12-13 DIAGNOSIS — M25511 Pain in right shoulder: Secondary | ICD-10-CM

## 2012-12-13 MED ORDER — BACLOFEN 10 MG PO TABS: 10.0000 mg | ORAL_TABLET | Freq: Three times a day (TID) | ORAL | Status: DC

## 2012-12-13 NOTE — Patient Instructions (Signed)
We will give you a muscle relaxer to help with your shoulder. You can take 1 pill up to 3 times a day as needed for pain.  We will give you some stretches as well to help with your mobility.   Go and see the orthopedic doctor when you can.  Our number is [phone]. Come back as needed.   Shoulder Exercises EXERCISES  RANGE OF MOTION (ROM) AND STRETCHING EXERCISES These exercises may help you when beginning to rehabilitate your injury. Your symptoms may resolve with or without further involvement from your physician, physical therapist or athletic trainer. While completing these exercises, remember:   Restoring tissue flexibility helps normal motion to return to the joints. This allows healthier, less painful movement and activity.  An effective stretch should be held for at least 30 seconds.  A stretch should never be painful. You should only feel a gentle lengthening or release in the stretched tissue. ROM - Pendulum  Bend at the waist so that your right / left arm falls away from your body. Support yourself with your opposite hand on a solid surface, such as a table or a countertop.  Your right / left arm should be perpendicular to the ground. If it is not perpendicular, you need to lean over farther. Relax the muscles in your right / left arm and shoulder as much as possible.  Gently sway your hips and trunk so they move your right / left arm without any use of your right / left shoulder muscles.  Progress your movements so that your right / left arm moves side to side, then forward and backward, and finally, both clockwise and counterclockwise.  Complete __________ repetitions in each direction. Many people use this exercise to relieve discomfort in their shoulder as well as to gain range of motion. Repeat __________ times. Complete this exercise __________ times per day. STRETCH  Flexion, Standing  Stand with good posture. With an underhand grip on your right / left hand and  an overhand grip on the opposite hand, grasp a broomstick or cane so that your hands are a little more than shoulder-width apart.  Keeping your right / left elbow straight and shoulder muscles relaxed, push the stick with your opposite hand to raise your right / left arm in front of your body and then overhead. Raise your arm until you feel a stretch in your right / left shoulder, but before you have increased shoulder pain.  Try to avoid shrugging your right / left shoulder as your arm rises by keeping your shoulder blade tucked down and toward your mid-back spine. Hold __________ seconds.  Slowly return to the starting position. Repeat __________ times. Complete this exercise __________ times per day. STRETCH - Internal Rotation  Place your right / left hand behind your back, palm-up.  Throw a towel or belt over your opposite shoulder. Grasp the towel/belt with your right / left hand.  While keeping an upright posture, gently pull up on the towel/belt until you feel a stretch in the front of your right / left shoulder.  Avoid shrugging your right / left shoulder as your arm rises by keeping your shoulder blade tucked down and toward your mid-back spine.  Hold __________. Release the stretch by lowering your opposite hand. Repeat __________ times. Complete this exercise __________ times per day. STRETCH - External Rotation and Abduction  Stagger your stance through a doorframe. It does not matter which foot is forward.  As instructed by your physician, physical therapist  or athletic trainer, place your hands:  And forearms above your head and on the door frame.  And forearms at head-height and on the door frame.  At elbow-height and on the door frame.  Keeping your head and chest upright and your stomach muscles tight to prevent over-extending your low-back, slowly shift your weight onto your front foot until you feel a stretch across your chest and/or in the front of your  shoulders.  Hold __________ seconds. Shift your weight to your back foot to release the stretch. Repeat __________ times. Complete this stretch __________ times per day.  STRENGTHENING EXERCISES  These exercises may help you when beginning to rehabilitate your injury. They may resolve your symptoms with or without further involvement from your physician, physical therapist or athletic trainer. While completing these exercises, remember:   Muscles can gain both the endurance and the strength needed for everyday activities through controlled exercises.  Complete these exercises as instructed by your physician, physical therapist or athletic trainer. Progress the resistance and repetitions only as guided.  You may experience muscle soreness or fatigue, but the pain or discomfort you are trying to eliminate should never worsen during these exercises. If this pain does worsen, stop and make certain you are following the directions exactly. If the pain is still present after adjustments, discontinue the exercise until you can discuss the trouble with your clinician.  If advised by your physician, during your recovery, avoid activity or exercises which involve actions that place your right / left hand or elbow above your head or behind your back or head. These positions stress the tissues which are trying to heal. STRENGTH - Scapular Depression and Adduction  With good posture, sit on a firm chair. Supported your arms in front of you with pillows, arm rests or a table top. Have your elbows in line with the sides of your body.  Gently draw your shoulder blades down and toward your mid-back spine. Gradually increase the tension without tensing the muscles along the top of your shoulders and the back of your neck.  Hold for __________ seconds. Slowly release the tension and relax your muscles completely before completing the next repetition.  After you have practiced this exercise, remove the arm support  and complete it in standing as well as sitting. Repeat __________ times. Complete this exercise __________ times per day.  STRENGTH - External Rotators  Secure a rubber exercise band/tubing to a fixed object so that it is at the same height as your right / left elbow when you are standing or sitting on a firm surface.  Stand or sit so that the secured exercise band/tubing is at your side that is not injured.  Bend your elbow 90 degrees. Place a folded towel or small pillow under your right / left arm so that your elbow is a few inches away from your side.  Keeping the tension on the exercise band/tubing, pull it away from your body, as if pivoting on your elbow. Be sure to keep your body steady so that the movement is only coming from your shoulder rotating.  Hold __________ seconds. Release the tension in a controlled manner as you return to the starting position. Repeat __________ times. Complete this exercise __________ times per day.  STRENGTH - Supraspinatus  Stand or sit with good posture. Grasp a __________ weight or an exercise band/tubing so that your hand is "thumbs-up," like when you shake hands.  Slowly lift your right / left hand from your thigh  into the air, traveling about 30 degrees from straight out at your side. Lift your hand to shoulder height or as far as you can without increasing any shoulder pain. Initially, many people do not lift their hands above shoulder height.  Avoid shrugging your right / left shoulder as your arm rises by keeping your shoulder blade tucked down and toward your mid-back spine.  Hold for __________ seconds. Control the descent of your hand as you slowly return to your starting position. Repeat __________ times. Complete this exercise __________ times per day.  STRENGTH - Shoulder Extensors  Secure a rubber exercise band/tubing so that it is at the height of your shoulders when you are either standing or sitting on a firm arm-less chair.  With  a thumbs-up grip, grasp an end of the band/tubing in each hand. Straighten your elbows and lift your hands straight in front of you at shoulder height. Step back away from the secured end of band/tubing until it becomes tense.  Squeezing your shoulder blades together, pull your hands down to the sides of your thighs. Do not allow your hands to go behind you.  Hold for __________ seconds. Slowly ease the tension on the band/tubing as you reverse the directions and return to the starting position. Repeat __________ times. Complete this exercise __________ times per day.  STRENGTH - Scapular Retractors  Secure a rubber exercise band/tubing so that it is at the height of your shoulders when you are either standing or sitting on a firm arm-less chair.  With a palm-down grip, grasp an end of the band/tubing in each hand. Straighten your elbows and lift your hands straight in front of you at shoulder height. Step back away from the secured end of band/tubing until it becomes tense.  Squeezing your shoulder blades together, draw your elbows back as you bend them. Keep your upper arm lifted away from your body throughout the exercise.  Hold __________ seconds. Slowly ease the tension on the band/tubing as you reverse the directions and return to the starting position. Repeat __________ times. Complete this exercise __________ times per day. STRENGTH  Scapular Depressors  Find a sturdy chair without wheels, such as a from a dining room table.  Keeping your feet on the floor, lift your bottom from the seat and lock your elbows.  Keeping your elbows straight, allow gravity to pull your body weight down. Your shoulders will rise toward your ears.  Raise your body against gravity by drawing your shoulder blades down your back, shortening the distance between your shoulders and ears. Although your feet should always maintain contact with the floor, your feet should progressively support less body weight as  you get stronger.  Hold __________ seconds. In a controlled and slow manner, lower your body weight to begin the next repetition. Repeat __________ times. Complete this exercise __________ times per day.  Document Released: 07/19/2005 Document Revised: 11/27/2011 Document Reviewed: 12/17/2008 [facility] Patient Information 2013 [place], [place].
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
## 2012-12-14 ENCOUNTER — Other Ambulatory Visit: Payer: Self-pay

## 2012-12-14 ENCOUNTER — Inpatient Hospital Stay (HOSPITAL_COMMUNITY)
Admission: EM | Admit: 2012-12-14 | Discharge: 2012-12-16 | DRG: 392 | Disposition: A | Discharge status: Home / Self Care | Payer: Medicare Other | Attending: Internal Medicine | Admitting: Internal Medicine

## 2012-12-14 ENCOUNTER — Encounter (HOSPITAL_COMMUNITY): Payer: Self-pay | Admitting: Neurology

## 2012-12-14 ENCOUNTER — Emergency Department (HOSPITAL_COMMUNITY): Payer: Medicare Other

## 2012-12-14 ENCOUNTER — Inpatient Hospital Stay (HOSPITAL_COMMUNITY): Payer: Medicare Other

## 2012-12-14 DIAGNOSIS — K219 Gastro-esophageal reflux disease without esophagitis: Principal | ICD-10-CM | POA: Diagnosis present

## 2012-12-14 DIAGNOSIS — I2510 Atherosclerotic heart disease of native coronary artery without angina pectoris: Secondary | ICD-10-CM | POA: Diagnosis present

## 2012-12-14 DIAGNOSIS — R1012 Left upper quadrant pain: Secondary | ICD-10-CM

## 2012-12-14 DIAGNOSIS — J439 Emphysema, unspecified: Secondary | ICD-10-CM | POA: Diagnosis present

## 2012-12-14 DIAGNOSIS — F1410 Cocaine abuse, uncomplicated: Secondary | ICD-10-CM | POA: Diagnosis present

## 2012-12-14 DIAGNOSIS — F17200 Nicotine dependence, unspecified, uncomplicated: Secondary | ICD-10-CM | POA: Diagnosis present

## 2012-12-14 DIAGNOSIS — M940 Chondrocostal junction syndrome [Tietze]: Secondary | ICD-10-CM | POA: Diagnosis present

## 2012-12-14 DIAGNOSIS — L02811 Cutaneous abscess of head [any part, except face]: Secondary | ICD-10-CM

## 2012-12-14 DIAGNOSIS — J438 Other emphysema: Secondary | ICD-10-CM | POA: Diagnosis present

## 2012-12-14 DIAGNOSIS — R9431 Abnormal electrocardiogram [ECG] [EKG]: Secondary | ICD-10-CM

## 2012-12-14 DIAGNOSIS — I509 Heart failure, unspecified: Secondary | ICD-10-CM | POA: Diagnosis present

## 2012-12-14 DIAGNOSIS — R51 Headache: Secondary | ICD-10-CM

## 2012-12-14 DIAGNOSIS — R079 Chest pain, unspecified: Secondary | ICD-10-CM

## 2012-12-14 DIAGNOSIS — L723 Sebaceous cyst: Secondary | ICD-10-CM | POA: Diagnosis present

## 2012-12-14 DIAGNOSIS — E785 Hyperlipidemia, unspecified: Secondary | ICD-10-CM | POA: Diagnosis present

## 2012-12-14 DIAGNOSIS — Z981 Arthrodesis status: Secondary | ICD-10-CM

## 2012-12-14 DIAGNOSIS — M25519 Pain in unspecified shoulder: Secondary | ICD-10-CM | POA: Diagnosis present

## 2012-12-14 DIAGNOSIS — I10 Essential (primary) hypertension: Secondary | ICD-10-CM

## 2012-12-14 DIAGNOSIS — Z21 Asymptomatic human immunodeficiency virus [HIV] infection status: Secondary | ICD-10-CM | POA: Diagnosis present

## 2012-12-14 LAB — APTT: aPTT: 33 seconds (ref 24–37)

## 2012-12-14 LAB — TROPONIN I
Troponin I: <0.3 ng/mL (ref <0.30)
Troponin I: <0.3 ng/mL (ref <0.30)

## 2012-12-14 LAB — COMPREHENSIVE METABOLIC PANEL
ALT: 8 U/L (ref 0–53)
AST: 13 U/L (ref 0–37)
Albumin: 3.5 g/dL (ref 3.5–5.2)
Alkaline Phosphatase: 101 U/L (ref 39–117)
BUN: 15 mg/dL (ref 6–23)
CO2: 27 mEq/L (ref 19–32)
Calcium: 9.3 mg/dL (ref 8.4–10.5)
Chloride: 103 mEq/L (ref 96–112)
Creatinine, Ser: 0.82 mg/dL (ref 0.50–1.35)
GFR calc Af Amer: >90 mL/min (ref >90)
GFR calc non Af Amer: >90 mL/min (ref >90)
Glucose, Bld: 94 mg/dL (ref 70–99)
Potassium: 4.2 mEq/L (ref 3.5–5.1)
Sodium: 136 mEq/L (ref 135–145)
Total Bilirubin: 0.4 mg/dL (ref 0.3–1.2)
Total Protein: 8.8 g/dL — ABNORMAL HIGH (ref 6.0–8.3)

## 2012-12-14 LAB — RAPID URINE DRUG SCREEN, HOSP PERFORMED
Amphetamines: NOT DETECTED
Barbiturates: NOT DETECTED
Benzodiazepines: POSITIVE — AB
Cocaine: NOT DETECTED
Opiates: NOT DETECTED
Tetrahydrocannabinol: NOT DETECTED

## 2012-12-14 LAB — POCT I-STAT, CHEM 8
BUN: 18 mg/dL (ref 6–23)
Calcium, Ion: 1.19 mmol/L (ref 1.12–1.23)
Chloride: 107 mEq/L (ref 96–112)
Creatinine, Ser: 0.7 mg/dL (ref 0.50–1.35)
Glucose, Bld: 93 mg/dL (ref 70–99)
HCT: 50 % (ref 39.0–52.0)
Hemoglobin: 17 g/dL (ref 13.0–17.0)
Potassium: 3.8 mEq/L (ref 3.5–5.1)
Sodium: 142 mEq/L (ref 135–145)
TCO2: 24 mmol/L (ref 0–100)

## 2012-12-14 LAB — URINALYSIS, ROUTINE W REFLEX MICROSCOPIC
Bilirubin Urine: NEGATIVE
Glucose, UA: NEGATIVE mg/dL
Hgb urine dipstick: NEGATIVE
Ketones, ur: NEGATIVE mg/dL
Leukocytes, UA: NEGATIVE
Nitrite: NEGATIVE
Protein, ur: NEGATIVE mg/dL
Specific Gravity, Urine: 1.023 (ref 1.005–1.030)
Urobilinogen, UA: 0.2 mg/dL (ref 0.0–1.0)
pH: 5 (ref 5.0–8.0)

## 2012-12-14 LAB — CBC WITH DIFFERENTIAL/PLATELET
Basophils Absolute: 0 10*3/uL (ref 0.0–0.1)
Basophils Relative: 1 % (ref 0–1)
Eosinophils Absolute: 0.1 10*3/uL (ref 0.0–0.7)
Eosinophils Relative: 1 % (ref 0–5)
HCT: 44.5 % (ref 39.0–52.0)
Hemoglobin: 15.1 g/dL (ref 13.0–17.0)
Lymphocytes Relative: 33 % (ref 12–46)
Lymphs Abs: 2.3 10*3/uL (ref 0.7–4.0)
MCH: 27.3 pg (ref 26.0–34.0)
MCHC: 33.9 g/dL (ref 30.0–36.0)
MCV: 80.3 fL (ref 78.0–100.0)
Monocytes Absolute: 0.8 10*3/uL (ref 0.1–1.0)
Monocytes Relative: 11 % (ref 3–12)
Neutro Abs: 3.8 10*3/uL (ref 1.7–7.7)
Neutrophils Relative %: 54 % (ref 43–77)
Platelets: 264 10*3/uL (ref 150–400)
RBC: 5.54 MIL/uL (ref 4.22–5.81)
RDW: 15.3 % (ref 11.5–15.5)
WBC: 7 10*3/uL (ref 4.0–10.5)

## 2012-12-14 LAB — POCT I-STAT TROPONIN I: Troponin i, poc: 0.01 ng/mL (ref 0.00–0.08)

## 2012-12-14 LAB — MAGNESIUM: Magnesium: 2 mg/dL (ref 1.5–2.5)

## 2012-12-14 LAB — PROTIME-INR
INR: 1.03 (ref 0.00–1.49)
Prothrombin Time: 13.4 seconds (ref 11.6–15.2)

## 2012-12-14 LAB — HEMOGLOBIN A1C
Hgb A1c MFr Bld: 5.6 % (ref <5.7)
Mean Plasma Glucose: 114 mg/dL (ref <117)

## 2012-12-14 LAB — AMYLASE: Amylase: 48 U/L (ref 0–105)

## 2012-12-14 LAB — PHOSPHORUS: Phosphorus: 3.5 mg/dL (ref 2.3–4.6)

## 2012-12-14 LAB — LIPASE, BLOOD: Lipase: 18 U/L (ref 11–59)

## 2012-12-14 LAB — TSH: TSH: 0.656 u[IU]/mL (ref 0.350–4.500)

## 2012-12-14 MED ORDER — NITROGLYCERIN 0.4 MG SL SUBL: 0.4000 mg | SUBLINGUAL_TABLET | SUBLINGUAL | Status: DC | PRN

## 2012-12-14 MED ORDER — DIAZEPAM 5 MG PO TABS
5.0000 mg | ORAL_TABLET | Freq: Four times a day (QID) | ORAL | Status: DC | PRN
  Administered 2012-12-15: 5 mg via ORAL
  Filled 2012-12-14: qty 1

## 2012-12-14 MED ORDER — ONDANSETRON HCL 4 MG/2ML IJ SOLN
4.0000 mg | Freq: Once | INTRAMUSCULAR | Status: AC
  Administered 2012-12-14: 4 mg via INTRAVENOUS
  Filled 2012-12-14: qty 2

## 2012-12-14 MED ORDER — EMTRICITAB-RILPIVIR-TENOFOV DF 200-25-300 MG PO TABS: 1.0000 | ORAL_TABLET | Freq: Every day | ORAL | Status: DC

## 2012-12-14 MED ORDER — RAMIPRIL 1.25 MG PO TABS: 0.6250 mg | ORAL_TABLET | Freq: Two times a day (BID) | ORAL | Status: DC

## 2012-12-14 MED ORDER — ASPIRIN 325 MG PO TABS
325.0000 mg | ORAL_TABLET | Freq: Every day | ORAL | Status: DC
  Administered 2012-12-14 – 2012-12-16 (×3): 325 mg via ORAL
  Filled 2012-12-14 (×3): qty 1

## 2012-12-14 MED ORDER — ENALAPRILAT 1.25 MG/ML IV SOLN
1.2500 mg | Freq: Once | INTRAVENOUS | Status: AC
  Administered 2012-12-14: 1.25 mg via INTRAVENOUS
  Filled 2012-12-14: qty 1

## 2012-12-14 MED ORDER — ADULT MULTIVITAMIN W/MINERALS CH
1.0000 | ORAL_TABLET | Freq: Every day | ORAL | Status: DC
Start: 2012-12-14 — End: 2012-12-16
  Administered 2012-12-14 – 2012-12-16 (×3): 1 via ORAL
  Filled 2012-12-14 (×3): qty 1

## 2012-12-14 MED ORDER — ACETAMINOPHEN 650 MG RE SUPP: 650.0000 mg | Freq: Four times a day (QID) | RECTAL | Status: DC | PRN

## 2012-12-14 MED ORDER — BACLOFEN 10 MG PO TABS
10.0000 mg | ORAL_TABLET | Freq: Three times a day (TID) | ORAL | Status: DC
  Administered 2012-12-14 – 2012-12-16 (×6): 10 mg via ORAL
  Filled 2012-12-14 (×9): qty 1

## 2012-12-14 MED ORDER — LABETALOL HCL 100 MG PO TABS
100.0000 mg | ORAL_TABLET | Freq: Two times a day (BID) | ORAL | Status: DC
  Administered 2012-12-14 – 2012-12-15 (×3): 100 mg via ORAL
  Filled 2012-12-14 (×5): qty 1

## 2012-12-14 MED ORDER — RAMIPRIL 1.25 MG PO CAPS
1.2500 mg | ORAL_CAPSULE | Freq: Every day | ORAL | Status: DC
  Filled 2012-12-14: qty 1

## 2012-12-14 MED ORDER — SODIUM CHLORIDE 0.9 % IJ SOLN: 3.0000 mL | INTRAMUSCULAR | Status: DC | PRN

## 2012-12-14 MED ORDER — ATORVASTATIN CALCIUM 80 MG PO TABS
80.0000 mg | ORAL_TABLET | Freq: Every day | ORAL | Status: DC
  Administered 2012-12-14: 80 mg via ORAL
  Filled 2012-12-14 (×2): qty 1

## 2012-12-14 MED ORDER — HYDROMORPHONE HCL PF 1 MG/ML IJ SOLN
1.0000 mg | Freq: Once | INTRAMUSCULAR | Status: AC
  Administered 2012-12-14: 1 mg via INTRAVENOUS
  Filled 2012-12-14: qty 1

## 2012-12-14 MED ORDER — ACETAMINOPHEN 325 MG PO TABS
650.0000 mg | ORAL_TABLET | Freq: Four times a day (QID) | ORAL | Status: DC | PRN
  Administered 2012-12-14 – 2012-12-15 (×3): 650 mg via ORAL
  Filled 2012-12-14 (×2): qty 2

## 2012-12-14 MED ORDER — EMTRICITABINE-TENOFOVIR DF 200-300 MG PO TABS
1.0000 | ORAL_TABLET | Freq: Every day | ORAL | Status: DC
  Administered 2012-12-14 – 2012-12-16 (×3): 1 via ORAL
  Filled 2012-12-14 (×6): qty 1

## 2012-12-14 MED ORDER — RILPIVIRINE HCL 25 MG PO TABS
25.0000 mg | ORAL_TABLET | Freq: Every day | ORAL | Status: DC
  Administered 2012-12-15 – 2012-12-16 (×2): 25 mg via ORAL
  Filled 2012-12-14 (×5): qty 1

## 2012-12-14 MED ORDER — SODIUM CHLORIDE 0.9 % IJ SOLN
3.0000 mL | Freq: Two times a day (BID) | INTRAMUSCULAR | Status: DC
  Administered 2012-12-14: 3 mL via INTRAVENOUS

## 2012-12-14 MED ORDER — DOXYCYCLINE HYCLATE 100 MG PO TABS
100.0000 mg | ORAL_TABLET | Freq: Two times a day (BID) | ORAL | Status: DC
  Administered 2012-12-14 – 2012-12-16 (×4): 100 mg via ORAL
  Filled 2012-12-14 (×5): qty 1

## 2012-12-14 MED ORDER — SODIUM CHLORIDE 0.9 % IJ SOLN
3.0000 mL | Freq: Two times a day (BID) | INTRAMUSCULAR | Status: DC
  Administered 2012-12-14 – 2012-12-15 (×2): 3 mL via INTRAVENOUS

## 2012-12-14 MED ORDER — MORPHINE SULFATE 2 MG/ML IJ SOLN
2.0000 mg | INTRAMUSCULAR | Status: DC | PRN
  Administered 2012-12-14 (×2): 2 mg via INTRAVENOUS
  Filled 2012-12-14 (×2): qty 1

## 2012-12-14 MED ORDER — HEPARIN SODIUM (PORCINE) 5000 UNIT/ML IJ SOLN
5000.0000 [IU] | Freq: Three times a day (TID) | INTRAMUSCULAR | Status: DC
  Administered 2012-12-14 – 2012-12-16 (×6): 5000 [IU] via SUBCUTANEOUS
  Filled 2012-12-14 (×10): qty 1

## 2012-12-14 MED ORDER — SODIUM CHLORIDE 0.9 % IV SOLN: 250.0000 mL | INTRAVENOUS | Status: DC | PRN

## 2012-12-14 NOTE — ED Notes (Signed)
Admitting MD at bedside.

## 2012-12-14 NOTE — ED Provider Notes (Signed)
History     CSN: [id number]  Arrival date & time 12/14/12  1324   First MD Initiated Contact with Patient 12/14/12 [phone]      Chief Complaint  Patient presents with  . Headache  . Chest Pain    rib pain, epigastric pain     (Consider location/radiation/quality/duration/timing/severity/associated sxs/prior treatment) HPI  Patient is a 58 yo M with PMHx significant for HIV, migraines, COPD, CAD, HTN, angina, and family Hx for aneurysms brought to ED via EMS for headache x 1 day and chest pain x 20 minutes. Patient noticed a draining spot on the top of his head two days ago that popped and drained with his headache began yesterday afternoon with sharp pain on the top of his head, patient describes pain as 10/10 at onset. Patient has history of migranes w/o aura, states this headache feels different than previous migraines. Headache radiates down back of neck. Worse with movement and sitting up. Denies photophobia or phonophobia, diplopia, blurred vision, visual disturbances, weakness, numbness, tingling, difficulty speaking or swallowing, no other neurofocal complaints. Upon arrival to hospital patient states he started having severe central sharp chest pains without radiation to arm, jaw, or back. Pain is not worsened with positions. No alleviating or aggravating factors for CP. No associated SOB, but does have chest tightness. Patient does have medical history that includes CAD and chest pain at rest. Patient states he is compliant with BP medications and has no other cardiac diseases/problems except CAD and chest pain.   Past Medical History  Diagnosis Date  . HIV positive 2004  . Hypertension   . Meningitis   . Coronary artery disease   . Chest pain at rest 09/23/2012  . Chronic bronchitis     "q year last 5 yr or so" (09/24/2012)  . Exertional dyspnea   . Migraines   . Arthritis     "both shoulders" (09/24/2012)  . Chronic lower back pain   . Pneumonia     Past Surgical History   Procedure Laterality Date  . Intussusception repair  10/2011  . Posterior lumbar fusion  1995  . Elbow surgery  ~ 1997    "removed some stones; right" (09/24/2012)  . Appendectomy  2013  . Back surgery    . Abdominal surgery      No family history on file.  History  Substance Use Topics  . Smoking status: Former Smoker -- 0.50 packs/day for 45 years    Types: Cigarettes    Quit date: 12/18/2011  . Smokeless tobacco: Never Used     Comment: 09/24/2012 "been stopped now ~ 8 month"  . Alcohol Use: No      Review of Systems  Constitutional: Negative for fever and chills.  HENT: Positive for neck pain.   Eyes: Negative for photophobia.  Respiratory: Positive for chest tightness.   Cardiovascular: Positive for chest pain.  Gastrointestinal: Negative for abdominal pain.  Musculoskeletal: Positive for back pain.  Skin: Positive for wound.  Allergic/Immunologic: Positive for immunocompromised state.  Neurological: Positive for headaches.    Allergies  Flexeril; Hydrocodone; Nabumetone; [name]; Tramadol; Bee venom; and Other  Home Medications   Current Outpatient Rx  Name  Route  Sig  Dispense  Refill  . baclofen (LIORESAL) 10 MG tablet   Oral   Take 1 tablet (10 mg total) by mouth 3 (three) times daily.   30 tablet   0   . diazepam (VALIUM) 5 MG tablet   Oral   Take 5  mg by mouth every 6 (six) hours as needed for anxiety.          . Emtricitab-Rilpivir-Tenofovir 200-25-300 MG TABS   Oral   Take 1 tablet by mouth daily.   30 tablet   11   . Multiple Vitamin (MULTIVITAMIN WITH MINERALS) TABS   Oral   Take 1 tablet by mouth daily.         Marland Kitchen oxyCODONE-acetaminophen (PERCOCET) 5-325 MG per tablet   Oral   Take 1-2 tablets by mouth every 4 (four) hours as needed for pain.   20 tablet   0   . ramipril (ALTACE) 1.25 MG tablet   Oral   Take 0.625 mg by mouth 2 (two) times daily.           BP 188/111  Pulse 66  Temp(Src) 97.5 F (36.4 C) (Oral)  Resp  11  SpO2 90%  Physical Exam  Constitutional: He is oriented to person, place, and time. He appears well-developed and well-nourished.  HENT:  Head: Normocephalic. No trismus in the jaw.  Right Ear: Hearing, tympanic membrane and external ear normal.  Left Ear: Hearing, tympanic membrane and external ear normal.  Nose: Nose normal.  Mouth/Throat: Uvula is midline, oropharynx is clear and moist and mucous membranes are normal.  Eyes: Conjunctivae and EOM are normal. Pupils are equal, round, and reactive to light.  Neck: Normal range of motion. Neck supple. No spinous process tenderness and no muscular tenderness present. Normal range of motion present.  Cardiovascular: Normal rate, regular rhythm, normal heart sounds and intact distal pulses.  Exam reveals no gallop and no friction rub.   No murmur heard. Pulmonary/Chest: Effort normal and breath sounds normal. No respiratory distress.  Abdominal: Soft. Bowel sounds are normal. There is no tenderness. There is no rigidity and no guarding.  Lymphadenopathy:    He has no cervical adenopathy.  Neurological: He is alert and oriented to person, place, and time. No cranial nerve deficit. He exhibits normal muscle tone. Coordination normal.  Skin: Skin is warm and dry.  Dime sized open abscess on scalp with drainage elicited.     ED Course  Procedures (including critical care time)   Date: 12/14/2012  Rate: 77  Rhythm: normal sinus rhythm  QRS Axis: normal  Intervals: normal  ST/T Wave abnormalities: nonspecific T wave changes  Conduction Disutrbances:left anterior fascicular block and borderline AV conduction delay  Narrative Interpretation:   Old EKG Reviewed: changes noted  Patient not given ASA due to severe allergy. Hospitalist consulted for admission for further evaluation and observation. Patient admitted for further evalution.  Medications  HYDROmorphone (DILAUDID) injection 1 mg (1 mg Intravenous Given 12/14/12 0920)   ondansetron (ZOFRAN) injection 4 mg (4 mg Intravenous Given 12/14/12 0919)  enalaprilat (VASOTEC) injection 1.25 mg (1.25 mg Intravenous Given 12/14/12 1157)     Results for orders placed during the hospital encounter of 12/14/12  CBC WITH DIFFERENTIAL      Result Value Range   WBC 7.0  4.0 - 10.5 K/uL   RBC 5.54  4.22 - 5.81 MIL/uL   Hemoglobin 15.1  13.0 - 17.0 g/dL   HCT 16.1  09.6 - 04.5 %   MCV 80.3  78.0 - 100.0 fL   MCH 27.3  26.0 - 34.0 pg   MCHC 33.9  30.0 - 36.0 g/dL   RDW 40.9  81.1 - 91.4 %   Platelets 264  150 - 400 K/uL   Neutrophils Relative 54  43 -  77 %   Neutro Abs 3.8  1.7 - 7.7 K/uL   Lymphocytes Relative 33  12 - 46 %   Lymphs Abs 2.3  0.7 - 4.0 K/uL   Monocytes Relative 11  3 - 12 %   Monocytes Absolute 0.8  0.1 - 1.0 K/uL   Eosinophils Relative 1  0 - 5 %   Eosinophils Absolute 0.1  0.0 - 0.7 K/uL   Basophils Relative 1  0 - 1 %   Basophils Absolute 0.0  0.0 - 0.1 K/uL  APTT      Result Value Range   aPTT 33  24 - 37 seconds  PROTIME-INR      Result Value Range   Prothrombin Time 13.4  11.6 - 15.2 seconds   INR 1.03  0.00 - 1.49  POCT I-STAT, CHEM 8      Result Value Range   Sodium 142  135 - 145 mEq/L   Potassium 3.8  3.5 - 5.1 mEq/L   Chloride 107  96 - 112 mEq/L   BUN 18  6 - 23 mg/dL   Creatinine, Ser 1.61  0.50 - 1.35 mg/dL   Glucose, Bld 93  70 - 99 mg/dL   Calcium, Ion 0.96  0.45 - 1.23 mmol/L   TCO2 24  0 - 100 mmol/L   Hemoglobin 17.0  13.0 - 17.0 g/dL   HCT 40.9  81.1 - 91.4 %  POCT I-STAT TROPONIN I      Result Value Range   Troponin i, poc 0.01  0.00 - 0.08 ng/mL   Comment 3             Ct Head Wo Contrast  12/14/2012  *RADIOLOGY REPORT*  Clinical Data: Headache  CT HEAD WITHOUT CONTRAST  Technique:  Contiguous axial images were obtained from the base of the skull through the vertex without contrast.  Comparison: 11/24/2012  Findings: The brain stem, cerebellum, cerebral peduncles, thalami, basal ganglia, basilar cisterns, and  ventricular system appear unremarkable.  No intracranial hemorrhage, mass lesion, or acute infarction is identified.  Stable benign appearing low density scalp lesion noted along the left parietal scalp.  The visualized paranasal sinuses appear clear.  IMPRESSION:  1.  No acute intracranial findings.   Original Report Authenticated By: Gaylyn Rong, M.D.    Dg Chest Port 1 View  12/14/2012  *RADIOLOGY REPORT*  Clinical Data: Left anterior chest pain.  Headache.  Shortness breath.  PORTABLE CHEST - 1 VIEW  Comparison: 11/24/2012  Findings: Severe emphysema noted.  Stable scarring medially at the right lung base. Cardiac and mediastinal contours appear unremarkable.  No pleural effusion noted.  IMPRESSION:  1.  Stable scarring medially at the right lung base. 2.  Severe emphysema.   Original Report Authenticated By: Gaylyn Rong, M.D.      1. Headache   2. Scalp abscess   3. Hypertension   4. Chest pain       MDM  Patient is a 57 yo M with PMHx significant for HIV, migraines, COPD, CAD, HTN, angina, and family Hx for aneurysms brought to ED via EMS for severe headache x 1 day and chest pain x 20 minutes.   1) Headache: Patient has history of migraine HAs w/o aura, this headache was different than previous headaches and started severe 10/10 pain at onset. Pain was worse with positions and movements. CT head w/o contrast was done to r/o hemorrhage and aneurysm. CT did not show acute intracranial findings. Patient had his  blood pressure controled with enalaprit. Pain was managed in ED with meds listed above.  Patient will be admitted for possible MRA for further evaluation for aneurysm.   2) Chest pain: Patient has cardiac history including HTN, CAD, angina, but denies further cardiac medical history. Cardiac work up was initiated. Cardiac, pulmonary, and extremitiy physical examination were unremarkable as listed above. Labs and EKG were unremarkable. There were indeterminate non-specific T  wave changes on today's EKG from previous EKG. Patient was not given ASA for chest pain due to severe allergy to ASA. Patient's pain and symptoms were managed with meds listed above. Patient was also admitted for further management of chest pain, along with severe headache.   Patient case was discussed with Dr. Hyacinth Meeker, agrees to plan. Patient agreeable to admission. Patient stable for transport to inpatient.       Jeannetta Ellis, PA-C 12/14/12 1722
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
## 2012-12-14 NOTE — Progress Notes (Signed)
Pt continues to complain of headache despite 2 mg morphine IV given and 2 tylenol. MD paged and made aware. MD to put in new orders for pain control. Will continue to monitor Kenneth [name], Kenneth [name]

## 2012-12-14 NOTE — Consult Note (Signed)
Reason for Consult: Chest pain Referring Physician: [facility] hospitalist  Tellis Spivak is an 57 y.o. male.  HPI: This 57 year old gentleman is admitted because of atypical chest pain and new EKG changes.  He has a complicated past medical history including HIV followed by Dr. Orvan Falconer, hypertension, chronic low back pain, migraines, benign recurrent aseptic meningitis, substance abuse (crack cocaine) and COPD.  He does not have any history of documented ischemic heart disease.  He was in route to the hospital today because of headache when he started to experience sharp lower anterior chest pain worse with inspiration centered just under the left anterior rib cage.  He denies any substernal chest discomfort.  His initial point-of-care troponins were negative.  His electrocardiogram in the emergency room showed new inferior T wave inversion when compared to previous EKG of 11/24/12.  Past Medical History  Diagnosis Date  . HIV positive 2004  . Hypertension   . Meningitis   . Coronary artery disease   . Chest pain at rest 09/23/2012  . Chronic bronchitis     "q year last 5 yr or so" (09/24/2012)  . Exertional dyspnea   . Migraines   . Arthritis     "both shoulders" (09/24/2012)  . Chronic lower back pain   . Pneumonia     Past Surgical History  Procedure Laterality Date  . Intussusception repair  10/2011  . Posterior lumbar fusion  1995  . Elbow surgery  ~ 1997    "removed some stones; right" (09/24/2012)  . Appendectomy  2013  . Back surgery    . Abdominal surgery      No family history on file.  Social History:  reports that he quit smoking about a year ago. His smoking use included Cigarettes. He has a 22.5 pack-year smoking history. He has never used smokeless tobacco. He reports that he uses illicit drugs (Cocaine). He reports that he does not drink alcohol.  Allergies:  Allergies  Allergen Reactions  . Flexeril (Cyclobenzaprine Hcl) Anaphylaxis, Shortness Of Breath and Rash  .  Hydrocodone Itching  . Nabumetone Shortness Of Breath, Itching and Other (See Comments)    "made me feel like my wind was maybe cutting off" (09/24/2012)  . [name] (Naproxen) Shortness Of Breath, Nausea And Vomiting and Rash  . Tramadol Shortness Of Breath and Rash    Multiple previous morphine administrations ok (07/12/11-DJ)  . Bee Venom Itching and Other (See Comments)    Throat, some throat swelling also (intermittantly)  . Other Itching    To throat.(grits and grape jelly). coleslaw    Medications:  Scheduled: . aspirin  325 mg Oral Daily  . atorvastatin  80 mg Oral q1800  . baclofen  10 mg Oral TID  . Emtricitab-Rilpivir-Tenofovir  1 tablet Oral Daily  . heparin  5,000 Units Subcutaneous Q8H  . labetalol  100 mg Oral BID  . multivitamin with minerals  1 tablet Oral Daily  . ramipril  0.625 mg Oral BID  . sodium chloride  3 mL Intravenous Q12H  . sodium chloride  3 mL Intravenous Q12H    Results for orders placed during the hospital encounter of 12/14/12 (from the past 48 hour(s))  CBC WITH DIFFERENTIAL     Status: None   Collection Time    12/14/12  9:03 AM      Result Value Range   WBC 7.0  4.0 - 10.5 K/uL   RBC 5.54  4.22 - 5.81 MIL/uL   Hemoglobin 15.1  13.0 -  17.0 g/dL   HCT 16.1  09.6 - 04.5 %   MCV 80.3  78.0 - 100.0 fL   MCH 27.3  26.0 - 34.0 pg   MCHC 33.9  30.0 - 36.0 g/dL   RDW 40.9  81.1 - 91.4 %   Platelets 264  150 - 400 K/uL   Neutrophils Relative 54  43 - 77 %   Neutro Abs 3.8  1.7 - 7.7 K/uL   Lymphocytes Relative 33  12 - 46 %   Lymphs Abs 2.3  0.7 - 4.0 K/uL   Monocytes Relative 11  3 - 12 %   Monocytes Absolute 0.8  0.1 - 1.0 K/uL   Eosinophils Relative 1  0 - 5 %   Eosinophils Absolute 0.1  0.0 - 0.7 K/uL   Basophils Relative 1  0 - 1 %   Basophils Absolute 0.0  0.0 - 0.1 K/uL  APTT     Status: None   Collection Time    12/14/12  9:03 AM      Result Value Range   aPTT 33  24 - 37 seconds  PROTIME-INR     Status: None   Collection  Time    12/14/12  9:03 AM      Result Value Range   Prothrombin Time 13.4  11.6 - 15.2 seconds   INR 1.03  0.00 - 1.49  POCT I-STAT TROPONIN I     Status: None   Collection Time    12/14/12  9:18 AM      Result Value Range   Troponin i, poc 0.01  0.00 - 0.08 ng/mL   Comment 3            Comment: Due to the release kinetics of cTnI,     a negative result within the first hours     of the onset of symptoms does not rule out     myocardial infarction with certainty.     If myocardial infarction is still suspected,     repeat the test at appropriate intervals.  POCT I-STAT, CHEM 8     Status: None   Collection Time    12/14/12  9:20 AM      Result Value Range   Sodium 142  135 - 145 mEq/L   Potassium 3.8  3.5 - 5.1 mEq/L   Chloride 107  96 - 112 mEq/L   BUN 18  6 - 23 mg/dL   Creatinine, Ser 7.82  0.50 - 1.35 mg/dL   Glucose, Bld 93  70 - 99 mg/dL   Calcium, Ion 9.56  2.13 - 1.23 mmol/L   TCO2 24  0 - 100 mmol/L   Hemoglobin 17.0  13.0 - 17.0 g/dL   HCT 08.6  57.8 - 46.9 %    Ct Head Wo Contrast  12/14/2012  *RADIOLOGY REPORT*  Clinical Data: Headache  CT HEAD WITHOUT CONTRAST  Technique:  Contiguous axial images were obtained from the base of the skull through the vertex without contrast.  Comparison: 11/24/2012  Findings: The brain stem, cerebellum, cerebral peduncles, thalami, basal ganglia, basilar cisterns, and ventricular system appear unremarkable.  No intracranial hemorrhage, mass lesion, or acute infarction is identified.  Stable benign appearing low density scalp lesion noted along the left parietal scalp.  The visualized paranasal sinuses appear clear.  IMPRESSION:  1.  No acute intracranial findings.   Original Report Authenticated By: Gaylyn Rong, M.D.    Mr [facility] Wo Contrast  12/14/2012  *RADIOLOGY  REPORT*  Clinical Data:  Headache and family history of aneurysms.  MRI HEAD WITHOUT CONTRAST MRA HEAD WITHOUT CONTRAST  Technique:  Multiplanar, multiecho pulse  sequences of the brain and surrounding structures were obtained without intravenous contrast. Angiographic images of the head were obtained using MRA technique without contrast.  Comparison:   CT head without contrast 12/14/2012.  MRI HEAD  Findings:  No acute infarct, hemorrhage, mass lesion is present. Mild generalized atrophy is present.  There is no significant white matter disease.  A 15 x 20 x 14 mm subcutaneous cystic lesion is noted in the left parietal scalp.  There is restricted diffusion, suggesting infection.  Flow is present in the major intracranial arteries.  The globes and orbits are intact.  The paranasal sinuses and mastoid air cells are clear.  IMPRESSION:  1.  Mild generalized atrophy.  This is slightly advanced for age may be related to HIV. 2.  No significant white matter disease. 3.  20 mm subcutaneous lesion in the left parietal scalp.  This has been present since December.  Restricted diffusion suggests early proteinaceous material or possible infection.  This may represent a sebaceous cyst.  MRA HEAD  Findings: The internal carotid arteries are within normal limits from the high cervical segments through the ICA termini bilaterally.  The A1 and M1 segments are normal.  ACA and MCA branch vessels are within normal limits.  The right vertebral artery is the dominant vessel.  The left PICA origin is visualized and within normal limits.  The right PICA origin is below the field of view.  The basilar artery is normal. A left posterior cerebral artery originates from basilar tip.  The right posterior cerebral artery is of fetal type with a tiny right P1 segment.  IMPRESSION:  Normal variant MRA circle of Willis without evidence for significant proximal stenosis, aneurysm, or branch vessel occlusion.   Original Report Authenticated By: Marin Roberts, M.D.    Mr Brain Wo Contrast  12/14/2012  *RADIOLOGY REPORT*  Clinical Data:  Headache and family history of aneurysms.  MRI HEAD WITHOUT  CONTRAST MRA HEAD WITHOUT CONTRAST  Technique:  Multiplanar, multiecho pulse sequences of the brain and surrounding structures were obtained without intravenous contrast. Angiographic images of the head were obtained using MRA technique without contrast.  Comparison:   CT head without contrast 12/14/2012.  MRI HEAD  Findings:  No acute infarct, hemorrhage, mass lesion is present. Mild generalized atrophy is present.  There is no significant white matter disease.  A 15 x 20 x 14 mm subcutaneous cystic lesion is noted in the left parietal scalp.  There is restricted diffusion, suggesting infection.  Flow is present in the major intracranial arteries.  The globes and orbits are intact.  The paranasal sinuses and mastoid air cells are clear.  IMPRESSION:  1.  Mild generalized atrophy.  This is slightly advanced for age may be related to HIV. 2.  No significant white matter disease. 3.  20 mm subcutaneous lesion in the left parietal scalp.  This has been present since December.  Restricted diffusion suggests early proteinaceous material or possible infection.  This may represent a sebaceous cyst.  MRA HEAD  Findings: The internal carotid arteries are within normal limits from the high cervical segments through the ICA termini bilaterally.  The A1 and M1 segments are normal.  ACA and MCA branch vessels are within normal limits.  The right vertebral artery is the dominant vessel.  The left PICA origin is visualized  and within normal limits.  The right PICA origin is below the field of view.  The basilar artery is normal. A left posterior cerebral artery originates from basilar tip.  The right posterior cerebral artery is of fetal type with a tiny right P1 segment.  IMPRESSION:  Normal variant MRA circle of Willis without evidence for significant proximal stenosis, aneurysm, or branch vessel occlusion.   Original Report Authenticated By: Marin Roberts, M.D.    Dg Chest Port 1 View  12/14/2012  *RADIOLOGY REPORT*   Clinical Data: Left anterior chest pain.  Headache.  Shortness breath.  PORTABLE CHEST - 1 VIEW  Comparison: 11/24/2012  Findings: Severe emphysema noted.  Stable scarring medially at the right lung base. Cardiac and mediastinal contours appear unremarkable.  No pleural effusion noted.  IMPRESSION:  1.  Stable scarring medially at the right lung base. 2.  Severe emphysema.   Original Report Authenticated By: Gaylyn Rong, M.D.     Review of systems positive for recent severe headaches. Blood pressure 152/101, pulse 65, temperature 97.7 F (36.5 C), temperature source Oral, resp. rate 18, height 5\' 9"  (1.753 m), weight 157 lb 3.2 oz (71.305 kg), SpO2 100.00%. The patient appears to be in no distress.  Head and neck exam reveals that the pupils are equal and reactive.  The extraocular movements are full.  There is no scleral icterus.  Mouth and pharynx are benign.  No lymphadenopathy.  No carotid bruits.  The jugular venous pressure is normal.  Thyroid is not enlarged or tender.  Chest is clear to percussion and auscultation.  No rales or rhonchi.  Expansion of the chest is symmetrical.  Heart reveals no abnormal lift or heave.  First and second heart sounds are normal.  There is no murmur gallop rub or click.  The abdomen is soft and he is mildly tender in the left upper quadrant.  Bowel sounds are normoactive.  There is no hepatosplenomegaly or mass.  There are no abdominal bruits.  Extremities reveal no phlebitis or edema.  Pedal pulses are good.  There is no cyanosis or clubbing.  Neurologic exam is normal strength and no lateralizing weakness.  No sensory deficits.  Integument reveals no rash  EKG shows new inferior wall T-wave inversion since previous tracing.  These changes are nonspecific.  Assessment/Plan: 1. Atypical chest pain.  The location of the pain is actually in the left upper quadrant.  He was also tender in the left upper quadrant.      Plan: Because of the pleuritic  nature of the pain we will obtain a two-dimensional echocardiogram; auscultation does not reveal any pericardial rub.  I will also order liver function tests and lipase and amylase since pancreatitis can sometimes cause T-wave abnormalities on the EKG and he is tender in the left epigastrium and left upper quadrant.  Will follow with you.  Cassell Clement 12/14/2012, 2:30 PM

## 2012-12-14 NOTE — ED Notes (Addendum)
Per EMS- Pt states headache since yesterday. Went to the doctor but no relief. Describes as sharp starting at middle head radiating to back of head. Pt has bump on top of head that he states spontaneously popped this morning. Covered with gauze. Reports migraine hx but this feels different. Initial BP 194/120 took BP meds this morning. Most recent BP 170/110, reports epigastric pain. Stroke scale negative. Pt crying, tearful in pain.
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
## 2012-12-15 ENCOUNTER — Encounter (HOSPITAL_COMMUNITY): Payer: Self-pay | Admitting: Internal Medicine

## 2012-12-15 DIAGNOSIS — I369 Nonrheumatic tricuspid valve disorder, unspecified: Secondary | ICD-10-CM

## 2012-12-15 DIAGNOSIS — R1012 Left upper quadrant pain: Secondary | ICD-10-CM

## 2012-12-15 DIAGNOSIS — R9431 Abnormal electrocardiogram [ECG] [EKG]: Secondary | ICD-10-CM | POA: Diagnosis present

## 2012-12-15 LAB — LIPID PANEL
Cholesterol: 184 mg/dL (ref 0–200)
HDL: 38 mg/dL — ABNORMAL LOW (ref >39)
LDL Cholesterol: 134 mg/dL — ABNORMAL HIGH (ref 0–99)
Total CHOL/HDL Ratio: 4.8 RATIO
Triglycerides: 58 mg/dL (ref <150)
VLDL: 12 mg/dL (ref 0–40)

## 2012-12-15 LAB — TROPONIN I: Troponin I: <0.3 ng/mL (ref <0.30)

## 2012-12-15 MED ORDER — ALBUTEROL SULFATE (5 MG/ML) 0.5% IN NEBU
INHALATION_SOLUTION | RESPIRATORY_TRACT | Status: AC
  Filled 2012-12-15: qty 0.5

## 2012-12-15 MED ORDER — HYDROMORPHONE HCL PF 1 MG/ML IJ SOLN
1.0000 mg | INTRAMUSCULAR | Status: DC | PRN
  Administered 2012-12-15 – 2012-12-16 (×5): 1 mg via INTRAVENOUS
  Filled 2012-12-15 (×5): qty 1

## 2012-12-15 MED ORDER — GI COCKTAIL ~~LOC~~
30.0000 mL | Freq: Once | ORAL | Status: AC
  Administered 2012-12-15: 30 mL via ORAL
  Filled 2012-12-15: qty 30

## 2012-12-15 MED ORDER — SIMVASTATIN 10 MG PO TABS
10.0000 mg | ORAL_TABLET | Freq: Every day | ORAL | Status: DC
  Administered 2012-12-15: 10 mg via ORAL
  Filled 2012-12-15 (×2): qty 1

## 2012-12-15 MED ORDER — RAMIPRIL 1.25 MG PO CAPS
1.2500 mg | ORAL_CAPSULE | Freq: Every day | ORAL | Status: DC
  Administered 2012-12-15 – 2012-12-16 (×2): 1.25 mg via ORAL
  Filled 2012-12-15 (×2): qty 1

## 2012-12-15 MED ORDER — HYDROMORPHONE HCL PF 1 MG/ML IJ SOLN
1.0000 mg | Freq: Once | INTRAMUSCULAR | Status: AC
  Administered 2012-12-15: 1 mg via INTRAVENOUS
  Filled 2012-12-15: qty 1

## 2012-12-15 MED ORDER — ALBUTEROL SULFATE HFA 108 (90 BASE) MCG/ACT IN AERS
2.0000 | INHALATION_SPRAY | Freq: Four times a day (QID) | RESPIRATORY_TRACT | Status: DC | PRN
  Filled 2012-12-15: qty 6.7

## 2012-12-15 MED ORDER — MOMETASONE FURO-FORMOTEROL FUM 100-5 MCG/ACT IN AERO
2.0000 | INHALATION_SPRAY | Freq: Two times a day (BID) | RESPIRATORY_TRACT | Status: DC
  Administered 2012-12-15 – 2012-12-16 (×2): 2 via RESPIRATORY_TRACT
  Filled 2012-12-15: qty 8.8

## 2012-12-15 MED ORDER — ALBUTEROL SULFATE (5 MG/ML) 0.5% IN NEBU
2.5000 mg | INHALATION_SOLUTION | Freq: Once | RESPIRATORY_TRACT | Status: AC
  Administered 2012-12-15: 2.5 mg via RESPIRATORY_TRACT

## 2012-12-15 MED ORDER — GI COCKTAIL ~~LOC~~
30.0000 mL | Freq: Two times a day (BID) | ORAL | Status: DC | PRN
  Administered 2012-12-15: 30 mL via ORAL
  Filled 2012-12-15: qty 30
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
## 2012-12-15 NOTE — Progress Notes (Signed)
  Echocardiogram 2D Echocardiogram has been performed.  Kenneth [name] 12/15/2012, 10:38 AM

## 2012-12-15 NOTE — Progress Notes (Signed)
Pt ambulated 150 ft with standby assist only. Pt ambulated on room air oxygen saturations 100% throughout entire ambulation. Dion Saucier
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
## 2012-12-15 NOTE — ED Provider Notes (Signed)
Medical screening examination/treatment/procedure(s) were conducted as a shared visit with non-physician practitioner(s) and myself.  I personally evaluated the patient during the encounter  Please see my separate respective documentation pertaining to this patient encounter   Vida Roller, MD 12/15/12 [phone]

## 2012-12-15 NOTE — Progress Notes (Signed)
   Consulting cardiologist: Dr. Cassell Clement  Subjective:   Complaints of headache, some blood when he blow his nose this AM. Still has left upper quadrant discomfort. States normal bowel pattern.   Objective:   Temp:  [97.7 F (36.5 C)-98.1 F (36.7 C)] 98.1 F (36.7 C) (03/30 0446) Pulse Rate:  [56-76] 72 (03/30 0446) Resp:  [10-19] 18 (03/30 0446) BP: (110-188)/(70-111) 120/79 mmHg (03/30 0446) SpO2:  [90 %-100 %] 98 % (03/30 0446) Weight:  [157 lb 3.2 oz (71.305 kg)] 157 lb 3.2 oz (71.305 kg) (03/29 1424) Last BM Date: 12/13/12  Filed Weights   12/14/12 1424  Weight: 157 lb 3.2 oz (71.305 kg)    Intake/Output Summary (Last 24 hours) at 12/15/12 0916 Last data filed at 12/15/12 0446  Gross per 24 hour  Intake      0 ml  Output    400 ml  Net   -400 ml   Telemetry: Sinus rhythm.  Exam:  General: NAD.  Lungs: Clear, nonlabored.  Cardiac: RRR, no rub.  Abdomen: No guarding.  Extremities: No Edema.  Lab Results:  Basic Metabolic Panel:  Recent Labs Lab 12/14/12 0920 12/14/12 1440  NA 142 136  K 3.8 4.2  CL 107 103  CO2  --  27  GLUCOSE 93 94  BUN 18 15  CREATININE 0.70 0.82  CALCIUM  --  9.3  MG  --  2.0    Liver Function Tests:  Recent Labs Lab 12/14/12 1440  AST 13  ALT 8  ALKPHOS 101  BILITOT 0.4  PROT 8.8*  ALBUMIN 3.5    CBC:  Recent Labs Lab 12/14/12 0903 12/14/12 0920  WBC 7.0  --   HGB 15.1 17.0  HCT 44.5 50.0  MCV 80.3  --   PLT 264  --     Cardiac Enzymes:  Recent Labs Lab 12/14/12 1440 12/14/12 2030 12/15/12 0234  TROPONINI <0.30 <0.30 <0.30   Amylase and lipase normal  Medications:   Scheduled Medications: . aspirin  325 mg Oral Daily  . atorvastatin  80 mg Oral q1800  . baclofen  10 mg Oral TID  . doxycycline  100 mg Oral Q12H  . emtricitabine-tenofovir  1 tablet Oral Q breakfast  . heparin  5,000 Units Subcutaneous Q8H  . labetalol  100 mg Oral BID  . multivitamin with minerals  1 tablet  Oral Daily  . rilpivirine  25 mg Oral Q breakfast  . sodium chloride  3 mL Intravenous Q12H  . sodium chloride  3 mL Intravenous Q12H     PRN Medications:  sodium chloride, acetaminophen, acetaminophen, albuterol, diazepam, morphine injection, nitroGLYCERIN, sodium chloride   Assessment:   1. Left upper quadrant discomfort - at this point doubt cardiac etiology. Cardiac markers negative. ECG is abnormal as noted previously. Amylase and lipase, LFTs unremarkable.  2. HTN.  3. HIV.  4. History of substance abuse. UDS negative.   Plan/Discussion:    Will followup echocardiogram when done (exclude pericardial effusion and cardiomyopathy). Further workup per primary team.   Jonelle Sidle, M.D., F.A.C.C.

## 2012-12-16 DIAGNOSIS — I509 Heart failure, unspecified: Secondary | ICD-10-CM

## 2012-12-16 HISTORY — DX: Heart failure, unspecified: I50.9

## 2012-12-16 LAB — BASIC METABOLIC PANEL
BUN: 14 mg/dL (ref 6–23)
CO2: 27 mEq/L (ref 19–32)
Calcium: 9.5 mg/dL (ref 8.4–10.5)
Chloride: 102 mEq/L (ref 96–112)
Creatinine, Ser: 0.88 mg/dL (ref 0.50–1.35)
GFR calc Af Amer: >90 mL/min (ref >90)
GFR calc non Af Amer: >90 mL/min (ref >90)
Glucose, Bld: 106 mg/dL — ABNORMAL HIGH (ref 70–99)
Potassium: 3.7 mEq/L (ref 3.5–5.1)
Sodium: 137 mEq/L (ref 135–145)

## 2012-12-16 LAB — CBC
HCT: 44 % (ref 39.0–52.0)
Hemoglobin: 14.7 g/dL (ref 13.0–17.0)
MCH: 27.7 pg (ref 26.0–34.0)
MCHC: 33.4 g/dL (ref 30.0–36.0)
MCV: 83 fL (ref 78.0–100.0)
Platelets: 269 10*3/uL (ref 150–400)
RBC: 5.3 MIL/uL (ref 4.22–5.81)
RDW: 15.3 % (ref 11.5–15.5)
WBC: 6.9 10*3/uL (ref 4.0–10.5)

## 2012-12-16 MED ORDER — PANTOPRAZOLE SODIUM 40 MG PO TBEC: 40.0000 mg | DELAYED_RELEASE_TABLET | Freq: Every day | ORAL | Status: DC

## 2012-12-16 MED ORDER — DOXYCYCLINE HYCLATE 100 MG PO TABS: 100.0000 mg | ORAL_TABLET | Freq: Two times a day (BID) | ORAL | Status: DC

## 2012-12-16 MED ORDER — LOVASTATIN 20 MG PO TABS: 20.0000 mg | ORAL_TABLET | Freq: Every day | ORAL | Status: DC

## 2012-12-16 MED ORDER — SUCRALFATE 1 G PO TABS: 1.0000 g | ORAL_TABLET | Freq: Four times a day (QID) | ORAL | Status: DC
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
## 2012-12-16 NOTE — Care Management Note (Signed)
    Page 1 of 1   12/16/2012     2:17:13 PM   CARE MANAGEMENT NOTE 12/16/2012  Patient:  Kenneth [name], Kenneth [name]   Account Number:  [id number]  Date Initiated:  12/16/2012  Documentation initiated by:  Ovie Eastep  Subjective/Objective Assessment:   PT ADM WITH CHEST PAIN ON 12/14/12.  PTA, PT INDEPENDENT OF ADLS.  PMH OF COPD, HIV.     Action/Plan:   MET WITH PT TO DISCUSS MED AFFORDABILITY, AS HE STATES HE IS HAVING TROUBLE AFFORDING SOME OF HIS RX.   Anticipated DC Date:  12/16/2012   Anticipated DC Plan:  HOME/SELF CARE      DC Planning Services  CM consult  Medication Assistance      Choice offered to / List presented to:             Status of service:  Completed, signed off Medicare Important Message given?   (If response is "NO", the following Medicare IM given date fields will be blank) Date Medicare IM given:   Date Additional Medicare IM given:    Discharge Disposition:  HOME/SELF CARE  Per UR Regulation:  Reviewed for med. necessity/level of care/duration of stay  If discussed at Long Length of Stay Meetings, dates discussed:    Comments:  12/16/12 Perian Tedder,RN,BSN [phone] PT GIVEN COMBIVENT FREE TRIAL CARD GOOD FOR ONE FREE INHALER.  PT STATES HE HAS TROUBLE PURCHASING LIDODERM PATCHES, BUT THERE ARE NO COUPONS OR ASSIST PROGRAMS FOR THIS MED.  PT STATES HE HAS NO TROUBLE PAYING FOR ANY OF HIS OTHER MEDS.  PT STATES HE HAS APPLIED FOR MEDICAID, APPROX ONE MONTH AGO.
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
## 2012-12-16 NOTE — Progress Notes (Signed)
Subjective:  Feels much better this am.  LUQ pain has essentially resolved. Afebrile.  Objective:  Vital Signs in the last 24 hours: Temp:  [97.9 F (36.6 C)-98 F (36.7 C)] 97.9 F (36.6 C) (03/31 0421) Pulse Rate:  [57-69] 58 (03/31 0421) Resp:  [18-20] 20 (03/31 0421) BP: (121-128)/(77-97) 126/77 mmHg (03/31 0421) SpO2:  [93 %-100 %] 93 % (03/31 0421) Weight:  [155 lb 3.2 oz (70.398 kg)] 155 lb 3.2 oz (70.398 kg) (03/31 0421)  Intake/Output from previous day: 03/30 0701 - 03/31 0700 In: 360 [P.O.:360] Out: -  Intake/Output from this shift:    . aspirin  325 mg Oral Daily  . baclofen  10 mg Oral TID  . doxycycline  100 mg Oral Q12H  . emtricitabine-tenofovir  1 tablet Oral Q breakfast  . heparin  5,000 Units Subcutaneous Q8H  . mometasone-formoterol  2 puff Inhalation BID  . multivitamin with minerals  1 tablet Oral Daily  . ramipril  1.25 mg Oral Daily  . rilpivirine  25 mg Oral Q breakfast  . simvastatin  10 mg Oral q1800  . sodium chloride  3 mL Intravenous Q12H  . sodium chloride  3 mL Intravenous Q12H      Physical Exam: The patient appears to be in no distress.  Head and neck exam reveals that the pupils are equal and reactive.  The extraocular movements are full.  There is no scleral icterus.  Mouth and pharynx are benign.  No lymphadenopathy.  No carotid bruits.  The jugular venous pressure is normal.  Thyroid is not enlarged or tender.  Chest is clear to percussion and auscultation.  No rales or rhonchi.  Expansion of the chest is symmetrical.  Heart reveals no abnormal lift or heave.  First and second heart sounds are normal.  There is no murmur gallop rub or click.  The abdomen is soft and nontender.  Bowel sounds are normoactive.  There is no hepatosplenomegaly or mass.  There are no abdominal bruits.  Extremities reveal no phlebitis or edema.  Pedal pulses are good.  There is no cyanosis or clubbing.  Neurologic exam is normal strength and no  lateralizing weakness.  No sensory deficits.  Integument reveals no rash  Lab Results:  Recent Labs  12/14/12 0903 12/14/12 0920  WBC 7.0  --   HGB 15.1 17.0  PLT 264  --     Recent Labs  12/14/12 0920 12/14/12 1440  NA 142 136  K 3.8 4.2  CL 107 103  CO2  --  27  GLUCOSE 93 94  BUN 18 15  CREATININE 0.70 0.82    Recent Labs  12/14/12 2030 12/15/12 0234  TROPONINI <0.30 <0.30   Hepatic Function Panel  Recent Labs  12/14/12 1440  PROT 8.8*  ALBUMIN 3.5  AST 13  ALT 8  ALKPHOS 101  BILITOT 0.4    Recent Labs  12/15/12 0234  CHOL 184   No results found for this basename: PROTIME,  in the last 72 hours  Imaging: Imaging results have been reviewed  Cardiac Studies: 2D Echo: Study Conclusions  - Left ventricle: The cavity size was normal. Wall thickness was normal. Systolic function was mildly to moderately reduced. The estimated ejection fraction was in the range of 40% to 45%. Wall motion was normal; there were no regional wall motion abnormalities. Doppler parameters are consistent with abnormal left ventricular relaxation (grade 1 diastolic dysfunction). - Aortic root: The aortic root was mildly dilated. -  Right atrium: The atrium was mildly dilated. - Atrial septum: No defect or patent foramen ovale was identified.   Assessment/Plan:  1. Left upper quadrant discomfort - at this point doubt cardiac etiology. Cardiac markers negative. ECG is abnormal as noted previously. Amylase and lipase, LFTs unremarkable.  2. HTN.  3. HIV.  4. History of substance abuse. UDS negative. 5. Mild reduction of LV systolic function possibly secondary to previous cocaine abuse.  Plan: Continue ACEi. No BB because of history of cocaine abuse.  From cardiac standpoint okay for discharge.  LOS: 2 days    Kenneth [name] 12/16/2012, 7:40 AM

## 2012-12-16 NOTE — Progress Notes (Addendum)
Internal Medicine Teaching Service Attending Note Date: 12/16/2012  Patient name: Kenneth [name]  Medical record number: [id number]  Date of birth: 08/06/1956  Kenneth [name] seems much better today. He says his headache is resolved and his chest pain/stomach pain is much improved with the GI cocktail. He has no new complaints.  Filed Vitals:   12/16/12 0421  BP: 126/77  Pulse: 58  Temp: 97.9 F (36.6 C)  Resp: 20   General: Comfortably resting in bed Heart: S1S2 RRR, no murmur Lungs: Clear   Recent Labs Lab 12/14/12 0920 12/14/12 1440 12/16/12 0850  NA 142 136 137  K 3.8 4.2 3.7  CL 107 103 102  CO2  --  27 27  GLUCOSE 93 94 106*  BUN 18 15 14   CREATININE 0.70 0.82 0.88  CALCIUM  --  9.3 9.5  MG  --  2.0  --   PHOS  --  3.5  --     Recent Labs Lab 12/14/12 0903 12/14/12 0920 12/16/12 0850  HGB 15.1 17.0 14.7  HCT 44.5 50.0 44.0  WBC 7.0  --  6.9  PLT 264  --  269    Assessment and Plan   Chest pain likely associated with GERD. Chest pain resolved after giving GI Cocktail and PPI. Curiously, T wave inversions persist, cardiac enzymes negative and ECHO does not show any evidence of pericarditis. Seen by cardiology who do not suspect any cardiac involvement at this point. Change labetolol to ramipril for blood pressure. Counseled against cocaine use. Counseled for dietary changes he might have to make for GERD.   Headache likely due to pain in sebaceous cyst. Resolved. Surgery outpatient phone number will be given to the patient to schedule an appointment if the left side sebaceous cyst on his head starts hurting.   Okay to discharge with early PCP follow up.   Addendum: I checked interaction with Rilpirine and PPI, and it is best to avoid PPI with this drug. We will either try the patient on H2 blocker or carafate.   Kenneth [name] 12/16/2012, 11:12 AM.

## 2012-12-17 LAB — WOUND CULTURE: Gram Stain: NONE SEEN

## 2012-12-19 ENCOUNTER — Emergency Department (HOSPITAL_COMMUNITY)
Admission: EM | Admit: 2012-12-19 | Discharge: 2012-12-19 | Disposition: A | Discharge status: Home / Self Care | Payer: Medicare Other | Attending: Emergency Medicine | Admitting: Emergency Medicine

## 2012-12-19 ENCOUNTER — Encounter (HOSPITAL_COMMUNITY): Payer: Self-pay | Admitting: Emergency Medicine

## 2012-12-19 DIAGNOSIS — Z8701 Personal history of pneumonia (recurrent): Secondary | ICD-10-CM | POA: Insufficient documentation

## 2012-12-19 DIAGNOSIS — Z7982 Long term (current) use of aspirin: Secondary | ICD-10-CM | POA: Insufficient documentation

## 2012-12-19 DIAGNOSIS — G8929 Other chronic pain: Secondary | ICD-10-CM | POA: Insufficient documentation

## 2012-12-19 DIAGNOSIS — M545 Low back pain, unspecified: Secondary | ICD-10-CM | POA: Insufficient documentation

## 2012-12-19 DIAGNOSIS — Z8709 Personal history of other diseases of the respiratory system: Secondary | ICD-10-CM | POA: Insufficient documentation

## 2012-12-19 DIAGNOSIS — Z9889 Other specified postprocedural states: Secondary | ICD-10-CM | POA: Insufficient documentation

## 2012-12-19 DIAGNOSIS — Z79899 Other long term (current) drug therapy: Secondary | ICD-10-CM | POA: Insufficient documentation

## 2012-12-19 DIAGNOSIS — I2510 Atherosclerotic heart disease of native coronary artery without angina pectoris: Secondary | ICD-10-CM | POA: Insufficient documentation

## 2012-12-19 DIAGNOSIS — Z21 Asymptomatic human immunodeficiency virus [HIV] infection status: Secondary | ICD-10-CM | POA: Insufficient documentation

## 2012-12-19 DIAGNOSIS — Z8739 Personal history of other diseases of the musculoskeletal system and connective tissue: Secondary | ICD-10-CM | POA: Insufficient documentation

## 2012-12-19 DIAGNOSIS — Z8669 Personal history of other diseases of the nervous system and sense organs: Secondary | ICD-10-CM | POA: Insufficient documentation

## 2012-12-19 DIAGNOSIS — F17200 Nicotine dependence, unspecified, uncomplicated: Secondary | ICD-10-CM | POA: Insufficient documentation

## 2012-12-19 DIAGNOSIS — I10 Essential (primary) hypertension: Secondary | ICD-10-CM | POA: Insufficient documentation

## 2012-12-19 MED ORDER — HYDROMORPHONE HCL PF 2 MG/ML IJ SOLN
2.0000 mg | Freq: Once | INTRAMUSCULAR | Status: AC
  Administered 2012-12-19: 2 mg via INTRAMUSCULAR
  Filled 2012-12-19: qty 1

## 2012-12-19 MED ORDER — ONDANSETRON 8 MG PO TBDP
8.0000 mg | ORAL_TABLET | Freq: Once | ORAL | Status: AC
  Administered 2012-12-19: 8 mg via ORAL
  Filled 2012-12-19: qty 1

## 2012-12-19 NOTE — ED Notes (Signed)
Pt complains of "lower back pain" Pt states that this pain started again around 0200 this AM and now he is having a hard time walking due to the pain. Pt states that he was seen at [facility] and was "suppose to follow up with a pain clinic, but they never called me with the information"

## 2012-12-19 NOTE — ED Provider Notes (Signed)
History    CSN: [id number] Arrival date & time 12/19/12  1337 First MD Initiated Contact with Patient 12/19/12 1348      Chief Complaint  Patient presents with  . Back Pain    Patient is a 57 y.o. male presenting with back pain. The history is provided by the patient.  Back Pain Location:  Lumbar spine Quality:  Aching Radiates to:  Does not radiate Pain severity:  Severe Timing:  Constant Progression:  Worsening Chronicity:  Chronic Context: not falling and not recent injury   Worsened by:  Twisting Associated symptoms: no abdominal pain, no fever, no leg pain and no weakness    patient has been seen numerous times in the emergency room for trouble with his chronic back pain. He has been referred to a pain management center.  Past Medical History  Diagnosis Date  . HIV positive 2004  . Hypertension   . Meningitis   . Coronary artery disease   . Chest pain at rest 09/23/2012  . Chronic bronchitis     "q year last 5 yr or so" (09/24/2012)  . Exertional dyspnea   . Migraines   . Arthritis     "both shoulders" (09/24/2012)  . Chronic lower back pain   . Pneumonia     Past Surgical History  Procedure Laterality Date  . Intussusception repair  10/2011  . Posterior lumbar fusion  1995  . Elbow surgery  ~ 1997    "removed some stones; right" (09/24/2012)  . Appendectomy  2013  . Back surgery    . Abdominal surgery      No family history on file.  History  Substance Use Topics  . Smoking status: Current Every Day Smoker -- 0.50 packs/day for 45 years    Types: Cigarettes  . Smokeless tobacco: Never Used     Comment: 09/24/2012 "been stopped now ~ 8 month"  . Alcohol Use: No      Review of Systems  Constitutional: Negative for fever.  Gastrointestinal: Negative for abdominal pain.  Musculoskeletal: Positive for back pain.  Neurological: Negative for weakness.    Allergies  Flexeril; Hydrocodone; Nabumetone; [name]; Tramadol; Bee venom; and Other  Home  Medications   Current Outpatient Rx  Name  Route  Sig  Dispense  Refill  . albuterol (PROVENTIL HFA;VENTOLIN HFA) 108 (90 BASE) MCG/ACT inhaler   Inhalation   Inhale 2 puffs into the lungs every 6 (six) hours as needed for wheezing.         Marland Kitchen aspirin EC 81 MG tablet   Oral   Take 81 mg by mouth at bedtime.         . baclofen (LIORESAL) 10 MG tablet   Oral   Take 1 tablet (10 mg total) by mouth 3 (three) times daily.   30 tablet   0   . diazepam (VALIUM) 5 MG tablet   Oral   Take 5 mg by mouth every 6 (six) hours as needed for anxiety.          . lovastatin (MEVACOR) 20 MG tablet   Oral   Take 1 tablet (20 mg total) by mouth at bedtime.   30 tablet   0   . Multiple Vitamin (MULTIVITAMIN WITH MINERALS) TABS   Oral   Take 1 tablet by mouth daily.         . ramipril (ALTACE) 1.25 MG tablet   Oral   Take 0.625 mg by mouth 2 (two) times daily.         Marland Kitchen  doxycycline (VIBRA-TABS) 100 MG tablet   Oral   Take 100 mg by mouth 2 (two) times daily.         . Emtricitab-Rilpivir-Tenofovir 200-25-300 MG TABS   Oral   Take 1 tablet by mouth daily.   30 tablet   11   . sucralfate (CARAFATE) 1 G tablet   Oral   Take 1 tablet (1 g total) by mouth 4 (four) times daily.   120 tablet   0     BP 145/90  Pulse 85  Temp(Src) 98.7 F (37.1 C) (Oral)  Resp 20  SpO2 99%  Physical Exam  Nursing note and vitals reviewed. Constitutional: He appears well-developed and well-nourished.  HENT:  Head: Normocephalic and atraumatic.  Right Ear: External ear normal.  Left Ear: External ear normal.  Nose: Nose normal.  Eyes: Conjunctivae and EOM are normal.  Neck: Neck supple. No tracheal deviation present.  Pulmonary/Chest: Effort normal. No stridor. No respiratory distress.  Musculoskeletal: He exhibits no edema and no tenderness.       Lumbar back: He exhibits decreased range of motion, tenderness, pain and spasm. He exhibits no swelling and no edema.  Neurological:  He is alert. He is not disoriented. No cranial nerve deficit or sensory deficit. He exhibits normal muscle tone. Coordination normal.  Reflex Scores:      Patellar reflexes are 2+ on the right side and 2+ on the left side.      Achilles reflexes are 2+ on the right side and 2+ on the left side. Skin: Skin is warm and dry. No rash noted. He is not diaphoretic. No erythema.  Psychiatric: He has a normal mood and affect. His behavior is normal. Thought content normal.    ED Course  Procedures (including critical care time)  Labs Reviewed - No data to display No results found.   1. Chronic back pain       MDM  The patient has trouble with chronic back pain. I reviewed his old records. There is some concerns about the possibility of opiate addiction.  At this time, the patient does not have any evidence of any acute emergency medical condition associated with his back pain. there is no focal numbness or weakness. He does not have any fever to suggest infection. I recommended he followup with his pain management plan. He was given a dose of pain medications here in the emergency department       Celene Kras, MD 12/19/12 1534

## 2012-12-21 ENCOUNTER — Encounter: Payer: Self-pay | Admitting: Internal Medicine

## 2012-12-21 DIAGNOSIS — R269 Unspecified abnormalities of gait and mobility: Secondary | ICD-10-CM

## 2012-12-21 HISTORY — DX: Unspecified abnormalities of gait and mobility: R26.9

## 2012-12-22 ENCOUNTER — Emergency Department (HOSPITAL_COMMUNITY)
Admission: EM | Admit: 2012-12-22 | Discharge: 2012-12-22 | Disposition: A | Discharge status: Home / Self Care | Payer: Medicare Other | Attending: Emergency Medicine | Admitting: Emergency Medicine

## 2012-12-22 ENCOUNTER — Encounter (HOSPITAL_COMMUNITY): Payer: Self-pay | Admitting: *Deleted

## 2012-12-22 ENCOUNTER — Emergency Department (HOSPITAL_COMMUNITY): Payer: Medicare Other

## 2012-12-22 DIAGNOSIS — Z9889 Other specified postprocedural states: Secondary | ICD-10-CM | POA: Insufficient documentation

## 2012-12-22 DIAGNOSIS — Z8709 Personal history of other diseases of the respiratory system: Secondary | ICD-10-CM | POA: Insufficient documentation

## 2012-12-22 DIAGNOSIS — Z8701 Personal history of pneumonia (recurrent): Secondary | ICD-10-CM | POA: Insufficient documentation

## 2012-12-22 DIAGNOSIS — I10 Essential (primary) hypertension: Secondary | ICD-10-CM | POA: Insufficient documentation

## 2012-12-22 DIAGNOSIS — I2510 Atherosclerotic heart disease of native coronary artery without angina pectoris: Secondary | ICD-10-CM | POA: Insufficient documentation

## 2012-12-22 DIAGNOSIS — M545 Low back pain, unspecified: Secondary | ICD-10-CM | POA: Insufficient documentation

## 2012-12-22 DIAGNOSIS — F17200 Nicotine dependence, unspecified, uncomplicated: Secondary | ICD-10-CM | POA: Insufficient documentation

## 2012-12-22 DIAGNOSIS — I723 Aneurysm of iliac artery: Secondary | ICD-10-CM | POA: Insufficient documentation

## 2012-12-22 DIAGNOSIS — G8929 Other chronic pain: Secondary | ICD-10-CM | POA: Insufficient documentation

## 2012-12-22 DIAGNOSIS — Z8669 Personal history of other diseases of the nervous system and sense organs: Secondary | ICD-10-CM | POA: Insufficient documentation

## 2012-12-22 DIAGNOSIS — Z7982 Long term (current) use of aspirin: Secondary | ICD-10-CM | POA: Insufficient documentation

## 2012-12-22 DIAGNOSIS — IMO0002 Reserved for concepts with insufficient information to code with codable children: Secondary | ICD-10-CM | POA: Insufficient documentation

## 2012-12-22 DIAGNOSIS — Z8679 Personal history of other diseases of the circulatory system: Secondary | ICD-10-CM | POA: Insufficient documentation

## 2012-12-22 DIAGNOSIS — Z21 Asymptomatic human immunodeficiency virus [HIV] infection status: Secondary | ICD-10-CM | POA: Insufficient documentation

## 2012-12-22 DIAGNOSIS — M5116 Intervertebral disc disorders with radiculopathy, lumbar region: Secondary | ICD-10-CM

## 2012-12-22 DIAGNOSIS — M48061 Spinal stenosis, lumbar region without neurogenic claudication: Secondary | ICD-10-CM | POA: Insufficient documentation

## 2012-12-22 DIAGNOSIS — M129 Arthropathy, unspecified: Secondary | ICD-10-CM | POA: Insufficient documentation

## 2012-12-22 DIAGNOSIS — Z79899 Other long term (current) drug therapy: Secondary | ICD-10-CM | POA: Insufficient documentation

## 2012-12-22 MED ORDER — DIPHENHYDRAMINE HCL 25 MG PO CAPS
25.0000 mg | ORAL_CAPSULE | Freq: Once | ORAL | Status: AC
  Administered 2012-12-22: 25 mg via ORAL
  Filled 2012-12-22: qty 1

## 2012-12-22 MED ORDER — MORPHINE SULFATE 10 MG/ML IJ SOLN
10.0000 mg | Freq: Once | INTRAMUSCULAR | Status: AC
  Administered 2012-12-22: 10 mg via INTRAMUSCULAR
  Filled 2012-12-22: qty 1

## 2012-12-22 MED ORDER — ASPIRIN 81 MG PO CHEW: 81.0000 mg | CHEWABLE_TABLET | Freq: Every day | ORAL | Status: DC

## 2012-12-22 MED ORDER — DEXAMETHASONE SODIUM PHOSPHATE 10 MG/ML IJ SOLN
10.0000 mg | Freq: Once | INTRAMUSCULAR | Status: AC
  Administered 2012-12-22: 10 mg via INTRAMUSCULAR
  Filled 2012-12-22: qty 1

## 2012-12-22 MED ORDER — OXYCODONE-ACETAMINOPHEN 10-325 MG PO TABS: 1.0000 | ORAL_TABLET | ORAL | Status: DC | PRN

## 2012-12-22 MED ORDER — MELOXICAM 15 MG PO TABS: 15.0000 mg | ORAL_TABLET | Freq: Every day | ORAL | Status: DC

## 2012-12-22 NOTE — ED Notes (Signed)
Reports standing up at church and having sudden onset of bilateral lower back pains that radiates into upper back. Reports hx of back pain. Was seen at [facility] on 4/3 for same.

## 2012-12-22 NOTE — ED Notes (Signed)
Patient is alert and orientedx4.  Patient was explained discharge instructions and they understood them with no questions.  The patient's wife, Milburn Freeney, is taking the patient home.

## 2012-12-22 NOTE — ED Provider Notes (Signed)
History    This chart was scribed for non-physician practitioner working with Vida Roller, MD by Sofie Rower, ED Scribe. This patient was seen in room TR06C/TR06C and the patient's care was started at 3:05PM   CSN: [id number]  Arrival date & time 12/22/12  1419   First MD Initiated Contact with Patient 12/22/12 1505      Chief Complaint  Patient presents with  . Back Pain    (Consider location/radiation/quality/duration/timing/severity/associated sxs/prior treatment) Patient is a 57 y.o. male presenting with back pain. The history is provided by the patient. No language interpreter was used.  Back Pain Location:  Lumbar spine Quality:  Burning Radiates to:  Does not radiate Pain severity:  Moderate Pain is:  Same all the time Onset quality:  Sudden Duration:  6 hours Timing:  Constant Progression:  Worsening Chronicity:  Chronic Context comment:  Standing up this morning at church.  Relieved by:  Nothing Worsened by:  Movement Ineffective treatments:  None tried Associated symptoms: no abdominal pain, no abdominal swelling, no bladder incontinence, no bowel incontinence, no chest pain, no dysuria, no fever, no headaches, no leg pain, no numbness, no paresthesias, no pelvic pain, no tingling and no weight loss   Risk factors: no recent surgery     Kenneth [name] is a 57 y.o. male , with a hx of HIV, hypertension, migraines, and chronic lower back pain, who presents to the Emergency Department complaining of sudden, progressively worsening, back pain located at the lumbar region, onset today (12/22/12). The pt reports he was in church earlier this morning, when he stood up, and suddenly experienced a painful, snapping, burning, sensation within his lower back. The pt believes he may have reaggrivated his chronic back pain, which has prompted his concern and desire to seek medical evaluation at [facility] this afternoon. Modifying factors include certain movements and positions which intensify  the back pain.  Patient seen several days ago for chronic back pain, states that these are new symptoms.  The pt denies bladder and bowel incontinence, dysuria, fever, chills, nausea, and vomiting.   Furthermore, the pt denies any hx of recent back surgeries and IV drug use.    The pt is a current everyday smoker, however, he does not drink alcohol.   PCP is Dr. Collier Bullock.    Past Medical History  Diagnosis Date  . HIV positive 2004  . Hypertension   . Meningitis   . Coronary artery disease   . Chest pain at rest 09/23/2012  . Chronic bronchitis     "q year last 5 yr or so" (09/24/2012)  . Exertional dyspnea   . Migraines   . Arthritis     "both shoulders" (09/24/2012)  . Chronic lower back pain   . Pneumonia     Past Surgical History  Procedure Laterality Date  . Intussusception repair  10/2011  . Posterior lumbar fusion  1995  . Elbow surgery  ~ 1997    "removed some stones; right" (09/24/2012)  . Appendectomy  2013  . Back surgery    . Abdominal surgery      History reviewed. No pertinent family history.  History  Substance Use Topics  . Smoking status: Current Every Day Smoker -- 0.50 packs/day for 45 years    Types: Cigarettes  . Smokeless tobacco: Never Used     Comment: 09/24/2012 "been stopped now ~ 8 month"  . Alcohol Use: No      Review of Systems  Constitutional: Negative for fever,  chills and weight loss.  Cardiovascular: Negative for chest pain.  Gastrointestinal: Negative for nausea, vomiting, abdominal pain and bowel incontinence.  Genitourinary: Negative for bladder incontinence, dysuria, hematuria, difficulty urinating and pelvic pain.  Musculoskeletal: Positive for back pain.  Neurological: Negative for tingling, numbness, headaches and paresthesias.    Allergies  Flexeril; Hydrocodone; Nabumetone; [name]; Tramadol; Bee venom; and Other  Home Medications   Current Outpatient Rx  Name  Route  Sig  Dispense  Refill  . albuterol (PROVENTIL  HFA;VENTOLIN HFA) 108 (90 BASE) MCG/ACT inhaler   Inhalation   Inhale 2 puffs into the lungs every 6 (six) hours as needed for wheezing. For wheezing         . aspirin EC 81 MG tablet   Oral   Take 81 mg by mouth at bedtime.         . baclofen (LIORESAL) 10 MG tablet   Oral   Take 1 tablet (10 mg total) by mouth 3 (three) times daily.   30 tablet   0   . diazepam (VALIUM) 5 MG tablet   Oral   Take 5 mg by mouth every 6 (six) hours as needed for anxiety.          . Multiple Vitamin (MULTIVITAMIN WITH MINERALS) TABS   Oral   Take 1 tablet by mouth daily.         . ramipril (ALTACE) 1.25 MG tablet   Oral   Take 0.625 mg by mouth 2 (two) times daily.           BP 133/90  Pulse 93  Temp(Src) 97.5 F (36.4 C) (Oral)  Resp 14  SpO2 97%  Physical Exam  Nursing note and vitals reviewed. Constitutional: He is oriented to person, place, and time. He appears well-developed and well-nourished. No distress.  HENT:  Head: Normocephalic and atraumatic.  Eyes: EOM are normal.  Neck: Neck supple. No tracheal deviation present.  Cardiovascular: Normal rate, regular rhythm and normal heart sounds.  Exam reveals no gallop and no friction rub.   No murmur heard. Pulmonary/Chest: Effort normal and breath sounds normal. No respiratory distress. He has no wheezes.  Abdominal: Soft. He exhibits no distension. There is no tenderness.  Musculoskeletal: Normal range of motion.  Weakness with flexion of left foot. Good extension bilaterally. Good flexion of right foot. Hip flexion 5/5.   Neurological: He is alert and oriented to person, place, and time. No cranial nerve deficit.  Skin: Skin is warm and dry.  Psychiatric: He has a normal mood and affect. His behavior is normal.  the patient appears very uncomfortable on the examining table.  He is speaking in 3-4 word sentences.    ED Course  Procedures (including critical care time)  DIAGNOSTIC STUDIES: Oxygen Saturation is 97%  on room air, normal by my interpretation.    COORDINATION OF CARE:  3:42 PM- Treatment plan concerning radiologic evaluation discussed with patient. Pt agrees with treatment.      Labs Reviewed - No data to display   Ct Lumbar Spine Wo Contrast  12/22/2012  *RADIOLOGY REPORT*  Clinical Data: Chronic lower back pain with acute onset of worsening back pain located in the lumbar region upon standing. History of HIV and high blood pressure.  Four prior surgeries most recent 2008-09  CT LUMBAR SPINE WITHOUT CONTRAST  Technique:  Multidetector CT imaging of the lumbar spine was performed without intravenous contrast administration. Multiplanar CT image reconstructions were also generated.  Comparison: 06/04/2011 abdominal and  pelvic CT.  Findings: Last fully open disc space is labeled L5-S1.  Present examination incorporates from T11-12 disc space through the S1-S2 level.  Atherosclerotic type changes of the aorta and iliac arteries with mild ectasia of the iliac arteries. Right iliac artery measures up to 1.7 cm.  T11-12:  Minimal Schmorl's node deformity.  T12-L1: Minimal facet bony overgrowth.  L1-2:  Minimal facet bony overgrowth.  Minimal bulge.  L2-3:  Moderate facet joint degenerative changes.  Moderate bulge. Mild to slightly moderate spinal stenosis. Lateral extension of disc touches the exiting nerve roots.  L3-4:  Prior surgery suspected with attempted posterior element fusion.  Small portions of the left sided facet joint appear fused. Facet bony overgrowth.  Bulge with mild to slightly moderate spinal stenosis.  Right foraminal focal disc osteophyte with slight impression upon the exiting right L3 nerve root.  L4-5:  Moderate disc degeneration with disc space narrowing.  Facet joint degenerative changes.  Fusion left facet joint. Bulge/osteophyte greatest left posterior lateral position.  Mild spinal stenosis greater on the left. Moderate left lateral recess stenosis.  Left lateral extension of  disc/postoperative granulation tissue encroaches upon the exiting left L4 nerve root.  L5-S1:  Prior surgery with posterior element fusion greater on the right.  Bulge/osteophyte greatest left lateral position with slight encroachment upon but not significant progression of the exiting left L5 nerve root.  Mild left lateral recess stenosis.  IMPRESSION: Taken into account differences in technique, there has been no significant change in appearance of degenerative changes and postsurgical changes since the prior CT scan of the abdomen/ pelvis performed 06/04/2011 as detailed above.   Original Report Authenticated By: Lacy Duverney, M.D.           1. Lumbar disc disease with radiculopathy   2. Lumbar stenosis   3. Chronic back pain   4. Iliac artery aneurysm, right       MDM  5:45 PM BP 133/90  Pulse 93  Temp(Src) 97.5 F (36.4 C) (Oral)  Resp 14  SpO2 97% Patient with positive neuro finding of decreased flexion of left foot. I do not see any current imaging and have ordered ct imaging of the back.  Patient states that ambulation has become quite difficult due to pain. The patient has been evaluated previously in his hometown [place].    Patient with disc disease and radiculopathy.  Some spinal stenosis noted.  There appears to be a Right iliac artery aneurysm on CT scan.  I could not find note of that on previous records. Patient will be discharged with pain medication, antiinflammatories and follow up with both vascular surgery and neurosurgery.   I personally performed the services described in this documentation, which was scribed in my presence. The recorded information has been reviewed and is accurate.     Arthor Captain, PA-C 12/24/12 1256

## 2012-12-24 NOTE — ED Provider Notes (Signed)
Medical screening examination/treatment/procedure(s) were performed by non-physician practitioner and as supervising physician I was immediately available for consultation/collaboration.    Vida Roller, MD 12/24/12 2105

## 2012-12-25 ENCOUNTER — Encounter: Payer: Self-pay | Admitting: Vascular Surgery

## 2012-12-29 ENCOUNTER — Emergency Department (HOSPITAL_COMMUNITY): Payer: Medicare Other

## 2012-12-29 ENCOUNTER — Encounter (HOSPITAL_COMMUNITY): Payer: Self-pay

## 2012-12-29 ENCOUNTER — Emergency Department (HOSPITAL_COMMUNITY)
Admission: EM | Admit: 2012-12-29 | Discharge: 2012-12-29 | Disposition: A | Discharge status: Home / Self Care | Payer: Medicare Other | Attending: Emergency Medicine | Admitting: Emergency Medicine

## 2012-12-29 DIAGNOSIS — Z8701 Personal history of pneumonia (recurrent): Secondary | ICD-10-CM | POA: Insufficient documentation

## 2012-12-29 DIAGNOSIS — Z8661 Personal history of infections of the central nervous system: Secondary | ICD-10-CM | POA: Insufficient documentation

## 2012-12-29 DIAGNOSIS — Z8709 Personal history of other diseases of the respiratory system: Secondary | ICD-10-CM | POA: Insufficient documentation

## 2012-12-29 DIAGNOSIS — R509 Fever, unspecified: Secondary | ICD-10-CM | POA: Insufficient documentation

## 2012-12-29 DIAGNOSIS — G43909 Migraine, unspecified, not intractable, without status migrainosus: Secondary | ICD-10-CM | POA: Insufficient documentation

## 2012-12-29 DIAGNOSIS — Z21 Asymptomatic human immunodeficiency virus [HIV] infection status: Secondary | ICD-10-CM | POA: Insufficient documentation

## 2012-12-29 DIAGNOSIS — M542 Cervicalgia: Secondary | ICD-10-CM | POA: Insufficient documentation

## 2012-12-29 DIAGNOSIS — H53149 Visual discomfort, unspecified: Secondary | ICD-10-CM | POA: Insufficient documentation

## 2012-12-29 DIAGNOSIS — R079 Chest pain, unspecified: Secondary | ICD-10-CM | POA: Insufficient documentation

## 2012-12-29 DIAGNOSIS — Z8739 Personal history of other diseases of the musculoskeletal system and connective tissue: Secondary | ICD-10-CM | POA: Insufficient documentation

## 2012-12-29 DIAGNOSIS — M549 Dorsalgia, unspecified: Secondary | ICD-10-CM | POA: Insufficient documentation

## 2012-12-29 DIAGNOSIS — Z79899 Other long term (current) drug therapy: Secondary | ICD-10-CM | POA: Insufficient documentation

## 2012-12-29 DIAGNOSIS — F17200 Nicotine dependence, unspecified, uncomplicated: Secondary | ICD-10-CM | POA: Insufficient documentation

## 2012-12-29 DIAGNOSIS — Z8679 Personal history of other diseases of the circulatory system: Secondary | ICD-10-CM | POA: Insufficient documentation

## 2012-12-29 DIAGNOSIS — I2510 Atherosclerotic heart disease of native coronary artery without angina pectoris: Secondary | ICD-10-CM | POA: Insufficient documentation

## 2012-12-29 DIAGNOSIS — Z7982 Long term (current) use of aspirin: Secondary | ICD-10-CM | POA: Insufficient documentation

## 2012-12-29 DIAGNOSIS — R112 Nausea with vomiting, unspecified: Secondary | ICD-10-CM | POA: Insufficient documentation

## 2012-12-29 DIAGNOSIS — I10 Essential (primary) hypertension: Secondary | ICD-10-CM | POA: Insufficient documentation

## 2012-12-29 LAB — BASIC METABOLIC PANEL
BUN: 18 mg/dL (ref 6–23)
CO2: 27 mEq/L (ref 19–32)
Calcium: 9.5 mg/dL (ref 8.4–10.5)
Chloride: 106 mEq/L (ref 96–112)
Creatinine, Ser: 0.77 mg/dL (ref 0.50–1.35)
GFR calc Af Amer: >90 mL/min (ref >90)
GFR calc non Af Amer: >90 mL/min (ref >90)
Glucose, Bld: 81 mg/dL (ref 70–99)
Potassium: 3.8 mEq/L (ref 3.5–5.1)
Sodium: 139 mEq/L (ref 135–145)

## 2012-12-29 LAB — PROTEIN, CSF: Total  Protein, CSF: 35 mg/dL (ref 15–45)

## 2012-12-29 LAB — CBC
HCT: 42.4 % (ref 39.0–52.0)
Hemoglobin: 14.7 g/dL (ref 13.0–17.0)
MCH: 28.2 pg (ref 26.0–34.0)
MCHC: 34.7 g/dL (ref 30.0–36.0)
MCV: 81.2 fL (ref 78.0–100.0)
Platelets: 283 10*3/uL (ref 150–400)
RBC: 5.22 MIL/uL (ref 4.22–5.81)
RDW: 14.5 % (ref 11.5–15.5)
WBC: 8.1 10*3/uL (ref 4.0–10.5)

## 2012-12-29 LAB — CSF CELL COUNT WITH DIFFERENTIAL
RBC Count, CSF: 147 /mm3 — ABNORMAL HIGH
Tube #: 1
WBC, CSF: 2 /mm3 (ref 0–5)

## 2012-12-29 LAB — POCT I-STAT TROPONIN I: Troponin i, poc: 0.01 ng/mL (ref 0.00–0.08)

## 2012-12-29 LAB — GRAM STAIN

## 2012-12-29 LAB — GLUCOSE, CSF: Glucose, CSF: 64 mg/dL (ref 43–76)

## 2012-12-29 MED ORDER — SODIUM CHLORIDE 0.9 % IV BOLUS (SEPSIS)
1000.0000 mL | Freq: Once | INTRAVENOUS | Status: AC
  Administered 2012-12-29: 1000 mL via INTRAVENOUS

## 2012-12-29 MED ORDER — OXYCODONE-ACETAMINOPHEN 5-325 MG PO TABS: 1.0000 | ORAL_TABLET | Freq: Four times a day (QID) | ORAL | Status: DC | PRN

## 2012-12-29 MED ORDER — DIPHENHYDRAMINE HCL 50 MG/ML IJ SOLN
25.0000 mg | Freq: Once | INTRAMUSCULAR | Status: AC
  Administered 2012-12-29: 25 mg via INTRAVENOUS
  Filled 2012-12-29: qty 1

## 2012-12-29 MED ORDER — METOCLOPRAMIDE HCL 5 MG/ML IJ SOLN
10.0000 mg | Freq: Once | INTRAMUSCULAR | Status: AC
  Administered 2012-12-29: 10 mg via INTRAVENOUS
  Filled 2012-12-29: qty 2

## 2012-12-29 MED ORDER — MORPHINE SULFATE 4 MG/ML IJ SOLN
4.0000 mg | Freq: Once | INTRAMUSCULAR | Status: AC
  Administered 2012-12-29: 4 mg via INTRAVENOUS
  Filled 2012-12-29: qty 1

## 2012-12-29 NOTE — ED Notes (Signed)
Pt. Back from xray

## 2012-12-29 NOTE — ED Notes (Signed)
Pt. To xray for lumbar puncture

## 2012-12-29 NOTE — ED Provider Notes (Signed)
History     CSN: [id number]  Arrival date & time 12/29/12  0906   First MD Initiated Contact with Patient 12/29/12 0932      Chief Complaint  Patient presents with  . Headache    (Consider location/radiation/quality/duration/timing/severity/associated sxs/prior treatment) HPI Comments: 57 y/o male with a PMHx of HIV, HTN, meningitis, migraines, chronic low back pain and CAD presents to the ED complaining of gradual onset constant headache x 3 days. States this is similar to his normal migraines besides for the fact his migraines are usually intermittent and not constant. Describes the pain as throbbing rated 10/10 and is the "worse headache he has ever had". Admits to associated  Sparks in field of vision, photophobia, neck pain and stiffness, scalp tenderness, nausea and one episode of vomiting. Also complaining of chest pain beginning this morning along with lower back pain. Tried taking oxycodone at home without relief. He was recently hospitalized for the same about 3 weeks ago on 3/29. At that time he had a full cardiac workup along with CT, MRI/MRA of his head which were all unremarkable. He was diagnosed with a sebaceous cyst on his scalp causing scalp tenderness and was advised outpatient surgery follow up. Also counseled against cocaine use at that time. Denies any drug use at present.   Patient is a 57 y.o. male presenting with headaches. The history is provided by the patient.  Headache Associated symptoms: back pain, fever, nausea, neck pain, neck stiffness, photophobia and vomiting   Associated symptoms: no abdominal pain, no dizziness and no numbness     Past Medical History  Diagnosis Date  . HIV positive 2004  . Hypertension   . Meningitis   . Coronary artery disease   . Chest pain at rest 09/23/2012  . Chronic bronchitis     "q year last 5 yr or so" (09/24/2012)  . Exertional dyspnea   . Migraines   . Arthritis     "both shoulders" (09/24/2012)  . Chronic lower back  pain   . Pneumonia   . Lumbar disc disease   . Iliac artery aneurysm     Right    Past Surgical History  Procedure Laterality Date  . Intussusception repair  10/2011  . Posterior lumbar fusion  1995  . Elbow surgery  ~ 1997    "removed some stones; right" (09/24/2012)  . Appendectomy  2013  . Back surgery    . Abdominal surgery    . Spine surgery      Injury to back 1995    No family history on file.  History  Substance Use Topics  . Smoking status: Current Every Day Smoker -- 0.50 packs/day for 45 years    Types: Cigarettes  . Smokeless tobacco: Never Used     Comment: 09/24/2012 "been stopped now ~ 8 month"  . Alcohol Use: No      Review of Systems  Constitutional: Positive for fever.  HENT: Positive for neck pain and neck stiffness.   Eyes: Positive for photophobia and visual disturbance.  Respiratory: Negative for shortness of breath.   Cardiovascular: Positive for chest pain.  Gastrointestinal: Positive for nausea and vomiting. Negative for abdominal pain.  Musculoskeletal: Positive for back pain.  Neurological: Positive for headaches. Negative for dizziness, weakness and numbness.  Psychiatric/Behavioral: Negative for confusion.  All other systems reviewed and are negative.    Allergies  Flexeril; Hydrocodone; Nabumetone; [name]; Tramadol; Bee venom; and Other  Home Medications   Current Outpatient Rx  Name  Route  Sig  Dispense  Refill  . albuterol (PROVENTIL HFA;VENTOLIN HFA) 108 (90 BASE) MCG/ACT inhaler   Inhalation   Inhale 2 puffs into the lungs every 6 (six) hours as needed for wheezing. For wheezing         . aspirin EC 81 MG tablet   Oral   Take 81 mg by mouth at bedtime.         . baclofen (LIORESAL) 10 MG tablet   Oral   Take 1 tablet (10 mg total) by mouth 3 (three) times daily.   30 tablet   0   . diazepam (VALIUM) 5 MG tablet   Oral   Take 5 mg by mouth every 6 (six) hours as needed for anxiety.          . meloxicam  (MOBIC) 15 MG tablet   Oral   Take 15 mg by mouth daily.         . Multiple Vitamin (MULTIVITAMIN WITH MINERALS) TABS   Oral   Take 1 tablet by mouth daily.         Marland Kitchen oxyCODONE-acetaminophen (PERCOCET) 10-325 MG per tablet   Oral   Take 1 tablet by mouth every 4 (four) hours as needed for pain.   30 tablet   0   . ramipril (ALTACE) 1.25 MG tablet   Oral   Take 0.625 mg by mouth 2 (two) times daily.           BP 164/106  Temp(Src) 98 F (36.7 C) (Oral)  Resp 18  SpO2 99%  Physical Exam  Nursing note and vitals reviewed. Constitutional: He is oriented to person, place, and time. He appears well-developed and well-nourished.  Appears uncomfortable, sitting in room with light off.  HENT:  Head: Normocephalic and atraumatic.  Mouth/Throat: Oropharynx is clear and moist.  Generalized tenderness throughout scalp.  Eyes: Conjunctivae and EOM are normal. Pupils are equal, round, and reactive to light.  Neck: Normal range of motion. No rigidity. No edema present. No Brudzinski's sign and no Kernig's sign noted.  Full cervical ROM with pain in all directions. Tenderness to palpation of c-spine and paraspinal muscles bilateral.  Cardiovascular: Normal rate, regular rhythm, normal heart sounds and intact distal pulses.   Pulmonary/Chest: Effort normal and breath sounds normal. He has no decreased breath sounds. He has no wheezes. He has no rhonchi. He has no rales.  Abdominal: Soft. Bowel sounds are normal. There is no tenderness.  Neurological: He is alert and oriented to person, place, and time. He has normal strength. No cranial nerve deficit or sensory deficit. He displays a negative Romberg sign. Coordination and gait normal.  Symptoms worsened while standing.  Skin: Skin is warm and dry. No rash noted. He is not diaphoretic.  Psychiatric: He has a normal mood and affect. His behavior is normal.    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  No focal consolidation.  No pleural effusion or pneumothorax.  The heart is normal in size.  Visualized osseous structures are within normal limits.  IMPRESSION: No evidence of acute cardiopulmonary disease.   Original Report Authenticated By: Charline Bills, M.D.    Ct Head Wo Contrast  12/29/2012  *RADIOLOGY REPORT*  Clinical Data: Headache, migraines  CT HEAD WITHOUT CONTRAST  Technique:  Contiguous axial images were obtained from the base of the skull through the vertex without contrast.  Comparison: MRI brain dated 12/14/2012  Findings: No evidence of parenchymal hemorrhage or extra-axial fluid collection. No mass lesion, mass effect, or midline shift.  No CT evidence of acute infarction.  Mild subcortical white matter and periventricular small vessel ischemic changes.  Mild global cortical atrophy.  No ventriculomegaly.  The visualized paranasal sinuses are essentially clear. The mastoid air cells are unopacified.  Stable probable sebaceous cyst overlying the left parietal scalp (series 2/image 27).  No evidence of calvarial fracture.  IMPRESSION: No evidence of acute intracranial  abnormality.  Mild atrophy with small vessel ischemic changes.   Original Report Authenticated By: Charline Bills, M.D.    Dg Lumbar Puncture Fluoro Guide  12/29/2012  *RADIOLOGY REPORT*  Clinical Data: 57 year old with current history of HIV, presenting with headache, fever, and neck stiffness.  FLUOROSCOPICALLY GUIDED DIAGNOSTIC LUMBAR PUNCTURE 12/29/2012:  Procedure: Informed consent was obtained from the patient prior to the procedure.  Specific risks discussed included, but are not limited to:  Bleeding, with epidural hematoma possibly requiring surgical evacuation; introduction of infection; technical inability to obtain CSF; and post-procedure CSF leak requiring blood patch. The patient voiced understanding the risks and agreed to proceed.  The usual time out protocol was performed prior to the procedure. Using usual maximum barrier sterile technique and 1% Lidocaine as local anesthetic, a 20 gauge gauge spinal needle was introduced into the thecal sac at the L2-3 level using fluoroscopic guidance. Opening pressure measured approximately 13 cm H2O.  A total of approximately 7 ml of clear, colorless CSF were removed in four separate aliquots.  The fluid was sent to the laboratory for the requested studies.  The patient tolerated procedure well and there were no apparent immediate complications.  Fluoroscopy time: 48 seconds, pulsed fluoroscopy.  IMPRESSION: Uncomplicated diagnostic lumbar puncture under fluoroscopy as described. Opening pressure measured approximately 13 cm H2O.   Original Report Authenticated By: Hulan Saas, M.D.      1. Migraine       MDM  Concern for meningitis. Patient has had multiple back surgeries in the past and will need this done by IR. Obtaining head CT prior to ordering LP. Neuro exam unremarkable. Workup done at hospital stay 3/29 without any acute findings. EKG without changes. Making patient comfortable with morphine, reglan, benadryl and fluids. Case  discussed with Dr. Oletta Lamas who agrees with plan of care. 11:25 AM CT head and CXR without any acute abnormality. Dr. Oletta Lamas spoke with IR who will perform LP. 4:04 PM LP performed and patient does not have meningitis. He is feeling better after receiving morphine, reglan, benadryl and fluids for migraine. Cultures obtained from CSF. Patient has appt with his PCP in the morning. He  is stable for discharge. Very close return precautions discussed. Patient states understanding of plan and is agreeable.   Trevor Mace, PA-C 12/30/12 [phone]

## 2012-12-29 NOTE — ED Provider Notes (Signed)
Medical screening examination/treatment/procedure(s) were conducted as a shared visit with non-physician practitioner(s) and myself.  I personally evaluated the patient during the encounter   Pt with h/o HIV, meningitis, recent evaluation for chronic back pain, HA's, CP, recently admitted for same, had head CT and MRI of brain on 3.29.14.  Pt returns with same, had fever at home to 101 orally per family with no other obvious source of fever.  Pt with mild neck stiffness in the ED without overt abn Kernig's or Brudzinski's.  Will get LP under fluoro given h/o chronic back pain and multiple back surgeries in the past. Will also rule out for MI although fever and HA seems to be more of the issue.   Impression: HA CP H/o HIV and meningitis   Gavin Pound. Moustafa Mossa, MD 12/29/12 1121

## 2012-12-29 NOTE — ED Notes (Signed)
Pt. Reports "hurting all over" for the past 3 days.

## 2012-12-29 NOTE — ED Notes (Signed)
Pt. C/o intermittent chest pain, back pain, and a HA. Seen here recently for same. States took oxycodone last night but it didn't help.

## 2012-12-30 ENCOUNTER — Encounter: Payer: Self-pay | Admitting: Radiation Oncology

## 2012-12-30 ENCOUNTER — Ambulatory Visit (INDEPENDENT_AMBULATORY_CARE_PROVIDER_SITE_OTHER): Payer: Medicare Other | Admitting: Radiation Oncology

## 2012-12-30 VITALS — BP 143/99 | HR 76 | Temp 96.6°F | Ht 69.0 in | Wt 160.9 lb

## 2012-12-30 DIAGNOSIS — M545 Low back pain, unspecified: Secondary | ICD-10-CM

## 2012-12-30 DIAGNOSIS — I10 Essential (primary) hypertension: Secondary | ICD-10-CM

## 2012-12-30 DIAGNOSIS — R51 Headache: Secondary | ICD-10-CM

## 2012-12-30 DIAGNOSIS — I509 Heart failure, unspecified: Secondary | ICD-10-CM

## 2012-12-30 DIAGNOSIS — R519 Headache, unspecified: Secondary | ICD-10-CM | POA: Insufficient documentation

## 2012-12-30 DIAGNOSIS — G8929 Other chronic pain: Secondary | ICD-10-CM

## 2012-12-30 LAB — HERPES SIMPLEX VIRUS(HSV) DNA BY PCR
HSV 1 DNA: NOT DETECTED
HSV 2 DNA: NOT DETECTED

## 2012-12-30 LAB — HSV DNA BY PCR (REFERENCE LAB)

## 2012-12-30 LAB — VDRL, CSF: VDRL Quant, CSF: NONREACTIVE

## 2012-12-30 MED ORDER — RAMIPRIL 1.25 MG PO TABS: 2.5000 mg | ORAL_TABLET | Freq: Every day | ORAL | Status: DC

## 2012-12-30 NOTE — Assessment & Plan Note (Signed)
Unclear etiology--there are references to a hx of CAD in patient's medical records from [facility] that reference a prior nuclear stress test, however no details are available. No signs/symptoms of decompensated CHF at today's visit. Pt slightly hypertensive today, and given that he would likely benefit from higher ACE-i doses, will increase his ACE-i dose in response.  - increase ramipril to 2.5mg  QD - obtain nuclear stress test records  - f/u with PCP (appt on 01/16/2013)

## 2012-12-30 NOTE — Progress Notes (Signed)
Case discussed with Dr. Lavena Bullion at the time of the visit.  We reviewed the resident's history and exam and pertinent patient test results.  I agree with the assessment, diagnosis and plan of care documented in the resident's note.  We will be more aggressive in the management of his hypertension with ACEI which will also allow [place] to titrate up to target doses.  Although he is not a candidate for narcotics for his chronic back pain he may be a candidate for acetaminophen or NSAIDs or Physical Therapy in the future and this would be preferable to surgical intervention which may or may not be helpful symptomatically.

## 2012-12-30 NOTE — Progress Notes (Signed)
  Subjective:    Patient ID: Kenneth [name], male    DOB: 06-18-1956, 57 y.o.   MRN: [id number]  HPI Patient is a 57 year old man with PMH significant for HIV, CHF, migraine headache, who presents to clinic today for hospital followup. The patient had been admitted to the hospital on 12/14/2012 with complaints of chest pain. During the workup for this issue, it was discovered by 2-D echo that the patient has an EF of 40-45%. He was started on ramipril prior to discharge, however no beta blocker was initiated due to his history of cocaine abuse.  The patient has been seen in the ED several times since his discharge for complaints of pain at varying locations including back pain and neck pain, as well as headache. At today's visit, the patient admits that his back pain is chronic, and he would like referral to a pain center for further management of this issue with opioids, however he was informed that he would likely not the excepted to such a facility given his extensive history of polysubstance abuse. He states he also is in the process of trying to obtain an appointment with an orthopedic surgeon for further evaluation of his back pain  (states he had an appointment previously, but was unable to attend as they did not have his records on file).  The patient states that his recent headaches have been bilateral and associated with mild photophobia. He has a history of migraine headaches, and states that he believes that his recent headaches are also secondary to migraine, as they are of the exact same quality and severity as his typical migraine headaches.  He complains of dyspnea on exertion. He states that he has this symptom typically after walking >1 block, and that it is often associated with stabbing chest pain of mild/moderate severity. He states that he has had this issue for at least several months, and that his symptoms have been stable over that period of time. He denies any shortness of breath at  rest. He denies having any chest pain today.   His only other complaint today is R-sided neck pain, which is also chronic, and for which he is requesting muscle relaxants.   Review of Systems  All other systems reviewed and are negative.       Objective:   Physical Exam  Constitutional: He is oriented to person, place, and time. He appears well-developed and well-nourished. No distress.  HENT:  Head: Normocephalic and atraumatic.  Eyes: Conjunctivae are normal. Pupils are equal, round, and reactive to light. No scleral icterus.  Neck: Normal range of motion. Neck supple. No tracheal deviation present.  Cardiovascular: Normal rate and regular rhythm.   No murmur heard. Pulmonary/Chest: Effort normal. He has no wheezes. He has no rales.  Abdominal: Soft. Bowel sounds are normal. He exhibits no distension. There is no tenderness.  Musculoskeletal: Normal range of motion. He exhibits no edema.  Neck: Mild TTP of R paraspinal neck musculature. No TTP over cervical spine.   Back: Mild TTP at midline in lumbar area (L1-L5).   Neurological: He is alert and oriented to person, place, and time. No cranial nerve deficit.  Skin: Skin is warm and dry. No erythema.  Psychiatric: He has a normal mood and affect. His behavior is normal.          Assessment & Plan:

## 2012-12-30 NOTE — Assessment & Plan Note (Signed)
BP Readings from Last 3 Encounters:  12/30/12 143/99  12/29/12 140/98  12/22/12 133/90    Lab Results  Component Value Date   NA 139 12/29/2012   K 3.8 12/29/2012   CREATININE 0.77 12/29/2012    Assessment: Blood pressure control: mildly elevated Progress toward BP goal:  unchanged Comments:   Plan: Medications:  Increase ramipril to 2.5mg  QD Educational resources provided: brochure Self management tools provided:   Other plans:

## 2012-12-30 NOTE — Assessment & Plan Note (Signed)
Stable and unchanged. No red flag signs/symptoms. No UDS obtained as patient has an extensive history of polysubstance abuse and is not a candidate for opioids from the [facility].  - pt to f/u with orthopedics

## 2012-12-30 NOTE — Assessment & Plan Note (Addendum)
Patient's recent complaints of headache most consistent with migraine, of which he has a history. He has had an extensive and unrevealing workup for these headaches recently, including a CT head and MRI/MRA. He does however have a history of recurrent aseptic meningitis and HIV, which prompted an LP to be performed on his presentation to the ED yesterday with headache. Preliminary LP results are not suggestive of meningitis, and patient states his headache has resolved since his presentation to the ED. CSF culture, CSF-VDRL, and CSF-HSV all still pending however, which will require follow-up if any return positive.  - f/u CSF results

## 2012-12-30 NOTE — Patient Instructions (Addendum)
Please take your increased dose of ramipril. No other changes were made to your medications. Have a great day.

## 2012-12-31 ENCOUNTER — Encounter (HOSPITAL_COMMUNITY): Payer: Self-pay | Admitting: Nurse Practitioner

## 2012-12-31 ENCOUNTER — Ambulatory Visit (AMBULATORY_SURGERY_CENTER): Payer: Medicare Other

## 2012-12-31 ENCOUNTER — Emergency Department (HOSPITAL_COMMUNITY)
Admission: EM | Admit: 2012-12-31 | Discharge: 2012-12-31 | Disposition: A | Discharge status: Home / Self Care | Payer: Medicare Other | Attending: Emergency Medicine | Admitting: Emergency Medicine

## 2012-12-31 VITALS — Ht 69.0 in | Wt 161.2 lb

## 2012-12-31 DIAGNOSIS — Z981 Arthrodesis status: Secondary | ICD-10-CM | POA: Insufficient documentation

## 2012-12-31 DIAGNOSIS — I2510 Atherosclerotic heart disease of native coronary artery without angina pectoris: Secondary | ICD-10-CM | POA: Insufficient documentation

## 2012-12-31 DIAGNOSIS — Z1211 Encounter for screening for malignant neoplasm of colon: Secondary | ICD-10-CM

## 2012-12-31 DIAGNOSIS — M545 Low back pain, unspecified: Secondary | ICD-10-CM | POA: Insufficient documentation

## 2012-12-31 DIAGNOSIS — Z8679 Personal history of other diseases of the circulatory system: Secondary | ICD-10-CM | POA: Insufficient documentation

## 2012-12-31 DIAGNOSIS — R509 Fever, unspecified: Secondary | ICD-10-CM | POA: Insufficient documentation

## 2012-12-31 DIAGNOSIS — Z8661 Personal history of infections of the central nervous system: Secondary | ICD-10-CM | POA: Insufficient documentation

## 2012-12-31 DIAGNOSIS — M19019 Primary osteoarthritis, unspecified shoulder: Secondary | ICD-10-CM | POA: Insufficient documentation

## 2012-12-31 DIAGNOSIS — Z8701 Personal history of pneumonia (recurrent): Secondary | ICD-10-CM | POA: Insufficient documentation

## 2012-12-31 DIAGNOSIS — Z8709 Personal history of other diseases of the respiratory system: Secondary | ICD-10-CM | POA: Insufficient documentation

## 2012-12-31 DIAGNOSIS — G971 Other reaction to spinal and lumbar puncture: Secondary | ICD-10-CM

## 2012-12-31 DIAGNOSIS — Z79899 Other long term (current) drug therapy: Secondary | ICD-10-CM | POA: Insufficient documentation

## 2012-12-31 DIAGNOSIS — F17200 Nicotine dependence, unspecified, uncomplicated: Secondary | ICD-10-CM | POA: Insufficient documentation

## 2012-12-31 DIAGNOSIS — Z8739 Personal history of other diseases of the musculoskeletal system and connective tissue: Secondary | ICD-10-CM | POA: Insufficient documentation

## 2012-12-31 DIAGNOSIS — I10 Essential (primary) hypertension: Secondary | ICD-10-CM | POA: Insufficient documentation

## 2012-12-31 DIAGNOSIS — G8929 Other chronic pain: Secondary | ICD-10-CM | POA: Insufficient documentation

## 2012-12-31 DIAGNOSIS — Z7982 Long term (current) use of aspirin: Secondary | ICD-10-CM | POA: Insufficient documentation

## 2012-12-31 DIAGNOSIS — Z21 Asymptomatic human immunodeficiency virus [HIV] infection status: Secondary | ICD-10-CM | POA: Insufficient documentation

## 2012-12-31 MED ORDER — CAFFEINE-SODIUM BENZOATE 125-125 MG/ML IJ SOLN: 500.0000 mg | Freq: Once | INTRAMUSCULAR | Status: DC

## 2012-12-31 MED ORDER — DEXAMETHASONE SODIUM PHOSPHATE 10 MG/ML IJ SOLN
10.0000 mg | Freq: Once | INTRAMUSCULAR | Status: AC
  Administered 2012-12-31: 10 mg via INTRAVENOUS
  Filled 2012-12-31: qty 1

## 2012-12-31 MED ORDER — SODIUM CHLORIDE 0.9 % IV BOLUS (SEPSIS)
1000.0000 mL | Freq: Once | INTRAVENOUS | Status: AC
  Administered 2012-12-31: 1000 mL via INTRAVENOUS

## 2012-12-31 MED ORDER — BUTALBITAL-APAP-CAFFEINE 50-325-40 MG PO TABS: 1.0000 | ORAL_TABLET | Freq: Four times a day (QID) | ORAL | Status: DC | PRN

## 2012-12-31 MED ORDER — MOVIPREP 100 G PO SOLR: ORAL | Status: DC

## 2012-12-31 MED ORDER — DIPHENHYDRAMINE HCL 50 MG/ML IJ SOLN
25.0000 mg | Freq: Once | INTRAMUSCULAR | Status: AC
  Administered 2012-12-31: 25 mg via INTRAVENOUS
  Filled 2012-12-31: qty 1

## 2012-12-31 MED ORDER — METOCLOPRAMIDE HCL 5 MG/ML IJ SOLN
10.0000 mg | Freq: Once | INTRAMUSCULAR | Status: AC
  Administered 2012-12-31: 10 mg via INTRAVENOUS
  Filled 2012-12-31: qty 2

## 2012-12-31 NOTE — ED Notes (Signed)
Shad Ledvina, wife, [phone], [phone]

## 2012-12-31 NOTE — ED Provider Notes (Signed)
History     CSN: [id number]  Arrival date & time 12/31/12  1536   First MD Initiated Contact with Patient 12/31/12 1614      Chief Complaint  Patient presents with  . Headache    (Consider location/radiation/quality/duration/timing/severity/associated sxs/prior treatment) Patient is a 57 y.o. male presenting with headaches. The history is provided by the patient and medical records.  Headache Pain location:  Generalized Quality:  Sharp Radiates to:  Does not radiate Onset quality:  Gradual (worse when arising from a lying positional) Duration:  4 days Timing:  Constant Progression:  Waxing and waning Chronicity:  New Context: bright light   Context comment:  Had headache and neck stiffness 4 days ago; hx of HIV+; had LP that was negative for evidence of meningitis and head CT that was negative for masses Worsened by:  Activity (upright position) Ineffective treatments:  None tried Associated symptoms: back pain (chronic; similar to previous episodes of back pain) and fever   Associated symptoms: no abdominal pain, no cough, no diarrhea, no dizziness, no nausea, no neck pain, no numbness, no seizures and no vomiting   Risk factors comment:  Hx of HIV +   Past Medical History  Diagnosis Date  . HIV positive 2004  . Hypertension   . Meningitis   . Coronary artery disease   . Chest pain at rest 09/23/2012  . Chronic bronchitis     "q year last 5 yr or so" (09/24/2012)  . Exertional dyspnea   . Migraines   . Arthritis     "both shoulders" (09/24/2012)  . Chronic lower back pain   . Pneumonia   . Lumbar disc disease   . Iliac artery aneurysm     Right    Past Surgical History  Procedure Laterality Date  . Intussusception repair  10/2011  . Posterior lumbar fusion  1995  . Elbow surgery  ~ 1997    "removed some stones; right" (09/24/2012)  . Appendectomy  2013  . Back surgery  2006    4 BACK SURGERIES  . Abdominal surgery    . Spine surgery      Injury to back 1995     Family History  Problem Relation Age of Onset  . Heart disease Father   . Heart disease Brother   . Diabetes Brother     History  Substance Use Topics  . Smoking status: Current Every Day Smoker -- 0.10 packs/day for 45 years    Types: Cigarettes  . Smokeless tobacco: Never Used     Comment: 09/24/2012 "been stopped now ~ 8 month".  3 per day now.  . Alcohol Use: No      Review of Systems  Constitutional: Positive for fever and chills. Negative for diaphoresis.  HENT: Negative for neck pain.   Respiratory: Negative for cough, chest tightness, shortness of breath and wheezing.   Cardiovascular: Negative for chest pain and palpitations.  Gastrointestinal: Negative for nausea, vomiting, abdominal pain, diarrhea and constipation.  Musculoskeletal: Positive for back pain (chronic; similar to previous episodes of back pain). Negative for arthralgias.  Skin: Negative for rash and wound.  Neurological: Positive for headaches. Negative for dizziness, seizures, syncope, weakness, light-headedness and numbness.  All other systems reviewed and are negative.    Allergies  Flexeril; Hydrocodone; Nabumetone; [name]; Tramadol; Bee venom; and Other  Home Medications   Current Outpatient Rx  Name  Route  Sig  Dispense  Refill  . albuterol (PROVENTIL HFA;VENTOLIN HFA) 108 (90 BASE)  MCG/ACT inhaler   Inhalation   Inhale 2 puffs into the lungs every 6 (six) hours as needed for wheezing. For wheezing         . aspirin EC 81 MG tablet   Oral   Take 81 mg by mouth at bedtime.         . baclofen (LIORESAL) 10 MG tablet   Oral   Take 1 tablet (10 mg total) by mouth 3 (three) times daily.   30 tablet   0   . diazepam (VALIUM) 5 MG tablet   Oral   Take 5 mg by mouth every 6 (six) hours as needed for anxiety.          . meloxicam (MOBIC) 15 MG tablet   Oral   Take 15 mg by mouth daily.         Marland Kitchen MOVIPREP 100 G SOLR      Moviprep as directed / no substitutions   1 kit    0     Dispense as written.   . Multiple Vitamin (MULTIVITAMIN WITH MINERALS) TABS   Oral   Take 1 tablet by mouth daily.         Marland Kitchen oxyCODONE-acetaminophen (PERCOCET) 10-325 MG per tablet   Oral   Take 1 tablet by mouth every 4 (four) hours as needed for pain.   30 tablet   0   . oxyCODONE-acetaminophen (PERCOCET) 5-325 MG per tablet   Oral   Take 1-2 tablets by mouth every 6 (six) hours as needed for pain.   10 tablet   0   . ramipril (ALTACE) 1.25 MG tablet   Oral   Take 2 tablets (2.5 mg total) by mouth daily.   30 tablet   1     BP 171/110  Pulse 78  Temp(Src) 97.9 F (36.6 C) (Oral)  Resp 16  SpO2 98%  Physical Exam  Nursing note and vitals reviewed. Constitutional: He is oriented to person, place, and time. He appears well-developed and well-nourished.  HENT:  Head: Normocephalic and atraumatic.  Right Ear: External ear normal.  Left Ear: External ear normal.  Nose: Nose normal.  Mouth/Throat: Oropharynx is clear and moist. No oropharyngeal exudate.  Eyes: Conjunctivae are normal. Pupils are equal, round, and reactive to light.  Neck: Normal range of motion. Neck supple.  Cardiovascular: Normal rate, regular rhythm, normal heart sounds and intact distal pulses.   Pulmonary/Chest: Effort normal and breath sounds normal. No respiratory distress. He has no wheezes. He has no rales. He exhibits no tenderness.  Abdominal: Soft. Bowel sounds are normal. He exhibits no distension and no mass. There is no tenderness. There is no rebound and no guarding.  Musculoskeletal: Normal range of motion. He exhibits no edema and no tenderness.  Well-healed vertical scar overlying lumbar spine; mild TTP at location of scar from previous back surgeries   Neurological: He is alert and oriented to person, place, and time. He displays normal reflexes. No cranial nerve deficit. He exhibits normal muscle tone. Coordination normal.  Skin: Skin is warm and dry. No rash noted. No  erythema. No pallor.  Psychiatric: He has a normal mood and affect. His behavior is normal. Judgment and thought content normal.    ED Course  Procedures (including critical care time)  Labs Reviewed - No data to display No results found.   1. Postdural puncture headache       MDM  57 yo M w/hx of HIV presents for worsened headache since  LP 4 days ago. LP at that time for headache, neck stiffness and fever. CSF cell count, gram stain, culture not c/w meningitis. HSV and VDRL also negative. Head CT at that time negative for evidence of bleed and recent MRI (3/29) negative for evidence of tumor. AFVSS. Non-focal neuro exam. Clinical picture not c/w SAH, meningitis, or epidural spinal abscess or hematoma. Headache positional in nature; significantly worse when standing from lying position; suspect post-lumbar puncture headache. Caffeine ordered; however, pharmacy is currently out of caffeine. Migraine cocktail (decadron, benadryl, and Reglan) and IVF administered with significant improvement of headache. Repeat neuro exam remains non-focal and pt remains afebrile. Patient given prescription for PO Caffeine, instructions to f/u with Radiology for blood patch, and return precautions, including worsening of signs or symptoms. Patient instructed to follow-up with primary care physician.           Clemetine Marker, MD 12/31/12 [phone]

## 2012-12-31 NOTE — ED Notes (Signed)
Pt reports he was here Friday for headaches and had spinal tap to r/o meningitis. Reports headache has remained since and is worse today. Describes as "sharp stabbing from eyes to top of my head." feels dizzy when he stands but no LOC. A&Ox4.

## 2012-12-31 NOTE — ED Provider Notes (Signed)
I saw and evaluated the patient, reviewed the resident's note and I agree with the findings and plan.  Gradual onset headache all day today right-sided worse whenever he sits or stands up without vertigo altered mental status or new focal neurologic symptoms such as change in speech vision localized weakness or numbness or incoordination. He recently had MRI, CT scan, and lumbar puncture for headache evaluation.  Kenneth Horn, MD 01/03/13 [phone]

## 2013-01-01 ENCOUNTER — Emergency Department (HOSPITAL_COMMUNITY): Payer: Medicare Other

## 2013-01-01 ENCOUNTER — Emergency Department (HOSPITAL_COMMUNITY)
Admission: EM | Admit: 2013-01-01 | Discharge: 2013-01-01 | Disposition: A | Discharge status: Home / Self Care | Payer: Medicare Other | Attending: Emergency Medicine | Admitting: Emergency Medicine

## 2013-01-01 ENCOUNTER — Other Ambulatory Visit: Payer: Self-pay | Admitting: *Deleted

## 2013-01-01 ENCOUNTER — Encounter (HOSPITAL_COMMUNITY): Payer: Self-pay | Admitting: *Deleted

## 2013-01-01 DIAGNOSIS — Z79899 Other long term (current) drug therapy: Secondary | ICD-10-CM | POA: Insufficient documentation

## 2013-01-01 DIAGNOSIS — Z7982 Long term (current) use of aspirin: Secondary | ICD-10-CM | POA: Insufficient documentation

## 2013-01-01 DIAGNOSIS — Z8709 Personal history of other diseases of the respiratory system: Secondary | ICD-10-CM | POA: Insufficient documentation

## 2013-01-01 DIAGNOSIS — M545 Low back pain, unspecified: Secondary | ICD-10-CM | POA: Insufficient documentation

## 2013-01-01 DIAGNOSIS — Z21 Asymptomatic human immunodeficiency virus [HIV] infection status: Secondary | ICD-10-CM | POA: Insufficient documentation

## 2013-01-01 DIAGNOSIS — R51 Headache: Secondary | ICD-10-CM

## 2013-01-01 DIAGNOSIS — I10 Essential (primary) hypertension: Secondary | ICD-10-CM | POA: Insufficient documentation

## 2013-01-01 DIAGNOSIS — Z8739 Personal history of other diseases of the musculoskeletal system and connective tissue: Secondary | ICD-10-CM | POA: Insufficient documentation

## 2013-01-01 DIAGNOSIS — Z8679 Personal history of other diseases of the circulatory system: Secondary | ICD-10-CM | POA: Insufficient documentation

## 2013-01-01 DIAGNOSIS — Z8701 Personal history of pneumonia (recurrent): Secondary | ICD-10-CM | POA: Insufficient documentation

## 2013-01-01 DIAGNOSIS — G971 Other reaction to spinal and lumbar puncture: Secondary | ICD-10-CM

## 2013-01-01 DIAGNOSIS — Z8661 Personal history of infections of the central nervous system: Secondary | ICD-10-CM | POA: Insufficient documentation

## 2013-01-01 DIAGNOSIS — G8929 Other chronic pain: Secondary | ICD-10-CM | POA: Insufficient documentation

## 2013-01-01 DIAGNOSIS — F17200 Nicotine dependence, unspecified, uncomplicated: Secondary | ICD-10-CM | POA: Insufficient documentation

## 2013-01-01 DIAGNOSIS — I2510 Atherosclerotic heart disease of native coronary artery without angina pectoris: Secondary | ICD-10-CM | POA: Insufficient documentation

## 2013-01-01 MED ORDER — SODIUM CHLORIDE 0.9 % IV SOLN: INTRAVENOUS | Status: DC

## 2013-01-01 MED ORDER — SODIUM CHLORIDE 0.9 % IV BOLUS (SEPSIS)
1000.0000 mL | Freq: Once | INTRAVENOUS | Status: AC
  Administered 2013-01-01: 1000 mL via INTRAVENOUS

## 2013-01-01 MED ORDER — DIPHENHYDRAMINE HCL 50 MG/ML IJ SOLN
25.0000 mg | Freq: Once | INTRAMUSCULAR | Status: AC
  Administered 2013-01-01: 25 mg via INTRAVENOUS
  Filled 2013-01-01: qty 1

## 2013-01-01 MED ORDER — HYDROMORPHONE HCL PF 1 MG/ML IJ SOLN
1.0000 mg | Freq: Once | INTRAMUSCULAR | Status: AC
  Administered 2013-01-01: 1 mg via INTRAVENOUS
  Filled 2013-01-01: qty 1

## 2013-01-01 MED ORDER — METOCLOPRAMIDE HCL 5 MG/ML IJ SOLN
10.0000 mg | Freq: Once | INTRAMUSCULAR | Status: AC
  Administered 2013-01-01: 10 mg via INTRAVENOUS
  Filled 2013-01-01: qty 2

## 2013-01-01 NOTE — ED Notes (Signed)
Pt waiting in room while lying flat. Called wife to inquire about when she will arrive, stating she is on her way.

## 2013-01-01 NOTE — ED Notes (Signed)
No acute neuro deficits. C/o "shooting, sharp" headache. Recently seen her in the ER for same.

## 2013-01-01 NOTE — ED Notes (Signed)
Tali Coster (wife) [phone]

## 2013-01-01 NOTE — ED Notes (Signed)
Patient transported to IR 

## 2013-01-01 NOTE — ED Provider Notes (Signed)
History     CSN: [id number]  Arrival date & time 01/01/13  1202   First MD Initiated Contact with Patient 01/01/13 1223      Chief Complaint  Patient presents with  . Headache    (Consider location/radiation/quality/duration/timing/severity/associated sxs/prior treatment) Patient is a 57 y.o. [name] presenting with headaches. The history is provided by the patient.  Headache  patient here complaining of headache consistent with a post lumbar puncture headache. Seen yesterday for same and was scheduled to have a blood patch performed in the next one to 2 days. Headache has increased and localized to the frontal region of the skull. Denies any neck pain or photophobia. No fever or chills. Pain is positional and nothing makes it better. Use his home medications without relief  Past Medical History  Diagnosis Date  . HIV positive 2004  . Hypertension   . Meningitis   . Coronary artery disease   . Chest pain at rest 09/23/2012  . Chronic bronchitis     "q year last 5 yr or so" (09/24/2012)  . Exertional dyspnea   . Migraines   . Arthritis     "both shoulders" (09/24/2012)  . Chronic lower back pain   . Pneumonia   . Lumbar disc disease   . Iliac artery aneurysm     Right    Past Surgical History  Procedure Laterality Date  . Intussusception repair  10/2011  . Posterior lumbar fusion  1995  . Elbow surgery  ~ 1997    "removed some stones; right" (09/24/2012)  . Appendectomy  2013  . Back surgery  2006    4 BACK SURGERIES  . Abdominal surgery    . Spine surgery      Injury to back 1995    Family History  Problem Relation Age of Onset  . Heart disease Father   . Heart disease Brother   . Diabetes Brother     History  Substance Use Topics  . Smoking status: Current Every Day Smoker -- 0.10 packs/day for 45 years    Types: Cigarettes  . Smokeless tobacco: Never Used     Comment: 09/24/2012 "been stopped now ~ 8 month".  3 per day now.  . Alcohol Use: No      Review of  Systems  Neurological: Positive for headaches.  All other systems reviewed and are negative.    Allergies  Flexeril; Hydrocodone; Nabumetone; [name]; Tramadol; Bee venom; and Other  Home Medications   Current Outpatient Rx  Name  Route  Sig  Dispense  Refill  . acetaminophen (TYLENOL) 325 MG tablet   Oral   Take 650 mg by mouth 2 (two) times daily as needed for pain.         Marland Kitchen albuterol (PROVENTIL HFA;VENTOLIN HFA) 108 (90 BASE) MCG/ACT inhaler   Inhalation   Inhale 2 puffs into the lungs every 6 (six) hours as needed for wheezing or shortness of breath.          Marland Kitchen aspirin EC 81 MG tablet   Oral   Take 81 mg by mouth daily.          . baclofen (LIORESAL) 10 MG tablet   Oral   Take 1 tablet (10 mg total) by mouth 3 (three) times daily.   30 tablet   0   . butalbital-acetaminophen-caffeine (FIORICET) 50-325-40 MG per tablet   Oral   Take 1 tablet by mouth every 6 (six) hours as needed for pain or headache.  Kenneth tablet   0   . diazepam (VALIUM) 5 MG tablet   Oral   Take 5 mg by mouth 2 (two) times daily as needed for anxiety.          . meloxicam (MOBIC) 15 MG tablet   Oral   Take 15 mg by mouth daily.         . Multiple Vitamin (MULTIVITAMIN WITH MINERALS) TABS   Oral   Take 1 tablet by mouth daily.         Marland Kitchen oxyCODONE-acetaminophen (PERCOCET) 5-325 MG per tablet   Oral   Take 1-2 tablets by mouth every 6 (six) hours as needed for pain.   10 tablet   0   . ramipril (ALTACE) 10 MG tablet   Oral   Take 5 mg by mouth 2 (two) times daily.           BP 151/100  Pulse 92  Temp(Src) 98 F (36.7 C) (Oral)  Resp Kenneth  SpO2 99%  Physical Exam  Nursing note and vitals reviewed. Constitutional: He is oriented to person, place, and time. He appears well-developed and well-nourished.  Non-toxic appearance. No distress.  HENT:  Head: Normocephalic and atraumatic.  Eyes: Conjunctivae, EOM and lids are normal. Pupils are equal, round, and reactive  to light.  Neck: Normal range of motion. Neck supple. No tracheal deviation present. No mass present.  Cardiovascular: Normal rate, regular rhythm and normal heart sounds.  Exam reveals no gallop.   No murmur heard. Pulmonary/Chest: Effort normal and breath sounds normal. No stridor. No respiratory distress. He has no decreased breath sounds. He has no wheezes. He has no rhonchi. He has no rales.  Abdominal: Soft. Normal appearance and bowel sounds are normal. He exhibits no distension. There is no tenderness. There is no rebound and no CVA tenderness.  Musculoskeletal: Normal range of motion. He exhibits no edema and no tenderness.  Neurological: He is alert and oriented to person, place, and time. He has normal strength. No cranial nerve deficit or sensory deficit. GCS eye subscore is 4. GCS verbal subscore is 5. GCS motor subscore is 6.  Skin: Skin is warm and dry. No abrasion and no rash noted.  Psychiatric: He has a normal mood and affect. His speech is normal and behavior is normal.    ED Course  Procedures (including critical care time)  Labs Reviewed - No data to display No results found.   No diagnosis found.    MDM  Spoke with radiology and pt to have blood patch        Toy Baker, MD 01/01/13 [phone]

## 2013-01-01 NOTE — ED Notes (Signed)
Pt was seen here yesterday and tx for spinal ha.  Pt called for follow up apt and was told they could see him on Mon and he cannot wait that long.

## 2013-01-01 NOTE — ED Notes (Signed)
Pt wife calling stating she is almost here. Pt to be discharged. Pt requesting rx for pain medication. Requesting from EDP stating pt given rx yesterday as well as on 4/13. No rx given, pt needs to follow up with PCP or pain management clinic.

## 2013-01-02 ENCOUNTER — Other Ambulatory Visit: Payer: Medicare Other

## 2013-01-02 LAB — CSF CULTURE: Gram Stain: NONE SEEN

## 2013-01-02 LAB — CSF CULTURE W GRAM STAIN: Culture: NO GROWTH

## 2013-01-04 ENCOUNTER — Emergency Department (HOSPITAL_COMMUNITY)
Admission: EM | Admit: 2013-01-04 | Discharge: 2013-01-04 | Disposition: A | Discharge status: Home / Self Care | Payer: Medicare Other | Attending: Emergency Medicine | Admitting: Emergency Medicine

## 2013-01-04 DIAGNOSIS — Z8709 Personal history of other diseases of the respiratory system: Secondary | ICD-10-CM | POA: Insufficient documentation

## 2013-01-04 DIAGNOSIS — M5416 Radiculopathy, lumbar region: Secondary | ICD-10-CM

## 2013-01-04 DIAGNOSIS — Z8679 Personal history of other diseases of the circulatory system: Secondary | ICD-10-CM | POA: Insufficient documentation

## 2013-01-04 DIAGNOSIS — M545 Low back pain, unspecified: Secondary | ICD-10-CM | POA: Insufficient documentation

## 2013-01-04 DIAGNOSIS — F17200 Nicotine dependence, unspecified, uncomplicated: Secondary | ICD-10-CM | POA: Insufficient documentation

## 2013-01-04 DIAGNOSIS — Z791 Long term (current) use of non-steroidal anti-inflammatories (NSAID): Secondary | ICD-10-CM | POA: Insufficient documentation

## 2013-01-04 DIAGNOSIS — Z981 Arthrodesis status: Secondary | ICD-10-CM | POA: Insufficient documentation

## 2013-01-04 DIAGNOSIS — M19019 Primary osteoarthritis, unspecified shoulder: Secondary | ICD-10-CM | POA: Insufficient documentation

## 2013-01-04 DIAGNOSIS — Z7982 Long term (current) use of aspirin: Secondary | ICD-10-CM | POA: Insufficient documentation

## 2013-01-04 DIAGNOSIS — G971 Other reaction to spinal and lumbar puncture: Secondary | ICD-10-CM

## 2013-01-04 DIAGNOSIS — IMO0002 Reserved for concepts with insufficient information to code with codable children: Secondary | ICD-10-CM | POA: Insufficient documentation

## 2013-01-04 DIAGNOSIS — Z79899 Other long term (current) drug therapy: Secondary | ICD-10-CM | POA: Insufficient documentation

## 2013-01-04 DIAGNOSIS — Z8739 Personal history of other diseases of the musculoskeletal system and connective tissue: Secondary | ICD-10-CM | POA: Insufficient documentation

## 2013-01-04 DIAGNOSIS — I2510 Atherosclerotic heart disease of native coronary artery without angina pectoris: Secondary | ICD-10-CM | POA: Insufficient documentation

## 2013-01-04 DIAGNOSIS — I10 Essential (primary) hypertension: Secondary | ICD-10-CM

## 2013-01-04 DIAGNOSIS — Z8701 Personal history of pneumonia (recurrent): Secondary | ICD-10-CM | POA: Insufficient documentation

## 2013-01-04 DIAGNOSIS — Z21 Asymptomatic human immunodeficiency virus [HIV] infection status: Secondary | ICD-10-CM | POA: Insufficient documentation

## 2013-01-04 DIAGNOSIS — Z8661 Personal history of infections of the central nervous system: Secondary | ICD-10-CM | POA: Insufficient documentation

## 2013-01-04 DIAGNOSIS — G8929 Other chronic pain: Secondary | ICD-10-CM | POA: Insufficient documentation

## 2013-01-04 DIAGNOSIS — Z9889 Other specified postprocedural states: Secondary | ICD-10-CM | POA: Insufficient documentation

## 2013-01-04 MED ORDER — OXYCODONE-ACETAMINOPHEN 5-325 MG PO TABS
2.0000 | ORAL_TABLET | Freq: Once | ORAL | Status: AC
  Administered 2013-01-04: 2 via ORAL
  Filled 2013-01-04: qty 2

## 2013-01-04 NOTE — ED Provider Notes (Signed)
History     CSN: [id number]  Arrival date & time 01/04/13  1949   First MD Initiated Contact with Patient 01/04/13 2056      Chief Complaint  Patient presents with  . Back Pain    (Consider location/radiation/quality/duration/timing/severity/associated sxs/prior treatment) Patient is a 57 y.o. male presenting with back pain. The history is provided by the patient and the EMS personnel.  Back Pain Location:  Lumbar spine Quality:  Stabbing Radiates to:  R knee and L knee Pain severity:  Moderate Onset quality:  Gradual Duration: Years. Timing:  Intermittent Progression:  Waxing and waning Chronicity:  Chronic Associated symptoms: no abdominal pain, no chest pain, no fever, no numbness and no weakness     Past Medical History  Diagnosis Date  . HIV positive 2004  . Hypertension   . Meningitis   . Coronary artery disease   . Chest pain at rest 09/23/2012  . Chronic bronchitis     "q year last 5 yr or so" (09/24/2012)  . Exertional dyspnea   . Migraines   . Arthritis     "both shoulders" (09/24/2012)  . Chronic lower back pain   . Pneumonia   . Lumbar disc disease   . Iliac artery aneurysm     Right    Past Surgical History  Procedure Laterality Date  . Intussusception repair  10/2011  . Posterior lumbar fusion  1995  . Elbow surgery  ~ 1997    "removed some stones; right" (09/24/2012)  . Appendectomy  2013  . Back surgery  2006    4 BACK SURGERIES  . Abdominal surgery    . Spine surgery      Injury to back 1995    Family History  Problem Relation Age of Onset  . Heart disease Father   . Heart disease Brother   . Diabetes Brother     History  Substance Use Topics  . Smoking status: Current Every Day Smoker -- 0.10 packs/day for 45 years    Types: Cigarettes  . Smokeless tobacco: Never Used     Comment: 09/24/2012 "been stopped now ~ 8 month".  3 per day now.  . Alcohol Use: No      Review of Systems  Constitutional: Negative for fever, chills,  activity change and appetite change.  HENT: Negative for neck pain.   Respiratory: Negative for cough, chest tightness, shortness of breath and wheezing.   Cardiovascular: Negative for chest pain and palpitations.  Gastrointestinal: Negative for nausea, vomiting, abdominal pain, diarrhea and constipation.  Genitourinary: Negative for decreased urine volume and difficulty urinating.  Musculoskeletal: Positive for back pain.  Skin: Negative for rash and wound.  Neurological: Negative for dizziness, weakness, light-headedness and numbness.  All other systems reviewed and are negative.    Allergies  Flexeril; Hydrocodone; Nabumetone; [name]; Tramadol; Bee venom; and Other  Home Medications   Current Outpatient Rx  Name  Route  Sig  Dispense  Refill  . acetaminophen (TYLENOL) 325 MG tablet   Oral   Take 650 mg by mouth 2 (two) times daily as needed for pain.         Marland Kitchen albuterol (PROVENTIL HFA;VENTOLIN HFA) 108 (90 BASE) MCG/ACT inhaler   Inhalation   Inhale 2 puffs into the lungs every 6 (six) hours as needed for wheezing or shortness of breath.          Marland Kitchen aspirin EC 81 MG tablet   Oral   Take 81 mg by mouth daily.          Marland Kitchen  baclofen (LIORESAL) 10 MG tablet   Oral   Take 1 tablet (10 mg total) by mouth 3 (three) times daily.   30 tablet   0   . butalbital-acetaminophen-caffeine (FIORICET) 50-325-40 MG per tablet   Oral   Take 1 tablet by mouth every 6 (six) hours as needed for pain or headache.   20 tablet   0   . diazepam (VALIUM) 5 MG tablet   Oral   Take 5 mg by mouth 2 (two) times daily as needed for anxiety.          . meloxicam (MOBIC) 15 MG tablet   Oral   Take 15 mg by mouth daily.         . Multiple Vitamin (MULTIVITAMIN WITH MINERALS) TABS   Oral   Take 1 tablet by mouth daily.         Marland Kitchen oxyCODONE-acetaminophen (PERCOCET) 5-325 MG per tablet   Oral   Take 1-2 tablets by mouth every 6 (six) hours as needed for pain.   10 tablet   0   .  ramipril (ALTACE) 10 MG tablet   Oral   Take 5 mg by mouth 2 (two) times daily.           BP 204/118  Pulse 95  Temp(Src) 97.9 F (36.6 C) (Oral)  Resp 20  SpO2 96%  Physical Exam  Nursing note and vitals reviewed. Constitutional: He appears well-developed and well-nourished.  HENT:  Head: Normocephalic and atraumatic.  Right Ear: External ear normal.  Left Ear: External ear normal.  Nose: Nose normal.  Mouth/Throat: Oropharynx is clear and moist. No oropharyngeal exudate.  Eyes: Conjunctivae are normal. Pupils are equal, round, and reactive to light.  Neck: Normal range of motion. Neck supple.  Cardiovascular: Normal rate, regular rhythm, normal heart sounds and intact distal pulses.   Pulmonary/Chest: Effort normal and breath sounds normal. No respiratory distress. He has no wheezes. He has no rales. He exhibits no tenderness.  Abdominal: Soft. Bowel sounds are normal. He exhibits no distension and no mass. There is no tenderness. There is no rebound and no guarding.  Genitourinary: Rectum normal.  Musculoskeletal: Normal range of motion. He exhibits no edema and no tenderness.  Well-healed vertical scar overlying lumbar spine  Neurological: He is alert. He displays normal reflexes. No cranial nerve deficit. He exhibits normal muscle tone. Coordination normal.  Skin: Skin is warm and dry. No rash noted. No erythema. No pallor.  Psychiatric: He has a normal mood and affect. His behavior is normal. Judgment and thought content normal.    ED Course  Procedures (including critical care time)  Labs Reviewed - No data to display No results found.   1. Chronic low back pain   2. Postdural puncture headache   3. Lumbar radiculopathy   4. Hypertension       MDM  57 yo M w/hx of chronic low back pain s/p multiple low back surgeries presents for exacerbation of chronic low back pain radiating down bilateral legs. Pt with LP last week for headache; presented for post LP  headache and had blood patch placed 2 days ago with improvement of headache. No urinary hesitancy or fecal incontinence. No saddle anesthesia. Normal rectal tone. No fever/chills. Doubt epidural abscess or cauda equina syndrome. BP elevated on arrival, likely secondary to pain. Pt denies abdominal pain and back pain reproducible with palpation. Clinical picture not concerning for AAA. Analgesia administered with improvement of pain and blood pressure (204/118 on  arrival; 157/110 at discharge). Pt counseled to follow up with PCP regarding chronic back pain.         Clemetine Marker, MD 01/04/13 2253

## 2013-01-04 NOTE — ED Notes (Signed)
Pt c/o chronic back pain. Was seen a week ago for same complaint. States that his back pain radiates to his hips and down both legs. BP elevated.

## 2013-01-06 DIAGNOSIS — I2510 Atherosclerotic heart disease of native coronary artery without angina pectoris: Secondary | ICD-10-CM | POA: Insufficient documentation

## 2013-01-06 DIAGNOSIS — L723 Sebaceous cyst: Secondary | ICD-10-CM | POA: Insufficient documentation

## 2013-01-06 DIAGNOSIS — Z8679 Personal history of other diseases of the circulatory system: Secondary | ICD-10-CM | POA: Insufficient documentation

## 2013-01-06 DIAGNOSIS — Z7982 Long term (current) use of aspirin: Secondary | ICD-10-CM | POA: Insufficient documentation

## 2013-01-06 DIAGNOSIS — Z8661 Personal history of infections of the central nervous system: Secondary | ICD-10-CM | POA: Insufficient documentation

## 2013-01-06 DIAGNOSIS — I10 Essential (primary) hypertension: Secondary | ICD-10-CM | POA: Insufficient documentation

## 2013-01-06 DIAGNOSIS — Z8709 Personal history of other diseases of the respiratory system: Secondary | ICD-10-CM | POA: Insufficient documentation

## 2013-01-06 DIAGNOSIS — Z79899 Other long term (current) drug therapy: Secondary | ICD-10-CM | POA: Insufficient documentation

## 2013-01-06 DIAGNOSIS — R51 Headache: Secondary | ICD-10-CM | POA: Insufficient documentation

## 2013-01-06 DIAGNOSIS — G8929 Other chronic pain: Secondary | ICD-10-CM | POA: Insufficient documentation

## 2013-01-06 DIAGNOSIS — Z8669 Personal history of other diseases of the nervous system and sense organs: Secondary | ICD-10-CM | POA: Insufficient documentation

## 2013-01-06 DIAGNOSIS — M545 Low back pain, unspecified: Secondary | ICD-10-CM | POA: Insufficient documentation

## 2013-01-06 DIAGNOSIS — Z8739 Personal history of other diseases of the musculoskeletal system and connective tissue: Secondary | ICD-10-CM | POA: Insufficient documentation

## 2013-01-06 DIAGNOSIS — Z21 Asymptomatic human immunodeficiency virus [HIV] infection status: Secondary | ICD-10-CM | POA: Insufficient documentation

## 2013-01-06 DIAGNOSIS — Z8701 Personal history of pneumonia (recurrent): Secondary | ICD-10-CM | POA: Insufficient documentation

## 2013-01-06 DIAGNOSIS — F17200 Nicotine dependence, unspecified, uncomplicated: Secondary | ICD-10-CM | POA: Insufficient documentation

## 2013-01-06 DIAGNOSIS — Z791 Long term (current) use of non-steroidal anti-inflammatories (NSAID): Secondary | ICD-10-CM | POA: Insufficient documentation

## 2013-01-07 ENCOUNTER — Encounter (HOSPITAL_COMMUNITY): Payer: Self-pay | Admitting: Emergency Medicine

## 2013-01-07 ENCOUNTER — Emergency Department (HOSPITAL_COMMUNITY)
Admission: EM | Admit: 2013-01-07 | Discharge: 2013-01-07 | Disposition: A | Discharge status: Home / Self Care | Payer: Medicare Other | Attending: Emergency Medicine | Admitting: Emergency Medicine

## 2013-01-07 DIAGNOSIS — L723 Sebaceous cyst: Secondary | ICD-10-CM

## 2013-01-07 DIAGNOSIS — R51 Headache: Secondary | ICD-10-CM

## 2013-01-07 MED ORDER — SODIUM CHLORIDE 0.9 % IV BOLUS (SEPSIS)
1000.0000 mL | Freq: Once | INTRAVENOUS | Status: AC
  Administered 2013-01-07: 1000 mL via INTRAVENOUS

## 2013-01-07 MED ORDER — DIPHENHYDRAMINE HCL 50 MG/ML IJ SOLN
25.0000 mg | Freq: Once | INTRAMUSCULAR | Status: AC
  Administered 2013-01-07: 25 mg via INTRAVENOUS
  Filled 2013-01-07: qty 1

## 2013-01-07 MED ORDER — DEXAMETHASONE SODIUM PHOSPHATE 10 MG/ML IJ SOLN
10.0000 mg | Freq: Once | INTRAMUSCULAR | Status: AC
  Administered 2013-01-07: 10 mg via INTRAVENOUS
  Filled 2013-01-07: qty 1

## 2013-01-07 MED ORDER — OXYCODONE-ACETAMINOPHEN 5-325 MG PO TABS
2.0000 | ORAL_TABLET | Freq: Once | ORAL | Status: AC
  Administered 2013-01-07: 2 via ORAL
  Filled 2013-01-07: qty 2

## 2013-01-07 MED ORDER — PROCHLORPERAZINE EDISYLATE 5 MG/ML IJ SOLN
10.0000 mg | Freq: Once | INTRAMUSCULAR | Status: AC
  Administered 2013-01-07: 10 mg via INTRAVENOUS
  Filled 2013-01-07: qty 2

## 2013-01-07 NOTE — ED Notes (Signed)
PT. REPORTS ABSCESS AT [facility] ONSET 2 DAYS AGO WITH NO DRAINAGE.

## 2013-01-07 NOTE — ED Provider Notes (Signed)
History     CSN: [id number]  Arrival date & time 01/06/13  2348   None     Chief Complaint  Patient presents with  . Abscess    (Consider location/radiation/quality/duration/timing/severity/associated sxs/prior treatment) HPI History provided by pt and prior chart.  Pt presents today w/ multiple complaints.  Has had a constant, severe headache, gradual in onset for the past three days.  Located behind the eyes.  No relief w/ tylenol.  Associated w/ photophobia, blurred vision, nausea and lightheadedness.  Pt has h/o migraine but current pain feels different.  Denies trauma.  H/o migraine w/ aura but current pain different (location and presence of photophobia/vision changes).  FH of cerebral aneurysm.  Per prior chart, pt admitted to hospital 12/14/12 for headache and CP.  Reported that his headache was atypical at that time as well.  CT head and MRI/MRA brain unremarkable.  Returned to ED with headache on 4/13 and had nml LP.  Seen three times since then for post-LP headache.  Also c/o abscess of scalp.  Painful.  Non-draining.  No relief w/ warm compresses.  At time of last admission, had a draining scalp abscess that was I&D'd and treated w/ abx.  H/o HIV.  Most recent CD4 count 600 in 09/2012.   Past Medical History  Diagnosis Date  . HIV positive 2004  . Hypertension   . Meningitis   . Coronary artery disease   . Chest pain at rest 09/23/2012  . Chronic bronchitis     "q year last 5 yr or so" (09/24/2012)  . Exertional dyspnea   . Migraines   . Arthritis     "both shoulders" (09/24/2012)  . Chronic lower back pain   . Pneumonia   . Lumbar disc disease   . Iliac artery aneurysm     Right    Past Surgical History  Procedure Laterality Date  . Intussusception repair  10/2011  . Posterior lumbar fusion  1995  . Elbow surgery  ~ 1997    "removed some stones; right" (09/24/2012)  . Appendectomy  2013  . Back surgery  2006    4 BACK SURGERIES  . Abdominal surgery    . Spine surgery       Injury to back 1995    Family History  Problem Relation Age of Onset  . Heart disease Father   . Heart disease Brother   . Diabetes Brother     History  Substance Use Topics  . Smoking status: Current Every Day Smoker -- 0.10 packs/day for 45 years    Types: Cigarettes  . Smokeless tobacco: Never Used     Comment: 09/24/2012 "been stopped now ~ 8 month".  3 per day now.  . Alcohol Use: No      Review of Systems  All other systems reviewed and are negative.    Allergies  Flexeril; Hydrocodone; Nabumetone; [name]; Tramadol; Bee venom; and Other  Home Medications   Current Outpatient Rx  Name  Route  Sig  Dispense  Refill  . acetaminophen (TYLENOL) 325 MG tablet   Oral   Take 650 mg by mouth 2 (two) times daily as needed for pain.         Marland Kitchen albuterol (PROVENTIL HFA;VENTOLIN HFA) 108 (90 BASE) MCG/ACT inhaler   Inhalation   Inhale 2 puffs into the lungs every 6 (six) hours as needed for wheezing or shortness of breath.          Marland Kitchen aspirin EC 81 MG  tablet   Oral   Take 81 mg by mouth daily.          . baclofen (LIORESAL) 10 MG tablet   Oral   Take 1 tablet (10 mg total) by mouth 3 (three) times daily.   30 tablet   0   . butalbital-acetaminophen-caffeine (FIORICET) 50-325-40 MG per tablet   Oral   Take 1 tablet by mouth every 6 (six) hours as needed for pain or headache.   20 tablet   0   . diazepam (VALIUM) 5 MG tablet   Oral   Take 5 mg by mouth 2 (two) times daily as needed for anxiety.          . lidocaine (LIDODERM) 5 %   Transdermal   Place 1 patch onto the skin daily. Remove & Discard patch within 12 hours or as directed by MD         . meloxicam (MOBIC) 15 MG tablet   Oral   Take 15 mg by mouth daily.         . Multiple Vitamin (MULTIVITAMIN WITH MINERALS) TABS   Oral   Take 1 tablet by mouth daily.         . ramipril (ALTACE) 10 MG tablet   Oral   Take 5 mg by mouth 2 (two) times daily.           BP 156/107  Pulse  68  Temp(Src) 98.3 F (36.8 C) (Oral)  Resp 18  SpO2 98%  Physical Exam  Nursing note and vitals reviewed. Constitutional: He is oriented to person, place, and time. He appears well-developed and well-nourished. No distress.  HENT:  Head: Normocephalic and atraumatic.  No tenderness of sinuses or temples.  Multiple sebaceous cysts of scalp.  There is a tender, 0.5-1cm, non-fluctuant, non-draining and poorly visible mass L occipital scalp.    Eyes:  Normal appearance  Neck: Normal range of motion. Neck supple. No rigidity. No Brudzinski's sign and no Kernig's sign noted.  Cardiovascular: Normal rate, regular rhythm and intact distal pulses.   Pulmonary/Chest: Effort normal and breath sounds normal.  Musculoskeletal: Normal range of motion.  Neurological: He is alert and oriented to person, place, and time. No sensory deficit. Coordination normal.  CN 3-12 intact.  No nystagmus. 5/5 and equal upper and lower extremity strength.  No past pointing.     Skin: Skin is warm and dry. No rash noted.  Psychiatric: He has a normal mood and affect. His behavior is normal.    ED Course  Procedures (including critical care time)  Labs Reviewed - No data to display No results found.   1. Headache   2. Sebaceous cyst       MDM  57yo M w/ HIV, most recent CD4 count 600 in 09/2012, presents w/ c/o headache.  Several recent ED visits for same (nml CT and MRI/MRA 12/14/12, nml LP 4/13 and then treated on three different occasions for post-LP headache).  On exam, afebrile, uncomfortable appearing, no focal neuro deficits or meningeal signs.  Pain improved w/ IVF, compazine, decadron and benadryl.  Also c/o painful scalp lesion.  No obvious abscess or cellulitis on exam.  Pt referred back to PCP and ID.  He is aware that he is hypertensive today and reports compliance with newly prescribed BP medication.  Return precautions discussed.        Otilio Miu, PA-C 01/07/13 [phone]

## 2013-01-07 NOTE — ED Provider Notes (Signed)
Medical screening examination/treatment/procedure(s) were performed by non-physician practitioner and as supervising physician I was immediately available for consultation/collaboration.  Olivia Mackie, MD 01/07/13 [phone]

## 2013-01-07 NOTE — ED Provider Notes (Signed)
I saw and evaluated the patient, reviewed the resident's note and I agree with the findings and plan. Pt c/o low back pain, hx same. No recent injury. No numbness/weakness. No fever or chills. Spine nt.   Suzi Roots, MD 01/07/13 [phone]

## 2013-01-12 ENCOUNTER — Emergency Department (HOSPITAL_COMMUNITY)
Admission: EM | Admit: 2013-01-12 | Discharge: 2013-01-12 | Disposition: A | Discharge status: Home / Self Care | Payer: Medicare Other | Attending: Emergency Medicine | Admitting: Emergency Medicine

## 2013-01-12 ENCOUNTER — Encounter (HOSPITAL_COMMUNITY): Payer: Self-pay | Admitting: Emergency Medicine

## 2013-01-12 DIAGNOSIS — Z8679 Personal history of other diseases of the circulatory system: Secondary | ICD-10-CM | POA: Insufficient documentation

## 2013-01-12 DIAGNOSIS — M545 Low back pain, unspecified: Secondary | ICD-10-CM | POA: Insufficient documentation

## 2013-01-12 DIAGNOSIS — G8928 Other chronic postprocedural pain: Secondary | ICD-10-CM | POA: Insufficient documentation

## 2013-01-12 DIAGNOSIS — F17200 Nicotine dependence, unspecified, uncomplicated: Secondary | ICD-10-CM | POA: Insufficient documentation

## 2013-01-12 DIAGNOSIS — Z8739 Personal history of other diseases of the musculoskeletal system and connective tissue: Secondary | ICD-10-CM | POA: Insufficient documentation

## 2013-01-12 DIAGNOSIS — I10 Essential (primary) hypertension: Secondary | ICD-10-CM | POA: Insufficient documentation

## 2013-01-12 DIAGNOSIS — Z8701 Personal history of pneumonia (recurrent): Secondary | ICD-10-CM | POA: Insufficient documentation

## 2013-01-12 DIAGNOSIS — Z8661 Personal history of infections of the central nervous system: Secondary | ICD-10-CM | POA: Insufficient documentation

## 2013-01-12 DIAGNOSIS — Z7982 Long term (current) use of aspirin: Secondary | ICD-10-CM | POA: Insufficient documentation

## 2013-01-12 DIAGNOSIS — Z21 Asymptomatic human immunodeficiency virus [HIV] infection status: Secondary | ICD-10-CM | POA: Insufficient documentation

## 2013-01-12 DIAGNOSIS — Z8709 Personal history of other diseases of the respiratory system: Secondary | ICD-10-CM | POA: Insufficient documentation

## 2013-01-12 DIAGNOSIS — I2510 Atherosclerotic heart disease of native coronary artery without angina pectoris: Secondary | ICD-10-CM | POA: Insufficient documentation

## 2013-01-12 DIAGNOSIS — G8929 Other chronic pain: Secondary | ICD-10-CM

## 2013-01-12 DIAGNOSIS — Z79899 Other long term (current) drug therapy: Secondary | ICD-10-CM | POA: Insufficient documentation

## 2013-01-12 DIAGNOSIS — Z8669 Personal history of other diseases of the nervous system and sense organs: Secondary | ICD-10-CM | POA: Insufficient documentation

## 2013-01-12 MED ORDER — OXYCODONE-ACETAMINOPHEN 5-325 MG PO TABS
1.0000 | ORAL_TABLET | Freq: Once | ORAL | Status: AC
  Administered 2013-01-12: 1 via ORAL
  Filled 2013-01-12: qty 1

## 2013-01-12 MED ORDER — PREDNISONE 20 MG PO TABS
60.0000 mg | ORAL_TABLET | Freq: Once | ORAL | Status: AC
Start: 2013-01-12 — End: 2013-01-12
  Administered 2013-01-12: 60 mg via ORAL
  Filled 2013-01-12: qty 3

## 2013-01-12 MED ORDER — PERCOCET 5-325 MG PO TABS: 1.0000 | ORAL_TABLET | Freq: Four times a day (QID) | ORAL | Status: DC | PRN

## 2013-01-12 MED ORDER — PREDNISONE 50 MG PO TABS: 50.0000 mg | ORAL_TABLET | Freq: Every day | ORAL | Status: DC

## 2013-01-12 NOTE — ED Notes (Signed)
Pt. Stated, i started having back pain on Friday, denies any injury

## 2013-01-12 NOTE — ED Provider Notes (Signed)
Medical screening examination/treatment/procedure(s) were performed by non-physician practitioner and as supervising physician I was immediately available for consultation/collaboration.  Feleica Fulmore R. Debroah Shuttleworth, MD 01/12/13 1537 

## 2013-01-12 NOTE — ED Provider Notes (Signed)
History     CSN: [id number]  Arrival date & time 01/12/13  1256   First MD Initiated Contact with Patient 01/12/13 1327      Chief Complaint  Patient presents with  . Back Pain    (Consider location/radiation/quality/duration/timing/severity/associated sxs/prior treatment) HPI Patient presents emergency department with lower back pain.  Patient, states, that he has a chronic lower back pain.  He is due to have surgery in May.  Patient, states, that he has no numbness, weakness, chest pain pain, shortness of breath, fever, cough, dysuria, or abdominal pain.  Patient, states, that he has not taken any medicines for his back.  Patient, states, that movement and palpation makes the pain. Past Medical History  Diagnosis Date  . HIV positive 2004  . Hypertension   . Meningitis   . Coronary artery disease   . Chest pain at rest 09/23/2012  . Chronic bronchitis     "q year last 5 yr or so" (09/24/2012)  . Exertional dyspnea   . Migraines   . Arthritis     "both shoulders" (09/24/2012)  . Chronic lower back pain   . Pneumonia   . Lumbar disc disease   . Iliac artery aneurysm     Right    Past Surgical History  Procedure Laterality Date  . Intussusception repair  10/2011  . Posterior lumbar fusion  1995  . Elbow surgery  ~ 1997    "removed some stones; right" (09/24/2012)  . Appendectomy  2013  . Back surgery  2006    4 BACK SURGERIES  . Abdominal surgery    . Spine surgery      Injury to back 1995    Family History  Problem Relation Age of Onset  . Heart disease Father   . Heart disease Brother   . Diabetes Brother     History  Substance Use Topics  . Smoking status: Current Every Day Smoker -- 0.10 packs/day for 45 years    Types: Cigarettes  . Smokeless tobacco: Never Used     Comment: 09/24/2012 "been stopped now ~ 8 month".  3 per day now.  . Alcohol Use: No      Review of Systems All other systems negative except as documented in the HPI. All pertinent positives  and negatives as reviewed in the HPI. Allergies  Flexeril; Hydrocodone; Nabumetone; [name]; Tramadol; Bee venom; and Other  Home Medications   Current Outpatient Rx  Name  Route  Sig  Dispense  Refill  . albuterol (PROVENTIL HFA;VENTOLIN HFA) 108 (90 BASE) MCG/ACT inhaler   Inhalation   Inhale 2 puffs into the lungs every 6 (six) hours as needed for wheezing or shortness of breath.          Marland Kitchen aspirin EC 81 MG tablet   Oral   Take 81 mg by mouth daily.          . Multiple Vitamin (MULTIVITAMIN WITH MINERALS) TABS   Oral   Take 1 tablet by mouth daily.         . ramipril (ALTACE) 10 MG tablet   Oral   Take 5 mg by mouth 2 (two) times daily.           BP 153/104  Pulse 99  Temp(Src) 97.7 F (36.5 C)  Resp 16  SpO2 99%  Physical Exam  Nursing note and vitals reviewed. Constitutional: He is oriented to person, place, and time. He appears well-developed and well-nourished. No distress.  HENT:  Head:  Normocephalic and atraumatic.  Cardiovascular: Normal rate and regular rhythm.   Pulmonary/Chest: Effort normal.  Musculoskeletal:       Lumbar back: He exhibits tenderness, pain and spasm. He exhibits normal range of motion and no deformity.       Back:  Neurological: He is alert and oriented to person, place, and time. He has normal reflexes. He exhibits normal muscle tone. Coordination normal.  Skin: Skin is warm and dry. No rash noted.    ED Course  Procedures (including critical care time)  Patient has no neurological deficits noted on exam.  Patient will be referred back to his primary Dr. told to return here as needed   MDM          Carlyle Dolly, PA-C 01/12/13 1402

## 2013-01-13 ENCOUNTER — Encounter: Payer: Self-pay | Admitting: Vascular Surgery

## 2013-01-14 ENCOUNTER — Encounter: Payer: Self-pay | Admitting: Internal Medicine

## 2013-01-14 ENCOUNTER — Ambulatory Visit (AMBULATORY_SURGERY_CENTER): Payer: Medicare Other | Admitting: Internal Medicine

## 2013-01-14 ENCOUNTER — Encounter: Payer: Medicare Other | Admitting: Vascular Surgery

## 2013-01-14 VITALS — BP 151/100 | HR 55 | Temp 97.9°F | Resp 12 | Ht 69.0 in | Wt 161.0 lb

## 2013-01-14 DIAGNOSIS — D126 Benign neoplasm of colon, unspecified: Secondary | ICD-10-CM

## 2013-01-14 DIAGNOSIS — Z1211 Encounter for screening for malignant neoplasm of colon: Secondary | ICD-10-CM

## 2013-01-14 MED ORDER — SODIUM CHLORIDE 0.9 % IV SOLN: 500.0000 mL | INTRAVENOUS | Status: DC

## 2013-01-14 NOTE — Patient Instructions (Addendum)

## 2013-01-14 NOTE — Op Note (Signed)
[facility] [address] [place], [address]   COLONOSCOPY PROCEDURE REPORT  PATIENT: Kenneth [name], Kenneth [name]  MR#: [id number] BIRTHDATE: 12-Nov-1955 , 56  yrs. old GENDER: Male ENDOSCOPIST: Beverley Fiedler, MD REFERRED BY: Denton Ar, MD PROCEDURE DATE:  01/14/2013 PROCEDURE:   Colonoscopy with snare polypectomy ASA CLASS:   Class III INDICATIONS:average risk screening and first colonoscopy. MEDICATIONS: MAC sedation, administered by CRNA and propofol (Diprivan) 400mg  IV  DESCRIPTION OF PROCEDURE:   After the risks benefits and alternatives of the procedure were thoroughly explained, informed consent was obtained.  A digital rectal exam revealed no rectal mass.   The LB CF-[id number]  endoscope was introduced through the anus and advanced to the cecum, which was identified by both the appendix and ileocecal valve. No adverse events experienced. The quality of the prep was Moviprep fair  The instrument was then slowly withdrawn as the colon was fully examined.   COLON FINDINGS: Three sessile polyps measuring 4-6 mm in size were found in the transverse colon (1) and sigmoid colon (2). Polypectomy was performed using cold snare.  All resections were complete and all polyp tissue was completely retrieved.   The colon mucosa was otherwise normal.  Retroflexed views revealed no abnormalities. The time to cecum=4 minutes 18 seconds.  Withdrawal time=17 minutes 25 seconds.  The scope was withdrawn and the procedure completed. COMPLICATIONS: There were no complications.  ENDOSCOPIC IMPRESSION: 1.   Three sessile polyps measuring 4-6 mm in size were found in the transverse colon and sigmoid colon; Polypectomy was performed using cold snare 2.   The colon mucosa was otherwise normal  RECOMMENDATIONS: 1.  Hold aspirin, aspirin products, and anti-inflammatory medication for 1 week. 2.  Await pathology results 3.  If the polyps removed today are proven to be  adenomatous (pre-cancerous) polyps, you will need a colonoscopy in 3 years. Otherwise you should continue to follow colorectal cancer screening guidelines for "routine risk" patients with a colonoscopy in 10 years.  You will receive a letter within 1-2 weeks with the results of your biopsy as well as final recommendations.  Please call my office if you have not received a letter after 3 weeks.   eSigned:  Beverley Fiedler, MD 01/14/2013 12:24 PM   cc: The Patient

## 2013-01-14 NOTE — Progress Notes (Signed)
Dr. Rhea Belton notified of pt. BP's-168/119 left,  170/123 right.  Pt. States he has taken BP meds this am.  And that for "several months" he Has had uncontrolled BPs.

## 2013-01-14 NOTE — Progress Notes (Signed)
Patient did not experience any of the following events: a burn prior to discharge; a fall within the facility; wrong site/side/patient/procedure/implant event; or a hospital transfer or hospital admission upon discharge from the facility. (G8907) Patient did not have preoperative order for IV antibiotic SSI prophylaxis. (G8918)  

## 2013-01-14 NOTE — Progress Notes (Signed)
Called to room to assist during endoscopic procedure.  Patient ID and intended procedure confirmed with present staff. Received instructions for my participation in the procedure from the performing physician.  

## 2013-01-15 ENCOUNTER — Telehealth: Payer: Self-pay | Admitting: *Deleted

## 2013-01-15 NOTE — Telephone Encounter (Signed)
  Follow up Call-  Call back number 01/14/2013  Post procedure Call Back phone  # [phone]  Permission to leave phone message Yes     Patient questions:  Do you have a fever, pain , or abdominal swelling? no Pain Score  0 *  Have you tolerated food without any problems? yes  Have you been able to return to your normal activities? yes  Do you have any questions about your discharge instructions: Diet   no Medications  no Follow up visit  no  Do you have questions or concerns about your Care? no  Actions: * If pain score is 4 or above: No action needed, pain <4.

## 2013-01-16 ENCOUNTER — Ambulatory Visit (INDEPENDENT_AMBULATORY_CARE_PROVIDER_SITE_OTHER): Payer: Medicare Other | Admitting: Internal Medicine

## 2013-01-16 ENCOUNTER — Encounter: Payer: Self-pay | Admitting: Internal Medicine

## 2013-01-16 VITALS — BP 169/109 | HR 70 | Temp 97.5°F

## 2013-01-16 DIAGNOSIS — I10 Essential (primary) hypertension: Secondary | ICD-10-CM

## 2013-01-16 DIAGNOSIS — M545 Low back pain, unspecified: Secondary | ICD-10-CM

## 2013-01-16 DIAGNOSIS — G8929 Other chronic pain: Secondary | ICD-10-CM

## 2013-01-16 MED ORDER — MORPHINE SULFATE ER 15 MG PO TBCR: 15.0000 mg | EXTENDED_RELEASE_TABLET | Freq: Three times a day (TID) | ORAL | Status: DC

## 2013-01-16 MED ORDER — ETODOLAC 500 MG PO TABS: 500.0000 mg | ORAL_TABLET | Freq: Two times a day (BID) | ORAL | Status: DC | PRN

## 2013-01-16 MED ORDER — BENAZEPRIL-HYDROCHLOROTHIAZIDE 20-25 MG PO TABS: 1.0000 | ORAL_TABLET | Freq: Every day | ORAL | Status: DC

## 2013-01-16 NOTE — Progress Notes (Signed)
Internal Medicine Clinic Visit    HPI:  Kenneth [name] is a 57 y.o. year old male with a history of HIV, hypertension, chronic back pain, migraines, and 9 ED visits in the past 6 months, who presents for follow up.  Patient states that he would like to discuss back pain today. Patient has chronic back pain with multiple surgeries and fusions. He states that he has tried "everything." He has had no changes in his pain recently, no bowel or bladder incontinence, no fever, chills. Pain is located mostly his lower spine radiating to his hips. He does occasionally have tingling in his feet and the left side. He states his back pain is constant and prevents him from doing his daily life activities. Patient states that he goes to the emergency room often to obtain narcotic pain medications.  Regarding his blood pressure, patient states that he is compliant with his Altace. It is unclear what dose he is on he did not bring his medications to the visit today. He states he takes a half of a tablet twice daily.   Past Medical History  Diagnosis Date  . HIV positive 2004  . Hypertension   . Meningitis   . Coronary artery disease   . Chest pain at rest 09/23/2012  . Chronic bronchitis     "q year last 5 yr or so" (09/24/2012)  . Exertional dyspnea   . Migraines   . Arthritis     "both shoulders" (09/24/2012)  . Chronic lower back pain   . Pneumonia   . Lumbar disc disease   . Iliac artery aneurysm     Right    Past Surgical History  Procedure Laterality Date  . Intussusception repair  10/2011  . Posterior lumbar fusion  1995  . Elbow surgery  ~ 1997    "removed some stones; right" (09/24/2012)  . Appendectomy  2013  . Back surgery  2006    4 BACK SURGERIES  . Abdominal surgery    . Spine surgery      Injury to back 1995     ROS:  A complete review of systems was otherwise negative, except as noted in the HPI.  Allergies: Flexeril; Hydrocodone; Nabumetone; [name]; Tramadol; Bee venom; and  Other  Medications: Current Outpatient Prescriptions  Medication Sig Dispense Refill  . albuterol (PROVENTIL HFA;VENTOLIN HFA) 108 (90 BASE) MCG/ACT inhaler Inhale 2 puffs into the lungs every 6 (six) hours as needed for wheezing or shortness of breath.       Marland Kitchen aspirin EC 81 MG tablet Take 81 mg by mouth daily.       . Multiple Vitamin (MULTIVITAMIN WITH MINERALS) TABS Take 1 tablet by mouth daily.      Marland Kitchen PERCOCET 5-325 MG per tablet Take 1 tablet by mouth every 6 (six) hours as needed for pain.  15 tablet  0  . predniSONE (DELTASONE) 50 MG tablet Take 1 tablet (50 mg total) by mouth daily.  5 tablet  0  . ramipril (ALTACE) 10 MG tablet Take 5 mg by mouth 2 (two) times daily.       No current facility-administered medications for this visit.    History   Social History  . Marital Status: Married    Spouse Name: N/A    Number of Children: N/A  . Years of Education: N/A   Occupational History  . Not on file.   Social History Main Topics  . Smoking status: Former Smoker -- 0.10 packs/day for 45  years    Types: Cigarettes    Quit date: 09/24/2012  . Smokeless tobacco: Never Used     Comment: 09/24/2012 "been stopped now ~ 8 month".  3 per day now.  . Alcohol Use: No  . Drug Use: Yes    Special: Cocaine     Comment: last use 09/24/2012   . Sexually Active: Not on file     Comment: declined condoms   Other Topics Concern  . Not on file   Social History Narrative  . No narrative on file    family history includes Diabetes in his brother and Heart disease in his brother and father.  Physical Exam Blood pressure 170/121, pulse 84, temperature 97.5 F (36.4 C), temperature source Oral, SpO2 98.00%. General:  No acute distress, alert and oriented x 3, thin AAM, hunched over his chair when I entered the room HEENT:  PERRL, EOMI, no lymphadenopathy, moist mucous membranes Cardiovascular:  Regular rate and rhythm, no murmurs, rubs or gallops Respiratory:  Clear to auscultation  bilaterally, no wheezes, rales, or rhonchi Abdomen:  Soft, nondistended, nontender, +bowel sounds Extremities:  Warm and well-perfused, no clubbing, cyanosis, or edema.  MSK: lumbar spine tender to light touch, no numbness, strength 5/5 throughout, no neurologic deficits, cranial nerve exam normal Skin: Warm, dry, no rashes Neuro: normal affect  Labs: Lab Results  Component Value Date   CREATININE 0.77 12/29/2012   BUN 18 12/29/2012   NA 139 12/29/2012   K 3.8 12/29/2012   CL 106 12/29/2012   CO2 27 12/29/2012   Lab Results  Component Value Date   WBC 8.1 12/29/2012   HGB 14.7 12/29/2012   HCT 42.4 12/29/2012   MCV 81.2 12/29/2012   PLT 283 12/29/2012      Assessment and Plan:  FOLLOWUP: Kenneth [name] will follow back up in our clinic in approximately 1 month. Kenneth [name] knows to call our clinic in the meantime with any questions or new issues.   Case was discussed with Dr. Josem Kaufmann, attending physician.

## 2013-01-17 LAB — PRESCRIPTION ABUSE MONITORING 15P, URINE
Amphetamine/Meth: NEGATIVE ng/mL
Barbiturate Screen, Urine: NEGATIVE ng/mL
Benzodiazepine Screen, Urine: NEGATIVE ng/mL
Buprenorphine, Urine: NEGATIVE ng/mL
Cannabinoid Scrn, Ur: NEGATIVE ng/mL
Carisoprodol, Urine: NEGATIVE ng/mL
Cocaine Metabolites: NEGATIVE ng/mL
Creatinine, Urine: 103.56 mg/dL (ref >20.0)
Fentanyl, Ur: NEGATIVE ng/mL
Meperidine, Ur: NEGATIVE ng/mL
Methadone Screen, Urine: NEGATIVE ng/mL
Oxycodone Screen, Ur: NEGATIVE ng/mL
Propoxyphene: NEGATIVE ng/mL
Tramadol Scrn, Ur: NEGATIVE ng/mL
Zolpidem, Urine: NEGATIVE ng/mL

## 2013-01-18 NOTE — Assessment & Plan Note (Signed)
BP Readings from Last 3 Encounters:  01/16/13 169/109  01/14/13 151/100  01/12/13 153/110    Lab Results  Component Value Date   NA 139 12/29/2012   K 3.8 12/29/2012   CREATININE 0.77 12/29/2012    Assessment: Blood pressure control: moderately elevated Progress toward BP goal:  deteriorated  Plan: Medications:  Start new medication Educational resources provided: brochure;video Self management tools provided: home blood pressure logbook Other plans:   -Patient given prescription for benazepril-HCTZ 20/25 -I suspect the patient is not taking his Altace or taking the proper dose, will DC this medication  -Return to clinic in one month for followup as well as BMP -May be helpful to call the pharmacy and inquire whether patient filled prescription for Lotensin

## 2013-01-18 NOTE — Assessment & Plan Note (Addendum)
Plan formulated and discussed with Dr. Josem Kaufmann  Patient continues to have chronic lower back pain, unchanged from previous, no red flag symptoms. Patient denies current cocaine use and his last UDS was negative for cocaine. Patient has had 7 ED visits during the month of April alone, all of which were pain and complaints.  Patient will likely benefit from long-acting pain medications rather than short acting.  -Obtain UDS today -Pain contract signed, patient understands that he he gets narcotics from other sources, he is to notify us. If he does not, in agreement is terminated -MS Contin 15 mg 3 times a day -will use etodolac for breakthrough pain. Patient states he does NOT have respiratory reaction with NSAIDs, he just has stomach irritation. -Return to clinic in one month

## 2013-01-20 ENCOUNTER — Encounter: Payer: Self-pay | Admitting: Internal Medicine

## 2013-01-20 DIAGNOSIS — Z860101 Personal history of adenomatous and serrated colon polyps: Secondary | ICD-10-CM

## 2013-01-20 DIAGNOSIS — Z86010 Personal history of colonic polyps: Secondary | ICD-10-CM

## 2013-01-20 HISTORY — DX: Personal history of adenomatous and serrated colon polyps: Z86.0101

## 2013-01-20 HISTORY — DX: Personal history of colonic polyps: Z86.010

## 2013-01-21 ENCOUNTER — Encounter: Payer: Self-pay | Admitting: Internal Medicine

## 2013-01-21 ENCOUNTER — Ambulatory Visit (INDEPENDENT_AMBULATORY_CARE_PROVIDER_SITE_OTHER): Payer: Medicare Other | Admitting: Internal Medicine

## 2013-01-21 VITALS — Temp 98.2°F | Ht 69.0 in | Wt 158.2 lb

## 2013-01-21 DIAGNOSIS — Z21 Asymptomatic human immunodeficiency virus [HIV] infection status: Secondary | ICD-10-CM

## 2013-01-21 LAB — OPIATES/OPIOIDS (LC/MS-MS)
Codeine Urine: NEGATIVE ng/mL
Heroin (6-AM), UR: NEGATIVE ng/mL
Hydrocodone: 1171 ng/mL — ABNORMAL HIGH
Hydromorphone: 143 ng/mL — ABNORMAL HIGH
Morphine Urine: NEGATIVE ng/mL
Norhydrocodone, Ur: 1138 ng/mL — ABNORMAL HIGH
Noroxycodone, Ur: NEGATIVE ng/mL
Oxycodone, ur: NEGATIVE ng/mL
Oxymorphone: NEGATIVE ng/mL

## 2013-01-21 MED ORDER — ENSURE PO LIQD: 237.0000 mL | Freq: Two times a day (BID) | ORAL | Status: DC

## 2013-01-21 MED ORDER — EMTRICITAB-RILPIVIR-TENOFOV DF 200-25-300 MG PO TABS: 1.0000 | ORAL_TABLET | Freq: Every day | ORAL | Status: DC

## 2013-01-21 NOTE — Addendum Note (Signed)
Addended by: Jennet Maduro D on: 01/21/2013 10:45 AM   Modules accepted: Orders
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
## 2013-01-22 ENCOUNTER — Encounter: Payer: Self-pay | Admitting: *Deleted

## 2013-01-22 NOTE — Progress Notes (Signed)
I saw and evaluated the patient.  I personally confirmed the key portions of Dr. Kesty's history and exam and reviewed pertinent patient test results.  The assessment, diagnosis, and plan were formulated together and I agree with the documentation in the resident's note. 

## 2013-01-22 NOTE — Progress Notes (Signed)
Patient ID: Kenneth [name], male   DOB: 11/11/1955, 57 y.o.   MRN: [id number] Ensure rx faxed to [facility] 01/21/13. THP to explain to pt "new" Ensure policy.  BMI must be under 21.

## 2013-01-28 ENCOUNTER — Encounter (HOSPITAL_COMMUNITY): Payer: Self-pay | Admitting: General Practice

## 2013-01-28 ENCOUNTER — Ambulatory Visit (INDEPENDENT_AMBULATORY_CARE_PROVIDER_SITE_OTHER): Payer: Medicare Other | Admitting: Internal Medicine

## 2013-01-28 ENCOUNTER — Ambulatory Visit (HOSPITAL_COMMUNITY)
Admission: RE | Admit: 2013-01-28 | Discharge: 2013-01-28 | Disposition: A | Discharge status: Home / Self Care | Payer: Medicare Other | Source: Ambulatory Visit | Attending: Internal Medicine | Admitting: Internal Medicine

## 2013-01-28 ENCOUNTER — Encounter: Payer: Self-pay | Admitting: Internal Medicine

## 2013-01-28 ENCOUNTER — Observation Stay (HOSPITAL_COMMUNITY): Payer: Medicare Other

## 2013-01-28 ENCOUNTER — Observation Stay (HOSPITAL_COMMUNITY)
Admission: AD | Admit: 2013-01-28 | Discharge: 2013-01-29 | Disposition: A | Discharge status: Home / Self Care | Payer: Medicare Other | Source: Ambulatory Visit | Attending: Internal Medicine | Admitting: Internal Medicine

## 2013-01-28 VITALS — BP 107/76 | HR 102 | Temp 96.5°F | Ht 69.0 in | Wt 157.8 lb

## 2013-01-28 DIAGNOSIS — I10 Essential (primary) hypertension: Secondary | ICD-10-CM | POA: Insufficient documentation

## 2013-01-28 DIAGNOSIS — G8929 Other chronic pain: Secondary | ICD-10-CM | POA: Diagnosis present

## 2013-01-28 DIAGNOSIS — F1410 Cocaine abuse, uncomplicated: Secondary | ICD-10-CM | POA: Insufficient documentation

## 2013-01-28 DIAGNOSIS — J449 Chronic obstructive pulmonary disease, unspecified: Secondary | ICD-10-CM | POA: Insufficient documentation

## 2013-01-28 DIAGNOSIS — K219 Gastro-esophageal reflux disease without esophagitis: Secondary | ICD-10-CM | POA: Insufficient documentation

## 2013-01-28 DIAGNOSIS — B20 Human immunodeficiency virus [HIV] disease: Secondary | ICD-10-CM | POA: Insufficient documentation

## 2013-01-28 DIAGNOSIS — R519 Headache, unspecified: Secondary | ICD-10-CM | POA: Diagnosis present

## 2013-01-28 DIAGNOSIS — I509 Heart failure, unspecified: Secondary | ICD-10-CM | POA: Diagnosis present

## 2013-01-28 DIAGNOSIS — R51 Headache: Secondary | ICD-10-CM | POA: Insufficient documentation

## 2013-01-28 DIAGNOSIS — Z21 Asymptomatic human immunodeficiency virus [HIV] infection status: Secondary | ICD-10-CM | POA: Diagnosis present

## 2013-01-28 DIAGNOSIS — R079 Chest pain, unspecified: Secondary | ICD-10-CM

## 2013-01-28 DIAGNOSIS — M940 Chondrocostal junction syndrome [Tietze]: Principal | ICD-10-CM | POA: Insufficient documentation

## 2013-01-28 DIAGNOSIS — I2510 Atherosclerotic heart disease of native coronary artery without angina pectoris: Secondary | ICD-10-CM | POA: Insufficient documentation

## 2013-01-28 DIAGNOSIS — J4489 Other specified chronic obstructive pulmonary disease: Secondary | ICD-10-CM | POA: Insufficient documentation

## 2013-01-28 LAB — RAPID URINE DRUG SCREEN, HOSP PERFORMED
Amphetamines: NOT DETECTED
Barbiturates: NOT DETECTED
Benzodiazepines: NOT DETECTED
Cocaine: NOT DETECTED
Opiates: POSITIVE — AB
Tetrahydrocannabinol: NOT DETECTED

## 2013-01-28 LAB — COMPLETE METABOLIC PANEL WITH GFR
ALT: 10 U/L (ref 0–53)
AST: 15 U/L (ref 0–37)
Albumin: 3.9 g/dL (ref 3.5–5.2)
Alkaline Phosphatase: 174 U/L — ABNORMAL HIGH (ref 39–117)
BUN: 23 mg/dL (ref 6–23)
CO2: 29 mEq/L (ref 19–32)
Calcium: 10.5 mg/dL (ref 8.4–10.5)
Chloride: 94 mEq/L — ABNORMAL LOW (ref 96–112)
Creat: 1.11 mg/dL (ref 0.50–1.35)
GFR, Est African American: 85 mL/min
GFR, Est Non African American: 73 mL/min
Glucose, Bld: 103 mg/dL — ABNORMAL HIGH (ref 70–99)
Potassium: 4.3 mEq/L (ref 3.5–5.3)
Sodium: 133 mEq/L — ABNORMAL LOW (ref 135–145)
Total Bilirubin: 0.3 mg/dL (ref 0.3–1.2)
Total Protein: 9.8 g/dL — ABNORMAL HIGH (ref 6.0–8.3)

## 2013-01-28 LAB — TROPONIN I
Troponin I: <0.3 ng/mL (ref <0.30)
Troponin I: <0.3 ng/mL (ref <0.30)

## 2013-01-28 LAB — CBC
HCT: 51.4 % (ref 39.0–52.0)
Hemoglobin: 17.3 g/dL — ABNORMAL HIGH (ref 13.0–17.0)
MCH: 27.6 pg (ref 26.0–34.0)
MCHC: 33.7 g/dL (ref 30.0–36.0)
MCV: 82.1 fL (ref 78.0–100.0)
Platelets: 294 10*3/uL (ref 150–400)
RBC: 6.23 MIL/uL — ABNORMAL HIGH (ref 4.22–5.81)
RDW: 14.8 % (ref 11.5–15.5)
WBC: 8 10*3/uL (ref 4.0–10.5)

## 2013-01-28 MED ORDER — ETODOLAC 300 MG PO CAPS
500.0000 mg | ORAL_CAPSULE | Freq: Two times a day (BID) | ORAL | Status: DC
  Administered 2013-01-28 – 2013-01-29 (×2): 500 mg via ORAL
  Filled 2013-01-28 (×3): qty 1

## 2013-01-28 MED ORDER — ENOXAPARIN SODIUM 40 MG/0.4ML ~~LOC~~ SOLN
40.0000 mg | SUBCUTANEOUS | Status: DC
  Administered 2013-01-28: 40 mg via SUBCUTANEOUS
  Filled 2013-01-28 (×2): qty 0.4

## 2013-01-28 MED ORDER — OXYCODONE HCL 5 MG PO TABS: 5.0000 mg | ORAL_TABLET | Freq: Four times a day (QID) | ORAL | Status: DC | PRN

## 2013-01-28 MED ORDER — RILPIVIRINE HCL 25 MG PO TABS
25.0000 mg | ORAL_TABLET | Freq: Every day | ORAL | Status: DC
  Administered 2013-01-28 – 2013-01-29 (×2): 25 mg via ORAL
  Filled 2013-01-28 (×3): qty 1

## 2013-01-28 MED ORDER — HYDROCHLOROTHIAZIDE 25 MG PO TABS
25.0000 mg | ORAL_TABLET | Freq: Every day | ORAL | Status: DC
  Administered 2013-01-29: 25 mg via ORAL
  Filled 2013-01-28: qty 1

## 2013-01-28 MED ORDER — BENAZEPRIL HCL 20 MG PO TABS
20.0000 mg | ORAL_TABLET | Freq: Every day | ORAL | Status: DC
  Filled 2013-01-28: qty 1

## 2013-01-28 MED ORDER — ASPIRIN EC 81 MG PO TBEC
81.0000 mg | DELAYED_RELEASE_TABLET | Freq: Every day | ORAL | Status: DC
  Administered 2013-01-29: 81 mg via ORAL
  Filled 2013-01-28 (×2): qty 1

## 2013-01-28 MED ORDER — EMTRICITAB-RILPIVIR-TENOFOV DF 200-25-300 MG PO TABS: 1.0000 | ORAL_TABLET | Freq: Every day | ORAL | Status: DC

## 2013-01-28 MED ORDER — FAMOTIDINE 20 MG PO TABS
20.0000 mg | ORAL_TABLET | Freq: Every day | ORAL | Status: DC
  Administered 2013-01-28: 20 mg via ORAL
  Filled 2013-01-28 (×2): qty 1

## 2013-01-28 MED ORDER — ACETAMINOPHEN 325 MG PO TABS: 650.0000 mg | ORAL_TABLET | ORAL | Status: DC | PRN

## 2013-01-28 MED ORDER — METOPROLOL TARTRATE 12.5 MG HALF TABLET
12.5000 mg | ORAL_TABLET | Freq: Two times a day (BID) | ORAL | Status: DC
  Filled 2013-01-28: qty 1

## 2013-01-28 MED ORDER — ASPIRIN EC 81 MG PO TBEC: 81.0000 mg | DELAYED_RELEASE_TABLET | Freq: Every day | ORAL | Status: DC

## 2013-01-28 MED ORDER — HYDROCHLOROTHIAZIDE 25 MG PO TABS
25.0000 mg | ORAL_TABLET | Freq: Every day | ORAL | Status: DC
  Filled 2013-01-28: qty 1

## 2013-01-28 MED ORDER — ATORVASTATIN CALCIUM 80 MG PO TABS
80.0000 mg | ORAL_TABLET | Freq: Every day | ORAL | Status: DC
  Administered 2013-01-28: 80 mg via ORAL
  Filled 2013-01-28 (×2): qty 1

## 2013-01-28 MED ORDER — BENAZEPRIL HCL 20 MG PO TABS
20.0000 mg | ORAL_TABLET | Freq: Every day | ORAL | Status: DC
  Administered 2013-01-29: 20 mg via ORAL
  Filled 2013-01-28: qty 1

## 2013-01-28 MED ORDER — PANTOPRAZOLE SODIUM 40 MG PO TBEC: 40.0000 mg | DELAYED_RELEASE_TABLET | Freq: Every day | ORAL | Status: DC

## 2013-01-28 MED ORDER — EMTRICITABINE-TENOFOVIR DF 200-300 MG PO TABS
1.0000 | ORAL_TABLET | Freq: Every day | ORAL | Status: DC
  Administered 2013-01-28 – 2013-01-29 (×2): 1 via ORAL
  Filled 2013-01-28 (×3): qty 1

## 2013-01-28 MED ORDER — BENAZEPRIL-HYDROCHLOROTHIAZIDE 20-25 MG PO TABS: 1.0000 | ORAL_TABLET | Freq: Every day | ORAL | Status: DC

## 2013-01-28 MED ORDER — MORPHINE SULFATE ER 15 MG PO TBCR: 15.0000 mg | EXTENDED_RELEASE_TABLET | Freq: Three times a day (TID) | ORAL | Status: DC

## 2013-01-28 MED ORDER — ALBUTEROL SULFATE HFA 108 (90 BASE) MCG/ACT IN AERS
2.0000 | INHALATION_SPRAY | Freq: Four times a day (QID) | RESPIRATORY_TRACT | Status: DC | PRN
  Filled 2013-01-28: qty 6.7

## 2013-01-28 MED ORDER — NITROGLYCERIN 0.4 MG SL SUBL: 0.4000 mg | SUBLINGUAL_TABLET | SUBLINGUAL | Status: DC | PRN

## 2013-01-28 NOTE — Assessment & Plan Note (Signed)
Patient has an extensive history of migraines but has noticed that his headaches have been getting worse after starting the MS Contin. His back pain is improved but the headaches are bothersome. This may be medication side effects from the MS Contin or a worsening of his migraines. Neuro exam was unremarkable today, cranial nerves are intact. Previous MRA of the head performed in March 2014 was negative for intracranial aneurysm.  -Discussed with patient that we may need to stop the MS Contin to see if the headaches resolve, however, I am hesitant to change anything while he is being worked up for chest pain -I did reassure the patient that he did not have an aneurysm based on previous imaging findings -Continue to monitor

## 2013-01-28 NOTE — Assessment & Plan Note (Addendum)
Plan formulated and discussed with Dr. Josem Kaufmann, attending physician, who also examined and interviewed the patient.  Patient with chest pain and risk factors including tobacco abuse, HIV, previous diagnosis of CAD (although we do not have records), hypertension, concerning for unstable angina. EKG without any T-wave inversions or ST elevations or depressions. 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 Differential includes ACS, gastritis, esophagitis (though less likely as no pain with eating), GERD, cholecystitis  -Will ask internal medicine teaching service to admit patient for chest pain rule out -Chest x-ray, repeat EKG, serial troponins, CMP, CBC, UDS -may consider barium swallow

## 2013-01-28 NOTE — H&P (Signed)
Hospital Admission Note Date: 01/28/2013  Patient name: Kenneth [name] Medical record number: [id number] Date of birth: 12/05/1955 Age: 57 y.o. Gender: male PCP: Denton Ar, MD  Medical Service: [facility]  Attending physician: Dr. Criselda Peaches   1st Contact: Dr. Heloise Beecham   Pager:[phone] 2nd Contact: Dr. Everardo Beals   Pager: [phone] After 5 pm or weekends: 1st Contact:  Intern on call   Pager: [phone] 2nd Contact:  Resident on call  Pager: [phone]  Chief Complaint: Chest pain  History of Present Illness: Kenneth [name] is a 57 y.o. year old male with a history of HIV, hypertension, chronic back pain, migraines, and 9 ED visits in the past 6 months, recently started on MS Contin for chronic back pain, who presents with chief complaint of headache and chest pain.  At 10am this morning, he had an episode of chest pressure and discomfort while sitting in a recliner. Symptoms lasted about an hour and improved after taking a nitroglycerin tablet. Relief lasted 30 min to 1 hour. Pain described as a ball trying to push up into his chest from his abdomen. Pain improves with drinking liquid. Pain worse with activities, or laying on his back. No diaphoresis, but he does feel hot, feels like he's sweating. No SOB. No pain or swelling in legs. CP unrelated to food consumption.  No nausea or vomiting, no diarrhea.he has had no difficulty swallowing. Denies any cocaine use.  No blurry vision, but he does get lightheaded when he stands up, even before taking the nitroglycerin.     Meds:      albuterol 108 (90 BASE) MCG/ACT inhaler  Commonly known as:  PROVENTIL HFA;VENTOLIN HFA  Inhale 2 puffs into the lungs every 6 (six) hours as needed for wheezing or shortness of breath.     aspirin EC 81 MG tablet  Take 81 mg by mouth daily.     benazepril-hydrochlorthiazide 20-25 MG per tablet  Commonly known as:  LOTENSIN HCT  Take 1 tablet by mouth daily.     Emtricitab-Rilpivir-Tenofovir 200-25-300 MG Tabs  Take 1  tablet by mouth daily.     etodolac 500 MG tablet  Commonly known as:  LODINE  Take 1 tablet (500 mg total) by mouth 2 (two) times daily as needed (For breakthrough pain).     morphine 15 MG 12 hr tablet  Commonly known as:  MS CONTIN  Take 1 tablet (15 mg total) by mouth 3 (three) times daily.     multivitamin with minerals Tabs  Take 1 tablet by mouth daily.       Allergies: Allergies as of 01/28/2013 - Review Complete 01/28/2013  Allergen Reaction Noted  . Flexeril (cyclobenzaprine hcl) Anaphylaxis, Shortness Of Breath, and Rash 10/28/2011  . Hydrocodone Itching 09/13/2012  . Nabumetone Shortness Of Breath, Itching, and Other (See Comments) 09/18/2012  . [name] (naproxen) Shortness Of Breath, Nausea And Vomiting, and Rash 10/28/2011  . Tramadol Shortness Of Breath and Rash 10/28/2011  . Bee venom Itching and Other (See Comments) 09/18/2012  . Other Itching 09/18/2012   Past Medical History  Diagnosis Date  . HIV positive 2004  . Hypertension   . Meningitis   . Coronary artery disease   . Chest pain at rest 09/23/2012  . Chronic bronchitis     "q year last 5 yr or so" (09/24/2012)  . Exertional dyspnea   . Migraines   . Arthritis     "both shoulders" (09/24/2012)  . Chronic lower back pain   . Pneumonia   .  Lumbar disc disease   . Iliac artery aneurysm     Right   Past Surgical History  Procedure Laterality Date  . Intussusception repair  10/2011  . Posterior lumbar fusion  1995  . Elbow surgery  ~ 1997    "removed some stones; right" (09/24/2012)  . Appendectomy  2013  . Back surgery  2006    4 BACK SURGERIES  . Abdominal surgery    . Spine surgery      Injury to back 1995   Family History  Problem Relation Age of Onset  . Heart disease Father   . Heart disease Brother   . Diabetes Brother    History   Social History  . Marital Status: Married    Spouse Name: N/A    Number of Children: N/A  . Years of Education: N/A   Occupational History  . Not on  file.   Social History Main Topics  . Smoking status: Former Smoker -- 0.10 packs/day for 45 years    Types: Cigarettes    Quit date: 09/24/2012  . Smokeless tobacco: Never Used     Comment: 09/24/2012 "been stopped now ~ 8 month".  3 per day now.  . Alcohol Use: No  . Drug Use: Yes    Special: Cocaine     Comment: last use 09/24/2012   . Sexually Active: Not on file     Comment: given condoms   Other Topics Concern  . Not on file   Social History Narrative  . No narrative on file    Review of Systems: 10 pt ROS performed, pertinent positives and negatives noted in HPI  Physical Exam: Blood pressure 111/85, pulse 83, temperature 97.8 F (36.6 C), temperature source Oral, resp. rate 18, height 5\' 9"  (1.753 m), weight 155 lb (70.308 kg), SpO2 100.00%. Vitals reviewed. General: resting in bed, NAD HEENT: PERRL, EOMI, no scleral icterus Cardiac: RRR, no rubs, murmurs or gallops Chest: Pain reproducible with palpation of R sternocostal articulations Pulm: clear to auscultation bilaterally, no wheezes, rales, or rhonchi Abd: soft, nontender, nondistended, BS present Ext: warm and well perfused, no pedal edema Neuro: alert and oriented X3, cranial nerves II-XII grossly intact, strength and sensation to light touch equal in bilateral upper and lower extremities  CBC:  Recent Labs  01/28/13 1616  WBC 8.0  HGB 17.3*  HCT 51.4  MCV 82.1  PLT 294   Urine Drug Screen: Drugs of Abuse     Component Value Date/Time   LABOPIA PPS 01/16/2013 1511   LABOPIA NONE DETECTED 12/14/2012 1435   COCAINSCRNUR NEG 01/16/2013 1511   COCAINSCRNUR NONE DETECTED 12/14/2012 1435   LABBENZ NEG 01/16/2013 1511   LABBENZ POSITIVE* 12/14/2012 1435   AMPHETMU NONE DETECTED 12/14/2012 1435   THCU NONE DETECTED 12/14/2012 1435   LABBARB NEG 01/16/2013 1511   LABBARB NONE DETECTED 12/14/2012 1435     Other results: EKG:  Normal sinus rhythm, normal intervals, L anterior hemiblock, LAD, compared to prior  12/30/12, no significant changes.   Assessment & Plan by Problem: 1) Chest pain Has several atypical features but cannot rule out ACS given high RF (tobacco abuse, HIV, previous diagnosis of CAD, hypertension, HLD). Has h/o costochondritis, not taking prescribed NSAIDs. Pain reproducible w pressure on R sternocostal articulations.  Of concern, however, is pain at rest and history of cardiac stress test this past fall with partially reversible inferior wall motion abnormality consistent with RCA territory pathology. No new changes on EKG ( L  anterior hemiblock is old).  No cough, fever, leukocytosis to suggest PNA or other respiratory etiology of CP. Eophagitis less likely as no pain with eating.  -Chest x-ray, repeat EKG, serial troponins, CMP, UDS - Start statin (not on at home) - ASA - Continue ACE-i. Hold BB as h/o cocaine use, f/u UDS - Restart Etodolac for costochondritis, famotidine - Review notes from cardiology evaluation at [facility]  2) HIV Last CD4 600 09/23/12, VL 184. Recently started back on  Emtricitab-Rilpivir-Tenofovir. - Continue meds  3) HTN BP 111/85.  Continue benazepril/HCTZ  4) Chronic lumbar back pain Recently started on MS contin, says this gives him significant HA. Also w allergies to multiple other narcotic and non-narcotic analgesics. Per PCP, hope to avoid short acting narcotics. - Etodolac now for pain  5) Chronic bronchitis Stable. - Albuterol prn.   Dispo: Tomorrow if r/o for ACS.   The patient does have a current PCP Collier Bullock, [place], [place]), therefore will be requiring OPC follow-up after discharge.   The patient does not have transportation limitations that hinder transportation to clinic appointments.  Signed: Bronson Curb 01/28/2013, 5:31 PM

## 2013-01-28 NOTE — Progress Notes (Signed)
I saw and evaluated the patient.  I personally confirmed the key portions of Dr. Vinnie Level history and exam and reviewed pertinent patient test results.  The assessment, diagnosis, and plan were formulated together and I agree with the documentation in the resident's note.  Although his chest pain is atypical he does have numerous risk factors and deserves a rule out.  If he rules out, it would be reasonable to risk stratify.  Of note, MS Contin has been reported to cause headaches in > 10% of patients.  Thus, it is quite likely he is experiencing headaches from the MS Contin.  It is also possible that he is now experiencing analagesic headaches given his history of migraines.  None-the-less, he is at risk to abuse narcotics historically and we would not be in favor of short acting narcotics to manage his chronic pain.  We would also like to avoid potentially abused long acting narcotics such as oxycontin.

## 2013-01-28 NOTE — Progress Notes (Signed)
Internal Medicine Clinic Visit    HPI:  Kenneth [name] is a 57 y.o. year old male with a history of HIV, hypertension, chronic back pain, migraines, and 9 ED visits in the past 6 months, recently started on MS Contin for chronic back pain, who presents with chief complaint of headache and chest pain.  Patient states that he was doing well for the first 4-5 days after starting MS Contin. He then started to notice a headache on day 5, noticed it was worse about 30 minutes after taking the pill. Brother and father died of ruptured aneurysm, both very suddenly. He had negative MRAin March 2014. Pain is located on the top of his head, some associated photophobia. No photophobia, no changes in his vision.he does have a history of chronic migraines.  This morning He began to have pain in his chest, started at 10am, took a nitroglycerin tablet at 11am - calmed down the chest pain. Relief lasted 30 min to 1 hour. Pain described as a ball trying to push up into his chest from his abdomen. Better when he relaxes and drinks soda, better as long as he drinks liquid. Worse with activities, worse when he lays on his back. No diaphoresis, but he does feel hot, feels like he's sweating. No SOB. No pain or swelling in legs. CP unrelated to foodconsumption.  No nausea or vomiting, no diarrhea.he has had no difficulty swallowing. Denies any cocaine use.  No blurry vision, but he does get lightheaded when he stands up, even before taking the nitroglycerin.   He is a smoker.  Past Medical History  Diagnosis Date  . HIV positive 2004  . Hypertension   . Meningitis   . Coronary artery disease   . Chest pain at rest 09/23/2012  . Chronic bronchitis     "q year last 5 yr or so" (09/24/2012)  . Exertional dyspnea   . Migraines   . Arthritis     "both shoulders" (09/24/2012)  . Chronic lower back pain   . Pneumonia   . Lumbar disc disease   . Iliac artery aneurysm     Right    Past Surgical History  Procedure  Laterality Date  . Intussusception repair  10/2011  . Posterior lumbar fusion  1995  . Elbow surgery  ~ 1997    "removed some stones; right" (09/24/2012)  . Appendectomy  2013  . Back surgery  2006    4 BACK SURGERIES  . Abdominal surgery    . Spine surgery      Injury to back 1995     ROS:  A complete review of systems was otherwise negative, except as noted in the HPI.  Allergies: Flexeril; Hydrocodone; Nabumetone; [name]; Tramadol; Bee venom; and Other  Medications: Current Outpatient Prescriptions  Medication Sig Dispense Refill  . albuterol (PROVENTIL HFA;VENTOLIN HFA) 108 (90 BASE) MCG/ACT inhaler Inhale 2 puffs into the lungs every 6 (six) hours as needed for wheezing or shortness of breath.       Marland Kitchen aspirin EC 81 MG tablet Take 81 mg by mouth daily.       . benazepril-hydrochlorthiazide (LOTENSIN HCT) 20-25 MG per tablet Take 1 tablet by mouth daily.  30 tablet  6  . Emtricitab-Rilpivir-Tenofovir 200-25-300 MG TABS Take 1 tablet by mouth daily.  30 tablet  11  . ENSURE (ENSURE) Take 237 mLs by mouth 2 (two) times daily between meals.  237 mL  11  . etodolac (LODINE) 500 MG tablet Take 1  tablet (500 mg total) by mouth 2 (two) times daily as needed (For breakthrough pain).  30 tablet  4  . morphine (MS CONTIN) 15 MG 12 hr tablet Take 1 tablet (15 mg total) by mouth 3 (three) times daily.  90 tablet  0  . Multiple Vitamin (MULTIVITAMIN WITH MINERALS) TABS Take 1 tablet by mouth daily.       No current facility-administered medications for this visit.    History   Social History  . Marital Status: Married    Spouse Name: N/A    Number of Children: N/A  . Years of Education: N/A   Occupational History  . Not on file.   Social History Main Topics  . Smoking status: Former Smoker -- 0.10 packs/day for 45 years    Types: Cigarettes    Quit date: 09/24/2012  . Smokeless tobacco: Never Used     Comment: 09/24/2012 "been stopped now ~ 8 month".  3 per day now.  . Alcohol  Use: No  . Drug Use: Yes    Special: Cocaine     Comment: last use 09/24/2012   . Sexually Active: Not on file     Comment: given condoms   Other Topics Concern  . Not on file   Social History Narrative  . No narrative on file    family history includes Diabetes in his brother and Heart disease in his brother and father.  Physical Exam Blood pressure 107/76, pulse 102, temperature 96.5 F (35.8 C), temperature source Oral, height 5\' 9"  (1.753 m), weight 157 lb 12.8 oz (71.578 kg), SpO2 97.00%. General:  No acute distress, alert and oriented x 3, thin AAM HEENT:  PERRL, 2-61mm, EOMI, no lymphadenopathy, moist mucous membranes, TMs intact, oropharynx clear without lesions or exudate, no erythema Cardiovascular:  tachycardic, no murmurs, rubs or gallops Respiratory:  Clear to auscultation bilaterally, no wheezes, rales, or rhonchi Chest: mildly tender to palpation Abdomen:  Soft, nondistended, nontender to palpation, slightly decreased bowel sounds Extremities:  Warm and well-perfused, no clubbing, cyanosis, or edema.  Neuro: Cranial nerves tested and intact, no abnormalities. No focal numbness, strength 5/5 throughout, no neurologic deficits Skin: Warm, dry, no rashes Neuro: normal affect  Labs: Lab Results  Component Value Date   CREATININE 0.77 12/29/2012   BUN 18 12/29/2012   NA 139 12/29/2012   K 3.8 12/29/2012   CL 106 12/29/2012   CO2 27 12/29/2012   Lab Results  Component Value Date   WBC 8.1 12/29/2012   HGB 14.7 12/29/2012   HCT 42.4 12/29/2012   MCV 81.2 12/29/2012   PLT 283 12/29/2012      Assessment and Plan:  FOLLOWUP: Kenneth [name] will be admitted to [facility]MT[facility] for further work up  Case was discussed with Dr. Josem [name], attending physician.    N/S lock started in left hand 22 Gauge Angiocath.  Labs done.  Report was called to Kenneth [name] on 3000.  Pt transported via wheelchair to [id number].  Kenneth Ok, RN 01/28/2013 4:10 PM

## 2013-01-29 ENCOUNTER — Other Ambulatory Visit: Payer: Self-pay

## 2013-01-29 LAB — TROPONIN I: Troponin I: <0.3 ng/mL (ref <0.30)

## 2013-01-29 MED ORDER — NITROGLYCERIN 0.4 MG SL SUBL: 0.4000 mg | SUBLINGUAL_TABLET | SUBLINGUAL | Status: DC | PRN

## 2013-01-29 MED ORDER — FAMOTIDINE 20 MG PO TABS: 20.0000 mg | ORAL_TABLET | Freq: Every day | ORAL | Status: DC

## 2013-01-29 MED ORDER — ATORVASTATIN CALCIUM 80 MG PO TABS: 80.0000 mg | ORAL_TABLET | Freq: Every day | ORAL | Status: DC

## 2013-01-29 NOTE — Progress Notes (Signed)
Subjective: One episode of chest discomfort this am, improved with movement.  Troponins negative overnight.  Concerned about headache which started after he began taking MS contin last night. No fever, chills, photophobia, neck rigidity.  Objective: Vital signs in last 24 hours: Filed Vitals:   01/28/13 1628 01/28/13 2050 01/29/13 0523  BP: 111/85 105/73 109/77  Pulse: 83 78 80  Temp: 97.8 F (36.6 C) 98.1 F (36.7 C) 98 F (36.7 C)  TempSrc: Oral Oral Oral  Resp: 18 18 18   Height: 5\' 9"  (1.753 m)    Weight: 155 lb (70.308 kg)  157 lb 8 oz (71.442 kg)  SpO2: 100% 95% 96%   Weight change:   Intake/Output Summary (Last 24 hours) at 01/29/13 1029 Last data filed at 01/29/13 0910  Gross per 24 hour  Intake    360 ml  Output      0 ml  Net    360 ml   Vitals reviewed. General: resting in bed, NAD HEENT: EOMI, MMM of OP. No nucal rigidity.  Cardiac: RRR, no rubs, murmurs or gallops Pulm: clear to auscultation bilaterally, no wheezes, rales, or rhonchi Abd: soft, nontender, nondistended, BS present Ext: warm and well perfused, no pedal edema Neuro: alert and oriented X3, cranial nerves II-XII grossly intact, strength and sensation to light touch equal in bilateral upper and lower extremities   Lab Results: Basic Metabolic Panel:  Recent Labs Lab 01/28/13 1616  NA 133*  K 4.3  CL 94*  CO2 29  GLUCOSE 103*  BUN 23  CREATININE 1.11  CALCIUM 10.5   Liver Function Tests:  Recent Labs Lab 01/28/13 1616  AST 15  ALT 10  ALKPHOS 174*  BILITOT 0.3  PROT 9.8*  ALBUMIN 3.9   CBC:  Recent Labs Lab 01/28/13 1616  WBC 8.0  HGB 17.3*  HCT 51.4  MCV 82.1  PLT 294   Cardiac Enzymes:  Recent Labs Lab 01/28/13 1616 01/28/13 2300 01/29/13 0425  TROPONINI <0.30 <0.30 <0.30   Urine Drug Screen: Drugs of Abuse     Component Value Date/Time   LABOPIA POSITIVE* 01/28/2013 1941   LABOPIA PPS 01/16/2013 1511   COCAINSCRNUR NONE DETECTED 01/28/2013 1941   COCAINSCRNUR NEG 01/16/2013 1511   LABBENZ NONE DETECTED 01/28/2013 1941   LABBENZ NEG 01/16/2013 1511   AMPHETMU NONE DETECTED 01/28/2013 1941   THCU NONE DETECTED 01/28/2013 1941   LABBARB NONE DETECTED 01/28/2013 1941   LABBARB NEG 01/16/2013 1511    Studies/Results: Dg Chest 2 View  01/28/2013   *RADIOLOGY REPORT*  Clinical Data: Chest pain.  CHEST - 2 VIEW  Comparison: 12/29/2012  Findings: There is hyperinflation of the lungs compatible with COPD.  Heart and mediastinal contours are within normal limits.  No focal opacities or effusions.  No acute bony abnormality.  IMPRESSION: COPD.  No acute findings.   Original Report Authenticated By: Charlett Nose, M.D.   Medications: I have reviewed the patient's current medications. Scheduled Meds: . aspirin EC  81 mg Oral Daily  . atorvastatin  80 mg Oral q1800  . benazepril  20 mg Oral Daily   And  . hydrochlorothiazide  25 mg Oral Daily  . emtricitabine-tenofovir  1 tablet Oral Q breakfast   And  . rilpivirine  25 mg Oral Q breakfast  . enoxaparin (LOVENOX) injection  40 mg Subcutaneous Q24H  . etodolac  500 mg Oral BID  . famotidine  20 mg Oral QAC supper   Continuous Infusions:  PRN Meds:.acetaminophen, albuterol, nitroGLYCERIN  Assessment/Plan: 1) Costochondritis Pain reproducible w palpation, improves w movement. Negative tropo x3 overnight. Restarted etodolac. Has f/u appt for this Friday w PCP  2) HIV  Last CD4 600 09/23/12, VL 184. Stable, continue ART  3) HTN  BP 109/77 . Continue benazepril/HCTZ   4) Chronic lumbar back pain  Recently started on MS contin, says this gives him significant HA. Also w allergies to multiple other narcotic and non-narcotic analgesics. Per PCP, hope to avoid short acting narcotics. Discharge on etodolac for now, to f/u w PCP Friday.  -   5) Chronic bronchitis  Stable.  - Albuterol prn.   Dispo: today The patient does have a current PCP Collier Bullock, [place], MD), therefore will be requiring OPC follow-up  after discharge.   The patient does not have transportation limitations that hinder transportation to clinic appointments.  .Services Needed at time of discharge: Y = Yes, Blank = No PT:   OT:   RN:   Equipment:   Other:     LOS: 1 day   Bronson Curb 01/29/2013, 10:29 AM

## 2013-01-29 NOTE — Discharge Summary (Signed)
Internal Medicine Teaching [facility] Discharge Note  Name: Kenneth [name] MRN: [id number] DOB: 08-30-1956 57 y.o.  Date of Admission: 01/28/2013  4:25 PM Date of Discharge: 01/29/2013 Attending Physician: Inez Catalina, MD  Discharge Diagnosis: Principal Problem:   Costochondritis Active Problems:   HIV positive   HTN (hypertension)   Coronary artery disease   Esophageal reflux   Nondependent cocaine abuse   Chronic pain   Headache   Discharge Medications:   Medication List    STOP taking these medications       morphine 15 MG 12 hr tablet  Commonly known as:  MS CONTIN      TAKE these medications       albuterol 108 (90 BASE) MCG/ACT inhaler  Commonly known as:  PROVENTIL HFA;VENTOLIN HFA  Inhale 2 puffs into the lungs every 6 (six) hours as needed for wheezing or shortness of breath.     aspirin EC 81 MG tablet  Take 81 mg by mouth daily.     atorvastatin 80 MG tablet  Commonly known as:  LIPITOR  Take 1 tablet (80 mg total) by mouth daily at 6 PM.     benazepril-hydrochlorthiazide 20-25 MG per tablet  Commonly known as:  LOTENSIN HCT  Take 1 tablet by mouth daily.     Emtricitab-Rilpivir-Tenofovir 200-25-300 MG Tabs  Take 1 tablet by mouth daily.     etodolac 500 MG tablet  Commonly known as:  LODINE  Take 1 tablet (500 mg total) by mouth 2 (two) times daily as needed (For breakthrough pain).     famotidine 20 MG tablet  Commonly known as:  PEPCID  Take 1 tablet (20 mg total) by mouth daily before supper.     multivitamin with minerals Tabs  Take 1 tablet by mouth daily.     nitroGLYCERIN 0.4 MG SL tablet  Commonly known as:  NITROSTAT  Place 1 tablet (0.4 mg total) under the tongue every 5 (five) minutes x 3 doses as needed for chest pain.        Disposition and follow-up:   Kenneth [name] was discharged from [facility] in Good condition.  At the hospital follow up visit please address  1) Chronic LBP pain  control Recently started on MS contin in [facility] which he could not tolerate 2/2 HA. Concern for abuse of short acting opiates. Please address chronic pain control. He was discharged here on etodolac for costochondritis pain. 2) Costochondritis Features of CP typical of costochondritis. Treating w etodolac, which he had been prescribed in the past but not taking. Will need follow up of his creatinine as outpatient. 3) CAD Likely history of CAD based on myoview stress in 2013 at [facility], concern for RCA territory dz. Not on statin, BB. Unclear why not on BB, may be cocaine history. Please assess need to start BB therapy as outpatient. Statin therapy was started and continued on discharge. Will need follow up of LFTs.   Follow-up Appointments: Follow-up Information   Follow up with Denton Ar, MD On 01/31/2013. (1:15pm)    Contact information:   [address] [place] [address] [phone]       Future Appointments Provider Department Dept Phone   01/31/2013 1:15 PM Larey Seat, MD [facility] [phone]   02/20/2013 1:15 PM Larey Seat, MD [facility] [phone]   03/11/2013 9:00 AM [facility] for Infectious Disease [phone]  03/25/2013 9:00 AM Cliffton Asters, MD [facility] for Infectious Disease [phone]      Consultations:  None  Procedures Performed:  Dg Chest 2 View  01/28/2013   *RADIOLOGY REPORT*  Clinical Data: Chest pain.  CHEST - 2 VIEW  Comparison: 12/29/2012  Findings: There is hyperinflation of the lungs compatible with COPD.  Heart and mediastinal contours are within normal limits.  No focal opacities or effusions.  No acute bony abnormality.  IMPRESSION: COPD.  No acute findings.   Original Report Authenticated By: Charlett Nose, M.D.   Ir Fluoro Guide Ndl Plmt / Bx  01/01/2013   *RADIOLOGY REPORT*  IR FLUORO GUIDE NEEDLE PLACEMENT /BIOPSY  Clinical Data:  Post lumbar puncture headache.  Procedure: L2-L3 Interlaminar Epidural blood patch  Fully informed written consent was obtained. Please note the patient has transitional anatomy.  Blood patch was performed at the same level of lumbar puncture based on fluoroscopic images.  The patient was placed prone on the fluoroscopic table, the L2-L3 interspace was visualized, and the skin was marked.  After thorough povidone-iodine scrubbing, the site was draped in sterile fashion. 1% lidocaine was used to anesthetize the skin and deeper soft tissues.  A 20-gauge Crawford epidural needle was inserted and advanced into the epidural space using "loss of resistance" technique with normal saline.  The epidural space was identified on first pass without evidence of blood, cerebrospinal fluid, or paresthesias.  Needle tip placement within the epidural space was confirmed using 1 mL of non-ionic contrast.  There was no intravascular or subarachnoid opacification.  Next, 20 milliliters of autologous blood, which had been drawn from the patient 15 minutes earlier and allowed to partially clot, was administered and flushed using 2 mL of normal saline.  The needle was restyletted and withdrawn.  The patient tolerated the procedure well and there was no evidence of procedural complication.  The patient was able to ambulate to the recovery area and observed for an appropriate length of time as outlined on the nursing notes. The patient was discharged home with a driver in good condition with detailed patient instructions.  Fluoroscopy time:  Six seconds  IMPRESSION:  Satisfactory interlaminar lumbar epidural blood patch, L2-L3   Original Report Authenticated By: Davonna Belling, M.D.    Admission HPI:  Kenneth [name] is a 57 y.o. year old male with a history of HIV, hypertension, chronic back pain, migraines, and 9 ED visits in the past 6 months, recently started on MS Contin for chronic back pain, who presents with chief complaint of  headache and chest pain.  At 10am this morning, he had an episode of chest pressure and discomfort while sitting in a recliner. Symptoms lasted about an hour and improved after taking a nitroglycerin tablet. Relief lasted 30 min to 1 hour. Pain described as a ball trying to push up into his chest from his abdomen. Pain improves with drinking liquid. Pain worse with activities, or laying on his back. No diaphoresis, but he does feel hot, feels like he's sweating. No SOB. No pain or swelling in legs. CP unrelated to food consumption.  No nausea or vomiting, no diarrhea.he has had no difficulty swallowing. Denies any cocaine use.  No blurry vision, but he does get lightheaded when he stands up, even before taking the nitroglycerin   Hospital Course by problem list: 1) Costochondritis  Pain reproducible w palpation of R sternocostal joints,  improves w movement. Has h/o costochondritis, not taking prescribed NSAIDs. Was admitted for  rule out of ACS given high RF (tobacco abuse, HIV, previous diagnosis of CAD, hypertension, HLD).  Cardiac stress test this past fall with partially reversible inferior wall motion abnormality consistent with RCA territory pathology.  This admission, no new changes on EKG ( L anterior hemiblock is old). Troponins negative x3. No cough, fever, leukocytosis to suggest PNA or other respiratory etiology of CP, CXR no acute findings. Eophagitis less likely as no pain with eating.  Restarted Etodolac for costochondritis, also prescribed famotidine. Pt not on beta blocker at home (? In setting of cocaine use) or statin. Statin therapy initiated w atorvastatin. Will defer initiation of BB to PCP. Has f/u appt 01/31/13.   2) HIV  Last CD4 600 09/23/12, VL 184. Recently started back on Emtricitab-Rilpivir-Tenofovir. Therapy continued while inpatient.   3) HTN  Normotensive on home regimen benazepril/HCTZ.   4) Chronic lumbar back pain  Recently started on MS contin, says this gives  him significant HA. Also w allergies to multiple other narcotic and non-narcotic analgesics. Per PCP, hope to avoid short acting narcotics. Etodolac for costochondritis pain, will defer chronic pain mgmt to PCP given multiple concerns for narcotic abuse.  5) Chronic bronchitis  Stable without exacerbation.   Discharge Vitals:  BP 109/77  Pulse 80  Temp(Src) 98 F (36.7 C) (Oral)  Resp 18  Ht 5\' 9"  (1.753 m)  Wt 157 lb 8 oz (71.442 kg)  BMI 23.25 kg/m2  SpO2 96%  Discharge Labs:  Results for orders placed during the hospital encounter of 01/28/13 (from the past 24 hour(s))  URINE RAPID DRUG SCREEN (HOSP PERFORMED)     Status: Abnormal   Collection Time    01/28/13  7:41 PM      Result Value Range   Opiates POSITIVE (*) NONE DETECTED   Cocaine NONE DETECTED  NONE DETECTED   Benzodiazepines NONE DETECTED  NONE DETECTED   Amphetamines NONE DETECTED  NONE DETECTED   Tetrahydrocannabinol NONE DETECTED  NONE DETECTED   Barbiturates NONE DETECTED  NONE DETECTED  TROPONIN I     Status: None   Collection Time    01/28/13 11:00 PM      Result Value Range   Troponin I <0.30  <0.30 ng/mL  TROPONIN I     Status: None   Collection Time    01/29/13  4:25 AM      Result Value Range   Troponin I <0.30  <0.30 ng/mL    Signed: Bronson Curb 01/29/2013, 11:04 AM   Time Spent on Discharge: 35 min Services Ordered on Discharge: none Equipment Ordered on Discharge: none

## 2013-01-29 NOTE — H&P (Signed)
Internal Medicine Teaching Service Attending Note Date: 01/29/2013  Patient name: Kenneth [name]  Medical record number: [id number]  Date of birth: 1956-03-22   CC: chest pain  I have seen and evaluated Kenneth [name] and discussed their care with the Residency Team.    Kenneth [name] is a 56yo man with PMH of HIV, HTN, chronic back pain and migraines who presented to the ED with chest pain and headache.  At 10 am on day of admission, he was sitting in a recliner and had an episode of chest pressure which felt like a something was pushing up into his chest from his abdomen.  The symptoms lasted an hour and were relieved with Ntg.  He noted the pain was bad, about a 6/10.  The pain came back about 1 hour later.  It also improved with drinking liquid.  It got worse with activity and lying on his back.  He denied SOB, diaphoresis, nausea, LE edema, pain.  He did feel hot.  The pain is sometimes improved with moving around, but not always.  He does have reproducible chest pain that he reports feels like his acute episode of chest pain.  He has recently been in the ED 9 times in the past six months for headaches and chronic low back pain.  He was last admitted in March for chest pain rule out which was thought to be related to GERD.  He was prescribed sucralfate at that time.  He further has a diagnosis of costochondritis.   For further medical history, meds, allergies, ROS, please see resident note.   Physical Exam: Blood pressure 109/77, pulse 80, temperature 98 F (36.7 C), temperature source Oral, resp. rate 18, height 5\' 9"  (1.753 m), weight 157 lb 8 oz (71.442 kg), SpO2 96.00%. General appearance: alert, cooperative, appears stated age and no distress Head: Normocephalic, without obvious abnormality, atraumatic Eyes: EOMI, anicteric sclerae Lungs: clear to auscultation bilaterally and no wheezes Chest wall: right sided chest wall tenderness Heart: RR, NR, no murmur Abdomen: soft, NT Extremities: no  edema Pulses: 2+ and symmetric  Lab results: Results for orders placed during the hospital encounter of 01/28/13 (from the past 24 hour(s))  URINE RAPID DRUG SCREEN (HOSP PERFORMED)     Status: Abnormal   Collection Time    01/28/13  7:41 PM      Result Value Range   Opiates POSITIVE (*) NONE DETECTED   Cocaine NONE DETECTED  NONE DETECTED   Benzodiazepines NONE DETECTED  NONE DETECTED   Amphetamines NONE DETECTED  NONE DETECTED   Tetrahydrocannabinol NONE DETECTED  NONE DETECTED   Barbiturates NONE DETECTED  NONE DETECTED  TROPONIN I     Status: None   Collection Time    01/28/13 11:00 PM      Result Value Range   Troponin I <0.30  <0.30 ng/mL  TROPONIN I     Status: None   Collection Time    01/29/13  4:25 AM      Result Value Range   Troponin I <0.30  <0.30 ng/mL    Imaging results:  Dg Chest 2 View  01/28/2013   *RADIOLOGY REPORT*  Clinical Data: Chest pain.  CHEST - 2 VIEW  Comparison: 12/29/2012  Findings: There is hyperinflation of the lungs compatible with COPD.  Heart and mediastinal contours are within normal limits.  No focal opacities or effusions.  No acute bony abnormality.  IMPRESSION: COPD.  No acute findings.   Original Report Authenticated By: Charlett Nose, M.D.  Assessment and Plan: I agree with the formulated Assessment and Plan with the following changes:   1. Atypical Chest pain - Atypical features include onset at rest, starting in abdomen, change with movement.  However, he does have risk factors including dx of CAD, HLD, HTN and tobacco use.   - He did have a cardiac stress test in the fall at [facility] which showed a small partially reversible inferior wall motion abnormality in the RCA territory.  No intervention was done at that facility for this small lesion.  - Will re-initiate therapy for costochondritis, serial troponin, CXR, EKG - Start statin - ASA, ace.  No BB due to h/o cocaine use.  - Anticipate that he will not need an inpatient stress test  given atypical features; consider outpatient cardiology referral if deemed necessary.  Close PCP follow up planned  Other issues per resident note.   Inez Catalina, MD 5/14/201410:40 AM

## 2013-01-31 ENCOUNTER — Ambulatory Visit (INDEPENDENT_AMBULATORY_CARE_PROVIDER_SITE_OTHER): Payer: Medicare Other | Admitting: Internal Medicine

## 2013-01-31 ENCOUNTER — Other Ambulatory Visit: Payer: Medicare Other

## 2013-01-31 ENCOUNTER — Encounter: Payer: Medicare Other | Admitting: Vascular Surgery

## 2013-01-31 ENCOUNTER — Encounter: Payer: Self-pay | Admitting: Internal Medicine

## 2013-01-31 VITALS — BP 99/67 | HR 94 | Temp 96.4°F | Ht 69.0 in | Wt 158.3 lb

## 2013-01-31 DIAGNOSIS — M545 Low back pain, unspecified: Secondary | ICD-10-CM

## 2013-01-31 DIAGNOSIS — I509 Heart failure, unspecified: Secondary | ICD-10-CM

## 2013-01-31 DIAGNOSIS — G8929 Other chronic pain: Secondary | ICD-10-CM

## 2013-01-31 DIAGNOSIS — R112 Nausea with vomiting, unspecified: Secondary | ICD-10-CM

## 2013-01-31 MED ORDER — PROMETHAZINE HCL 25 MG PO TABS: 25.0000 mg | ORAL_TABLET | Freq: Four times a day (QID) | ORAL | Status: DC | PRN

## 2013-01-31 NOTE — Discharge Summary (Signed)
I saw Kenneth [name] on day of discharge and agree with formulated plan.

## 2013-01-31 NOTE — Assessment & Plan Note (Signed)
Patient is no longer taking MS Contin as he has had side effects of headache with this medication. We do not feel comfortable prescribing a short acting narcotic medication to this patient. Therefore, we will make referral to pain clinic, per patient request

## 2013-01-31 NOTE — Progress Notes (Signed)
Internal Medicine Clinic Visit    HPI:  Kenneth [name] is a 57 y.o. year old male with a history of HIV, hypertension, chronic back pain, migraines, and 9 ED visits in the past 6 months, recently started on MS Contin for chronic back pain, who presents for hospital followup.  Patient was admitted from clinic on 01/28/2013 for chest pain rule out. Troponins were cycled and were negative. His chest pain was thought to be from costochondritis and discharged with etodolac. During hospitalization, he was also started on statin therapy and famotidine as well.  Patient is doing better, he states that his chest pain is improved, though it still comes and goes.  Patient has a new problem of nausea and vomiting. He thinks it may be due to one of his new medicines. He vomited about 10 times yesterday and again 3 times this morning. He denies any diarrhea, fever, chills, shortness of breath, no abdominal pain.  Patient also notes that he has not had any headaches since being off MS Contin.    Past Medical History  Diagnosis Date  . HIV positive 2004  . Hypertension   . Meningitis   . Coronary artery disease   . Chest pain at rest 09/23/2012  . Chronic bronchitis     "q year last 5 yr or so" (09/24/2012)  . Exertional dyspnea   . Migraines   . Arthritis     "both shoulders" (09/24/2012)  . Chronic lower back pain   . Pneumonia   . Lumbar disc disease   . Iliac artery aneurysm     Right    Past Surgical History  Procedure Laterality Date  . Intussusception repair  10/2011  . Posterior lumbar fusion  1995  . Elbow surgery  ~ 1997    "removed some stones; right" (09/24/2012)  . Appendectomy  2013  . Back surgery  2006    4 BACK SURGERIES  . Abdominal surgery    . Spine surgery      Injury to back 1995     ROS:  A complete review of systems was otherwise negative, except as noted in the HPI.  Allergies: Flexeril; Hydrocodone; Nabumetone; [name]; Tramadol; Bee venom; and  Other  Medications: Current Outpatient Prescriptions  Medication Sig Dispense Refill  . albuterol (PROVENTIL HFA;VENTOLIN HFA) 108 (90 BASE) MCG/ACT inhaler Inhale 2 puffs into the lungs every 6 (six) hours as needed for wheezing or shortness of breath.       Marland Kitchen aspirin EC 81 MG tablet Take 81 mg by mouth daily.       Marland Kitchen atorvastatin (LIPITOR) 80 MG tablet Take 1 tablet (80 mg total) by mouth daily at 6 PM.  30 tablet  5  . benazepril-hydrochlorthiazide (LOTENSIN HCT) 20-25 MG per tablet Take 1 tablet by mouth daily.  30 tablet  6  . Emtricitab-Rilpivir-Tenofovir 200-25-300 MG TABS Take 1 tablet by mouth daily.  30 tablet  11  . etodolac (LODINE) 500 MG tablet Take 1 tablet (500 mg total) by mouth 2 (two) times daily as needed (For breakthrough pain).  30 tablet  4  . famotidine (PEPCID) 20 MG tablet Take 1 tablet (20 mg total) by mouth daily before supper.  30 tablet  1  . Multiple Vitamin (MULTIVITAMIN WITH MINERALS) TABS Take 1 tablet by mouth daily.      . nitroGLYCERIN (NITROSTAT) 0.4 MG SL tablet Place 1 tablet (0.4 mg total) under the tongue every 5 (five) minutes x 3 doses as needed for  chest pain.  30 tablet  2   No current facility-administered medications for this visit.    History   Social History  . Marital Status: Married    Spouse Name: N/A    Number of Children: N/A  . Years of Education: N/A   Occupational History  . Not on file.   Social History Main Topics  . Smoking status: Former Smoker -- 0.10 packs/day for 45 years    Types: Cigarettes    Quit date: 09/24/2012  . Smokeless tobacco: Never Used     Comment: 09/24/2012 "been stopped now ~ 8 month".  3 per day now.  . Alcohol Use: No  . Drug Use: Yes    Special: Cocaine     Comment: last use 09/24/2012   . Sexually Active: Not on file     Comment: given condoms   Other Topics Concern  . Not on file   Social History Narrative  . No narrative on file    family history includes Diabetes in his brother and  Heart disease in his brother and father.  Physical Exam Blood pressure 99/67, pulse 94, temperature 96.4 F (35.8 C), temperature source Oral, height 5\' 9"  (1.753 m), weight 158 lb 4.8 oz (71.804 kg), SpO2 97.00%. General:  No acute distress, alert and oriented x 3, thin AAM HEENT:  PERRL, EOMI, no lymphadenopathy, moist mucous membranes Cardiovascular:  tachycardic, no murmurs, rubs or gallops Respiratory:  Clear to auscultation bilaterally, no wheezes, rales, or rhonchi Chest: mildly tender to palpation Abdomen:  Soft, nondistended, nontender to palpation, + bowel sounds Extremities:  Warm and well-perfused, no clubbing, cyanosis, or edema.  Neuro: No focal numbness, strength 5/5 throughout, no neurologic deficits Skin: Warm, dry, no rashes Neuro: normal affect  Labs: Lab Results  Component Value Date   CREATININE 1.11 01/28/2013   BUN 23 01/28/2013   NA 133* 01/28/2013   K 4.3 01/28/2013   CL 94* 01/28/2013   CO2 29 01/28/2013   Lab Results  Component Value Date   WBC 8.0 01/28/2013   HGB 17.3* 01/28/2013   HCT 51.4 01/28/2013   MCV 82.1 01/28/2013   PLT 294 01/28/2013      Assessment and Plan:  Plan was discussed with Dr. Kem Kays

## 2013-01-31 NOTE — Assessment & Plan Note (Signed)
Likely from medication side effects. Unclear which one.  -Stop taking atorvastatin, stop famotidine, stop etodolac -Short course of Phenergan given including sample of 25 mg promethazine given in clinic -Patient given juice and crackers and monitored after eating

## 2013-01-31 NOTE — Patient Instructions (Addendum)
Please stop taking atorvastatin, famotidine, etodolac.  Pickup Phenergan prescription from pharmacy  Return to clinic if you're vomiting and nausea do not resolve.

## 2013-02-03 NOTE — Assessment & Plan Note (Signed)
Patient started on high-dose statin during his hospitalization. However, he did not seem to tolerate this. Would consider restarting at lower dose. At next visit, will need to discuss starting beta blocker therapy after discussion with patient. He does have a history of cocaine use, though no recent positive urine drug screens.

## 2013-02-05 NOTE — Progress Notes (Signed)
Case discussed with Dr. Kesty immediately after the resident saw the patient. We reviewed the resident's history and exam and pertinent patient test results. I agree with the assessment, diagnosis and plan of care documented in the resident's note. 

## 2013-02-07 ENCOUNTER — Encounter (HOSPITAL_COMMUNITY): Payer: Self-pay | Admitting: Emergency Medicine

## 2013-02-07 ENCOUNTER — Emergency Department (HOSPITAL_COMMUNITY)
Admission: EM | Admit: 2013-02-07 | Discharge: 2013-02-07 | Disposition: A | Discharge status: Home / Self Care | Payer: Medicare Other | Attending: Emergency Medicine | Admitting: Emergency Medicine

## 2013-02-07 DIAGNOSIS — G43909 Migraine, unspecified, not intractable, without status migrainosus: Secondary | ICD-10-CM | POA: Insufficient documentation

## 2013-02-07 DIAGNOSIS — Z9889 Other specified postprocedural states: Secondary | ICD-10-CM | POA: Insufficient documentation

## 2013-02-07 DIAGNOSIS — Z7982 Long term (current) use of aspirin: Secondary | ICD-10-CM | POA: Insufficient documentation

## 2013-02-07 DIAGNOSIS — I2510 Atherosclerotic heart disease of native coronary artery without angina pectoris: Secondary | ICD-10-CM | POA: Insufficient documentation

## 2013-02-07 DIAGNOSIS — G8929 Other chronic pain: Secondary | ICD-10-CM

## 2013-02-07 DIAGNOSIS — B20 Human immunodeficiency virus [HIV] disease: Secondary | ICD-10-CM

## 2013-02-07 DIAGNOSIS — Z8701 Personal history of pneumonia (recurrent): Secondary | ICD-10-CM | POA: Insufficient documentation

## 2013-02-07 DIAGNOSIS — M19019 Primary osteoarthritis, unspecified shoulder: Secondary | ICD-10-CM | POA: Insufficient documentation

## 2013-02-07 DIAGNOSIS — Z87891 Personal history of nicotine dependence: Secondary | ICD-10-CM | POA: Insufficient documentation

## 2013-02-07 DIAGNOSIS — M549 Dorsalgia, unspecified: Secondary | ICD-10-CM

## 2013-02-07 DIAGNOSIS — M545 Low back pain, unspecified: Secondary | ICD-10-CM | POA: Insufficient documentation

## 2013-02-07 DIAGNOSIS — Z8679 Personal history of other diseases of the circulatory system: Secondary | ICD-10-CM | POA: Insufficient documentation

## 2013-02-07 DIAGNOSIS — Z8739 Personal history of other diseases of the musculoskeletal system and connective tissue: Secondary | ICD-10-CM | POA: Insufficient documentation

## 2013-02-07 DIAGNOSIS — Z21 Asymptomatic human immunodeficiency virus [HIV] infection status: Secondary | ICD-10-CM | POA: Insufficient documentation

## 2013-02-07 DIAGNOSIS — Z79899 Other long term (current) drug therapy: Secondary | ICD-10-CM | POA: Insufficient documentation

## 2013-02-07 DIAGNOSIS — I10 Essential (primary) hypertension: Secondary | ICD-10-CM | POA: Insufficient documentation

## 2013-02-07 DIAGNOSIS — Z8669 Personal history of other diseases of the nervous system and sense organs: Secondary | ICD-10-CM | POA: Insufficient documentation

## 2013-02-07 DIAGNOSIS — Z8709 Personal history of other diseases of the respiratory system: Secondary | ICD-10-CM | POA: Insufficient documentation

## 2013-02-07 MED ORDER — OXYCODONE-ACETAMINOPHEN 5-325 MG PO TABS
1.0000 | ORAL_TABLET | Freq: Once | ORAL | Status: AC
  Administered 2013-02-07: 1 via ORAL
  Filled 2013-02-07: qty 1

## 2013-02-07 MED ORDER — PREDNISONE 20 MG PO TABS: ORAL_TABLET | ORAL | Status: DC

## 2013-02-07 MED ORDER — OXYCODONE-ACETAMINOPHEN 5-325 MG PO TABS: 1.0000 | ORAL_TABLET | ORAL | Status: DC | PRN

## 2013-02-07 MED ORDER — PREDNISONE 20 MG PO TABS
50.0000 mg | ORAL_TABLET | Freq: Every day | ORAL | Status: DC
  Filled 2013-02-07: qty 3

## 2013-02-07 NOTE — ED Notes (Signed)
Pt c/o low back pain that radiates down B/L legs. Symptoms onset last night. Pt denies recent injury. Pt denies urine or bowel issues.

## 2013-02-07 NOTE — ED Provider Notes (Signed)
History     CSN: [id number]  Arrival date & time 02/07/13  7829   First MD Initiated Contact with Patient 02/07/13 1028      Chief Complaint  Patient presents with  . Back Pain    (Consider location/radiation/quality/duration/timing/severity/associated sxs/prior treatment) HPI Comments: Kenneth [name] is a 57 y/o M with PMHx of HIV positive infection, HTN, CAD, chronic bronchitis, chronic lower back pain presenting to the ED with lower back pain. Patient reported that the pain got worse last night at around 12:00AM - stated that he was getting out of bed to go to the bathroom and twisted his back in a weird way which led to extreme discomfort to his back. Described discomfort as being a sharp shooting pain that radiates to the left leg - stated that he has mild tingling to the left foot. Stated that he spoke with PCP. Dr. Collier Bullock, on Monday - PCP is trying to find a pain management specialist for patient. Denied fever, chills, injury, loss of sensation, weakness, chest pain, shortness of breathe, difficulty breathing, gi symptoms, saddle anesthesia, urinary incontinence, bowel incontinence.   Patient is a 57 y.o. male presenting with back pain. The history is provided by the patient. No language interpreter was used.  Back Pain Associated symptoms: no abdominal pain, no dysuria, no fever, no headaches and no weakness     Past Medical History  Diagnosis Date  . HIV positive 2004  . Hypertension   . Meningitis   . Coronary artery disease   . Chest pain at rest 09/23/2012  . Chronic bronchitis     "q year last 5 yr or so" (09/24/2012)  . Exertional dyspnea   . Migraines   . Arthritis     "both shoulders" (09/24/2012)  . Chronic lower back pain   . Pneumonia   . Lumbar disc disease   . Iliac artery aneurysm     Right    Past Surgical History  Procedure Laterality Date  . Intussusception repair  10/2011  . Posterior lumbar fusion  1995  . Elbow surgery  ~ 1997    "removed some stones;  right" (09/24/2012)  . Appendectomy  2013  . Back surgery  2006    4 BACK SURGERIES  . Abdominal surgery    . Spine surgery      Injury to back 1995    Family History  Problem Relation Age of Onset  . Heart disease Father   . Heart disease Brother   . Diabetes Brother     History  Substance Use Topics  . Smoking status: Former Smoker -- 0.10 packs/day for 45 years    Types: Cigarettes    Quit date: 09/24/2012  . Smokeless tobacco: Never Used     Comment: 09/24/2012 "been stopped now ~ 8 month".  3 per day now.  . Alcohol Use: No      Review of Systems  Constitutional: Negative for fever, chills and fatigue.  HENT: Negative for ear pain, sore throat, trouble swallowing, neck pain and neck stiffness.   Eyes: Negative for photophobia, pain and visual disturbance.  Respiratory: Negative for cough, chest tightness and shortness of breath.   Gastrointestinal: Negative for nausea, vomiting, abdominal pain, diarrhea, constipation and blood in stool.  Genitourinary: Negative for dysuria, decreased urine volume and difficulty urinating.  Musculoskeletal: Positive for back pain. Negative for arthralgias.  Skin: Negative for rash and wound.  Neurological: Negative for dizziness, weakness, light-headedness and headaches.    Allergies  Flexeril;  Hydrocodone; Nabumetone; [name]; Tramadol; Ace inhibitors; Bee venom; and Other  Home Medications   Current Outpatient Rx  Name  Route  Sig  Dispense  Refill  . albuterol (PROVENTIL HFA;VENTOLIN HFA) 108 (90 BASE) MCG/ACT inhaler   Inhalation   Inhale 2 puffs into the lungs every 6 (six) hours as needed for wheezing or shortness of breath.          Marland Kitchen aspirin EC 81 MG tablet   Oral   Take 81 mg by mouth daily.          . benazepril-hydrochlorthiazide (LOTENSIN HCT) 20-25 MG per tablet   Oral   Take 1 tablet by mouth daily.   30 tablet   6   . Emtricitab-Rilpivir-Tenofovir 200-25-300 MG TABS   Oral   Take 1 tablet by mouth  daily.   30 tablet   11   . Multiple Vitamin (MULTIVITAMIN WITH MINERALS) TABS   Oral   Take 1 tablet by mouth daily.         . nitroGLYCERIN (NITROSTAT) 0.4 MG SL tablet   Sublingual   Place 1 tablet (0.4 mg total) under the tongue every 5 (five) minutes x 3 doses as needed for chest pain.   30 tablet   2   . promethazine (PHENERGAN) 25 MG tablet   Oral   Take 1 tablet (25 mg total) by mouth every 6 (six) hours as needed for nausea.   14 tablet   0   . oxyCODONE-acetaminophen (PERCOCET) 5-325 MG per tablet   Oral   Take 1 tablet by mouth every 4 (four) hours as needed for pain.   6 tablet   0   . predniSONE (DELTASONE) 20 MG tablet      Take 3 tablets PO daily.   15 tablet   0     BP 102/74  Pulse 93  Temp(Src) 97.7 F (36.5 C) (Oral)  Resp 18  SpO2 99%  Physical Exam  Nursing note and vitals reviewed. Constitutional: He is oriented to person, place, and time. He appears well-developed and well-nourished. No distress.  HENT:  Head: Normocephalic and atraumatic.  Mouth/Throat: Oropharynx is clear and moist. No oropharyngeal exudate.  Uvula midline, symmetrical elevation  Eyes: Conjunctivae and EOM are normal. Pupils are equal, round, and reactive to light. Right eye exhibits no discharge. Left eye exhibits no discharge.  Neck: Normal range of motion. Neck supple. No tracheal deviation present. No thyromegaly present.  Negative neck stiffness Negative nuchal rigidity Negative lymphadenopathy  Cardiovascular: Normal rate, regular rhythm, normal heart sounds and intact distal pulses.  Exam reveals no friction rub.   No murmur heard. Pulses:      Radial pulses are 2+ on the right side, and 2+ on the left side.       Dorsalis pedis pulses are 2+ on the right side, and 2+ on the left side.  Pulmonary/Chest: Effort normal and breath sounds normal. No respiratory distress. He has no wheezes. He has no rales.  Musculoskeletal: He exhibits tenderness. He exhibits no  edema.       Lumbar back: He exhibits decreased range of motion and tenderness. He exhibits no swelling, no edema, no deformity and no laceration.       Back:  Full ROM to upper and lower extremities bilaterally Strength 5+/5+ to upper and lower extremities bilaterally  Decreased ROM to lower back with extension secondary to pain  Lymphadenopathy:    He has no cervical adenopathy.  Neurological: He is  alert and oriented to person, place, and time. No cranial nerve deficit. He exhibits normal muscle tone. Coordination normal.  Cranial nerves III-XII grossly intact Sensation to upper and lower extremities present, bilaterally, with differentiation to sharp and dull touch  Skin: Skin is warm and dry. No rash noted. He is not diaphoretic. No erythema.  Psychiatric: He has a normal mood and affect. His behavior is normal. Thought content normal.    ED Course  Procedures (including critical care time)  Percocet 1 tab PO and Prednisone 50 mg PO given in ED setting for pain control  Labs Reviewed - No data to display No results found.   1. Back pain   2. Chronic back pain   3. HIV infection       MDM  Patient afebrile, normotensive, non-tachycardic, non-tachypneic, adequate saturation on room air, alert and oriented. Pain controlled in ED setting. No neurological deficits noted. Patient with chronic back pain with mild exacerbation - followed by PCP and due to get pain management specialist. Discharged patient and recommended patient to get MRI of lumbar spine as outpatient. Discharged patient with prednisone and pain medications - discussed precautions with patient. Referred patient to PCP, ortho, and neurology. Discussed with patient to refrain from strenuous activity and to rest. Discussed to stay hydrated. Discussed with patient to continue monitoring symptoms and if symptoms are to worsen or change to report back to the ED.  Patient agreed to plan of care understood, all questions  answered.         Raymon Mutton, PA-C 02/07/13 1754

## 2013-02-07 NOTE — ED Notes (Signed)
Pain that started in back last night at 2 am hurts to walk

## 2013-02-10 NOTE — ED Provider Notes (Signed)
Medical screening examination/treatment/procedure(s) were performed by non-physician practitioner and as supervising physician I was immediately available for consultation/collaboration.   Charles B. Bernette Mayers, MD 02/10/13 1302

## 2013-02-20 ENCOUNTER — Encounter: Payer: Medicare Other | Admitting: Internal Medicine

## 2013-02-20 ENCOUNTER — Emergency Department (HOSPITAL_COMMUNITY)
Admission: EM | Admit: 2013-02-20 | Discharge: 2013-02-21 | Discharge status: Against Medical Advice | Payer: Medicare Other | Attending: Emergency Medicine | Admitting: Emergency Medicine

## 2013-02-20 ENCOUNTER — Encounter (HOSPITAL_COMMUNITY): Payer: Self-pay | Admitting: *Deleted

## 2013-02-20 DIAGNOSIS — K089 Disorder of teeth and supporting structures, unspecified: Secondary | ICD-10-CM | POA: Insufficient documentation

## 2013-02-20 DIAGNOSIS — K047 Periapical abscess without sinus: Secondary | ICD-10-CM | POA: Insufficient documentation

## 2013-02-20 DIAGNOSIS — Z87891 Personal history of nicotine dependence: Secondary | ICD-10-CM | POA: Insufficient documentation

## 2013-02-20 NOTE — ED Notes (Signed)
The pt is going to [place]  He is tired of waiting

## 2013-02-20 NOTE — ED Notes (Signed)
Lt. Upper and lower teeth hurting.  There is big abscess on the left side..no drainage.

## 2013-02-25 ENCOUNTER — Ambulatory Visit: Payer: Medicare Other | Admitting: Internal Medicine

## 2013-03-06 ENCOUNTER — Emergency Department (HOSPITAL_COMMUNITY)
Admission: EM | Admit: 2013-03-06 | Discharge: 2013-03-06 | Disposition: A | Discharge status: Home / Self Care | Payer: Medicare Other | Attending: Emergency Medicine | Admitting: Emergency Medicine

## 2013-03-06 ENCOUNTER — Encounter (HOSPITAL_COMMUNITY): Payer: Self-pay | Admitting: Emergency Medicine

## 2013-03-06 DIAGNOSIS — Z21 Asymptomatic human immunodeficiency virus [HIV] infection status: Secondary | ICD-10-CM | POA: Insufficient documentation

## 2013-03-06 DIAGNOSIS — Z7982 Long term (current) use of aspirin: Secondary | ICD-10-CM | POA: Insufficient documentation

## 2013-03-06 DIAGNOSIS — M545 Low back pain, unspecified: Secondary | ICD-10-CM | POA: Insufficient documentation

## 2013-03-06 DIAGNOSIS — M549 Dorsalgia, unspecified: Secondary | ICD-10-CM

## 2013-03-06 DIAGNOSIS — Z87891 Personal history of nicotine dependence: Secondary | ICD-10-CM | POA: Insufficient documentation

## 2013-03-06 DIAGNOSIS — Z79899 Other long term (current) drug therapy: Secondary | ICD-10-CM | POA: Insufficient documentation

## 2013-03-06 DIAGNOSIS — Z8701 Personal history of pneumonia (recurrent): Secondary | ICD-10-CM | POA: Insufficient documentation

## 2013-03-06 DIAGNOSIS — G8929 Other chronic pain: Secondary | ICD-10-CM

## 2013-03-06 DIAGNOSIS — Z8709 Personal history of other diseases of the respiratory system: Secondary | ICD-10-CM | POA: Insufficient documentation

## 2013-03-06 DIAGNOSIS — IMO0001 Reserved for inherently not codable concepts without codable children: Secondary | ICD-10-CM

## 2013-03-06 DIAGNOSIS — I2510 Atherosclerotic heart disease of native coronary artery without angina pectoris: Secondary | ICD-10-CM | POA: Insufficient documentation

## 2013-03-06 DIAGNOSIS — Z8739 Personal history of other diseases of the musculoskeletal system and connective tissue: Secondary | ICD-10-CM | POA: Insufficient documentation

## 2013-03-06 DIAGNOSIS — Z8661 Personal history of infections of the central nervous system: Secondary | ICD-10-CM | POA: Insufficient documentation

## 2013-03-06 DIAGNOSIS — Z8679 Personal history of other diseases of the circulatory system: Secondary | ICD-10-CM | POA: Insufficient documentation

## 2013-03-06 DIAGNOSIS — I10 Essential (primary) hypertension: Secondary | ICD-10-CM | POA: Insufficient documentation

## 2013-03-06 MED ORDER — OXYCODONE-ACETAMINOPHEN 5-325 MG PO TABS
1.0000 | ORAL_TABLET | Freq: Once | ORAL | Status: AC
  Administered 2013-03-06: 1 via ORAL
  Filled 2013-03-06: qty 1
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
## 2013-03-06 NOTE — ED Provider Notes (Signed)
History     CSN: [id number]  Arrival date & time 03/06/13  1123   First MD Initiated Contact with Patient 03/06/13 1226      Chief Complaint  Patient presents with  . Back Pain    (Consider location/radiation/quality/duration/timing/severity/associated sxs/prior treatment) HPI  Kenneth [name] is a 57 y.o. male S. medical history significant for HIV with chronic low back pain since 1995. Patient reports a exacerbation over the course of the last several days. Pain is rated at 9/10, it radiates down the lower left extremity past the knee. It is exacerbated by weightbearing. He is pending admission to a pain management clinic as arranged by his primary care Dr. but he states he has not received a phone call on this yet. Patient denies fever, difficulty ambulating, numbness, paresthesia, any change in bowel or bladder habits.  Past Medical History  Diagnosis Date  . HIV positive 2004  . Hypertension   . Meningitis   . Coronary artery disease   . Chest pain at rest 09/23/2012  . Chronic bronchitis     "q year last 5 yr or so" (09/24/2012)  . Exertional dyspnea   . Migraines   . Arthritis     "both shoulders" (09/24/2012)  . Chronic lower back pain   . Pneumonia   . Lumbar disc disease   . Iliac artery aneurysm     Right    Past Surgical History  Procedure Laterality Date  . Intussusception repair  10/2011  . Posterior lumbar fusion  1995  . Elbow surgery  ~ 1997    "removed some stones; right" (09/24/2012)  . Appendectomy  2013  . Back surgery  2006    4 BACK SURGERIES  . Abdominal surgery    . Spine surgery      Injury to back 1995    Family History  Problem Relation Age of Onset  . Heart disease Father   . Heart disease Brother   . Diabetes Brother     History  Substance Use Topics  . Smoking status: Former Smoker -- 0.10 packs/day for 45 years    Types: Cigarettes    Quit date: 09/24/2012  . Smokeless tobacco: Never Used     Comment: 09/24/2012 "been stopped now ~ 8  month".  3 per day now.  . Alcohol Use: No      Review of Systems  Constitutional: Negative for fever.  HENT: Negative for neck pain.   Gastrointestinal: Negative for nausea, vomiting and abdominal pain.  Genitourinary: Negative for dysuria and difficulty urinating.  Musculoskeletal: Positive for back pain.  Neurological: Negative for weakness and numbness.    Allergies  Bee venom; Flexeril; Hydrocodone; Nabumetone; [name]; Tramadol; Ace inhibitors; and Other  Home Medications   Current Outpatient Rx  Name  Route  Sig  Dispense  Refill  . acetaminophen (TYLENOL) 500 MG tablet   Oral   Take 1,000 mg by mouth 2 (two) times daily as needed for pain.         Marland Kitchen albuterol (PROVENTIL HFA;VENTOLIN HFA) 108 (90 BASE) MCG/ACT inhaler   Inhalation   Inhale 2 puffs into the lungs every 6 (six) hours as needed for wheezing or shortness of breath.          Marland Kitchen amLODipine (NORVASC) 5 MG tablet   Oral   Take 5 mg by mouth daily.          Marland Kitchen aspirin 81 MG chewable tablet   Oral   Chew 81 mg  by mouth daily.          . Ipratropium-Albuterol (COMBIVENT RESPIMAT) 20-100 MCG/ACT AERS respimat   Inhalation   Inhale 1 puff into the lungs 2 (two) times daily as needed for wheezing or shortness of breath.         . lisinopril (PRINIVIL,ZESTRIL) 5 MG tablet   Oral   Take 5 mg by mouth daily.          . Multiple Vitamin (MULTIVITAMIN WITH MINERALS) TABS   Oral   Take 1 tablet by mouth daily.         . nitroGLYCERIN (NITROSTAT) 0.4 MG SL tablet   Sublingual   Place 1 tablet (0.4 mg total) under the tongue every 5 (five) minutes x 3 doses as needed for chest pain.   30 tablet   2   . promethazine (PHENERGAN) 25 MG tablet   Oral   Take 1 tablet (25 mg total) by mouth every 6 (six) hours as needed for nausea.   14 tablet   0     BP 159/104  Pulse 89  Temp(Src) 97.7 F (36.5 C) (Oral)  Resp 22  SpO2 98%  Physical Exam  Nursing note and vitals  reviewed. Constitutional: He is oriented to person, place, and time. He appears well-developed and well-nourished. No distress.  HENT:  Head: Normocephalic.  Eyes: Conjunctivae and EOM are normal.  Cardiovascular: Normal rate.   Pulmonary/Chest: Effort normal. No stridor.  Musculoskeletal: Normal range of motion.  Patient is diffusely tender to palpation bilaterally in the lower lumbar area, and no spasm appreciated, straight leg raise is negative bilaterally. Strength is 5 out of 5 to lower extremities and distal sensation is grossly intact.  Neurological: He is alert and oriented to person, place, and time.  Psychiatric: He has a normal mood and affect.    ED Course  Procedures (including critical care time)  Labs Reviewed - No data to display No results found.   1. Chronic back pain   2. Elevated blood pressure       MDM   Filed Vitals:   03/06/13 1131 03/06/13 1229  BP: 154/108 159/104  Pulse: 89   Temp: 97.7 F (36.5 C)   TempSrc: Oral   Resp: 22   SpO2: 98%      Kenneth [name] is a 57 y.o. male Patient with exacerbation of chronic low back pain.  No neurological deficits and normal neuro exam.  Patient can walk but states is painful.  No loss of bowel or bladder control.  No concern for cauda equina.  No fever, night sweats, weight loss, h/o cancer, IVDU.  RICE protocol and pain medicine indicated and discussed with patient.   Patient's been seen multiple times for similar complaints. I have told him that he needs to follow with his pain management clinic for management of his chronic low back pain. Patient will be given a single Percocet pill in the ED and discharged without any prescriptions.   Medications  oxyCODONE-acetaminophen (PERCOCET/ROXICET) 5-325 MG per tablet 1 tablet (not administered)    Pt is hemodynamically stable, appropriate for, and amenable to discharge at this time. Pt verbalized understanding and agrees with care plan. Outpatient follow-up and  specific return precautions discussed.           Wynetta Emery, PA-C 03/06/13 1609

## 2013-03-06 NOTE — ED Notes (Signed)
Back pain since 95 states hyas seen a pcp and is suppose to go to pain clinic but has not gotten call

## 2013-03-07 NOTE — ED Provider Notes (Signed)
Medical screening examination/treatment/procedure(s) were performed by non-physician practitioner and as supervising physician I was immediately available for consultation/collaboration.  Geoffery Lyons, MD 03/07/13 [phone]

## 2013-03-09 ENCOUNTER — Emergency Department (HOSPITAL_COMMUNITY)
Admission: EM | Admit: 2013-03-09 | Discharge: 2013-03-09 | Disposition: A | Discharge status: Home / Self Care | Payer: Medicare Other | Attending: Emergency Medicine | Admitting: Emergency Medicine

## 2013-03-09 ENCOUNTER — Emergency Department (HOSPITAL_COMMUNITY): Payer: Medicare Other

## 2013-03-09 ENCOUNTER — Encounter (HOSPITAL_COMMUNITY): Payer: Self-pay | Admitting: Emergency Medicine

## 2013-03-09 DIAGNOSIS — S6980XA Other specified injuries of unspecified wrist, hand and finger(s), initial encounter: Secondary | ICD-10-CM | POA: Insufficient documentation

## 2013-03-09 DIAGNOSIS — Z7982 Long term (current) use of aspirin: Secondary | ICD-10-CM | POA: Insufficient documentation

## 2013-03-09 DIAGNOSIS — Z8709 Personal history of other diseases of the respiratory system: Secondary | ICD-10-CM | POA: Insufficient documentation

## 2013-03-09 DIAGNOSIS — Z79899 Other long term (current) drug therapy: Secondary | ICD-10-CM | POA: Insufficient documentation

## 2013-03-09 DIAGNOSIS — Y929 Unspecified place or not applicable: Secondary | ICD-10-CM | POA: Insufficient documentation

## 2013-03-09 DIAGNOSIS — G43909 Migraine, unspecified, not intractable, without status migrainosus: Secondary | ICD-10-CM | POA: Insufficient documentation

## 2013-03-09 DIAGNOSIS — Z87891 Personal history of nicotine dependence: Secondary | ICD-10-CM | POA: Insufficient documentation

## 2013-03-09 DIAGNOSIS — Z8701 Personal history of pneumonia (recurrent): Secondary | ICD-10-CM | POA: Insufficient documentation

## 2013-03-09 DIAGNOSIS — M129 Arthropathy, unspecified: Secondary | ICD-10-CM | POA: Insufficient documentation

## 2013-03-09 DIAGNOSIS — Z8739 Personal history of other diseases of the musculoskeletal system and connective tissue: Secondary | ICD-10-CM | POA: Insufficient documentation

## 2013-03-09 DIAGNOSIS — Z8679 Personal history of other diseases of the circulatory system: Secondary | ICD-10-CM | POA: Insufficient documentation

## 2013-03-09 DIAGNOSIS — I2510 Atherosclerotic heart disease of native coronary artery without angina pectoris: Secondary | ICD-10-CM | POA: Insufficient documentation

## 2013-03-09 DIAGNOSIS — Z21 Asymptomatic human immunodeficiency virus [HIV] infection status: Secondary | ICD-10-CM | POA: Insufficient documentation

## 2013-03-09 DIAGNOSIS — Z8669 Personal history of other diseases of the nervous system and sense organs: Secondary | ICD-10-CM | POA: Insufficient documentation

## 2013-03-09 DIAGNOSIS — Y939 Activity, unspecified: Secondary | ICD-10-CM | POA: Insufficient documentation

## 2013-03-09 DIAGNOSIS — M79644 Pain in right finger(s): Secondary | ICD-10-CM

## 2013-03-09 DIAGNOSIS — S6990XA Unspecified injury of unspecified wrist, hand and finger(s), initial encounter: Secondary | ICD-10-CM | POA: Insufficient documentation

## 2013-03-09 DIAGNOSIS — M549 Dorsalgia, unspecified: Secondary | ICD-10-CM | POA: Insufficient documentation

## 2013-03-09 DIAGNOSIS — G8929 Other chronic pain: Secondary | ICD-10-CM | POA: Insufficient documentation

## 2013-03-09 DIAGNOSIS — W230XXA Caught, crushed, jammed, or pinched between moving objects, initial encounter: Secondary | ICD-10-CM | POA: Insufficient documentation

## 2013-03-09 DIAGNOSIS — I10 Essential (primary) hypertension: Secondary | ICD-10-CM | POA: Insufficient documentation

## 2013-03-09 MED ORDER — OXYCODONE-ACETAMINOPHEN 5-325 MG PO TABS
1.0000 | ORAL_TABLET | Freq: Once | ORAL | Status: AC
  Administered 2013-03-09: 1 via ORAL
  Filled 2013-03-09: qty 1

## 2013-03-09 MED ORDER — EPINEPHRINE 0.3 MG/0.3ML IJ SOAJ: 0.3000 mg | Freq: Once | INTRAMUSCULAR | Status: DC | PRN
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
## 2013-03-09 NOTE — ED Notes (Signed)
Ice pack applied at triage 

## 2013-03-11 ENCOUNTER — Other Ambulatory Visit: Payer: Medicare Other

## 2013-03-11 NOTE — ED Provider Notes (Signed)
Medical screening examination/treatment/procedure(s) were performed by non-physician practitioner and as supervising physician I was immediately available for consultation/collaboration.  Sanchez Hemmer, MD 03/11/13 0040 
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
## 2013-03-24 ENCOUNTER — Encounter (HOSPITAL_COMMUNITY): Payer: Self-pay | Admitting: *Deleted

## 2013-03-24 ENCOUNTER — Emergency Department (HOSPITAL_COMMUNITY)
Admission: EM | Admit: 2013-03-24 | Discharge: 2013-03-24 | Disposition: A | Discharge status: Home / Self Care | Payer: Medicare Other | Attending: Emergency Medicine | Admitting: Emergency Medicine

## 2013-03-24 DIAGNOSIS — Z87891 Personal history of nicotine dependence: Secondary | ICD-10-CM | POA: Insufficient documentation

## 2013-03-24 DIAGNOSIS — Z981 Arthrodesis status: Secondary | ICD-10-CM | POA: Insufficient documentation

## 2013-03-24 DIAGNOSIS — Z8701 Personal history of pneumonia (recurrent): Secondary | ICD-10-CM | POA: Insufficient documentation

## 2013-03-24 DIAGNOSIS — I2510 Atherosclerotic heart disease of native coronary artery without angina pectoris: Secondary | ICD-10-CM | POA: Insufficient documentation

## 2013-03-24 DIAGNOSIS — Z8661 Personal history of infections of the central nervous system: Secondary | ICD-10-CM | POA: Insufficient documentation

## 2013-03-24 DIAGNOSIS — S161XXA Strain of muscle, fascia and tendon at neck level, initial encounter: Secondary | ICD-10-CM

## 2013-03-24 DIAGNOSIS — Z9889 Other specified postprocedural states: Secondary | ICD-10-CM | POA: Insufficient documentation

## 2013-03-24 DIAGNOSIS — Z7982 Long term (current) use of aspirin: Secondary | ICD-10-CM | POA: Insufficient documentation

## 2013-03-24 DIAGNOSIS — W172XXA Fall into hole, initial encounter: Secondary | ICD-10-CM | POA: Insufficient documentation

## 2013-03-24 DIAGNOSIS — Y939 Activity, unspecified: Secondary | ICD-10-CM | POA: Insufficient documentation

## 2013-03-24 DIAGNOSIS — Z8709 Personal history of other diseases of the respiratory system: Secondary | ICD-10-CM | POA: Insufficient documentation

## 2013-03-24 DIAGNOSIS — Z21 Asymptomatic human immunodeficiency virus [HIV] infection status: Secondary | ICD-10-CM | POA: Insufficient documentation

## 2013-03-24 DIAGNOSIS — Z79899 Other long term (current) drug therapy: Secondary | ICD-10-CM | POA: Insufficient documentation

## 2013-03-24 DIAGNOSIS — Z8739 Personal history of other diseases of the musculoskeletal system and connective tissue: Secondary | ICD-10-CM | POA: Insufficient documentation

## 2013-03-24 DIAGNOSIS — S46812A Strain of other muscles, fascia and tendons at shoulder and upper arm level, left arm, initial encounter: Secondary | ICD-10-CM

## 2013-03-24 DIAGNOSIS — Y929 Unspecified place or not applicable: Secondary | ICD-10-CM | POA: Insufficient documentation

## 2013-03-24 DIAGNOSIS — Z8679 Personal history of other diseases of the circulatory system: Secondary | ICD-10-CM | POA: Insufficient documentation

## 2013-03-24 DIAGNOSIS — G8929 Other chronic pain: Secondary | ICD-10-CM | POA: Insufficient documentation

## 2013-03-24 DIAGNOSIS — IMO0002 Reserved for concepts with insufficient information to code with codable children: Secondary | ICD-10-CM | POA: Insufficient documentation

## 2013-03-24 DIAGNOSIS — I10 Essential (primary) hypertension: Secondary | ICD-10-CM | POA: Insufficient documentation

## 2013-03-24 DIAGNOSIS — S43499A Other sprain of unspecified shoulder joint, initial encounter: Secondary | ICD-10-CM | POA: Insufficient documentation

## 2013-03-24 DIAGNOSIS — S139XXA Sprain of joints and ligaments of unspecified parts of neck, initial encounter: Secondary | ICD-10-CM | POA: Insufficient documentation

## 2013-03-24 DIAGNOSIS — M19019 Primary osteoarthritis, unspecified shoulder: Secondary | ICD-10-CM | POA: Insufficient documentation

## 2013-03-24 MED ORDER — IBUPROFEN 400 MG PO TABS
800.0000 mg | ORAL_TABLET | Freq: Once | ORAL | Status: AC
  Administered 2013-03-24: 800 mg via ORAL
  Filled 2013-03-24: qty 2

## 2013-03-24 MED ORDER — OXYCODONE-ACETAMINOPHEN 5-325 MG PO TABS
2.0000 | ORAL_TABLET | Freq: Once | ORAL | Status: AC
  Administered 2013-03-24: 2 via ORAL
  Filled 2013-03-24: qty 2

## 2013-03-24 MED ORDER — METHOCARBAMOL 750 MG PO TABS: 750.0000 mg | ORAL_TABLET | Freq: Four times a day (QID) | ORAL | Status: DC | PRN

## 2013-03-24 MED ORDER — OXYCODONE-ACETAMINOPHEN 5-325 MG PO TABS: 1.0000 | ORAL_TABLET | ORAL | Status: DC | PRN

## 2013-03-24 MED ORDER — METHOCARBAMOL 500 MG PO TABS
500.0000 mg | ORAL_TABLET | Freq: Once | ORAL | Status: AC
  Administered 2013-03-24: 500 mg via ORAL
  Filled 2013-03-24: qty 1

## 2013-03-24 MED ORDER — IBUPROFEN 800 MG PO TABS: 800.0000 mg | ORAL_TABLET | Freq: Three times a day (TID) | ORAL | Status: DC | PRN
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
## 2013-03-24 NOTE — ED Provider Notes (Signed)
History    This chart was scribed for non-physician practitioner Dierdre Forth, PA working with Gerhard Munch, MD by Quintella Reichert, ED Scribe. This patient was seen in room TR10C/TR10C and the patient's care was started at 7:18 PM .  CSN: [id number]  Arrival date & time 03/24/13  1435    Chief Complaint  Patient presents with  . Back Pain  . Shoulder Pain  . Neck Pain    The history is provided by the patient. No language interpreter was used.     HPI Comments: Freddie Dymek is a 57 y.o. male with h/o chronic lower back pain, arthritis in both shoulders, HIV positive infection, HTN, CAD, and chronic bronchitis who presents to the Emergency Department complaining of lower back pain, left shoulder pain and neck pain subsequent to a fall.  Pt reports that yesterday he stepped into a hole and caught himself, landing on his left forearm.  He denies head impact or LOC.  Since then his pain has been constant and progressively worsening.  It is exacerbated by moving the shoulder and he notes that when he raises his arm he feels a radiating pain from the left side of his neck to the shoulder, but not down the arm.  He reports that his back pain is similar to his chronic pain but his neck pain is new for him.  He denies weakness, numbness or tingling.  He denies pain to upper arm, elbow or forearm.  Pt notes that 2 weeks ago he ran out of Percocet that his PCP prescribed to him for his back pain.  He has attempted to treat pain with ibuprofen, without relief.  Pt notes that he is not taking any medications currently.   PCP is Dr. Denton Ar      Past Medical History  Diagnosis Date  . HIV positive 2004  . Hypertension   . Meningitis   . Coronary artery disease   . Chest pain at rest 09/23/2012  . Chronic bronchitis     "q year last 5 yr or so" (09/24/2012)  . Exertional dyspnea   . Migraines   . Arthritis     "both shoulders" (09/24/2012)  . Chronic lower back pain   . Pneumonia    . Lumbar disc disease   . Iliac artery aneurysm     Right    Past Surgical History  Procedure Laterality Date  . Intussusception repair  10/2011  . Posterior lumbar fusion  1995  . Elbow surgery  ~ 1997    "removed some stones; right" (09/24/2012)  . Appendectomy  2013  . Back surgery  2006    4 BACK SURGERIES  . Abdominal surgery    . Spine surgery      Injury to back 1995    Family History  Problem Relation Age of Onset  . Heart disease Father   . Heart disease Brother   . Diabetes Brother     History  Substance Use Topics  . Smoking status: Former Smoker -- 0.10 packs/day for 45 years    Types: Cigarettes    Quit date: 09/24/2012  . Smokeless tobacco: Never Used     Comment: 09/24/2012 "been stopped now ~ 8 month".  3 per day now.  . Alcohol Use: No     Review of Systems  Constitutional: Negative for diaphoresis, appetite change, fatigue and unexpected weight change.  HENT: Positive for neck stiffness. Negative for mouth sores.   Eyes: Negative for visual disturbance.  Respiratory: Negative for cough, chest tightness and wheezing.   Gastrointestinal: Negative for nausea, vomiting, diarrhea and constipation.  Endocrine: Negative for polydipsia, polyphagia and polyuria.  Genitourinary: Negative for urgency, frequency and hematuria.  Musculoskeletal: Positive for arthralgias.  Skin: Negative for rash.  Allergic/Immunologic: Negative for immunocompromised state.  Neurological: Negative for syncope and light-headedness.  Hematological: Does not bruise/bleed easily.  Psychiatric/Behavioral: Negative for sleep disturbance. The patient is not nervous/anxious.       Allergies  Bee venom; Flexeril; Hydrocodone; Nabumetone; [name]; Tramadol; Ace inhibitors; and Other  Home Medications   Current Outpatient Rx  Name  Route  Sig  Dispense  Refill  . acetaminophen (TYLENOL) 500 MG tablet   Oral   Take 1,000 mg by mouth 2 (two) times daily as needed for pain.          Marland Kitchen amLODipine (NORVASC) 5 MG tablet   Oral   Take 5 mg by mouth daily.          Marland Kitchen aspirin 81 MG chewable tablet   Oral   Chew 81 mg by mouth daily.          . Emtricitab-Rilpivir-Tenofovir 200-25-300 MG TABS   Oral   Take 1 tablet by mouth daily with breakfast.         . EPINEPHrine (EPI-PEN) 0.3 mg/0.3 mL SOAJ   Intramuscular   Inject 0.3 mg into the muscle once as needed (Anaphylaxis).          . Ipratropium-Albuterol (COMBIVENT RESPIMAT) 20-100 MCG/ACT AERS respimat   Inhalation   Inhale 1 puff into the lungs 2 (two) times daily as needed for wheezing or shortness of breath.         . lisinopril (PRINIVIL,ZESTRIL) 5 MG tablet   Oral   Take 5 mg by mouth daily.          . Multiple Vitamin (MULTIVITAMIN WITH MINERALS) TABS   Oral   Take 1 tablet by mouth daily.         . nitroGLYCERIN (NITROSTAT) 0.4 MG SL tablet   Sublingual   Place 0.4 mg under the tongue every 5 (five) minutes as needed for chest pain.         . promethazine (PHENERGAN) 25 MG tablet   Oral   Take 25 mg by mouth every 6 (six) hours as needed for nausea.         Marland Kitchen albuterol (PROVENTIL HFA;VENTOLIN HFA) 108 (90 BASE) MCG/ACT inhaler   Inhalation   Inhale 2 puffs into the lungs every 6 (six) hours as needed for wheezing or shortness of breath.          Marland Kitchen ibuprofen (ADVIL,MOTRIN) 800 MG tablet   Oral   Take 1 tablet (800 mg total) by mouth every 8 (eight) hours as needed for pain.   30 tablet   0   . methocarbamol (ROBAXIN) 750 MG tablet   Oral   Take 1 tablet (750 mg total) by mouth 4 (four) times daily as needed (Take 1 tablet every 6 hours as needed for muscle spasms.).   20 tablet   0   . oxyCODONE-acetaminophen (PERCOCET) 5-325 MG per tablet   Oral   Take 1 tablet by mouth every 4 (four) hours as needed for pain (Take 1- 2 tablets every 4 - 6 hours as needed for pain.).   15 tablet   0    BP 150/108  Pulse 82  Temp(Src) 98.1 F (36.7 C) (Oral)  Resp 20  SpO2  98%  Physical Exam  Nursing note and vitals reviewed. Constitutional: He appears well-developed and well-nourished. No distress.  HENT:  Head: Normocephalic and atraumatic.  Mouth/Throat: Oropharynx is clear and moist. No oropharyngeal exudate.  Eyes: Conjunctivae and EOM are normal. Pupils are equal, round, and reactive to light. No scleral icterus.  Neck: Neck supple. No spinous process tenderness and no muscular tenderness present. No rigidity. Normal range of motion present.  Full range of motion of the C-spine with mild pain  Cardiovascular: Normal rate, regular rhythm, normal heart sounds and intact distal pulses.   No murmur heard. Pulmonary/Chest: Effort normal and breath sounds normal. No respiratory distress. He has no decreased breath sounds. He has no wheezes. He has no rhonchi. He exhibits no tenderness and no bony tenderness.  No tenderness the left side of his chest  Abdominal: Soft. Bowel sounds are normal. He exhibits no distension. There is no tenderness.  Musculoskeletal: He exhibits tenderness.  Tender to palpation across entire left trapezius Pain on palpation to paraspinal muscles of left C-spine Full ROM of the left shoulder with pain Full range of motion of the T-spine and L-spine No tenderness to palpation of the spinous processes of the T-spine or L-spine No tenderness to palpation of the paraspinous muscles of the L-spine  Lymphadenopathy:    He has no cervical adenopathy.  Neurological: He is alert. He has normal reflexes.  Speech is clear and goal oriented, follows commands Normal strength in upper and lower extremities bilaterally including dorsiflexion and plantar flexion, strong and equal grip strength Sensation normal to light and sharp touch Moves extremities without ataxia, coordination intact Normal gait Normal balance   Skin: Skin is warm and dry. No rash noted. He is not diaphoretic. No erythema.    ED Course  Procedures (including critical  care time)  DIAGNOSTIC STUDIES: Oxygen Saturation is 98% on room air, normal by my interpretation.    COORDINATION OF CARE: 7:23 PM-Discussed treatment plan which includes pain medication, anti-inflammatories and muscle relaxants with pt at bedside and pt agreed to plan.     Labs Reviewed - No data to display  No results found.  1. Cervical strain, initial encounter [847.0]   2. Trapezius strain, left, initial encounter     MDM  [name] presents with cervical and trapezius pain after fall.  Patient with cervical back pain.  No neurological deficits and normal neuro exam.  Patient can walk but states is painful.  No loss of bowel or bladder control.  No fever, night sweats, weight loss, h/o cancer, IVDU.  Pt with negative NEXUS (no focal feurologic deficit, midline spinal tenderness, ALOC, intoxication or distracting injury); no imaging indicated.  RICE protocol and pain medicine indicated and discussed with patient. Inquiry on the a controlled substance database shows patient's last prescription for percocet was on 02/07/2013 and prior to that he got morphine by mouth on 02/05/13. Patient with trapezius strain and cervical strain. Will discharge home with ibuprofen, Robaxin and Percocet. Patient is to followup with his primary care physician. I have also discussed reasons to return immediately to the ER.  Patient expresses understanding and agrees with plan.   I personally performed the services described in this documentation, which was scribed in my presence. The recorded information has been reviewed and is accurate.   Dahlia Client Deone Omahoney, PA-C 03/24/13 1943

## 2013-03-25 ENCOUNTER — Ambulatory Visit (INDEPENDENT_AMBULATORY_CARE_PROVIDER_SITE_OTHER): Payer: Medicare Other | Admitting: Internal Medicine

## 2013-03-25 ENCOUNTER — Encounter: Payer: Self-pay | Admitting: Internal Medicine

## 2013-03-25 VITALS — BP 170/112 | HR 67 | Temp 97.7°F | Ht 68.0 in | Wt 155.0 lb

## 2013-03-25 DIAGNOSIS — Z21 Asymptomatic human immunodeficiency virus [HIV] infection status: Secondary | ICD-10-CM
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
## 2013-03-25 NOTE — ED Provider Notes (Signed)
Medical screening examination/treatment/procedure(s) were performed by non-physician practitioner and as supervising physician I was immediately available for consultation/collaboration.  Gerhard Munch, MD 03/25/13 [phone]

## 2013-03-26 LAB — HIV-1 RNA QUANT-NO REFLEX-BLD
HIV 1 RNA Quant: 557 copies/mL — ABNORMAL HIGH (ref <20)
HIV-1 RNA Quant, Log: 2.75 {Log} — ABNORMAL HIGH (ref <1.30)

## 2013-03-26 LAB — T-HELPER CELL (CD4) - (RCID CLINIC ONLY)
CD4 % Helper T Cell: 36 % (ref 33–55)
CD4 T Cell Abs: 740 uL (ref 400–2700)

## 2013-03-29 ENCOUNTER — Encounter (HOSPITAL_COMMUNITY): Payer: Self-pay | Admitting: *Deleted

## 2013-03-29 ENCOUNTER — Emergency Department (HOSPITAL_COMMUNITY): Payer: Medicare Other

## 2013-03-29 ENCOUNTER — Emergency Department (HOSPITAL_COMMUNITY)
Admission: EM | Admit: 2013-03-29 | Discharge: 2013-03-29 | Disposition: A | Discharge status: Home / Self Care | Payer: Medicare Other | Attending: Emergency Medicine | Admitting: Emergency Medicine

## 2013-03-29 DIAGNOSIS — Y9301 Activity, walking, marching and hiking: Secondary | ICD-10-CM | POA: Insufficient documentation

## 2013-03-29 DIAGNOSIS — S239XXA Sprain of unspecified parts of thorax, initial encounter: Secondary | ICD-10-CM | POA: Insufficient documentation

## 2013-03-29 DIAGNOSIS — Z7982 Long term (current) use of aspirin: Secondary | ICD-10-CM | POA: Insufficient documentation

## 2013-03-29 DIAGNOSIS — Z21 Asymptomatic human immunodeficiency virus [HIV] infection status: Secondary | ICD-10-CM | POA: Insufficient documentation

## 2013-03-29 DIAGNOSIS — R519 Headache, unspecified: Secondary | ICD-10-CM

## 2013-03-29 DIAGNOSIS — Z8709 Personal history of other diseases of the respiratory system: Secondary | ICD-10-CM | POA: Insufficient documentation

## 2013-03-29 DIAGNOSIS — S46912D Strain of unspecified muscle, fascia and tendon at shoulder and upper arm level, left arm, subsequent encounter: Secondary | ICD-10-CM

## 2013-03-29 DIAGNOSIS — IMO0002 Reserved for concepts with insufficient information to code with codable children: Secondary | ICD-10-CM | POA: Insufficient documentation

## 2013-03-29 DIAGNOSIS — S139XXA Sprain of joints and ligaments of unspecified parts of neck, initial encounter: Secondary | ICD-10-CM | POA: Insufficient documentation

## 2013-03-29 DIAGNOSIS — Z8701 Personal history of pneumonia (recurrent): Secondary | ICD-10-CM | POA: Insufficient documentation

## 2013-03-29 DIAGNOSIS — S29019D Strain of muscle and tendon of unspecified wall of thorax, subsequent encounter: Secondary | ICD-10-CM

## 2013-03-29 DIAGNOSIS — Z8661 Personal history of infections of the central nervous system: Secondary | ICD-10-CM | POA: Insufficient documentation

## 2013-03-29 DIAGNOSIS — Z87891 Personal history of nicotine dependence: Secondary | ICD-10-CM | POA: Insufficient documentation

## 2013-03-29 DIAGNOSIS — S0990XA Unspecified injury of head, initial encounter: Secondary | ICD-10-CM | POA: Insufficient documentation

## 2013-03-29 DIAGNOSIS — Z8739 Personal history of other diseases of the musculoskeletal system and connective tissue: Secondary | ICD-10-CM | POA: Insufficient documentation

## 2013-03-29 DIAGNOSIS — Y9289 Other specified places as the place of occurrence of the external cause: Secondary | ICD-10-CM | POA: Insufficient documentation

## 2013-03-29 DIAGNOSIS — Z8679 Personal history of other diseases of the circulatory system: Secondary | ICD-10-CM | POA: Insufficient documentation

## 2013-03-29 DIAGNOSIS — S161XXD Strain of muscle, fascia and tendon at neck level, subsequent encounter: Secondary | ICD-10-CM

## 2013-03-29 DIAGNOSIS — I2510 Atherosclerotic heart disease of native coronary artery without angina pectoris: Secondary | ICD-10-CM | POA: Insufficient documentation

## 2013-03-29 DIAGNOSIS — W172XXA Fall into hole, initial encounter: Secondary | ICD-10-CM | POA: Insufficient documentation

## 2013-03-29 DIAGNOSIS — Z79899 Other long term (current) drug therapy: Secondary | ICD-10-CM | POA: Insufficient documentation

## 2013-03-29 DIAGNOSIS — I10 Essential (primary) hypertension: Secondary | ICD-10-CM | POA: Insufficient documentation

## 2013-03-29 LAB — POCT I-STAT, CHEM 8
BUN: 13 mg/dL (ref 6–23)
Calcium, Ion: 1.21 mmol/L (ref 1.12–1.23)
Chloride: 108 mEq/L (ref 96–112)
Creatinine, Ser: 1 mg/dL (ref 0.50–1.35)
Glucose, Bld: 87 mg/dL (ref 70–99)
HCT: 45 % (ref 39.0–52.0)
Hemoglobin: 15.3 g/dL (ref 13.0–17.0)
Potassium: 3.7 mEq/L (ref 3.5–5.1)
Sodium: 141 mEq/L (ref 135–145)
TCO2: 24 mmol/L (ref 0–100)

## 2013-03-29 MED ORDER — HYDROMORPHONE HCL PF 2 MG/ML IJ SOLN
2.0000 mg | Freq: Once | INTRAMUSCULAR | Status: AC
  Administered 2013-03-29: 2 mg via INTRAMUSCULAR
  Filled 2013-03-29: qty 1

## 2013-03-29 MED ORDER — OXYCODONE-ACETAMINOPHEN 5-325 MG PO TABS: 1.0000 | ORAL_TABLET | ORAL | Status: DC | PRN

## 2013-03-29 MED ORDER — ORPHENADRINE CITRATE ER 100 MG PO TB12: 100.0000 mg | ORAL_TABLET | Freq: Two times a day (BID) | ORAL | Status: DC

## 2013-03-29 MED ORDER — DIAZEPAM 5 MG PO TABS
5.0000 mg | ORAL_TABLET | Freq: Once | ORAL | Status: AC
  Administered 2013-03-29: 5 mg via ORAL
  Filled 2013-03-29: qty 1

## 2013-03-29 NOTE — ED Provider Notes (Signed)
History    CSN: [id number] Arrival date & time 03/29/13  1530  First MD Initiated Contact with Patient 03/29/13 1632     No chief complaint on file.  (Consider location/radiation/quality/duration/timing/severity/associated sxs/prior Treatment) HPI Comments: Pt w/ multiple co morbidities significant for HIV (CD4 >500, undetectable viral load - per pt), chronic back pain now w/ HA, neck and back pain after fall. States 6 days ago was walking in grass and stepped in hole. Landed awkwardly on left arm/shoulder. Denies hitting head or LOC. Seen in ED the following day. Given NSAIDs and percocet. Ran out of percocet last pm. Has been noting progressive worsening of bilateral shoulder, mid back and neck pain. Last pm noted gradual on set of headache. Denies upper ext radiculopathy or weakness. Admits to fever x 1 yesterday - temp 103 oral. Denies confusion or current fever. Only c/o severe pain in neck and back. Has been seen in ED multiple times and multiple LPs. Has chronic neck pain and is often concerned for meningitis.   Patient is a 57 y.o. male presenting with injury. The history is provided by the patient. No language interpreter was used.  Injury This is a new problem. The current episode started in the past 7 days. The problem occurs constantly. The problem has been gradually worsening. Associated symptoms include arthralgias, headaches, myalgias and neck pain. Pertinent negatives include no abdominal pain, chest pain, chills, congestion, coughing, fever, nausea, rash, sore throat or vomiting. He has tried nothing for the symptoms. The treatment provided no relief.   Past Medical History  Diagnosis Date  . HIV positive 2004  . Hypertension   . Meningitis   . Coronary artery disease   . Chest pain at rest 09/23/2012  . Chronic bronchitis     "q year last 5 yr or so" (09/24/2012)  . Exertional dyspnea   . Migraines   . Arthritis     "both shoulders" (09/24/2012)  . Chronic lower back pain     . Pneumonia   . Lumbar disc disease   . Iliac artery aneurysm     Right   Past Surgical History  Procedure Laterality Date  . Intussusception repair  10/2011  . Posterior lumbar fusion  1995  . Elbow surgery  ~ 1997    "removed some stones; right" (09/24/2012)  . Appendectomy  2013  . Back surgery  2006    4 BACK SURGERIES  . Abdominal surgery    . Spine surgery      Injury to back 1995   Family History  Problem Relation Age of Onset  . Heart disease Father   . Heart disease Brother   . Diabetes Brother    History  Substance Use Topics  . Smoking status: Former Smoker -- 0.10 packs/day for 45 years    Types: Cigarettes    Quit date: 09/24/2012  . Smokeless tobacco: Never Used     Comment: 09/24/2012 "been stopped now ~ 8 month".  3 per day now.  . Alcohol Use: No    Review of Systems  Constitutional: Negative for fever and chills.  HENT: Positive for neck pain. Negative for congestion and sore throat.   Respiratory: Negative for cough and shortness of breath.   Cardiovascular: Negative for chest pain and leg swelling.  Gastrointestinal: Negative for nausea, vomiting, abdominal pain, diarrhea and constipation.  Genitourinary: Negative for dysuria and frequency.  Musculoskeletal: Positive for myalgias and arthralgias.  Skin: Negative for color change and rash.  Neurological: Positive  for headaches. Negative for dizziness.  Psychiatric/Behavioral: Negative for confusion and agitation.  All other systems reviewed and are negative.    Allergies  Bee venom; Flexeril; Hydrocodone; Nabumetone; [name]; Tramadol; Ace inhibitors; and Other  Home Medications   Current Outpatient Rx  Name  Route  Sig  Dispense  Refill  . acetaminophen (TYLENOL) 500 MG tablet   Oral   Take 1,000 mg by mouth 2 (two) times daily as needed for pain.         Marland Kitchen albuterol (PROVENTIL HFA;VENTOLIN HFA) 108 (90 BASE) MCG/ACT inhaler   Inhalation   Inhale 2 puffs into the lungs every 6 (six)  hours as needed for wheezing or shortness of breath.          Marland Kitchen amLODipine (NORVASC) 5 MG tablet   Oral   Take 5 mg by mouth daily.          Marland Kitchen aspirin 81 MG chewable tablet   Oral   Chew 81 mg by mouth daily.          . Emtricitab-Rilpivir-Tenofovir 200-25-300 MG TABS   Oral   Take 1 tablet by mouth daily with breakfast.         . EPINEPHrine (EPI-PEN) 0.3 mg/0.3 mL SOAJ   Intramuscular   Inject 0.3 mg into the muscle once as needed (Anaphylaxis).          Marland Kitchen ibuprofen (ADVIL,MOTRIN) 800 MG tablet   Oral   Take 1 tablet (800 mg total) by mouth every 8 (eight) hours as needed for pain.   30 tablet   0   . Ipratropium-Albuterol (COMBIVENT RESPIMAT) 20-100 MCG/ACT AERS respimat   Inhalation   Inhale 1 puff into the lungs 2 (two) times daily as needed for wheezing or shortness of breath.         . lisinopril (PRINIVIL,ZESTRIL) 5 MG tablet   Oral   Take 5 mg by mouth daily.          . methocarbamol (ROBAXIN) 750 MG tablet   Oral   Take 1 tablet (750 mg total) by mouth 4 (four) times daily as needed (Take 1 tablet every 6 hours as needed for muscle spasms.).   20 tablet   0   . Multiple Vitamin (MULTIVITAMIN WITH MINERALS) TABS   Oral   Take 1 tablet by mouth daily.         . nitroGLYCERIN (NITROSTAT) 0.4 MG SL tablet   Sublingual   Place 0.4 mg under the tongue every 5 (five) minutes as needed for chest pain.         Marland Kitchen oxyCODONE-acetaminophen (PERCOCET) 5-325 MG per tablet   Oral   Take 1 tablet by mouth every 4 (four) hours as needed for pain (Take 1- 2 tablets every 4 - 6 hours as needed for pain.).   15 tablet   0   . promethazine (PHENERGAN) 25 MG tablet   Oral   Take 25 mg by mouth every 6 (six) hours as needed for nausea.          BP 188/123  Pulse 72  Temp(Src) 97.8 F (36.6 C) (Oral)  Resp 18  SpO2 100% Physical Exam  Constitutional: He is oriented to person, place, and time. He appears well-developed and well-nourished. No  distress.  HENT:  Head: Normocephalic and atraumatic.  Eyes: EOM are normal. Pupils are equal, round, and reactive to light.  Neck: Normal range of motion. Neck supple. Spinous process tenderness and muscular tenderness present.  Cardiovascular: Normal rate and regular rhythm.   Pulmonary/Chest: Effort normal. No respiratory distress.  Abdominal: Soft. He exhibits no distension.  Musculoskeletal: He exhibits no edema.       Left shoulder: He exhibits decreased range of motion, tenderness and bony tenderness.       Thoracic back: He exhibits decreased range of motion, tenderness, bony tenderness and spasm.       Back:       Arms: Neurological: He is alert and oriented to person, place, and time. He has normal strength. No cranial nerve deficit or sensory deficit. Coordination normal.  Skin: Skin is warm and dry.  Psychiatric: He has a normal mood and affect. His behavior is normal.    ED Course  Procedures (including critical care time) Labs Reviewed - No data to display No results found. No diagnosis found. Results for orders placed during the hospital encounter of 03/29/13  POCT I-STAT, CHEM 8      Result Value Range   Sodium 141  135 - 145 mEq/L   Potassium 3.7  3.5 - 5.1 mEq/L   Chloride 108  96 - 112 mEq/L   BUN 13  6 - 23 mg/dL   Creatinine, Ser 1.19  0.50 - 1.35 mg/dL   Glucose, Bld 87  70 - 99 mg/dL   Calcium, Ion 1.47  8.29 - 1.23 mmol/L   TCO2 24  0 - 100 mmol/L   Hemoglobin 15.3  13.0 - 17.0 g/dL   HCT 56.2  13.0 - 86.5 %    DG Cervical Spine 2-3 Views (Final result)  Result time: 03/29/13 18:54:23    Final result by Rad Results In Interface (03/29/13 18:54:23)    Narrative:   *RADIOLOGY REPORT*  Clinical Data: Fall  CERVICAL SPINE - 2-3 VIEW  Comparison: CT 11/24/2012  Findings: Disc degeneration and spondylosis from C3-C7, unchanged. Mild posterior slip of C4-5 also unchanged.  Negative for fracture.  IMPRESSION: Cervical spondylosis. Negative  for fracture.   Original Report Authenticated By: Janeece Riggers, M.D.             DG Shoulder Left (Final result)  Result time: 03/29/13 18:55:13    Final result by Rad Results In Interface (03/29/13 18:55:13)    Narrative:   *RADIOLOGY REPORT*  Clinical Data: Fall  LEFT SHOULDER - 2+ VIEW  Comparison: None.  Findings: Negative for fracture. Normal alignment of the shoulder joint. No significant degenerative change.  IMPRESSION: Negative   Original Report Authenticated By: Janeece Riggers, M.D.             DG Thoracic Spine W/Swimmers (Final result)  Result time: 03/29/13 18:53:13    Procedure changed from [facility] Thoracic Spine 2 View       Final result by Rad Results In Interface (03/29/13 18:53:13)    Narrative:   *RADIOLOGY REPORT*  Clinical Data: Fall  THORACIC SPINE - 2 VIEW + SWIMMERS  Comparison: Chest 01/28/2013  Findings: Negative for thoracic fracture. Minimal degenerative change and spurring. No focal bony lesion.  IMPRESSION: Negative for fracture.   Original Report Authenticated By: Janeece Riggers, M.D.     MDM  Exam as above - NAD, afebrile, well appearing, non toxic. ttp bilateral paraspinal and trapezius, mild midline cervical and thoracic tenderness, no stepoff. ttp left GH joint w/ antalgic ROM. Neck stiffness - likely 2/2 muscle spasm. Doubt meningitis - pt is afebrile, CD4 >500. No focal neuro deficit on exam. HTN likely 2/2 pain - denies dyspnea/chest pain or decreased UOP.  HA likely 2/2 muscle spasm from cervical spine. Doubt end organ failure. Will treat pain and spasm w/ valium and dilaudid and reassess. Will refrain from CT head and ECG. Will obtain istat chem 8. Will obtain screening 2v cervical and thoracic films and xray left shoulder.    Xray - no acute findings - no fx or dislocation. Pain relieved w/ meds. Now no nuchal rigidity. HA resolved.  Again - doubt meningitis. Likely cervical/thoracic and shoulder strain. Stable for d/c  home. Given rx for small quant percocet until able to be seen by pcp on Monday. Pt not tolerating robaxin - given rx for norflex. Recommend ice/heat. D/c in good and stable condition.   I have personally reviewed labs and imaging and considered in my MDM. Case d/w Dr Jeraldine Loots.  1. Cervical strain, acute, subsequent encounter   2. Shoulder strain, left, subsequent encounter   3. Thoracic myofascial strain, subsequent encounter   4. Headache    Discharge Medication List as of 03/29/2013  7:32 PM    START taking these medications   Details  orphenadrine (NORFLEX) 100 MG tablet Take 1 tablet (100 mg total) by mouth 2 (two) times daily., Starting 03/29/2013, Until Discontinued, Print    !! oxyCODONE-acetaminophen (PERCOCET) 5-325 MG per tablet Take 1 tablet by mouth every 4 (four) hours as needed for pain., Starting 03/29/2013, Until Discontinued, Print     !! - Potential duplicate medications found. Please discuss with provider.     Otis Brace, MD [id number] [facility] [place] [address] [phone]  Schedule an appointment as soon as possible for a visit       Audelia Hives, MD 03/29/13 [phone]

## 2013-03-29 NOTE — ED Notes (Signed)
Pt was seen here Monday with neck and back pain and states now he is developing a headache.  Pt was referred to ED.  Pt states headache is at the top of his head and it started this am.

## 2013-03-29 NOTE — ED Notes (Signed)
Patient transported to X-ray 

## 2013-03-29 NOTE — ED Notes (Signed)
States tried to follow up on Friday with PCP, but no cancellations so did not see MD. Told by office to follow up in ER, but he did not want to wait in ER on Friday, so he dealt with it until today. Supposed to see PCP on 04/04/13 and she wanted him to have xrays of shoulders and neck done so they would be available to her at his appointment.

## 2013-03-29 NOTE — ED Provider Notes (Signed)
I evaluated the patient in conjunction with the resident physician.  I saw the relevant studies, including the ecg (as needed) and agree with the interpretation. The documentation is accurate.  Gerhard Munch, MD 03/29/13 [phone]

## 2013-04-04 ENCOUNTER — Ambulatory Visit (INDEPENDENT_AMBULATORY_CARE_PROVIDER_SITE_OTHER): Payer: Medicare Other | Admitting: Internal Medicine

## 2013-04-04 ENCOUNTER — Encounter: Payer: Self-pay | Admitting: Internal Medicine

## 2013-04-04 VITALS — BP 166/108 | HR 81 | Temp 97.1°F | Ht 69.0 in | Wt 162.1 lb

## 2013-04-04 DIAGNOSIS — M545 Low back pain, unspecified: Secondary | ICD-10-CM

## 2013-04-04 DIAGNOSIS — G8929 Other chronic pain: Secondary | ICD-10-CM

## 2013-04-04 DIAGNOSIS — J438 Other emphysema: Secondary | ICD-10-CM

## 2013-04-04 DIAGNOSIS — I10 Essential (primary) hypertension: Secondary | ICD-10-CM

## 2013-04-04 DIAGNOSIS — I509 Heart failure, unspecified: Secondary | ICD-10-CM

## 2013-04-04 DIAGNOSIS — I5042 Chronic combined systolic (congestive) and diastolic (congestive) heart failure: Secondary | ICD-10-CM

## 2013-04-04 DIAGNOSIS — G609 Hereditary and idiopathic neuropathy, unspecified: Secondary | ICD-10-CM

## 2013-04-04 DIAGNOSIS — Z1211 Encounter for screening for malignant neoplasm of colon: Secondary | ICD-10-CM

## 2013-04-04 DIAGNOSIS — J439 Emphysema, unspecified: Secondary | ICD-10-CM

## 2013-04-04 MED ORDER — PRAVASTATIN SODIUM 40 MG PO TABS: 40.0000 mg | ORAL_TABLET | Freq: Every day | ORAL | Status: DC

## 2013-04-04 MED ORDER — HYDROCHLOROTHIAZIDE 25 MG PO TABS
25.0000 mg | ORAL_TABLET | Freq: Every day | ORAL | Status: DC
Start: 2013-04-04 — End: 2013-06-11

## 2013-04-04 MED ORDER — AMLODIPINE BESYLATE 5 MG PO TABS: 5.0000 mg | ORAL_TABLET | Freq: Every day | ORAL | Status: DC

## 2013-04-04 MED ORDER — OXYCODONE-ACETAMINOPHEN 5-325 MG PO TABS: 1.0000 | ORAL_TABLET | Freq: Three times a day (TID) | ORAL | Status: DC | PRN

## 2013-04-04 MED ORDER — LISINOPRIL 5 MG PO TABS: 5.0000 mg | ORAL_TABLET | Freq: Every day | ORAL | Status: DC

## 2013-04-04 NOTE — Patient Instructions (Addendum)
General Instructions: - Take Percocet every 8 hrs as needed for pain, - Start taking HCTZ 25mg  daily for your high blood pressure, I have refilled you amlodipine and lisinopril  -Start taking Pravastatin 40mg  daily for high lipids, we will check you labs in 2 weeks when you come in  -Will refer you for nerve conduction test for your left foot numbness -Will refer you to get a pulmonary function test to test how your COPD is doing  -Will see you in 2 weeks to monitor your blood pressure and possibly start you on a beta blocker, pending your drug urine test today -To come back in 4 weeks for percocet refill and urine drug test - It was nice meeting you!   Treatment Goals:  Goals (1 Years of Data) as of 04/04/13     Lifestyle    . Prevent Falls       Progress Toward Treatment Goals:  Treatment Goal 04/04/2013  Blood pressure unchanged    Self Care Goals & Plans:  Self Care Goal 04/04/2013  Manage my medications take my medicines as prescribed; refill my medications on time  Monitor my health keep track of my blood pressure  Eat healthy foods eat baked foods instead of fried foods; eat foods that are low in salt  Be physically active find an activity I enjoy       Care Management & Community Referrals:  Referral 04/04/2013  Referrals made for care management support none needed

## 2013-04-04 NOTE — Progress Notes (Signed)
Patient ID: Kenneth [name], male   DOB: [birth year], 57 y.o.   MRN: [id number]   Subjective:   Patient ID: Kenneth [name] male   DOB: 1956/02/24 57 y.o.   MRN: [id number]  HPI: [name] is a 57 y.o. 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 Pt reports he recently fell into a ditch and landed on his left shoulder and forearm resulting in worsening pain and stiffnes of his neck, shoulders, and mid to lower back necessitating ED visits on 7/7 and 7/12. Pt was found to have no fractures or meningitis (LP could not be performed). He was given a short course of percocet which he has since finished. Pt describes 8/10 aching neck pain, 7/10 bilateral shoulder pain, and 9/10 shooting pain in his mid to lower back, non-radiating to his legs. Pt has had previous MRI (2010 & 2013) of cervical, thoracic, and lumbar spine revealing spondylosis, stenosis, and osteophytes. Pt uses Tylenol 500mg  twice daily for pain. He was previously using ibuprofen 800mg  three times daily but stopped due to stomach upset.  He reports also using Valium 5mg  for muscle spasms. He denies lower extremity weakness, saddle anesthesia, incontinence of bowels and bladder, and sciatica type pain. He does report intermittent numbness and tingling in left foot that began about 3 months ago. He previously (May 2014) signed a pain management contract agreement to receive MS contin on a monthly basis but due to headaches stopped using it. We have not prescribed further narcotics since that time. He was referrrd to [facility] pain management clinic but was denied acceptance because he was not deemed appropriate. He reports quitting  cocaine use for the past 9-12 months.   Pt recently restarted on HIV medication Complera  and is compliant. His latest CD4 count is 740 (uptrending from 6 months ago) and viral load is 557 copies (uptrending from 6 months ago). No fevers, chills, night sweats, weight loss, tender or enlarged lymph nodes, odynophagia, dyphagia, abdominal pain, nausea, vomiting, or changes in BM or urination.  No opportunistic infections to date from HIV infection.   Pt also requests refill of his blood pressure medications, lisinopril and amlodipine, which he is compliant with taking. He does not check his blood pressure at home but has been told it is high. No CP, leg swelling, HA, diaphoresis, lightheadedness, or palpitations.   Pt's COPD has been well controlled. Does not recall ever having PFT done in the past He reports in the past month he has to use his rescue inhaler only once a month vs normally twice a month. No dyspnea at rest or with exertion, cough, or wheezing.      Pt had colonoscopy in April with removal of three adenomatous polyps and will require repeat colonoscopy in 3 years.     Past Medical History  Diagnosis Date  . HIV positive 2004  . Hypertension   . Meningitis   . Coronary artery disease   . Chest pain at rest 09/23/2012  . Chronic bronchitis     "q year last 5 yr or so" (09/24/2012)  . Exertional dyspnea   . Migraines   . Arthritis     "both shoulders" (09/24/2012)  .  Chronic lower back pain   . Pneumonia   . Lumbar disc disease   . Iliac artery aneurysm     Right   Current Outpatient Prescriptions  Medication Sig Dispense Refill  . acetaminophen (TYLENOL) 500 MG tablet Take 1,000 mg by mouth 2 (two) times daily as needed for pain.      Marland Kitchen albuterol (PROVENTIL HFA;VENTOLIN HFA) 108 (90 BASE) MCG/ACT inhaler Inhale 2 puffs into the lungs every 6 (six) hours as needed for wheezing or shortness of breath.       Marland Kitchen amLODipine (NORVASC) 5 MG tablet Take 5 mg by mouth daily.       Marland Kitchen  aspirin 81 MG chewable tablet Chew 81 mg by mouth daily.       . Emtricitab-Rilpivir-Tenofovir 200-25-300 MG TABS Take 1 tablet by mouth daily with breakfast.      . EPINEPHrine (EPI-PEN) 0.3 mg/0.3 mL SOAJ Inject 0.3 mg into the muscle once as needed (Anaphylaxis).       Marland Kitchen ibuprofen (ADVIL,MOTRIN) 800 MG tablet Take 1 tablet (800 mg total) by mouth every 8 (eight) hours as needed for pain.  30 tablet  0  . Ipratropium-Albuterol (COMBIVENT RESPIMAT) 20-100 MCG/ACT AERS respimat Inhale 1 puff into the lungs 2 (two) times daily as needed for wheezing or shortness of breath.      . lisinopril (PRINIVIL,ZESTRIL) 5 MG tablet Take 5 mg by mouth daily.       . methocarbamol (ROBAXIN) 750 MG tablet Take 1 tablet (750 mg total) by mouth 4 (four) times daily as needed (Take 1 tablet every 6 hours as needed for muscle spasms.).  20 tablet  0  . Multiple Vitamin (MULTIVITAMIN WITH MINERALS) TABS Take 1 tablet by mouth daily.      . nitroGLYCERIN (NITROSTAT) 0.4 MG SL tablet Place 0.4 mg under the tongue every 5 (five) minutes as needed for chest pain.      . orphenadrine (NORFLEX) 100 MG tablet Take 1 tablet (100 mg total) by mouth 2 (two) times daily.  20 tablet  0  . oxyCODONE-acetaminophen (PERCOCET) 5-325 MG per tablet Take 1 tablet by mouth every 4 (four) hours as needed for pain (Take 1- 2 tablets every 4 - 6 hours as needed for pain.).  15 tablet  0  . oxyCODONE-acetaminophen (PERCOCET) 5-325 MG per tablet Take 1 tablet by mouth every 4 (four) hours as needed for pain.  10 tablet  0  . promethazine (PHENERGAN) 25 MG tablet Take 25 mg by mouth every 6 (six) hours as needed for nausea.       No current facility-administered medications for this visit.   Family History  Problem Relation Age of Onset  . Heart disease Father   . Heart disease Brother   . Diabetes Brother    History   Social History  . Marital Status: Married    Spouse Name: N/A    Number of Children: N/A  . Years of Education: N/A     Social History Main Topics  . Smoking status: Former Smoker -- 0.10 packs/day for 45 years    Types: Cigarettes    Quit date: 09/24/2012  . Smokeless tobacco: Never Used     Comment: 09/24/2012 "been stopped now ~ 8 month".  3 per day now.  . Alcohol Use: No  . Drug Use: Yes    Special: Cocaine     Comment: last use 09/24/2012  . Sexually Active: Not on file     Comment: declined  condoms   Other Topics Concern  . Not on file   Social History Narrative  . No narrative on file   Review of Systems: Review of Systems  Constitutional: Positive for weight loss (8lb wt gain in 3 months). Negative for fever, chills, malaise/fatigue and diaphoresis.  HENT: Positive for neck pain. Negative for congestion and sore throat.   Eyes: Negative for blurred vision.  Respiratory: Negative for cough, shortness of breath and wheezing.   Cardiovascular: Negative for chest pain, palpitations and leg swelling.  Gastrointestinal: Negative for nausea, vomiting, abdominal pain, diarrhea, constipation, blood in stool and melena.  Genitourinary: Negative for dysuria, urgency, frequency and hematuria.  Musculoskeletal: Positive for back pain and falls. Negative for myalgias.  Skin: Negative for rash.  Neurological: Positive for tingling (left foot for past 3 months) and sensory change (left foot for past 3 months). Negative for dizziness and headaches.  Psychiatric/Behavioral: Negative for substance abuse.    Objective:  Physical Exam: Physical Exam  Constitutional: He is oriented to person, place, and time. He appears well-developed and well-nourished. No distress.  HENT:  Head: Normocephalic and atraumatic.  Right Ear: External ear normal.  Nose: Nose normal.  Mouth/Throat: Oropharynx is clear and moist. No oropharyngeal exudate.  Eyes: Conjunctivae and EOM are normal. Pupils are equal, round, and reactive to light. Right eye exhibits no discharge. Left eye exhibits no discharge. No scleral icterus.   Neck: Neck supple. No JVD present. No tracheal deviation present. No thyromegaly present.  Reduced ROM of neck   Cardiovascular: Normal rate, regular rhythm, normal heart sounds and intact distal pulses.   No murmur heard. Pulmonary/Chest: Effort normal and breath sounds normal. No stridor. No respiratory distress. He has no wheezes. He has no rales. He exhibits no tenderness.  Abdominal: Soft. Bowel sounds are normal. He exhibits no distension and no mass. There is no tenderness. There is no rebound and no guarding.  Musculoskeletal: He exhibits tenderness (tenderness to palpation of trapezius muslce L>R, paraspinal thoaracic and lumbar spine  tenderness). He exhibits no edema.  Lymphadenopathy:    He has no cervical adenopathy.  Neurological: He is alert and oriented to person, place, and time. He has normal reflexes. No cranial nerve deficit.  Decreased sensation to light touch of left foot   Skin: Skin is warm and dry. No rash noted. He is not diaphoretic. No erythema.  Psychiatric: He has a normal mood and affect. His behavior is normal.   Filed Vitals:   04/04/13 1452  BP: 166/108  Pulse: 81  Temp: 97.1 F (36.2 C)  TempSrc: Oral  Height: 5\' 9"  (1.753 m)  Weight: 162 lb 1.6 oz (73.528 kg)  SpO2: 99%    Assessment & Plan:   Please see problem list for problem based assessment and plan ---discussed with attending physician Dr. Criselda Peaches

## 2013-04-05 DIAGNOSIS — G609 Hereditary and idiopathic neuropathy, unspecified: Secondary | ICD-10-CM

## 2013-04-05 HISTORY — DX: Hereditary and idiopathic neuropathy, unspecified: G60.9

## 2013-04-05 LAB — PRESCRIPTION ABUSE MONITORING 15P, URINE
Amphetamine/Meth: NEGATIVE ng/mL
Barbiturate Screen, Urine: NEGATIVE ng/mL
Benzodiazepine Screen, Urine: NEGATIVE ng/mL
Buprenorphine, Urine: NEGATIVE ng/mL
Cannabinoid Scrn, Ur: NEGATIVE ng/mL
Carisoprodol, Urine: NEGATIVE ng/mL
Creatinine, Urine: 70.81 mg/dL (ref >20.0)
Fentanyl, Ur: NEGATIVE ng/mL
Meperidine, Ur: NEGATIVE ng/mL
Methadone Screen, Urine: NEGATIVE ng/mL
Opiate Screen, Urine: NEGATIVE ng/mL
Oxycodone Screen, Ur: NEGATIVE ng/mL
Propoxyphene: NEGATIVE ng/mL
Tramadol Scrn, Ur: NEGATIVE ng/mL
Zolpidem, Urine: NEGATIVE ng/mL

## 2013-04-05 NOTE — Assessment & Plan Note (Addendum)
Assessment: Pt with grade I diastolic dysfunction and mild to moderate systolic dysfunction (EF 40-45%)  on  2D-Echo on 12/15/12. Previously started on Lipitor and stopped due to nausea and vomiting. However, last LDL from 12/14/12 is elevated (134) and will likely benefit from statin therapy.  Not previously on beta blocker due to risk of coronary vasoconstriction as a result of  unopposed alpha-receptor stimulation with cocaine use. Pt reports quitting cocaine for 9-12 months.  Will consider cardioselective beta blocker, such as carvedilol at next visit once urine drug screen test results are back. Cardioselective beta blocker in mild to moderate COPD has low risk of adverse respiratory effects.    Plan: -To obtain urine drug test today - If negative will initiate cardioselective beta blocker at next visit in 2 weeks  -Will start Pravastatin 40mg  daily for elevated LDL--will obtain CMP next visit in 2 weeks to assess liver function

## 2013-04-05 NOTE — Assessment & Plan Note (Addendum)
Assessment: Pt with uncontrolled stage 2 hypertension on 5mg  daily lisinopril and 5mg  daily amlodipine. Today 166/108, BP >160 systolic in recent ED visits in past month.    Plan:  -Will start HCTZ 25mg  daily and reassess in 2 weeks, recent BMP in ED with normal Na & K -Will refill current bp meds--> 5mg  lisinopril &  5mg  amlodipine

## 2013-04-05 NOTE — Assessment & Plan Note (Signed)
Assessment: Pt with recent worsening of chronic back and neck pain due to fall. Pain attributable to multiple spinal pathology at level of cervical, thoracic, and lumbar spine, including spondylosis, stensois, and osteophytes. Previously signed pain management contract in May for supply of MS Contin but stopped due to headaches. In recent ED visits was prescribed short course of percocet and muscle relaxant. Denied referral to [facility] pain management clinic. Has stopped using cocaine for past 9-12 months.   Plan:  -Will refer to alternate pain management clinics -Urine drug screen test today -Pt agreed to and signed pain management contract for 30 day supply of percocet 5-325 mg Q8 hr with refills dependent on UDT each month and compliance with other stated requirements

## 2013-04-05 NOTE — Assessment & Plan Note (Signed)
Assessment: Pt with intermittent numbness and tingling of left foot for past 3 months. Due to h/o HIV infection, want to r/o HIV-associated sensory neuropathies.   Plan: -To refer to Neurology for nerve conduction testing to determine etiology of neuropathy

## 2013-04-05 NOTE — Assessment & Plan Note (Addendum)
Assessment: Pt with controlled COPD, not on home oxygen or oral steroid therapy, and does not report dyspnea at rest or with exertion, cough, wheezing, orthopnea, PND,  or LE edema.  He reports using his rescue inhaler 1x last month, usually uses it 2x per month.      Plan:  -Referral to obtain PFT (TLC, FEV1/FVC, and DLCO)  to determine severity of COPD, no past record of previous pulmonary function testing

## 2013-04-08 NOTE — Progress Notes (Signed)
I saw and evaluated the patient.  I personally confirmed the key portions of the history and exam documented by Dr. Rabbani and I reviewed pertinent patient test results.  The assessment, diagnosis, and plan were formulated together and I agree with the documentation in the resident's note.  

## 2013-04-09 LAB — COCAINE METABOLITE (GC/LC/MS), URINE: Benzoylecgonine GC/MS Conf: 228 ng/mL — ABNORMAL HIGH

## 2013-04-18 ENCOUNTER — Encounter: Payer: Self-pay | Admitting: Internal Medicine

## 2013-04-18 ENCOUNTER — Ambulatory Visit: Payer: Medicare Other | Admitting: Internal Medicine

## 2013-04-24 ENCOUNTER — Encounter: Payer: Self-pay | Admitting: *Deleted

## 2013-05-02 ENCOUNTER — Other Ambulatory Visit: Payer: Medicare Other

## 2013-05-02 DIAGNOSIS — M545 Low back pain, unspecified: Secondary | ICD-10-CM

## 2013-05-02 DIAGNOSIS — G8929 Other chronic pain: Secondary | ICD-10-CM

## 2013-05-03 LAB — PRESCRIPTION ABUSE MONITORING 15P, URINE
Amphetamine/Meth: NEGATIVE ng/mL
Barbiturate Screen, Urine: NEGATIVE ng/mL
Benzodiazepine Screen, Urine: NEGATIVE ng/mL
Buprenorphine, Urine: NEGATIVE ng/mL
Cannabinoid Scrn, Ur: NEGATIVE ng/mL
Carisoprodol, Urine: NEGATIVE ng/mL
Creatinine, Urine: 158.75 mg/dL (ref >20.0)
Fentanyl, Ur: NEGATIVE ng/mL
Meperidine, Ur: NEGATIVE ng/mL
Methadone Screen, Urine: NEGATIVE ng/mL
Opiate Screen, Urine: NEGATIVE ng/mL
Oxycodone Screen, Ur: NEGATIVE ng/mL
Propoxyphene: NEGATIVE ng/mL
Tramadol Scrn, Ur: NEGATIVE ng/mL
Zolpidem, Urine: NEGATIVE ng/mL

## 2013-05-06 ENCOUNTER — Other Ambulatory Visit: Payer: Self-pay | Admitting: *Deleted

## 2013-05-06 DIAGNOSIS — M545 Low back pain, unspecified: Secondary | ICD-10-CM

## 2013-05-06 DIAGNOSIS — G8929 Other chronic pain: Secondary | ICD-10-CM

## 2013-05-06 NOTE — Telephone Encounter (Addendum)
Mr Pop showed up at the clinic for med refill; I informed him UDS was positive and he ask w/what?  When I told him +cocaine; he seemed Surprised. And I will have Dr Johna Roles to call him. He said ok. [phone]

## 2013-05-06 NOTE — Telephone Encounter (Signed)
At 8/15 visit, pt stated cocaine free for 8-12 months but UDs + cocaine. Per note, RF dependent on UDs results. I will not fill. I will CC Dr Mercy Moore and let the two of you decide how to inform pt.

## 2013-05-06 NOTE — Telephone Encounter (Signed)
Pt had urine drug screen done 8/15.

## 2013-05-07 ENCOUNTER — Telehealth: Payer: Self-pay | Admitting: Internal Medicine

## 2013-05-07 LAB — COCAINE METABOLITE (GC/LC/MS), URINE: Benzoylecgonine GC/MS Conf: 188 ng/mL — ABNORMAL HIGH

## 2013-05-07 NOTE — Telephone Encounter (Signed)
I called Mr. Kenneth [name] on 05/06/13 to inform him that I would not be able to refill his narcotic prescription due to failure to pass mandatory urine drug test as requirement of the pain contract he signed last month. I  gave him a prescription for 1 month supply of oxycodone last month and at end of visit urin drug screen was done which was later resulted as positive for cocaine. This past Friday he had urine drug test again which was also positive for cocaine.  Since he signed the pain contract agreeing to be drug-free in order to refill his 1 month supply of narcotics, I am no longer able to refill his oxycodone at this time. He was able to understood why I was not refilling his medication and reported his neck pain has improved and will use OTC medications instead. I told him that we were still searching for pain management clinic for him.

## 2013-05-08 ENCOUNTER — Other Ambulatory Visit: Payer: Medicare Other

## 2013-05-09 ENCOUNTER — Inpatient Hospital Stay (HOSPITAL_COMMUNITY): Admission: RE | Admit: 2013-05-09 | Payer: Medicare Other | Source: Ambulatory Visit

## 2013-05-21 NOTE — Addendum Note (Signed)
Addended by: Neomia Dear on: 05/21/2013 09:51 AM   Modules accepted: Orders

## 2013-05-21 NOTE — Addendum Note (Signed)
Addended by: Neomia Dear on: 05/21/2013 09:50 AM   Modules accepted: Orders

## 2013-05-22 ENCOUNTER — Ambulatory Visit: Payer: Medicare Other | Admitting: Internal Medicine

## 2013-05-22 ENCOUNTER — Encounter: Payer: Self-pay | Admitting: *Deleted

## 2013-05-22 ENCOUNTER — Telehealth: Payer: Self-pay | Admitting: *Deleted

## 2013-05-22 NOTE — Telephone Encounter (Signed)
Made return appts for MD and Lab.

## 2013-05-26 ENCOUNTER — Encounter (HOSPITAL_COMMUNITY): Payer: Self-pay | Admitting: Emergency Medicine

## 2013-05-26 ENCOUNTER — Emergency Department (HOSPITAL_COMMUNITY)
Admission: EM | Admit: 2013-05-26 | Discharge: 2013-05-26 | Disposition: A | Discharge status: Home / Self Care | Payer: Medicare Other | Attending: Emergency Medicine | Admitting: Emergency Medicine

## 2013-05-26 DIAGNOSIS — Z7982 Long term (current) use of aspirin: Secondary | ICD-10-CM | POA: Insufficient documentation

## 2013-05-26 DIAGNOSIS — I10 Essential (primary) hypertension: Secondary | ICD-10-CM | POA: Insufficient documentation

## 2013-05-26 DIAGNOSIS — M19019 Primary osteoarthritis, unspecified shoulder: Secondary | ICD-10-CM | POA: Insufficient documentation

## 2013-05-26 DIAGNOSIS — M545 Low back pain, unspecified: Secondary | ICD-10-CM | POA: Insufficient documentation

## 2013-05-26 DIAGNOSIS — G43909 Migraine, unspecified, not intractable, without status migrainosus: Secondary | ICD-10-CM | POA: Insufficient documentation

## 2013-05-26 DIAGNOSIS — I2510 Atherosclerotic heart disease of native coronary artery without angina pectoris: Secondary | ICD-10-CM | POA: Insufficient documentation

## 2013-05-26 DIAGNOSIS — Z8661 Personal history of infections of the central nervous system: Secondary | ICD-10-CM | POA: Insufficient documentation

## 2013-05-26 DIAGNOSIS — M542 Cervicalgia: Secondary | ICD-10-CM | POA: Insufficient documentation

## 2013-05-26 DIAGNOSIS — Z21 Asymptomatic human immunodeficiency virus [HIV] infection status: Secondary | ICD-10-CM | POA: Insufficient documentation

## 2013-05-26 DIAGNOSIS — J42 Unspecified chronic bronchitis: Secondary | ICD-10-CM | POA: Insufficient documentation

## 2013-05-26 DIAGNOSIS — Z79899 Other long term (current) drug therapy: Secondary | ICD-10-CM | POA: Insufficient documentation

## 2013-05-26 DIAGNOSIS — G8929 Other chronic pain: Secondary | ICD-10-CM | POA: Insufficient documentation

## 2013-05-26 DIAGNOSIS — Z87891 Personal history of nicotine dependence: Secondary | ICD-10-CM | POA: Insufficient documentation

## 2013-05-26 DIAGNOSIS — Z8701 Personal history of pneumonia (recurrent): Secondary | ICD-10-CM | POA: Insufficient documentation

## 2013-05-26 MED ORDER — ACETAMINOPHEN 325 MG PO TABS
650.0000 mg | ORAL_TABLET | Freq: Once | ORAL | Status: AC
  Administered 2013-05-26: 650 mg via ORAL
  Filled 2013-05-26: qty 2

## 2013-05-26 NOTE — ED Provider Notes (Signed)
Medical screening examination/treatment/procedure(s) were performed by non-physician practitioner and as supervising physician I was immediately available for consultation/collaboration.   Harinder Romas, MD 05/26/13 2116 

## 2013-05-26 NOTE — ED Provider Notes (Signed)
CSN: [id number]     Arrival date & time 05/26/13  1653 History  This chart was scribed for non-physician practitioner Wynetta Emery, PA-C working with Loren Racer, MD by Leone Payor, ED Scribe. This patient was seen in room TR06C/TR06C and the patient's care was started at 1653.    Chief Complaint  Patient presents with  . Back Pain    The history is provided by the patient. No language interpreter was used.    HPI Comments: Kenneth [name] is a 57 y.o. male who presents to the Emergency Department complaining of chronic new episode of constant, unchanged exacerbation of chronic low back pain  and neck pain that began today.  Pt has h/o back surgeries. Denies numbness, weakness, fever, change in bowel or bladder habits, history of IV drug use, history of cancer . Pt is a former smoker but denies alcohol use. Patient also denies any chest pain, shortness of breath, change in vision, change in bowel or bladder habits  Past Medical History  Diagnosis Date  . HIV positive 2004  . Hypertension   . Meningitis   . Coronary artery disease   . Chest pain at rest 09/23/2012  . Chronic bronchitis     "q year last 5 yr or so" (09/24/2012)  . Exertional dyspnea   . Migraines   . Arthritis     "both shoulders" (09/24/2012)  . Chronic lower back pain   . Pneumonia   . Lumbar disc disease   . Iliac artery aneurysm     Right   Past Surgical History  Procedure Laterality Date  . Intussusception repair  10/2011  . Posterior lumbar fusion  1995  . Elbow surgery  ~ 1997    "removed some stones; right" (09/24/2012)  . Appendectomy  2013  . Back surgery  2006    4 BACK SURGERIES  . Abdominal surgery    . Spine surgery      Injury to back 1995   Family History  Problem Relation Age of Onset  . Heart disease Father   . Heart disease Brother   . Diabetes Brother    History  Substance Use Topics  . Smoking status: Former Smoker -- 0.10 packs/day for 45 years    Types: Cigarettes    Quit date:  09/24/2012  . Smokeless tobacco: Never Used     Comment: 09/24/2012 "been stopped now ~ 8 month".  3 per day now.  . Alcohol Use: No    Review of Systems  Constitutional: Negative for fever.  Respiratory: Negative for shortness of breath.   Cardiovascular: Negative for chest pain.  Gastrointestinal: Negative for nausea, vomiting, abdominal pain and diarrhea.  All other systems reviewed and are negative.   A complete 10 system review of systems was obtained and all systems are negative except as noted in the HPI and PMH.   Allergies  Bee venom; Flexeril; Hydrocodone; Nabumetone; [name]; Tramadol; Ace inhibitors; and Other  Home Medications   Current Outpatient Rx  Name  Route  Sig  Dispense  Refill  . acetaminophen (TYLENOL) 500 MG tablet   Oral   Take 1,000 mg by mouth 2 (two) times daily as needed for pain.         Marland Kitchen albuterol (PROVENTIL HFA;VENTOLIN HFA) 108 (90 BASE) MCG/ACT inhaler   Inhalation   Inhale 2 puffs into the lungs every 6 (six) hours as needed for wheezing or shortness of breath.          Marland Kitchen amLODipine (  NORVASC) 5 MG tablet   Oral   Take 1 tablet (5 mg total) by mouth daily.   30 tablet   10   . aspirin 81 MG chewable tablet   Oral   Chew 81 mg by mouth daily.          . Emtricitab-Rilpivir-Tenofovir 200-25-300 MG TABS   Oral   Take 1 tablet by mouth daily with breakfast.         . hydrochlorothiazide (HYDRODIURIL) 25 MG tablet   Oral   Take 1 tablet (25 mg total) by mouth daily.   30 tablet   10   . Ipratropium-Albuterol (COMBIVENT RESPIMAT) 20-100 MCG/ACT AERS respimat   Inhalation   Inhale 1 puff into the lungs 2 (two) times daily as needed for wheezing or shortness of breath.         . lisinopril (PRINIVIL,ZESTRIL) 5 MG tablet   Oral   Take 1 tablet (5 mg total) by mouth daily.   30 tablet   10   . Multiple Vitamin (MULTIVITAMIN WITH MINERALS) TABS   Oral   Take 1 tablet by mouth daily.         . promethazine (PHENERGAN)  25 MG tablet   Oral   Take 25 mg by mouth every 6 (six) hours as needed for nausea.         Marland Kitchen EPINEPHrine (EPI-PEN) 0.3 mg/0.3 mL SOAJ   Intramuscular   Inject 0.3 mg into the muscle once as needed (Anaphylaxis).          . nitroGLYCERIN (NITROSTAT) 0.4 MG SL tablet   Sublingual   Place 0.4 mg under the tongue every 5 (five) minutes as needed for chest pain.          BP 171/119  Pulse 91  Temp(Src) 97.6 F (36.4 C) (Oral)  Resp 18  SpO2 97% Physical Exam  Nursing note and vitals reviewed. Constitutional: He is oriented to person, place, and time. He appears well-developed and well-nourished. No distress.  HENT:  Head: Normocephalic and atraumatic.  Mouth/Throat: Oropharynx is clear and moist.  Eyes: Conjunctivae and EOM are normal. Pupils are equal, round, and reactive to light.  Neck: Normal range of motion.  Cardiovascular: Normal rate, regular rhythm and intact distal pulses.   Pulmonary/Chest: Effort normal and breath sounds normal. No stridor. No respiratory distress. He has no wheezes. He has no rales. He exhibits no tenderness.  Abdominal: Soft. Bowel sounds are normal. He exhibits no distension and no mass. There is no tenderness. There is no rebound and no guarding.  Musculoskeletal: Normal range of motion.  Strength is 5 out of 5 to bilateral lower extremities at hip and knee, extensor hallucis longus 5 out of 5. Ankle strength 5 out of 5, no clonus, neurovascularly intact.   Neurological: He is alert and oriented to person, place, and time.  Cranial nerves III through XII intact, strength 5 out of 5x4 extremities, negative pronator drift, finger to nose and heel-to-shin coordinated, sensation intact to pinprick and light touch, gait is coordinated and Romberg is negative.   Psychiatric: He has a normal mood and affect.    ED Course  Procedures (including critical care time)  DIAGNOSTIC STUDIES: Oxygen Saturation is 97% on RA, normal by my interpretation.     COORDINATION OF CARE: 6:19 PM Discussed treatment plan with pt at bedside and pt agreed to plan.   Labs Review Labs Reviewed - No data to display Imaging Review No results found.  MDM  1. Chronic back pain greater than 3 months duration     Filed Vitals:   05/26/13 1702  BP: 171/119  Pulse: 91  Temp: 97.6 F (36.4 C)  TempSrc: Oral  Resp: 18  SpO2: 97%     Hallis Meditz is a 57 y.o. male  With  19 visits in the last 6 months for chronic low back pain. Patient does not have any red flags for dangerous or acute conditions. Physical exam is also reassuring.Marland Kitchen He states that he has not been able to establish care with the pain management clinic. I have advised him that we will no longer be giving him any narcotic pain medication for non-acute complaints. Patient verbalizes understanding and said that he would followup with his primary care physician for chronic pain. I instructed him that he is welcome to return to the emergency room at any time for evaluation and medical screening.   I have advised patient it is important to take his antihypertensive medications as directed. Patient has significantly elevated blood pressure today at 171/119. Patient is asymptomatic at this time. I've also advised him to please follow with his primary care doctor for further evaluation of poorly controlled hypertension.   Medications  acetaminophen (TYLENOL) tablet 650 mg (650 mg Oral Given 05/26/13 1830)    Pt is hemodynamically stable, appropriate for, and amenable to discharge at this time. Pt verbalized understanding and agrees with care plan. All questions answered. Outpatient follow-up and specific return precautions discussed.    I personally performed the services described in this documentation, which was scribed in my presence. The recorded information has been reviewed and is accurate.  Note: Portions of this report may have been transcribed using voice recognition software. Every effort  was made to ensure accuracy; however, inadvertent computerized transcription errors may be present    Wynetta Emery, PA-C 05/26/13 1910

## 2013-05-26 NOTE — ED Notes (Signed)
Pt here for lower back pain and neck pain chronic in nature

## 2013-05-26 NOTE — Addendum Note (Signed)
Addended by: Neomia Dear on: 05/26/2013 08:41 PM   Modules accepted: Orders

## 2013-05-27 ENCOUNTER — Other Ambulatory Visit: Payer: Self-pay | Admitting: Internal Medicine

## 2013-05-28 ENCOUNTER — Other Ambulatory Visit: Payer: Medicare Other

## 2013-06-04 ENCOUNTER — Encounter: Payer: Self-pay | Admitting: *Deleted

## 2013-06-11 ENCOUNTER — Emergency Department (HOSPITAL_COMMUNITY)
Admission: EM | Admit: 2013-06-11 | Discharge: 2013-06-11 | Disposition: A | Discharge status: Home / Self Care | Payer: Medicare Other | Attending: Emergency Medicine | Admitting: Emergency Medicine

## 2013-06-11 ENCOUNTER — Encounter (HOSPITAL_COMMUNITY): Payer: Self-pay

## 2013-06-11 DIAGNOSIS — Z21 Asymptomatic human immunodeficiency virus [HIV] infection status: Secondary | ICD-10-CM | POA: Insufficient documentation

## 2013-06-11 DIAGNOSIS — Z79899 Other long term (current) drug therapy: Secondary | ICD-10-CM | POA: Insufficient documentation

## 2013-06-11 DIAGNOSIS — R42 Dizziness and giddiness: Secondary | ICD-10-CM | POA: Insufficient documentation

## 2013-06-11 DIAGNOSIS — G8929 Other chronic pain: Secondary | ICD-10-CM | POA: Insufficient documentation

## 2013-06-11 DIAGNOSIS — I2510 Atherosclerotic heart disease of native coronary artery without angina pectoris: Secondary | ICD-10-CM | POA: Insufficient documentation

## 2013-06-11 DIAGNOSIS — Z8701 Personal history of pneumonia (recurrent): Secondary | ICD-10-CM | POA: Insufficient documentation

## 2013-06-11 DIAGNOSIS — M129 Arthropathy, unspecified: Secondary | ICD-10-CM | POA: Insufficient documentation

## 2013-06-11 DIAGNOSIS — G43909 Migraine, unspecified, not intractable, without status migrainosus: Secondary | ICD-10-CM | POA: Insufficient documentation

## 2013-06-11 DIAGNOSIS — Z7982 Long term (current) use of aspirin: Secondary | ICD-10-CM | POA: Insufficient documentation

## 2013-06-11 DIAGNOSIS — Z8709 Personal history of other diseases of the respiratory system: Secondary | ICD-10-CM | POA: Insufficient documentation

## 2013-06-11 DIAGNOSIS — I10 Essential (primary) hypertension: Secondary | ICD-10-CM | POA: Insufficient documentation

## 2013-06-11 DIAGNOSIS — Z87891 Personal history of nicotine dependence: Secondary | ICD-10-CM | POA: Insufficient documentation

## 2013-06-11 DIAGNOSIS — R519 Headache, unspecified: Secondary | ICD-10-CM

## 2013-06-11 LAB — BASIC METABOLIC PANEL
BUN: 15 mg/dL (ref 6–23)
CO2: 23 mEq/L (ref 19–32)
Calcium: 8.8 mg/dL (ref 8.4–10.5)
Chloride: 109 mEq/L (ref 96–112)
Creatinine, Ser: 0.86 mg/dL (ref 0.50–1.35)
GFR calc Af Amer: >90 mL/min (ref >90)
GFR calc non Af Amer: >90 mL/min (ref >90)
Glucose, Bld: 124 mg/dL — ABNORMAL HIGH (ref 70–99)
Potassium: 3.8 mEq/L (ref 3.5–5.1)
Sodium: 141 mEq/L (ref 135–145)

## 2013-06-11 LAB — CBC
HCT: 43.7 % (ref 39.0–52.0)
Hemoglobin: 14.8 g/dL (ref 13.0–17.0)
MCH: 27.8 pg (ref 26.0–34.0)
MCHC: 33.9 g/dL (ref 30.0–36.0)
MCV: 82 fL (ref 78.0–100.0)
Platelets: 295 10*3/uL (ref 150–400)
RBC: 5.33 MIL/uL (ref 4.22–5.81)
RDW: 15.6 % — ABNORMAL HIGH (ref 11.5–15.5)
WBC: 6.8 10*3/uL (ref 4.0–10.5)

## 2013-06-11 MED ORDER — ACETAMINOPHEN 500 MG PO TABS
1000.0000 mg | ORAL_TABLET | Freq: Once | ORAL | Status: AC
  Administered 2013-06-11: 1000 mg via ORAL
  Filled 2013-06-11: qty 2

## 2013-06-11 MED ORDER — DIPHENHYDRAMINE HCL 25 MG PO CAPS
25.0000 mg | ORAL_CAPSULE | Freq: Once | ORAL | Status: AC
  Administered 2013-06-11: 25 mg via ORAL
  Filled 2013-06-11: qty 1

## 2013-06-11 MED ORDER — PROCHLORPERAZINE MALEATE 10 MG PO TABS
10.0000 mg | ORAL_TABLET | Freq: Once | ORAL | Status: AC
  Administered 2013-06-11: 10 mg via ORAL
  Filled 2013-06-11 (×2): qty 1

## 2013-06-11 MED ORDER — BUTALBITAL-APAP-CAFFEINE 50-325-40 MG PO TABS: 1.0000 | ORAL_TABLET | Freq: Three times a day (TID) | ORAL | Status: DC | PRN

## 2013-06-11 MED ORDER — KETOROLAC TROMETHAMINE 30 MG/ML IJ SOLN
30.0000 mg | Freq: Once | INTRAMUSCULAR | Status: AC
  Administered 2013-06-11: 30 mg via INTRAMUSCULAR
  Filled 2013-06-11: qty 1

## 2013-06-11 NOTE — ED Provider Notes (Signed)
CSN: [id number]     Arrival date & time 06/11/13  1728 History   First MD Initiated Contact with Patient 06/11/13 1750     Chief Complaint  Patient presents with  . Headache  . Dizziness   (Consider location/radiation/quality/duration/timing/severity/associated sxs/prior Treatment) HPI 57 year old male with past medical history of HIV (CD4 740, viral load 557, 03/25/13) , hypertension, coronary artery disease and migraines that presents with a complaint of a headache. He reports that he first noticed the headache when he got up to go to the restroom last night. The headache did not wake him up, but he just noticed that his head was hurting at that time. He was able to go back to sleep. He woke up in the morning and noticed that his head was still hurting. He states that he feels like he has "lumps and bumps" on the top of his head. The headache is located on the right side of his head posteriorly and is intermittent in nature. He has had similar headaches before, but reports that this one is worse than those. He does report mild photophobia. No photophobia, tinnitus, visual loss, vision changes, eye pain, jaw pain, numbness, weakness, difficulty walking, falls, trauma, fevers, chills, rash, nausea, vomiting.  Past Medical History  Diagnosis Date  . HIV positive 2004  . Hypertension   . Meningitis   . Coronary artery disease   . Chest pain at rest 09/23/2012  . Chronic bronchitis     "q year last 5 yr or so" (09/24/2012)  . Exertional dyspnea   . Migraines   . Arthritis     "both shoulders" (09/24/2012)  . Chronic lower back pain   . Pneumonia   . Lumbar disc disease   . Iliac artery aneurysm     Right   Past Surgical History  Procedure Laterality Date  . Intussusception repair  10/2011  . Posterior lumbar fusion  1995  . Elbow surgery  ~ 1997    "removed some stones; right" (09/24/2012)  . Appendectomy  2013  . Back surgery  2006    4 BACK SURGERIES  . Abdominal surgery    . Spine  surgery      Injury to back 1995   Family History  Problem Relation Age of Onset  . Heart disease Father   . Heart disease Brother   . Diabetes Brother    History  Substance Use Topics  . Smoking status: Former Smoker -- 0.10 packs/day for 45 years    Types: Cigarettes    Quit date: 09/24/2012  . Smokeless tobacco: Never Used     Comment: 09/24/2012 "been stopped now ~ 8 month".  3 per day now.  . Alcohol Use: No    Review of Systems  Constitutional: Negative for fever and chills.  HENT: Negative for sore throat and neck stiffness.   Eyes: Negative for pain and visual disturbance.  Respiratory: Negative for chest tightness and shortness of breath.   Cardiovascular: Negative for chest pain and palpitations.  Gastrointestinal: Negative for nausea, vomiting, abdominal pain and diarrhea.  Genitourinary: Negative for dysuria and frequency.  Musculoskeletal: Negative for back pain and joint swelling.  Skin: Negative for rash.  Neurological: Negative for dizziness, syncope, weakness, light-headedness, numbness and headaches.  All other systems reviewed and are negative.    Allergies  Bee venom; Flexeril; Hydrocodone; Nabumetone; [name]; Tramadol; Ace inhibitors; and Other  Home Medications   Current Outpatient Rx  Name  Route  Sig  Dispense  Refill  .  acetaminophen (TYLENOL) 500 MG tablet   Oral   Take 1,000 mg by mouth 2 (two) times daily as needed for pain.         Marland Kitchen albuterol (PROVENTIL HFA;VENTOLIN HFA) 108 (90 BASE) MCG/ACT inhaler   Inhalation   Inhale 2 puffs into the lungs every 6 (six) hours as needed for wheezing or shortness of breath.          Marland Kitchen amLODipine (NORVASC) 5 MG tablet   Oral   Take 1 tablet (5 mg total) by mouth daily.   30 tablet   10   . aspirin 81 MG chewable tablet   Oral   Chew 81 mg by mouth daily.          . Emtricitab-Rilpivir-Tenofovir 200-25-300 MG TABS   Oral   Take 1 tablet by mouth daily with breakfast.         .  EPINEPHrine (EPI-PEN) 0.3 mg/0.3 mL SOAJ   Intramuscular   Inject 0.3 mg into the muscle once as needed (Anaphylaxis).          . hydrochlorothiazide (HYDRODIURIL) 25 MG tablet   Oral   Take 1 tablet (25 mg total) by mouth daily.   30 tablet   10   . Ipratropium-Albuterol (COMBIVENT RESPIMAT) 20-100 MCG/ACT AERS respimat   Inhalation   Inhale 1 puff into the lungs 2 (two) times daily as needed for wheezing or shortness of breath.         . lisinopril (PRINIVIL,ZESTRIL) 5 MG tablet   Oral   Take 1 tablet (5 mg total) by mouth daily.   30 tablet   10   . Multiple Vitamin (MULTIVITAMIN WITH MINERALS) TABS   Oral   Take 1 tablet by mouth daily.         . nitroGLYCERIN (NITROSTAT) 0.4 MG SL tablet   Sublingual   Place 0.4 mg under the tongue every 5 (five) minutes as needed for chest pain.         . promethazine (PHENERGAN) 25 MG tablet   Oral   Take 25 mg by mouth every 6 (six) hours as needed for nausea.          BP 155/102  Pulse 101  Temp(Src) 98 F (36.7 C) (Oral)  Resp 16  SpO2 97% Physical Exam  Vitals reviewed. Constitutional: He is oriented to person, place, and time. He appears well-developed and well-nourished. No distress.  HENT:  Head: Normocephalic.  Right Ear: External ear normal.  Left Ear: External ear normal.  Nose: Nose normal.  Mouth/Throat: Oropharynx is clear and moist. No oropharyngeal exudate.  Eyes: Conjunctivae and EOM are normal. Pupils are equal, round, and reactive to light.  Neck: Normal range of motion. Neck supple. No spinous process tenderness and no muscular tenderness present. No Brudzinski's sign and no Kernig's sign noted.  Cardiovascular: Normal rate, regular rhythm, normal heart sounds and intact distal pulses.  Exam reveals no gallop and no friction rub.   No murmur heard. Pulmonary/Chest: Effort normal and breath sounds normal.  Abdominal: Soft. Bowel sounds are normal. He exhibits no distension. There is no  tenderness.  Musculoskeletal: Normal range of motion. He exhibits no edema and no tenderness.  Neurological: He is alert and oriented to person, place, and time. He has normal strength. No cranial nerve deficit or sensory deficit. Gait normal.  Skin: Skin is warm and dry.  Psychiatric: He has a normal mood and affect.    ED Course  Procedures (including critical  care time) Labs Review Labs Reviewed  CBC - Abnormal; Notable for the following:    RDW 15.6 (*)    All other components within normal limits  BASIC METABOLIC PANEL - Abnormal; Notable for the following:    Glucose, Bld 124 (*)    All other components within normal limits   Imaging Review No results found.  MDM   57 year old male with past medical history of HIV (CD4 740, viral load 557, 03/25/13) , hypertension, coronary artery disease and migraines that presents with a complaint of a headache.  On chart review he is visited this emergency department 17 times in the past 6 months.  He has chronic pain and recently had his pain contract voided due to positive UDS x 2 for cocaine.  Diff Dx: Migraine, Meningitis, Glaucoma, Temporal Arteritis, Tension HA, Hypertensive Emergency, Cluster HA, stress.  Doubt Glaucoma as no vision changes, visual fields intact to confrontation, no halos.  Doubt Meningitis/Encephalitis as no fevers, no neck stiffness, no meningeal signs.  Doubt Temporal Arteritis as no temporal tenderness, no jaw pain, no vision changes.  Pt's BP well controlled so not HE.    Will tx with compazine/benadryl PO and reassess.  8:30 PM Patient reports that his headache is improved, but as I began discussing discharge he requested "IV medicines". He was given Toradol. He continued asking for medications after this. He then began requesting narcotics for his headache. I explained to him that I did not feel that narcotics were indicated for his condition. Given his normal neurologic exam, improvement in his headache and  normal workup today it is felt he is stable for discharge with outpatient followup.Return precautions were reviewed.  All questions were answered.  Clinical Impression: 1. Headache     Disposition: Discharge  Condition: Good  I have discussed the results, Dx and Tx plan. They expressed understanding and agree with the plan and were told to return to ED with any worsening of condition or concern.    Discharge Medication List as of 06/11/2013  8:22 PM    START taking these medications   Details  butalbital-acetaminophen-caffeine (FIORICET) 50-325-40 MG per tablet Take 1-2 tablets by mouth every 8 (eight) hours as needed for headache., Starting 06/11/2013, Until Thu 06/11/14, Print        Follow Up: Otis Brace, MD [address] [place] [address] [phone]  Schedule an appointment as soon as possible for a visit    Pt seen in conjunction with Dr. Hyacinth Meeker.  Reine Just. Beverely Pace, MD Emergency Medicine PGY-III [phone]    Oleh Genin, MD 06/12/13 [phone]

## 2013-06-11 NOTE — ED Notes (Addendum)
Pt reports waking at 0100 this am with ridges on the top Right side of his head, headache, sensitivity to light, and dizziness upon standing. No facial droop, arm drift, slurred speech noted. Grips and strengths equal bilaterally

## 2013-06-11 NOTE — ED Provider Notes (Signed)
57 year old male, presents with headache, lightheadedness, on exam has soft abdomen, clear heart and lung sounds, normal pupillary exam, clear oropharynx with moist membranes. Neurologically he is able to move all extremities x4 without any focal deficits, there is no facial droop, no slurred speech, no pronator drift. The patient has no focal exam findings, he has multiple visits to the emergency department for similar complaints and appears to be stable for discharge.  Vida Roller, MD 06/11/13 2015

## 2013-06-12 ENCOUNTER — Telehealth: Payer: Self-pay | Admitting: *Deleted

## 2013-06-12 ENCOUNTER — Ambulatory Visit: Payer: Medicare Other | Admitting: Internal Medicine

## 2013-06-12 NOTE — ED Provider Notes (Signed)
I saw and evaluated the patient, reviewed the resident's note and I agree with the findings and plan.  Please see my separate note regarding my evaluation of the patient.    Jared Whorley D Prisha Hiley, MD 06/12/13 0025 

## 2013-06-12 NOTE — Telephone Encounter (Signed)
Non working number.

## 2013-06-18 ENCOUNTER — Other Ambulatory Visit: Payer: Self-pay | Admitting: Internal Medicine

## 2013-06-18 DIAGNOSIS — G8929 Other chronic pain: Secondary | ICD-10-CM

## 2013-06-26 ENCOUNTER — Emergency Department (HOSPITAL_COMMUNITY)
Admission: EM | Admit: 2013-06-26 | Discharge: 2013-06-26 | Disposition: A | Discharge status: Home / Self Care | Payer: Medicare Other | Attending: Emergency Medicine | Admitting: Emergency Medicine

## 2013-06-26 ENCOUNTER — Encounter (HOSPITAL_COMMUNITY): Payer: Self-pay | Admitting: Emergency Medicine

## 2013-06-26 ENCOUNTER — Emergency Department (HOSPITAL_COMMUNITY): Payer: Medicare Other

## 2013-06-26 DIAGNOSIS — Z8701 Personal history of pneumonia (recurrent): Secondary | ICD-10-CM | POA: Insufficient documentation

## 2013-06-26 DIAGNOSIS — Z7982 Long term (current) use of aspirin: Secondary | ICD-10-CM | POA: Insufficient documentation

## 2013-06-26 DIAGNOSIS — I2510 Atherosclerotic heart disease of native coronary artery without angina pectoris: Secondary | ICD-10-CM | POA: Insufficient documentation

## 2013-06-26 DIAGNOSIS — G43909 Migraine, unspecified, not intractable, without status migrainosus: Secondary | ICD-10-CM | POA: Insufficient documentation

## 2013-06-26 DIAGNOSIS — Z21 Asymptomatic human immunodeficiency virus [HIV] infection status: Secondary | ICD-10-CM | POA: Insufficient documentation

## 2013-06-26 DIAGNOSIS — M545 Low back pain, unspecified: Secondary | ICD-10-CM | POA: Insufficient documentation

## 2013-06-26 DIAGNOSIS — G8929 Other chronic pain: Secondary | ICD-10-CM | POA: Insufficient documentation

## 2013-06-26 DIAGNOSIS — M129 Arthropathy, unspecified: Secondary | ICD-10-CM | POA: Insufficient documentation

## 2013-06-26 DIAGNOSIS — Z9889 Other specified postprocedural states: Secondary | ICD-10-CM | POA: Insufficient documentation

## 2013-06-26 DIAGNOSIS — Z79899 Other long term (current) drug therapy: Secondary | ICD-10-CM | POA: Insufficient documentation

## 2013-06-26 DIAGNOSIS — Z8709 Personal history of other diseases of the respiratory system: Secondary | ICD-10-CM | POA: Insufficient documentation

## 2013-06-26 DIAGNOSIS — I10 Essential (primary) hypertension: Secondary | ICD-10-CM | POA: Insufficient documentation

## 2013-06-26 DIAGNOSIS — R209 Unspecified disturbances of skin sensation: Secondary | ICD-10-CM | POA: Insufficient documentation

## 2013-06-26 DIAGNOSIS — R200 Anesthesia of skin: Secondary | ICD-10-CM

## 2013-06-26 DIAGNOSIS — Z87891 Personal history of nicotine dependence: Secondary | ICD-10-CM | POA: Insufficient documentation

## 2013-06-26 MED ORDER — ORPHENADRINE CITRATE 30 MG/ML IJ SOLN: 60.0000 mg | Freq: Two times a day (BID) | INTRAMUSCULAR | Status: DC

## 2013-06-26 MED ORDER — HYDROMORPHONE HCL PF 1 MG/ML IJ SOLN
1.0000 mg | Freq: Once | INTRAMUSCULAR | Status: AC
  Administered 2013-06-26: 1 mg via INTRAMUSCULAR
  Filled 2013-06-26: qty 1

## 2013-06-26 MED ORDER — METHOCARBAMOL 100 MG/ML IJ SOLN
1000.0000 mg | Freq: Once | INTRAMUSCULAR | Status: DC
  Administered 2013-06-26: 1000 mg via INTRAMUSCULAR
  Filled 2013-06-26: qty 10

## 2013-06-26 MED ORDER — METHOCARBAMOL 500 MG PO TABS: 500.0000 mg | ORAL_TABLET | Freq: Two times a day (BID) | ORAL | Status: DC | PRN

## 2013-06-26 NOTE — ED Notes (Signed)
Pharmacy called to report we do not carry norflex, ED resident made aware.

## 2013-06-26 NOTE — ED Notes (Signed)
Left hand numbness and back pain has equal pulse and good pulse and can wiggle fingers

## 2013-06-26 NOTE — ED Provider Notes (Signed)
CSN: [id number]     Arrival date & time 06/26/13  1040 History   First MD Initiated Contact with Patient 06/26/13 1102     Chief Complaint  Patient presents with  . Hand Pain   (Consider location/radiation/quality/duration/timing/severity/associated sxs/prior Treatment) Patient is a 57 y.o. male presenting with arm injury. The history is provided by the patient and medical records.  Arm Injury Location:  Arm Injury: no   Arm location:  L arm Pain details:    Quality:  Tingling and throbbing   Severity:  Moderate   Onset quality:  Gradual   Timing:  Constant   Progression:  Unchanged Chronicity:  New Handedness:  Right-handed Associated symptoms: back pain, numbness and tingling   Associated symptoms: no fatigue, no fever, no neck pain, no stiffness and no swelling     Past Medical History  Diagnosis Date  . HIV positive 2004  . Hypertension   . Meningitis   . Coronary artery disease   . Chest pain at rest 09/23/2012  . Chronic bronchitis     "q year last 5 yr or so" (09/24/2012)  . Exertional dyspnea   . Migraines   . Arthritis     "both shoulders" (09/24/2012)  . Chronic lower back pain   . Pneumonia   . Lumbar disc disease   . Iliac artery aneurysm     Right   Past Surgical History  Procedure Laterality Date  . Intussusception repair  10/2011  . Posterior lumbar fusion  1995  . Elbow surgery  ~ 1997    "removed some stones; right" (09/24/2012)  . Appendectomy  2013  . Back surgery  2006    4 BACK SURGERIES  . Abdominal surgery    . Spine surgery      Injury to back 1995   Family History  Problem Relation Age of Onset  . Heart disease Father   . Heart disease Brother   . Diabetes Brother    History  Substance Use Topics  . Smoking status: Former Smoker -- 0.10 packs/day for 45 years    Types: Cigarettes    Quit date: 09/24/2012  . Smokeless tobacco: Never Used     Comment: 09/24/2012 "been stopped now ~ 8 month".  3 per day now.  . Alcohol Use: No     Review of Systems  Constitutional: Negative for fever and fatigue.  Musculoskeletal: Positive for back pain. Negative for neck pain and stiffness.  All other systems reviewed and are negative.    Allergies  Bee venom; Flexeril; Hydrocodone; Nabumetone; [name]; Tramadol; Ace inhibitors; and Other  Home Medications   Current Outpatient Rx  Name  Route  Sig  Dispense  Refill  . acetaminophen (TYLENOL) 500 MG tablet   Oral   Take 1,000 mg by mouth 2 (two) times daily as needed for pain.         Marland Kitchen amLODipine (NORVASC) 5 MG tablet   Oral   Take 1 tablet (5 mg total) by mouth daily.   30 tablet   10   . aspirin 81 MG chewable tablet   Oral   Chew 81 mg by mouth daily.          . butalbital-acetaminophen-caffeine (FIORICET) 50-325-40 MG per tablet   Oral   Take 1-2 tablets by mouth every 8 (eight) hours as needed for headache.   10 tablet   0   . EPINEPHrine (EPI-PEN) 0.3 mg/0.3 mL SOAJ   Intramuscular   Inject 0.3 mg  into the muscle once as needed (Anaphylaxis).          . Ipratropium-Albuterol (COMBIVENT RESPIMAT) 20-100 MCG/ACT AERS respimat   Inhalation   Inhale 1 puff into the lungs 2 (two) times daily as needed for wheezing or shortness of breath.         . Multiple Vitamin (MULTIVITAMIN WITH MINERALS) TABS   Oral   Take 1 tablet by mouth daily.         . nitroGLYCERIN (NITROSTAT) 0.4 MG SL tablet   Sublingual   Place 0.4 mg under the tongue every 5 (five) minutes as needed for chest pain.         . methocarbamol (ROBAXIN) 500 MG tablet   Oral   Take 1 tablet (500 mg total) by mouth 2 (two) times daily as needed.   6 tablet   0    BP 149/113  Pulse 64  Temp(Src) 97.8 F (36.6 C) (Oral)  Resp 16  SpO2 96% Physical Exam  Nursing note and vitals reviewed. Constitutional: He is oriented to person, place, and time. He appears well-developed and well-nourished. No distress.  HENT:  Head: Normocephalic and atraumatic.  Mouth/Throat:  Oropharynx is clear and moist. No oropharyngeal exudate.  Eyes: Conjunctivae and EOM are normal. Pupils are equal, round, and reactive to light.  Neck: Normal range of motion. Neck supple.  Cardiovascular: Normal rate, regular rhythm, normal heart sounds and intact distal pulses.  Exam reveals no gallop and no friction rub.   No murmur heard. Pulmonary/Chest: Effort normal and breath sounds normal. No respiratory distress. He has no wheezes. He has no rales.  Abdominal: Soft. He exhibits no distension and no mass. There is no tenderness. There is no rebound and no guarding.  Musculoskeletal: Normal range of motion. He exhibits no edema and no tenderness.  Lymphadenopathy:    He has no cervical adenopathy.  Neurological: He is alert and oriented to person, place, and time. He has normal strength. No cranial nerve deficit or sensory deficit. He displays a negative Romberg sign. Coordination and gait normal. GCS eye subscore is 4. GCS verbal subscore is 5. GCS motor subscore is 6.  Skin: Skin is warm and dry. No rash noted. He is not diaphoretic.  Psychiatric: He has a normal mood and affect. His behavior is normal. Judgment and thought content normal.    ED Course  Procedures (including critical care time) Labs Review Labs Reviewed - No data to display Imaging Review Dg Shoulder Left  06/26/2013   CLINICAL DATA:  No injury. Sudden left scapula pain with numbness in the left hand and fingers.  EXAM: LEFT SHOULDER - 2+ VIEW  COMPARISON:  03/29/2013.  FINDINGS: No fracture or dislocation.  Mild curvature of the acromion  Visualized lungs clear  IMPRESSION: No acute abnormality.   Electronically Signed   By: Bridgett Larsson M.D.   On: 06/26/2013 12:16    EKG Interpretation   None       Date: 06/26/2013  Rate: 79  Rhythm: normal sinus rhythm  QRS Axis: normal  Intervals: normal  ST/T Wave abnormalities: normal  Conduction Disutrbances:left anterior fascicular block  Narrative  Interpretation:   Old EKG Reviewed: unchanged   MDM   1. Numbness and tingling in left arm     This is a 57 year old male with a history of HIV, hypertension, coronary artery disease, chronic back pain who presents with left arm numbness, tingling, pain. Started earlier this morning approximately 3 hours prior to arrival  and has not resolved. Has taken NSAIDs at home for this. States she has not had similar episodes in the past, however chart review shows some murmurs department visits. No headaches, vision changes, fevers, neck pain or trauma, chest pain, shortness of breath. Does state lower back pain which is not new. Has not followed up with chronic pain, however he has an appointment at the end of this month.  On arrival patient is afebrile and hemodynamically stable. Well-appearing male in moderate pain. Nonfocal neurologic exam with subjective numbness over the lateral distribution of his left upper extremity from his lateral 3 fingers up to his shoulder. Full range of motion of the shoulder with some tenderness to the posterior aspect. No signs of dislocation or a.c. joint separation. Will evaluate with plain film of the left shoulder, screening EKG. We'll also give him IM muscle relaxer and narcotic.  Plain film of the left shoulder without abnormality. No signs of trauma, impingement, CVA, cervical spine injury, radiculopathy on exam. Screening EKG without signs of ischemia and unchanged from previous. Pain and spasming improved with treatment. No narcotics at discharge due to history of chronic pain, however will send home with limited amount of muscle relaxers. Patient to followup with PCP as well as chronic pain clinic.  Discussed with the patient return precautions and need for follow up with PCP and Chronic Pain. Patient voiced understanding. Stable for d/c. This patient was discussed with my attending, Dr. Lynelle Doctor.   Dorna Leitz, MD 06/26/13 [phone]

## 2013-06-26 NOTE — ED Notes (Signed)
ED resident Durward Fortes made aware that pt had already been given the robaxin before the order was placed to dc robaxin.

## 2013-06-26 NOTE — ED Provider Notes (Signed)
I saw and evaluated the patient, reviewed the resident's note and I agree with the findings and plan. I reviewed and interpreted the EKG during the patient's evaluation in the ED and agree with the resident's interpretation. Sinus rhythm, rate 79 Left anterior fascicular block Abnormal R-wave progression, early transition Probable left ventricular hypertrophy  Pt with ttp right shoulder.  Pain reproduced with movement. Nl radial pulse.  Strong grip.  Nl sensation.   Multiple visits for similar symptoms in the past. Musculoskeletal pain.  At this time there does not appear to be any evidence of an acute emergency medical condition and the patient appears stable for discharge with appropriate outpatient follow up.     Celene Kras, MD 06/26/13 1248

## 2013-06-26 NOTE — ED Notes (Signed)
Patient transported to X-ray 

## 2013-06-30 ENCOUNTER — Emergency Department (HOSPITAL_COMMUNITY)
Admission: EM | Admit: 2013-06-30 | Discharge: 2013-06-30 | Disposition: A | Discharge status: Home / Self Care | Payer: Medicare Other | Attending: Emergency Medicine | Admitting: Emergency Medicine

## 2013-06-30 ENCOUNTER — Encounter (HOSPITAL_COMMUNITY): Payer: Self-pay | Admitting: Emergency Medicine

## 2013-06-30 ENCOUNTER — Emergency Department (HOSPITAL_COMMUNITY): Payer: Medicare Other

## 2013-06-30 DIAGNOSIS — R209 Unspecified disturbances of skin sensation: Secondary | ICD-10-CM | POA: Insufficient documentation

## 2013-06-30 DIAGNOSIS — Z79899 Other long term (current) drug therapy: Secondary | ICD-10-CM | POA: Insufficient documentation

## 2013-06-30 DIAGNOSIS — Z21 Asymptomatic human immunodeficiency virus [HIV] infection status: Secondary | ICD-10-CM | POA: Insufficient documentation

## 2013-06-30 DIAGNOSIS — Z8701 Personal history of pneumonia (recurrent): Secondary | ICD-10-CM | POA: Insufficient documentation

## 2013-06-30 DIAGNOSIS — M545 Low back pain, unspecified: Secondary | ICD-10-CM | POA: Insufficient documentation

## 2013-06-30 DIAGNOSIS — Z8709 Personal history of other diseases of the respiratory system: Secondary | ICD-10-CM | POA: Insufficient documentation

## 2013-06-30 DIAGNOSIS — I2510 Atherosclerotic heart disease of native coronary artery without angina pectoris: Secondary | ICD-10-CM | POA: Insufficient documentation

## 2013-06-30 DIAGNOSIS — G8929 Other chronic pain: Secondary | ICD-10-CM

## 2013-06-30 DIAGNOSIS — Z9889 Other specified postprocedural states: Secondary | ICD-10-CM | POA: Insufficient documentation

## 2013-06-30 DIAGNOSIS — M129 Arthropathy, unspecified: Secondary | ICD-10-CM | POA: Insufficient documentation

## 2013-06-30 DIAGNOSIS — I10 Essential (primary) hypertension: Secondary | ICD-10-CM | POA: Insufficient documentation

## 2013-06-30 DIAGNOSIS — Z7982 Long term (current) use of aspirin: Secondary | ICD-10-CM | POA: Insufficient documentation

## 2013-06-30 DIAGNOSIS — Z87891 Personal history of nicotine dependence: Secondary | ICD-10-CM | POA: Insufficient documentation

## 2013-06-30 MED ORDER — OXYCODONE-ACETAMINOPHEN 5-325 MG PO TABS: 1.0000 | ORAL_TABLET | Freq: Four times a day (QID) | ORAL | Status: DC | PRN

## 2013-06-30 MED ORDER — METHOCARBAMOL 500 MG PO TABS: 500.0000 mg | ORAL_TABLET | Freq: Two times a day (BID) | ORAL | Status: DC | PRN

## 2013-06-30 MED ORDER — DIAZEPAM 5 MG PO TABS
5.0000 mg | ORAL_TABLET | Freq: Once | ORAL | Status: AC
  Administered 2013-06-30: 5 mg via ORAL
  Filled 2013-06-30: qty 1

## 2013-06-30 MED ORDER — HYDROMORPHONE HCL PF 1 MG/ML IJ SOLN
1.0000 mg | Freq: Once | INTRAMUSCULAR | Status: AC
  Administered 2013-06-30: 1 mg via INTRAMUSCULAR
  Filled 2013-06-30: qty 1

## 2013-06-30 NOTE — ED Notes (Signed)
Returned from xray.  No change in pain.. 

## 2013-06-30 NOTE — Progress Notes (Signed)
ED CM in room to meet with patient wife at bedside. Pt presented to [facility] ED with chronic back pain. Pt has had 16 ED visits within the last 6 months.  The majority of the ED visits have been related to the same complaint. Pt reports having a PCP Dr. Johna Roles  who  has been following him. Pt reports he  has an appointment with pain clinic on Oct 28th to follow up with chronic pain. Pt.Encouraged to make every attempt to make appointment. Pt verbalized understanding and agrees. No further CM needs identified.

## 2013-06-30 NOTE — ED Provider Notes (Signed)
Medical screening examination/treatment/procedure(s) were performed by non-physician practitioner and as supervising physician I was immediately available for consultation/collaboration.   Layla Maw Alura Olveda, DO 06/30/13 2342

## 2013-06-30 NOTE — ED Notes (Signed)
Pt has history of back problems and states it is getting worse everyday.  Pt walked out of house to car and lower back snapped making it hard for him to walk.  Pt has numbness in feet.

## 2013-06-30 NOTE — ED Notes (Signed)
Patient transported to X-ray 

## 2013-06-30 NOTE — Progress Notes (Signed)
Orthopedic Tech Progress Note Patient Details:  Kenneth [name] [birth year] [id number]  Ortho Devices Type of Ortho Device: Crutches Ortho Device/Splint Interventions: Ordered;Adjustment   Jennye Moccasin 06/30/2013, 11:06 PM

## 2013-06-30 NOTE — ED Provider Notes (Signed)
CSN: [id number]     Arrival date & time 06/30/13  1708 History   First MD Initiated Contact with Patient 06/30/13 2026     Chief Complaint  Patient presents with  . Back Pain   (Consider location/radiation/quality/duration/timing/severity/associated sxs/prior Treatment) HPI Comments: Patient is a 57 year old male with a history of HIV, hypertension, coronary artery disease, and chronic back pain who presents for worsening back pain with onset today. Patient states that he was outside in the yard playing with the kids when he picked up a ball and through it. Patient experienced a sudden onset of sharp pain in his left back at this time. Pain has been constant since worsening and radiates to his left shoulder as well as down his left leg. Patient states that pain is worse with movement as well as ambulation and is without any alleviating factors. Patient states he "only took Tylenol" without relief. Patient endorses associated subjective numbness in his left foot. He denies associated fever, nausea or vomiting, saddle anesthesia, perianal numbness, bowel or bladder incontinence, and lower extremity weakness. Patient endorses a history of posterior lumbar fusion. He denies being followed by an orthopedist at this time and states he has an appointment with pain management on October 28th. Last CD4 count - 740. No hx CA or IVDU.  The history is provided by the patient. No language interpreter was used.    Past Medical History  Diagnosis Date  . HIV positive 2004  . Hypertension   . Meningitis   . Coronary artery disease   . Chest pain at rest 09/23/2012  . Chronic bronchitis     "q year last 5 yr or so" (09/24/2012)  . Exertional dyspnea   . Migraines   . Arthritis     "both shoulders" (09/24/2012)  . Chronic lower back pain   . Pneumonia   . Lumbar disc disease   . Iliac artery aneurysm     Right   Past Surgical History  Procedure Laterality Date  . Intussusception repair  10/2011  .  Posterior lumbar fusion  1995  . Elbow surgery  ~ 1997    "removed some stones; right" (09/24/2012)  . Appendectomy  2013  . Back surgery  2006    4 BACK SURGERIES  . Abdominal surgery    . Spine surgery      Injury to back 1995   Family History  Problem Relation Age of Onset  . Heart disease Father   . Heart disease Brother   . Diabetes Brother    History  Substance Use Topics  . Smoking status: Former Smoker -- 0.10 packs/day for 45 years    Types: Cigarettes    Quit date: 09/24/2012  . Smokeless tobacco: Never Used     Comment: 09/24/2012 "been stopped now ~ 8 month".  3 per day now.  . Alcohol Use: No    Review of Systems  Constitutional: Negative for fever.  Genitourinary:       Negative for incontinence  Musculoskeletal: Positive for arthralgias, back pain and myalgias.  Neurological: Positive for numbness (subjective). Negative for weakness.  All other systems reviewed and are negative.    Allergies  Bee venom; Flexeril; Hydrocodone; Nabumetone; [name]; Tramadol; Ace inhibitors; and Other  Home Medications   Current Outpatient Rx  Name  Route  Sig  Dispense  Refill  . acetaminophen (TYLENOL) 500 MG tablet   Oral   Take 1,000 mg by mouth 2 (two) times daily as needed for pain.         Marland Kitchen  amLODipine (NORVASC) 5 MG tablet   Oral   Take 1 tablet (5 mg total) by mouth daily.   30 tablet   10   . aspirin 81 MG chewable tablet   Oral   Chew 81 mg by mouth daily.          . butalbital-acetaminophen-caffeine (FIORICET) 50-325-40 MG per tablet   Oral   Take 1-2 tablets by mouth every 8 (eight) hours as needed for headache.   10 tablet   0   . Multiple Vitamin (MULTIVITAMIN WITH MINERALS) TABS   Oral   Take 1 tablet by mouth daily.         Marland Kitchen EPINEPHrine (EPI-PEN) 0.3 mg/0.3 mL SOAJ   Intramuscular   Inject 0.3 mg into the muscle once as needed (Anaphylaxis).          . Ipratropium-Albuterol (COMBIVENT RESPIMAT) 20-100 MCG/ACT AERS respimat    Inhalation   Inhale 1 puff into the lungs 2 (two) times daily as needed for wheezing or shortness of breath.         . methocarbamol (ROBAXIN) 500 MG tablet   Oral   Take 1 tablet (500 mg total) by mouth 2 (two) times daily as needed.   9 tablet   0   . nitroGLYCERIN (NITROSTAT) 0.4 MG SL tablet   Sublingual   Place 0.4 mg under the tongue every 5 (five) minutes as needed for chest pain.         Marland Kitchen oxyCODONE-acetaminophen (PERCOCET/ROXICET) 5-325 MG per tablet   Oral   Take 1 tablet by mouth every 6 (six) hours as needed for pain.   7 tablet   0    BP 160/110  Pulse 69  Temp(Src) 97.8 F (36.6 C) (Oral)  Resp 21  SpO2 98%  Physical Exam  Nursing note and vitals reviewed. Constitutional: He is oriented to person, place, and time. He appears well-developed and well-nourished. No distress.  HENT:  Head: Normocephalic and atraumatic.  Eyes: Conjunctivae and EOM are normal. No scleral icterus.  Neck: Normal range of motion.  Cardiovascular: Normal rate, regular rhythm and intact distal pulses.   DP and PT pulses 2+ bilaterally  Pulmonary/Chest: Effort normal. No respiratory distress.  Musculoskeletal:       Left hip: Normal.       Thoracic back: He exhibits tenderness. He exhibits no bony tenderness, no swelling, no deformity, no laceration, no pain and no spasm.       Lumbar back: He exhibits decreased range of motion (secondary to discomfort), tenderness, bony tenderness and pain. He exhibits no swelling, no edema, no deformity, no spasm and normal pulse.       Back:  Well-healed scar to the lumbar midline. Tenderness to palpation of the lumbar midline without bony deformities or step-offs. Also tenderness to the left lumbosacral paraspinal muscles without obvious spasm. + straight leg raise and negative crossed straight leg raise.  Neurological: He is alert and oriented to person, place, and time.  GCS 15. Patient was extremities without ataxia. No sensory or motor  deficits appreciated. Patient slow to move, but ambulatory without assistance. DTRs normal and symmetric.  Skin: Skin is warm and dry. No rash noted. He is not diaphoretic. No erythema. No pallor.  Psychiatric: He has a normal mood and affect. His behavior is normal.    ED Course  Procedures (including critical care time) Labs Review Labs Reviewed - No data to display  Imaging Review Dg Thoracic Spine 2 View  06/30/2013  CLINICAL DATA:  Back pain after bending down.  EXAM: THORACIC SPINE - 2 VIEW  COMPARISON:  03/29/2013.  FINDINGS: There is no evidence of thoracic spine fracture. Alignment is normal. No other significant bone abnormalities are identified. Chronic changes at the left lung base.  IMPRESSION: Negative.   Electronically Signed   By: Davonna Belling M.D.   On: 06/30/2013 21:54   Dg Lumbar Spine Complete  06/30/2013   CLINICAL DATA:  Back pain after bending down.  EXAM: LUMBAR SPINE - COMPLETE 4+ VIEW  COMPARISON:  CT lumbar 12/22/2012  FINDINGS: There is no evidence of lumbar spine fracture. Alignment is normal. Intervertebral disc spaces are maintained except for moderate narrowing at L4-5 and L5-S1. Moderate vascular calcification. Similar appearance to priors.  IMPRESSION: No acute findings.   Electronically Signed   By: Davonna Belling M.D.   On: 06/30/2013 21:55    EKG Interpretation   None       MDM   1. Chronic back pain    57 year old male with a history of chronic back pain presents for acute on chronic back pain with onset today. Patient neurovascularly intact and slow to move, but ambulatory with normal gait. No gross sensory or motor deficits appreciated. Patient denies direct trauma or injury to the back today. No red flags or signs concerning for cauda equina. No history of cancer or IV drug use. X-rays of the thoracic and lumbar spine stable as compared to 6 months ago. Believe the patient is stable for discharge with orthopedic followup for further evaluation of  symptoms. Pain improved, per patient, with IM Dilaudid and Valium. Will prescribe Robaxin and short course of Percocet for symptomatic management. Patient also requesting crutches for weightbearing as tolerated which I have ordered. Return precautions discussed with the patient who verbalizes comfort and understanding with this discharge plan.    Antony Madura, PA-C 06/30/13 2219

## 2013-07-04 ENCOUNTER — Encounter: Payer: Self-pay | Admitting: *Deleted

## 2013-07-05 ENCOUNTER — Encounter (HOSPITAL_COMMUNITY): Payer: Self-pay | Admitting: Emergency Medicine

## 2013-07-05 ENCOUNTER — Emergency Department (HOSPITAL_COMMUNITY)
Admission: EM | Admit: 2013-07-05 | Discharge: 2013-07-05 | Disposition: A | Discharge status: Home / Self Care | Payer: Medicare Other | Attending: Emergency Medicine | Admitting: Emergency Medicine

## 2013-07-05 DIAGNOSIS — M129 Arthropathy, unspecified: Secondary | ICD-10-CM | POA: Insufficient documentation

## 2013-07-05 DIAGNOSIS — Z79899 Other long term (current) drug therapy: Secondary | ICD-10-CM | POA: Insufficient documentation

## 2013-07-05 DIAGNOSIS — Z9889 Other specified postprocedural states: Secondary | ICD-10-CM | POA: Insufficient documentation

## 2013-07-05 DIAGNOSIS — R269 Unspecified abnormalities of gait and mobility: Secondary | ICD-10-CM | POA: Insufficient documentation

## 2013-07-05 DIAGNOSIS — Z8701 Personal history of pneumonia (recurrent): Secondary | ICD-10-CM | POA: Insufficient documentation

## 2013-07-05 DIAGNOSIS — Z87828 Personal history of other (healed) physical injury and trauma: Secondary | ICD-10-CM | POA: Insufficient documentation

## 2013-07-05 DIAGNOSIS — M549 Dorsalgia, unspecified: Secondary | ICD-10-CM | POA: Insufficient documentation

## 2013-07-05 DIAGNOSIS — I10 Essential (primary) hypertension: Secondary | ICD-10-CM | POA: Insufficient documentation

## 2013-07-05 DIAGNOSIS — Z7982 Long term (current) use of aspirin: Secondary | ICD-10-CM | POA: Insufficient documentation

## 2013-07-05 DIAGNOSIS — Z21 Asymptomatic human immunodeficiency virus [HIV] infection status: Secondary | ICD-10-CM | POA: Insufficient documentation

## 2013-07-05 DIAGNOSIS — Z8709 Personal history of other diseases of the respiratory system: Secondary | ICD-10-CM | POA: Insufficient documentation

## 2013-07-05 DIAGNOSIS — I2510 Atherosclerotic heart disease of native coronary artery without angina pectoris: Secondary | ICD-10-CM | POA: Insufficient documentation

## 2013-07-05 DIAGNOSIS — G8929 Other chronic pain: Secondary | ICD-10-CM

## 2013-07-05 DIAGNOSIS — Z87891 Personal history of nicotine dependence: Secondary | ICD-10-CM | POA: Insufficient documentation

## 2013-07-05 MED ORDER — OXYCODONE-ACETAMINOPHEN 5-325 MG PO TABS: 1.0000 | ORAL_TABLET | ORAL | Status: DC | PRN

## 2013-07-05 MED ORDER — HYDROMORPHONE HCL PF 2 MG/ML IJ SOLN
2.0000 mg | Freq: Once | INTRAMUSCULAR | Status: AC
  Administered 2013-07-05: 2 mg via INTRAMUSCULAR
  Filled 2013-07-05: qty 1

## 2013-07-05 MED ORDER — METHOCARBAMOL 500 MG PO TABS: 500.0000 mg | ORAL_TABLET | Freq: Two times a day (BID) | ORAL | Status: DC

## 2013-07-05 NOTE — ED Notes (Signed)
Pt complaint of bilateral hip pain that radiates down both legs to both heels.  Denies trauma. Activity when pain began laying on couch.

## 2013-07-05 NOTE — ED Provider Notes (Signed)
  Medical screening examination/treatment/procedure(s) were performed by non-physician practitioner and as supervising physician I was immediately available for consultation/collaboration.   Skylor Schnapp, MD 07/05/13 2336 

## 2013-07-05 NOTE — ED Provider Notes (Signed)
CSN: [id number]     Arrival date & time 07/05/13  1937 History  This chart was scribed for non-physician practitioner Dierdre Forth , PA-C working with Gerhard Munch, MD by Clydene Laming, ED Scribe. This patient was seen in room TR05C/TR05C and the patient's care was started at 8:18 PM.   Chief Complaint  Patient presents with  . Leg Pain    The history is provided by the patient and medical records. No language interpreter was used.   HPI Comments: Kenneth [name] is a 57 y.o. male who presents to the Emergency Department complaining of gradual onset, persistent gradually worsening bilateral hip pain that radiates down both legs to both heels. 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 He reports he's had 4 back surgeries which eventually restored his ability to walk. Pt denies new trauma today including falls.  Pt is ambulatory with crutches in significant pain. Pt reports his last spine surgery with a fusion and he is due for another but does not want it. He reports pain is worsening over time and there has been no acute worsening. Pt reports temporary relief with oxycodone. Pain is worsened with pressure and walking. Patient denies saddle anesthesia, loss of bowel or bladder control.    Past Medical History  Diagnosis Date  . HIV positive 2004  . Hypertension   . Meningitis   . Coronary artery disease   . Chest pain at rest 09/23/2012  . Chronic bronchitis     "q year last 5 yr or so" (09/24/2012)  . Exertional dyspnea   . Migraines   . Arthritis     "both shoulders" (09/24/2012)  . Chronic lower back pain   . Pneumonia   . Lumbar disc disease   . Iliac artery aneurysm     Right   Past Surgical History  Procedure Laterality Date  . Intussusception repair  10/2011  . Posterior lumbar fusion  1995  . Elbow surgery  ~ 1997    "removed some stones; right" (09/24/2012)  . Appendectomy  2013  . Back surgery  2006    4 BACK  SURGERIES  . Abdominal surgery    . Spine surgery      Injury to back 1995   Family History  Problem Relation Age of Onset  . Heart disease Father   . Heart disease Brother   . Diabetes Brother    History  Substance Use Topics  . Smoking status: Former Smoker -- 0.10 packs/day for 45 years    Types: Cigarettes    Quit date: 09/24/2012  . Smokeless tobacco: Never Used     Comment: 09/24/2012 "been stopped now ~ 8 month".  3 per day now.  . Alcohol Use: No    Review of Systems  Constitutional: Negative for fever and fatigue.  Respiratory: Negative for chest tightness and shortness of breath.   Cardiovascular: Negative for chest pain.  Gastrointestinal: Negative for nausea, vomiting, abdominal pain and diarrhea.  Genitourinary: Negative for dysuria, urgency, frequency and hematuria.  Musculoskeletal: Positive for back pain, gait problem ( 2/2 pain) and myalgias. Negative for joint swelling, neck pain and neck stiffness.  Skin: Negative for rash.  Neurological: Negative for weakness, light-headedness, numbness and headaches.  All other systems reviewed and are negative.    Allergies  Bee venom; Flexeril; Hydrocodone; Nabumetone; [name]; Tramadol; Ace inhibitors; and Other  Home Medications   Current Outpatient Rx  Name  Route  Sig  Dispense  Refill  . acetaminophen (TYLENOL) 500 MG tablet   Oral   Take 1,000 mg by mouth 2 (two) times daily as needed for pain.         Marland Kitchen amLODipine (NORVASC) 5 MG tablet   Oral   Take 1 tablet (5 mg total) by mouth daily.   30 tablet   10   . aspirin 81 MG chewable tablet   Oral   Chew 81 mg by mouth daily.          . butalbital-acetaminophen-caffeine (FIORICET) 50-325-40 MG per tablet   Oral   Take 1-2 tablets by mouth every 8 (eight) hours as needed for headache.   10 tablet   0   . EPINEPHrine (EPI-PEN) 0.3 mg/0.3 mL SOAJ   Intramuscular   Inject 0.3 mg into the muscle once as needed (Anaphylaxis).          .  Ipratropium-Albuterol (COMBIVENT RESPIMAT) 20-100 MCG/ACT AERS respimat   Inhalation   Inhale 1 puff into the lungs 2 (two) times daily as needed for wheezing or shortness of breath.         . methocarbamol (ROBAXIN) 500 MG tablet   Oral   Take 1 tablet (500 mg total) by mouth 2 (two) times daily as needed.   9 tablet   0   . Multiple Vitamin (MULTIVITAMIN WITH MINERALS) TABS   Oral   Take 1 tablet by mouth daily.         . nitroGLYCERIN (NITROSTAT) 0.4 MG SL tablet   Sublingual   Place 0.4 mg under the tongue every 5 (five) minutes as needed for chest pain.         Marland Kitchen oxyCODONE-acetaminophen (PERCOCET/ROXICET) 5-325 MG per tablet   Oral   Take 1 tablet by mouth every 6 (six) hours as needed for pain.   7 tablet   0   . methocarbamol (ROBAXIN) 500 MG tablet   Oral   Take 1 tablet (500 mg total) by mouth 2 (two) times daily.   20 tablet   0   . oxyCODONE-acetaminophen (PERCOCET/ROXICET) 5-325 MG per tablet   Oral   Take 1 tablet by mouth every 4 (four) hours as needed for pain.   15 tablet   0    Triage Vitals:BP 145/115  Pulse 107  Temp(Src) 98.2 F (36.8 C) (Oral)  Resp 18  Wt 160 lb (72.576 kg)  BMI 23.62 kg/m2  SpO2 98% Physical Exam  Nursing note and vitals reviewed. Constitutional: He is oriented to person, place, and time. He appears well-developed and well-nourished. No distress.  HENT:  Head: Normocephalic and atraumatic.  Mouth/Throat: Oropharynx is clear and moist. No oropharyngeal exudate.  Eyes: Conjunctivae are normal.  Neck: Normal range of motion. Neck supple.  Full ROM without pain  Cardiovascular: Normal rate, regular rhythm, normal heart sounds and intact distal pulses.   No murmur heard. Capillary refill less than 3 seconds  Pulmonary/Chest: Effort normal and breath sounds normal. No respiratory distress. He has no wheezes.  Abdominal: Soft. Bowel sounds are normal. He exhibits no distension. There is no tenderness.  Musculoskeletal:  He exhibits tenderness. He exhibits no edema.  ROM: decreased in the lumbar spine Tenderness to bilateral paraspinal muscles of the lumbar spine Palpable spinal fusion in the lumbar spine No midline tenderness  Lymphadenopathy:    He has no cervical adenopathy.  Neurological: He is alert and oriented to person, place, and time. He has normal reflexes.  Coordination normal.  Speech is clear and goal oriented, follows commands Normal strength in upper extremities bilaterally including strong and equal grip strength Mildly decreased strength in the lower extremities at 4/5 due to pain, 5/5 dorsiflexion and plantar flexion Sensation normal to light and sharp touch Moves extremities without ataxia, coordination intact  Skin: Skin is warm and dry. No rash noted. He is not diaphoretic. No erythema.  No tenting of the skin  Psychiatric: He has a normal mood and affect.    ED Course  Procedures (including critical care time) DIAGNOSTIC STUDIES: Oxygen Saturation is 98% on RA, normal by my interpretation.    COORDINATION OF CARE: 10:58 PM- Discussed treatment plan with pt at bedside. Pt verbalized understanding and agreement with plan.   Labs Review Labs Reviewed - No data to display Imaging Review No results found.  EKG Interpretation   None       MDM   1. Chronic back pain greater than 3 months duration    Edwing Figley presents with acute exacerbation of his chronic back pain.  Patient with long-standing history of back pain and no acute trauma today. No imaging indicated.  On initial exam patient having difficulty walking due to pain. We'll treat pain and reassess.  No neurological deficits and normal neuro exam.  Patient  Walks without difficulty using his crutches but states continued pain, though some improved.  No loss of bowel or bladder control.  No concern for cauda equina.  No fever, night sweats, weight loss, h/o cancer, IVDU.  RICE protocol and pain medicine indicated and  discussed with patient.   Record review shows that patient has been seen in the emergency department 14 times the last 6 months for similar complaints.  He reports that he has an upcoming appointment on October 28 with pain management.  I discussed at length our concerns about treating chronic pain.  I have agreed to treat patient's pain until he can see the pain clinic on October 28.  I told the patient that after that date emergency department will no longer provide him with narcotic pain medications.  It has been determined that no acute conditions requiring further emergency intervention are present at this time. The patient/guardian have been advised of the diagnosis and plan. We have discussed signs and symptoms that warrant return to the ED, such as changes or worsening in symptoms.   Vital signs obtained at triage. Patient was in significant pain. On reassessment patient no longer tachycardic.   BP 145/115  Pulse 107  Temp(Src) 98.2 F (36.8 C) (Oral)  Resp 18  Wt 160 lb (72.576 kg)  BMI 23.62 kg/m2  SpO2 98%  Patient/guardian has voiced understanding and agreed to follow-up with the PCP or specialist.   I personally performed the services described in this documentation, which was scribed in my presence. The recorded information has been reviewed and is accurate.     Dahlia Client Danyetta Gillham, PA-C 07/05/13 2313

## 2013-07-05 NOTE — ED Notes (Addendum)
Pt ambulated with assistance down hallway.

## 2013-07-05 NOTE — ED Notes (Signed)
Pt dc to home. Pt sts understanding to dc instructions. Pt taken to exit via w/c. nadn. 

## 2013-07-20 ENCOUNTER — Emergency Department (HOSPITAL_COMMUNITY): Payer: Medicare Other

## 2013-07-20 ENCOUNTER — Emergency Department (HOSPITAL_COMMUNITY)
Admission: EM | Admit: 2013-07-20 | Discharge: 2013-07-20 | Disposition: A | Discharge status: Home / Self Care | Payer: Medicare Other | Attending: Emergency Medicine | Admitting: Emergency Medicine

## 2013-07-20 ENCOUNTER — Encounter (HOSPITAL_COMMUNITY): Payer: Self-pay | Admitting: Emergency Medicine

## 2013-07-20 DIAGNOSIS — J189 Pneumonia, unspecified organism: Secondary | ICD-10-CM | POA: Insufficient documentation

## 2013-07-20 DIAGNOSIS — IMO0001 Reserved for inherently not codable concepts without codable children: Secondary | ICD-10-CM | POA: Insufficient documentation

## 2013-07-20 DIAGNOSIS — I2510 Atherosclerotic heart disease of native coronary artery without angina pectoris: Secondary | ICD-10-CM | POA: Insufficient documentation

## 2013-07-20 DIAGNOSIS — Z8701 Personal history of pneumonia (recurrent): Secondary | ICD-10-CM | POA: Insufficient documentation

## 2013-07-20 DIAGNOSIS — M545 Low back pain, unspecified: Secondary | ICD-10-CM | POA: Insufficient documentation

## 2013-07-20 DIAGNOSIS — Z8661 Personal history of infections of the central nervous system: Secondary | ICD-10-CM | POA: Insufficient documentation

## 2013-07-20 DIAGNOSIS — M519 Unspecified thoracic, thoracolumbar and lumbosacral intervertebral disc disorder: Secondary | ICD-10-CM | POA: Insufficient documentation

## 2013-07-20 DIAGNOSIS — Z7982 Long term (current) use of aspirin: Secondary | ICD-10-CM | POA: Insufficient documentation

## 2013-07-20 DIAGNOSIS — J029 Acute pharyngitis, unspecified: Secondary | ICD-10-CM | POA: Insufficient documentation

## 2013-07-20 DIAGNOSIS — Z888 Allergy status to other drugs, medicaments and biological substances status: Secondary | ICD-10-CM | POA: Insufficient documentation

## 2013-07-20 DIAGNOSIS — Z87891 Personal history of nicotine dependence: Secondary | ICD-10-CM | POA: Insufficient documentation

## 2013-07-20 DIAGNOSIS — M129 Arthropathy, unspecified: Secondary | ICD-10-CM | POA: Insufficient documentation

## 2013-07-20 DIAGNOSIS — Z8669 Personal history of other diseases of the nervous system and sense organs: Secondary | ICD-10-CM | POA: Insufficient documentation

## 2013-07-20 DIAGNOSIS — G8929 Other chronic pain: Secondary | ICD-10-CM | POA: Insufficient documentation

## 2013-07-20 DIAGNOSIS — I723 Aneurysm of iliac artery: Secondary | ICD-10-CM | POA: Insufficient documentation

## 2013-07-20 DIAGNOSIS — Z8679 Personal history of other diseases of the circulatory system: Secondary | ICD-10-CM | POA: Insufficient documentation

## 2013-07-20 DIAGNOSIS — Z885 Allergy status to narcotic agent status: Secondary | ICD-10-CM | POA: Insufficient documentation

## 2013-07-20 DIAGNOSIS — I10 Essential (primary) hypertension: Secondary | ICD-10-CM | POA: Insufficient documentation

## 2013-07-20 DIAGNOSIS — J42 Unspecified chronic bronchitis: Secondary | ICD-10-CM | POA: Insufficient documentation

## 2013-07-20 DIAGNOSIS — Z21 Asymptomatic human immunodeficiency virus [HIV] infection status: Secondary | ICD-10-CM | POA: Insufficient documentation

## 2013-07-20 DIAGNOSIS — Z79899 Other long term (current) drug therapy: Secondary | ICD-10-CM | POA: Insufficient documentation

## 2013-07-20 MED ORDER — OXYCODONE-ACETAMINOPHEN 5-325 MG PO TABS
2.0000 | ORAL_TABLET | Freq: Once | ORAL | Status: AC
  Administered 2013-07-20: 2 via ORAL
  Filled 2013-07-20: qty 2

## 2013-07-20 MED ORDER — AZITHROMYCIN 250 MG PO TABS: 250.0000 mg | ORAL_TABLET | Freq: Every day | ORAL | Status: DC

## 2013-07-20 NOTE — ED Notes (Signed)
Cough sob and aching all over since 2 am

## 2013-07-20 NOTE — ED Provider Notes (Signed)
CSN: [id number]     Arrival date & time 07/20/13  4098 History   First MD Initiated Contact with Patient 07/20/13 [phone]     Chief Complaint  Patient presents with  . Cough  . Shortness of Breath   (Consider location/radiation/quality/duration/timing/severity/associated sxs/prior Treatment) HPI Comments: Patient presents emergency department with chief complaint of cough and shortness of breath. He states that his wife was experiencing similar symptoms for the past several days. He is followed by Dr. Orvan Falconer from infectious disease for HIV. He states that he does not recall his last CD4 count or viral load, but states that he worked "good." He has been taking his antivirals regularly. He endorses a productive cough. He also states that he has been having generalized body aches. Also endorses sore throat. He denies fevers, chills, nausea, vomiting, diarrhea, or constipation.  The history is provided by the patient. No language interpreter was used.    Past Medical History  Diagnosis Date  . HIV positive 2004  . Hypertension   . Meningitis   . Coronary artery disease   . Chest pain at rest 09/23/2012  . Chronic bronchitis     "q year last 5 yr or so" (09/24/2012)  . Exertional dyspnea   . Migraines   . Arthritis     "both shoulders" (09/24/2012)  . Chronic lower back pain   . Pneumonia   . Lumbar disc disease   . Iliac artery aneurysm     Right   Past Surgical History  Procedure Laterality Date  . Intussusception repair  10/2011  . Posterior lumbar fusion  1995  . Elbow surgery  ~ 1997    "removed some stones; right" (09/24/2012)  . Appendectomy  2013  . Back surgery  2006    4 BACK SURGERIES  . Abdominal surgery    . Spine surgery      Injury to back 1995   Family History  Problem Relation Age of Onset  . Heart disease Father   . Heart disease Brother   . Diabetes Brother    History  Substance Use Topics  . Smoking status: Former Smoker -- 0.10 packs/day for 45 years     Types: Cigarettes    Quit date: 09/24/2012  . Smokeless tobacco: Never Used     Comment: 09/24/2012 "been stopped now ~ 8 month".  3 per day now.  . Alcohol Use: No    Review of Systems  All other systems reviewed and are negative.    Allergies  Bee venom; Flexeril; Hydrocodone; Nabumetone; [name]; Tramadol; Ace inhibitors; and Other  Home Medications   Current Outpatient Rx  Name  Route  Sig  Dispense  Refill  . acetaminophen (TYLENOL) 500 MG tablet   Oral   Take 1,000 mg by mouth 2 (two) times daily as needed for pain.         Marland Kitchen amLODipine (NORVASC) 5 MG tablet   Oral   Take 1 tablet (5 mg total) by mouth daily.   30 tablet   10   . aspirin 81 MG chewable tablet   Oral   Chew 81 mg by mouth daily.          . butalbital-acetaminophen-caffeine (FIORICET) 50-325-40 MG per tablet   Oral   Take 1-2 tablets by mouth every 8 (eight) hours as needed for headache.   10 tablet   0   . EPINEPHrine (EPI-PEN) 0.3 mg/0.3 mL SOAJ   Intramuscular   Inject 0.3 mg into the  muscle once as needed (Anaphylaxis).          . Ipratropium-Albuterol (COMBIVENT RESPIMAT) 20-100 MCG/ACT AERS respimat   Inhalation   Inhale 1 puff into the lungs 2 (two) times daily as needed for wheezing or shortness of breath.         . methocarbamol (ROBAXIN) 500 MG tablet   Oral   Take 1 tablet (500 mg total) by mouth 2 (two) times daily as needed.   9 tablet   0   . methocarbamol (ROBAXIN) 500 MG tablet   Oral   Take 1 tablet (500 mg total) by mouth 2 (two) times daily.   20 tablet   0   . Multiple Vitamin (MULTIVITAMIN WITH MINERALS) TABS   Oral   Take 1 tablet by mouth daily.         . nitroGLYCERIN (NITROSTAT) 0.4 MG SL tablet   Sublingual   Place 0.4 mg under the tongue every 5 (five) minutes as needed for chest pain.         Marland Kitchen oxyCODONE-acetaminophen (PERCOCET/ROXICET) 5-325 MG per tablet   Oral   Take 1 tablet by mouth every 6 (six) hours as needed for pain.   7  tablet   0   . oxyCODONE-acetaminophen (PERCOCET/ROXICET) 5-325 MG per tablet   Oral   Take 1 tablet by mouth every 4 (four) hours as needed for pain.   15 tablet   0    BP 162/112  Pulse 99  Temp(Src) 98.3 F (36.8 C) (Oral)  Resp 22  SpO2 100% Physical Exam  Nursing note and vitals reviewed. Constitutional: He is oriented to person, place, and time. He appears well-developed and well-nourished.  HENT:  Head: Normocephalic and atraumatic.  Right Ear: External ear normal.  Left Ear: External ear normal.  Nose: Nose normal.  Mouth/Throat: Oropharynx is clear and moist. No oropharyngeal exudate.  Oropharynx is mildly inflamed, no evidence of tonsillar or peritonsillar abscess, no exudates, airway is intact  Eyes: Conjunctivae and EOM are normal. Pupils are equal, round, and reactive to light. Right eye exhibits no discharge. Left eye exhibits no discharge. No scleral icterus.  Neck: Normal range of motion. Neck supple. No JVD present.  Cardiovascular: Normal rate, regular rhythm, normal heart sounds and intact distal pulses.  Exam reveals no gallop and no friction rub.   No murmur heard. Pulmonary/Chest: Effort normal and breath sounds normal. No respiratory distress. He has no wheezes. He has no rales. He exhibits no tenderness.  Clear to auscultation bilaterally  Abdominal: Soft. He exhibits no distension and no mass. There is no tenderness. There is no rebound and no guarding.  Musculoskeletal: Normal range of motion. He exhibits no edema and no tenderness.  Neurological: He is alert and oriented to person, place, and time.  Skin: Skin is warm and dry.  Psychiatric: He has a normal mood and affect. His behavior is normal. Judgment and thought content normal.    ED Course  Procedures (including critical care time) Results for orders placed during the hospital encounter of 06/11/13  CBC      Result Value Range   WBC 6.8  4.0 - 10.5 K/uL   RBC 5.33  4.22 - 5.81 MIL/uL    Hemoglobin 14.8  13.0 - 17.0 g/dL   HCT 54.0  98.1 - 19.1 %   MCV 82.0  78.0 - 100.0 fL   MCH 27.8  26.0 - 34.0 pg   MCHC 33.9  30.0 - 36.0 g/dL  RDW 15.6 (*) 11.5 - 15.5 %   Platelets 295  150 - 400 K/uL  BASIC METABOLIC PANEL      Result Value Range   Sodium 141  135 - 145 mEq/L   Potassium 3.8  3.5 - 5.1 mEq/L   Chloride 109  96 - 112 mEq/L   CO2 23  19 - 32 mEq/L   Glucose, Bld 124 (*) 70 - 99 mg/dL   BUN 15  6 - 23 mg/dL   Creatinine, Ser 1.61  0.50 - 1.35 mg/dL   Calcium 8.8  8.4 - 09.6 mg/dL   GFR calc non Af Amer >90  >90 mL/min   GFR calc Af Amer >90  >90 mL/min   Dg Chest 2 View  07/20/2013   CLINICAL DATA:  Cough. Chills.  Malaise.  EXAM: CHEST  2 VIEW  COMPARISON:  01/28/2013  FINDINGS: Pulmonary emphysema again demonstrated. Mild patchy opacity is seen in the left lung base on the frontal projection which was not seen on previous study, and this could be due to early bronchopneumonia. Right basilar scarring is stable. No evidence of pleural effusion. Heart size is normal. No definite mass or lymphadenopathy identified.  IMPRESSION: Severe pulmonary emphysema.  Mild patchy opacity in left lung base. Early pneumonia cannot be excluded; continued radiographic followup recommended.   Electronically Signed   By: Myles Rosenthal M.D.   On: 07/20/2013 11:05      EKG Interpretation   None       MDM   1. CAP (community acquired pneumonia)     Patient with cough and cold symptoms. His HIV-positive. Reports productive sputum. Will check chest x-ray. Will reevaluate.  CXR shows evidence of pneumonia.  Discussed the patient with infectious disease, who tells me that with a recent CD4 of about 700, we can treat like regular CAP.  Will give azithro.  Patient discussed with Dr. Denton Lank, who agrees with the plan.  Filed Vitals:   07/20/13 1200  BP: 135/95  Pulse: 77  Temp:   Resp:        Roxy Horseman, PA-C 07/20/13 1220

## 2013-07-20 NOTE — ED Notes (Signed)
Pt returns from radiology. 

## 2013-07-24 ENCOUNTER — Other Ambulatory Visit: Payer: Self-pay

## 2013-07-24 NOTE — ED Provider Notes (Signed)
Medical screening examination/treatment/procedure(s) were conducted as a shared visit with non-physician practitioner(s) and myself.  I personally evaluated the patient during the encounter.  EKG Interpretation   None       Pt c/o non prod cough. No fever. No cp. Pt breathing comfortably, no increased wob. Cxr.   Suzi Roots, MD 07/24/13 [phone]

## 2013-07-30 ENCOUNTER — Emergency Department (HOSPITAL_COMMUNITY): Payer: Medicare Other

## 2013-07-30 ENCOUNTER — Emergency Department (HOSPITAL_COMMUNITY)
Admission: EM | Admit: 2013-07-30 | Discharge: 2013-07-30 | Disposition: A | Discharge status: Home / Self Care | Payer: Medicare Other | Attending: Emergency Medicine | Admitting: Emergency Medicine

## 2013-07-30 ENCOUNTER — Encounter (HOSPITAL_COMMUNITY): Payer: Self-pay | Admitting: Emergency Medicine

## 2013-07-30 DIAGNOSIS — R059 Cough, unspecified: Secondary | ICD-10-CM

## 2013-07-30 DIAGNOSIS — Z8709 Personal history of other diseases of the respiratory system: Secondary | ICD-10-CM | POA: Insufficient documentation

## 2013-07-30 DIAGNOSIS — M545 Low back pain, unspecified: Secondary | ICD-10-CM | POA: Insufficient documentation

## 2013-07-30 DIAGNOSIS — J449 Chronic obstructive pulmonary disease, unspecified: Secondary | ICD-10-CM | POA: Insufficient documentation

## 2013-07-30 DIAGNOSIS — I2510 Atherosclerotic heart disease of native coronary artery without angina pectoris: Secondary | ICD-10-CM | POA: Insufficient documentation

## 2013-07-30 DIAGNOSIS — J3489 Other specified disorders of nose and nasal sinuses: Secondary | ICD-10-CM | POA: Insufficient documentation

## 2013-07-30 DIAGNOSIS — R05 Cough: Secondary | ICD-10-CM

## 2013-07-30 DIAGNOSIS — G8929 Other chronic pain: Secondary | ICD-10-CM | POA: Insufficient documentation

## 2013-07-30 DIAGNOSIS — B20 Human immunodeficiency virus [HIV] disease: Secondary | ICD-10-CM

## 2013-07-30 DIAGNOSIS — IMO0001 Reserved for inherently not codable concepts without codable children: Secondary | ICD-10-CM | POA: Insufficient documentation

## 2013-07-30 DIAGNOSIS — Z79899 Other long term (current) drug therapy: Secondary | ICD-10-CM | POA: Insufficient documentation

## 2013-07-30 DIAGNOSIS — Z87891 Personal history of nicotine dependence: Secondary | ICD-10-CM | POA: Insufficient documentation

## 2013-07-30 DIAGNOSIS — Z21 Asymptomatic human immunodeficiency virus [HIV] infection status: Secondary | ICD-10-CM | POA: Insufficient documentation

## 2013-07-30 DIAGNOSIS — R079 Chest pain, unspecified: Secondary | ICD-10-CM | POA: Insufficient documentation

## 2013-07-30 DIAGNOSIS — Z8739 Personal history of other diseases of the musculoskeletal system and connective tissue: Secondary | ICD-10-CM | POA: Insufficient documentation

## 2013-07-30 DIAGNOSIS — J4489 Other specified chronic obstructive pulmonary disease: Secondary | ICD-10-CM | POA: Insufficient documentation

## 2013-07-30 DIAGNOSIS — I10 Essential (primary) hypertension: Secondary | ICD-10-CM | POA: Insufficient documentation

## 2013-07-30 DIAGNOSIS — B349 Viral infection, unspecified: Secondary | ICD-10-CM

## 2013-07-30 DIAGNOSIS — Z7982 Long term (current) use of aspirin: Secondary | ICD-10-CM | POA: Insufficient documentation

## 2013-07-30 DIAGNOSIS — B9789 Other viral agents as the cause of diseases classified elsewhere: Secondary | ICD-10-CM | POA: Insufficient documentation

## 2013-07-30 MED ORDER — ACETAMINOPHEN-CODEINE #3 300-30 MG PO TABS: 1.0000 | ORAL_TABLET | Freq: Three times a day (TID) | ORAL | Status: DC | PRN

## 2013-07-30 MED ORDER — OSELTAMIVIR PHOSPHATE 75 MG PO CAPS: 75.0000 mg | ORAL_CAPSULE | Freq: Two times a day (BID) | ORAL | Status: DC

## 2013-07-30 MED ORDER — ACETAMINOPHEN-CODEINE #3 300-30 MG PO TABS
2.0000 | ORAL_TABLET | Freq: Once | ORAL | Status: AC
  Administered 2013-07-30: 2 via ORAL
  Filled 2013-07-30: qty 2

## 2013-07-30 MED ORDER — PSEUDOEPHEDRINE HCL 60 MG PO TABS: 60.0000 mg | ORAL_TABLET | Freq: Three times a day (TID) | ORAL | Status: DC | PRN

## 2013-07-30 NOTE — ED Provider Notes (Signed)
CSN: [id number]     Arrival date & time 07/30/13  1000 History   First MD Initiated Contact with Patient 07/30/13 1039     Chief Complaint  Patient presents with  . Cough   (Consider location/radiation/quality/duration/timing/severity/associated sxs/prior Treatment) HPI Comments: Patient is a 57 year old male with history of HIV, hypertension, coronary artery disease, COPD, recent pneumonia who presents today with 2 days of cough, chest pain, congestion. He reports that it has been gradually worsening over the past 2 days. His cough is productive and he is coughing up green phlegm. He took a Tylenol as well as a NyQuil cold medication with no relief. His chest pain is sharp and is worse with palpation. It is on the right side of his chest. He believes this is because he has been coughing so hard. He has generalized body aches. He denies a fever, chills, diaphoresis.  The history is provided by the patient. No language interpreter was used.    Past Medical History  Diagnosis Date  . HIV positive 2004  . Hypertension   . Meningitis   . Coronary artery disease   . Chest pain at rest 09/23/2012  . Chronic bronchitis     "q year last 5 yr or so" (09/24/2012)  . Exertional dyspnea   . Migraines   . Arthritis     "both shoulders" (09/24/2012)  . Chronic lower back pain   . Pneumonia   . Lumbar disc disease   . Iliac artery aneurysm     Right   Past Surgical History  Procedure Laterality Date  . Intussusception repair  10/2011  . Posterior lumbar fusion  1995  . Elbow surgery  ~ 1997    "removed some stones; right" (09/24/2012)  . Appendectomy  2013  . Back surgery  2006    4 BACK SURGERIES  . Abdominal surgery    . Spine surgery      Injury to back 1995   Family History  Problem Relation Age of Onset  . Heart disease Father   . Heart disease Brother   . Diabetes Brother    History  Substance Use Topics  . Smoking status: Former Smoker -- 0.10 packs/day for 45 years    Types:  Cigarettes    Quit date: 09/24/2012  . Smokeless tobacco: Never Used     Comment: 09/24/2012 "been stopped now ~ 8 month".  3 per day now.  . Alcohol Use: No    Review of Systems  Constitutional: Negative for fever, chills and diaphoresis.  HENT: Positive for congestion and rhinorrhea.   Respiratory: Positive for cough.   Cardiovascular: Positive for chest pain.  Gastrointestinal: Negative for nausea, vomiting and abdominal pain.  Musculoskeletal: Positive for myalgias.  All other systems reviewed and are negative.    Allergies  Bee venom; Flexeril; Hydrocodone; Nabumetone; [name]; Tramadol; Ace inhibitors; and Other  Home Medications   Current Outpatient Rx  Name  Route  Sig  Dispense  Refill  . acetaminophen (TYLENOL) 500 MG tablet   Oral   Take 1,000 mg by mouth 2 (two) times daily as needed for pain.         Marland Kitchen amLODipine (NORVASC) 5 MG tablet   Oral   Take 1 tablet (5 mg total) by mouth daily.   30 tablet   10   . aspirin 81 MG chewable tablet   Oral   Chew 81 mg by mouth daily.          . Emtricitab-Rilpivir-Tenofovir (  COMPLERA) 200-25-300 MG TABS   Oral   Take 1 tablet by mouth daily.         Marland Kitchen EPINEPHrine (EPI-PEN) 0.3 mg/0.3 mL SOAJ   Intramuscular   Inject 0.3 mg into the muscle once as needed (Anaphylaxis).          . Ipratropium-Albuterol (COMBIVENT RESPIMAT) 20-100 MCG/ACT AERS respimat   Inhalation   Inhale 1 puff into the lungs 2 (two) times daily as needed for wheezing or shortness of breath.         . Multiple Vitamin (MULTIVITAMIN WITH MINERALS) TABS   Oral   Take 1 tablet by mouth daily.         . nitroGLYCERIN (NITROSTAT) 0.4 MG SL tablet   Sublingual   Place 0.4 mg under the tongue every 5 (five) minutes as needed for chest pain.          BP 147/99  Pulse 88  Temp(Src) 98.1 F (36.7 C) (Oral)  Resp 20  SpO2 97% Physical Exam  Nursing note and vitals reviewed. Constitutional: He is oriented to person, place, and  time. He appears well-developed and well-nourished. No distress.  HENT:  Head: Normocephalic and atraumatic.  Right Ear: Tympanic membrane, external ear and ear canal normal.  Left Ear: Tympanic membrane, external ear and ear canal normal.  Nose: Rhinorrhea present.  Irritated nasal mucosa   Eyes: Conjunctivae are normal.  Neck: Trachea normal, normal range of motion and phonation normal. No tracheal deviation present.  No nuchal rigidity or meningeal signs  Cardiovascular: Normal rate, regular rhythm, normal heart sounds, intact distal pulses and normal pulses.   Pulmonary/Chest: Effort normal and breath sounds normal. No stridor. He has no decreased breath sounds. He has no rales. He exhibits tenderness.    Abdominal: Soft. He exhibits no distension. There is no tenderness.  Musculoskeletal: Normal range of motion.  Neurological: He is alert and oriented to person, place, and time.  Skin: Skin is warm and dry. He is not diaphoretic.  Psychiatric: He has a normal mood and affect. His behavior is normal.    ED Course  Procedures (including critical care time) Labs Review Labs Reviewed - No data to display Imaging Review Dg Chest 2 View  07/30/2013   CLINICAL DATA:  57 year old male with cough and chest pain. Initial encounter.  EXAM: CHEST  2 VIEW  COMPARISON:  07/20/2013 and earlier.  FINDINGS: Chronic large lung volumes. Interval improved streaky left lung base opacity. No areas of worsening ventilation or opacity. No pneumothorax or edema. No pleural effusion. Normal cardiac size and mediastinal contours. Visualized tracheal air column is within normal limits. No acute osseous abnormality identified.  IMPRESSION: Interval improved ventilation at the left lung base. Emphysema. No acute cardiopulmonary abnormality.   Electronically Signed   By: Augusto Gamble M.D.   On: 07/30/2013 10:48    EKG Interpretation   None       MDM   1. Viral syndrome   2. HIV (human immunodeficiency  virus infection)   3. Cough    Patient presents with cough x 2 days as well as congestion and body aches. Afebrile, normotensive, oxygen saturations >95% on RA through entire ED course, no tachycardia. CXR shows improved ventilation at the left lung base from prior treated PNA. No concern at this time for new PNA, PCP PNA. Will give antiviral for presumed viral infection. Discussed case with Dr. Judd Lien who agrees with plan. Return instructions given. Vital signs stable for discharge. Patient /  Family / Caregiver informed of clinical course, understand medical decision-making process, and agree with plan.     Mora Bellman, PA-C 07/31/13 [phone]

## 2013-07-30 NOTE — ED Notes (Signed)
Pt states that he is having rt sided chest pain as i am wheeling him out to wait for room ekg then done

## 2013-07-30 NOTE — ED Notes (Addendum)
Cough chills and  Aching all over since  2 days ago was seen recently and told he had   Early pneu has  Finished meds he states , his  grandbaby had  Flu last week

## 2013-07-31 NOTE — ED Provider Notes (Signed)
Medical screening examination/treatment/procedure(s) were performed by non-physician practitioner and as supervising physician I was immediately available for consultation/collaboration.  EKG Interpretation     Ventricular Rate:  92 PR Interval:  188 QRS Duration: 88 QT Interval:  372 QTC Calculation: 460 R Axis:   27 Text Interpretation:  Normal sinus rhythm Normal ECG           Medical screening examination/treatment/procedure(s) were performed by non-physician practitioner and as supervising physician I was immediately available for consultation/collaboration.     Geoffery Lyons, MD 07/31/13 [phone]

## 2013-08-04 ENCOUNTER — Encounter: Payer: Self-pay | Admitting: Internal Medicine

## 2013-08-04 ENCOUNTER — Ambulatory Visit (INDEPENDENT_AMBULATORY_CARE_PROVIDER_SITE_OTHER): Payer: Medicare Other | Admitting: Internal Medicine

## 2013-08-04 VITALS — BP 145/98 | HR 75 | Temp 97.9°F | Wt 160.0 lb

## 2013-08-04 DIAGNOSIS — Z21 Asymptomatic human immunodeficiency virus [HIV] infection status: Secondary | ICD-10-CM

## 2013-08-04 MED ORDER — ELVITEG-COBIC-EMTRICIT-TENOFDF 150-150-200-300 MG PO TABS: 1.0000 | ORAL_TABLET | Freq: Every day | ORAL | Status: DC
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
## 2013-08-11 ENCOUNTER — Emergency Department (HOSPITAL_COMMUNITY): Payer: Medicare Other

## 2013-08-11 ENCOUNTER — Encounter (HOSPITAL_COMMUNITY): Payer: Self-pay | Admitting: Emergency Medicine

## 2013-08-11 ENCOUNTER — Emergency Department (HOSPITAL_COMMUNITY)
Admission: EM | Admit: 2013-08-11 | Discharge: 2013-08-11 | Disposition: A | Discharge status: Home / Self Care | Payer: Medicare Other | Attending: Emergency Medicine | Admitting: Emergency Medicine

## 2013-08-11 DIAGNOSIS — Z87891 Personal history of nicotine dependence: Secondary | ICD-10-CM | POA: Insufficient documentation

## 2013-08-11 DIAGNOSIS — R05 Cough: Secondary | ICD-10-CM | POA: Insufficient documentation

## 2013-08-11 DIAGNOSIS — R000 Tachycardia, unspecified: Secondary | ICD-10-CM | POA: Insufficient documentation

## 2013-08-11 DIAGNOSIS — Z8669 Personal history of other diseases of the nervous system and sense organs: Secondary | ICD-10-CM | POA: Insufficient documentation

## 2013-08-11 DIAGNOSIS — Z8679 Personal history of other diseases of the circulatory system: Secondary | ICD-10-CM | POA: Insufficient documentation

## 2013-08-11 DIAGNOSIS — R0989 Other specified symptoms and signs involving the circulatory and respiratory systems: Secondary | ICD-10-CM | POA: Insufficient documentation

## 2013-08-11 DIAGNOSIS — I10 Essential (primary) hypertension: Secondary | ICD-10-CM | POA: Insufficient documentation

## 2013-08-11 DIAGNOSIS — Z8249 Family history of ischemic heart disease and other diseases of the circulatory system: Secondary | ICD-10-CM | POA: Insufficient documentation

## 2013-08-11 DIAGNOSIS — Z885 Allergy status to narcotic agent status: Secondary | ICD-10-CM | POA: Insufficient documentation

## 2013-08-11 DIAGNOSIS — Z8701 Personal history of pneumonia (recurrent): Secondary | ICD-10-CM | POA: Insufficient documentation

## 2013-08-11 DIAGNOSIS — Z21 Asymptomatic human immunodeficiency virus [HIV] infection status: Secondary | ICD-10-CM | POA: Insufficient documentation

## 2013-08-11 DIAGNOSIS — Z7982 Long term (current) use of aspirin: Secondary | ICD-10-CM | POA: Insufficient documentation

## 2013-08-11 DIAGNOSIS — R0602 Shortness of breath: Secondary | ICD-10-CM | POA: Insufficient documentation

## 2013-08-11 DIAGNOSIS — M545 Low back pain, unspecified: Secondary | ICD-10-CM | POA: Insufficient documentation

## 2013-08-11 DIAGNOSIS — Z79899 Other long term (current) drug therapy: Secondary | ICD-10-CM | POA: Insufficient documentation

## 2013-08-11 DIAGNOSIS — R0609 Other forms of dyspnea: Secondary | ICD-10-CM | POA: Insufficient documentation

## 2013-08-11 DIAGNOSIS — G8929 Other chronic pain: Secondary | ICD-10-CM | POA: Insufficient documentation

## 2013-08-11 DIAGNOSIS — Z888 Allergy status to other drugs, medicaments and biological substances status: Secondary | ICD-10-CM | POA: Insufficient documentation

## 2013-08-11 DIAGNOSIS — R059 Cough, unspecified: Secondary | ICD-10-CM | POA: Insufficient documentation

## 2013-08-11 DIAGNOSIS — Z8709 Personal history of other diseases of the respiratory system: Secondary | ICD-10-CM | POA: Insufficient documentation

## 2013-08-11 DIAGNOSIS — I723 Aneurysm of iliac artery: Secondary | ICD-10-CM | POA: Insufficient documentation

## 2013-08-11 DIAGNOSIS — J189 Pneumonia, unspecified organism: Secondary | ICD-10-CM | POA: Insufficient documentation

## 2013-08-11 DIAGNOSIS — Z8661 Personal history of infections of the central nervous system: Secondary | ICD-10-CM | POA: Insufficient documentation

## 2013-08-11 DIAGNOSIS — M519 Unspecified thoracic, thoracolumbar and lumbosacral intervertebral disc disorder: Secondary | ICD-10-CM | POA: Insufficient documentation

## 2013-08-11 DIAGNOSIS — R61 Generalized hyperhidrosis: Secondary | ICD-10-CM | POA: Insufficient documentation

## 2013-08-11 DIAGNOSIS — M129 Arthropathy, unspecified: Secondary | ICD-10-CM | POA: Insufficient documentation

## 2013-08-11 DIAGNOSIS — I2510 Atherosclerotic heart disease of native coronary artery without angina pectoris: Secondary | ICD-10-CM | POA: Insufficient documentation

## 2013-08-11 LAB — BASIC METABOLIC PANEL
BUN: 17 mg/dL (ref 6–23)
CO2: 24 mEq/L (ref 19–32)
Calcium: 9.3 mg/dL (ref 8.4–10.5)
Chloride: 103 mEq/L (ref 96–112)
Creatinine, Ser: 0.82 mg/dL (ref 0.50–1.35)
GFR calc Af Amer: >90 mL/min (ref >90)
GFR calc non Af Amer: >90 mL/min (ref >90)
Glucose, Bld: 86 mg/dL (ref 70–99)
Potassium: 3.6 mEq/L (ref 3.5–5.1)
Sodium: 135 mEq/L (ref 135–145)

## 2013-08-11 LAB — POCT I-STAT TROPONIN I: Troponin i, poc: 0 ng/mL (ref 0.00–0.08)

## 2013-08-11 LAB — CBC
HCT: 42.7 % (ref 39.0–52.0)
Hemoglobin: 14.5 g/dL (ref 13.0–17.0)
MCH: 27 pg (ref 26.0–34.0)
MCHC: 34 g/dL (ref 30.0–36.0)
MCV: 79.5 fL (ref 78.0–100.0)
Platelets: 306 10*3/uL (ref 150–400)
RBC: 5.37 MIL/uL (ref 4.22–5.81)
RDW: 15.4 % (ref 11.5–15.5)
WBC: 6.5 10*3/uL (ref 4.0–10.5)

## 2013-08-11 LAB — PRO B NATRIURETIC PEPTIDE: Pro B Natriuretic peptide (BNP): <5 pg/mL (ref 0–125)

## 2013-08-11 MED ORDER — MORPHINE SULFATE 4 MG/ML IJ SOLN
4.0000 mg | Freq: Once | INTRAMUSCULAR | Status: AC
  Administered 2013-08-11: 4 mg via INTRAMUSCULAR
  Filled 2013-08-11: qty 1

## 2013-08-11 MED ORDER — LEVOFLOXACIN 750 MG PO TABS: 750.0000 mg | ORAL_TABLET | Freq: Every day | ORAL | Status: DC

## 2013-08-11 MED ORDER — ONDANSETRON HCL 4 MG PO TABS: 4.0000 mg | ORAL_TABLET | Freq: Four times a day (QID) | ORAL | Status: DC

## 2013-08-11 MED ORDER — OXYCODONE-ACETAMINOPHEN 5-325 MG PO TABS: 1.0000 | ORAL_TABLET | Freq: Four times a day (QID) | ORAL | Status: DC | PRN

## 2013-08-11 NOTE — ED Notes (Signed)
Pt states since about 1430 started having chest pain with mid back pain and radiation to left arm, sob and hurts to take deep breath

## 2013-08-11 NOTE — ED Provider Notes (Signed)
CSN: [id number]     Arrival date & time 08/11/13  1714 History   First MD Initiated Contact with Patient 08/11/13 1909     Chief Complaint  Patient presents with  . Chest Pain   (Consider location/radiation/quality/duration/timing/severity/associated sxs/prior Treatment) HPI Comments: Patient is a 57 year old male with history of HIV, hypertension who presents today with sudden onset of sharp chest pain. It began around 2:30 this afternoon. The pain radiates to his back and into his left arm. He feels short of breath. He has associated pleuritic chest pain. He does report one episode of diaphoresis at 2:30 PM. He denies nausea, vomiting, abdominal pain. He has had a productive cough for the past week. He denies any fevers. He denies having coronary artery disease, stating he's seen by cardiologist in [place]. He had a negative stress test one year ago. He does have a positive family history with both his brother and his father dying from an MI in their mid 69s.  The history is provided by the patient. No language interpreter was used.    Past Medical History  Diagnosis Date  . HIV positive 2004  . Hypertension   . Meningitis   . Coronary artery disease   . Chest pain at rest 09/23/2012  . Chronic bronchitis     "q year last 5 yr or so" (09/24/2012)  . Exertional dyspnea   . Migraines   . Arthritis     "both shoulders" (09/24/2012)  . Chronic lower back pain   . Pneumonia   . Lumbar disc disease   . Iliac artery aneurysm     Right   Past Surgical History  Procedure Laterality Date  . Intussusception repair  10/2011  . Posterior lumbar fusion  1995  . Elbow surgery  ~ 1997    "removed some stones; right" (09/24/2012)  . Appendectomy  2013  . Back surgery  2006    4 BACK SURGERIES  . Abdominal surgery    . Spine surgery      Injury to back 1995   Family History  Problem Relation Age of Onset  . Heart disease Father   . Heart disease Brother   . Diabetes Brother    History   Substance Use Topics  . Smoking status: Former Smoker -- 0.10 packs/day for 45 years    Types: Cigarettes    Quit date: 09/24/2012  . Smokeless tobacco: Never Used     Comment: 09/24/2012 "been stopped now ~ 8 month".  3 per day now.  . Alcohol Use: No    Review of Systems  Constitutional: Positive for diaphoresis. Negative for fever and chills.  Respiratory: Positive for cough and shortness of breath.   Cardiovascular: Positive for chest pain. Negative for leg swelling.  Gastrointestinal: Negative for vomiting, abdominal pain and diarrhea.  All other systems reviewed and are negative.    Allergies  Bee venom; Flexeril; Hydrocodone; Nabumetone; [name]; Tramadol; Ace inhibitors; and Other  Home Medications   Current Outpatient Rx  Name  Route  Sig  Dispense  Refill  . acetaminophen (TYLENOL) 500 MG tablet   Oral   Take 1,000 mg by mouth 2 (two) times daily as needed for pain.         Marland Kitchen acetaminophen-codeine (TYLENOL #3) 300-30 MG per tablet   Oral   Take 1-2 tablets by mouth every 8 (eight) hours as needed for moderate pain.   15 tablet   0   . amLODipine (NORVASC) 5 MG tablet  Oral   Take 1 tablet (5 mg total) by mouth daily.   30 tablet   10   . aspirin 81 MG chewable tablet   Oral   Chew 81 mg by mouth daily.          Marland Kitchen elvitegravir-cobicistat-emtricitabine-tenofovir (STRIBILD) 150-150-200-300 MG TABS tablet   Oral   Take 1 tablet by mouth daily with breakfast.   30 tablet   11   . EPINEPHrine (EPI-PEN) 0.3 mg/0.3 mL SOAJ   Intramuscular   Inject 0.3 mg into the muscle once as needed (Anaphylaxis).          . Multiple Vitamin (MULTIVITAMIN WITH MINERALS) TABS   Oral   Take 1 tablet by mouth daily.         . nitroGLYCERIN (NITROSTAT) 0.4 MG SL tablet   Sublingual   Place 0.4 mg under the tongue every 5 (five) minutes as needed for chest pain.         Marland Kitchen oseltamivir (TAMIFLU) 75 MG capsule   Oral   Take 1 capsule (75 mg total) by mouth  every 12 (twelve) hours.   10 capsule   0   . pseudoephedrine (SUDAFED) 60 MG tablet   Oral   Take 1 tablet (60 mg total) by mouth every 8 (eight) hours as needed for congestion.   30 tablet   0    BP 127/84  Pulse 80  Temp(Src) 98.4 F (36.9 C) (Oral)  Resp 23  Wt 162 lb (73.483 kg)  SpO2 100% Physical Exam  Nursing note and vitals reviewed. Constitutional: He is oriented to person, place, and time. He appears well-developed and well-nourished. No distress.  HENT:  Head: Normocephalic and atraumatic.  Right Ear: External ear normal.  Left Ear: External ear normal.  Nose: Nose normal.  Eyes: Conjunctivae are normal.  Neck: Normal range of motion. No tracheal deviation present.  Cardiovascular: Normal rate, regular rhythm and normal heart sounds.   Pulmonary/Chest: Effort normal. No stridor. He has rales in the right lower field and the left lower field.  Abdominal: Soft. He exhibits no distension. There is no tenderness.  Musculoskeletal: Normal range of motion.  Neurological: He is alert and oriented to person, place, and time.  Skin: Skin is warm and dry. He is not diaphoretic.  Psychiatric: He has a normal mood and affect. His behavior is normal.    ED Course  Procedures (including critical care time) Labs Review Labs Reviewed  CBC  BASIC METABOLIC PANEL  PRO B NATRIURETIC PEPTIDE  POCT I-STAT TROPONIN I   Imaging Review Dg Chest 2 View  08/11/2013   CLINICAL DATA:  Chest and back pain with dyspnea  EXAM: CHEST  2 VIEW  COMPARISON:  July 30, 2013  FINDINGS: The lungs are mildly hyperinflated. There are increased lung markings at the left lung base today suggesting subsegmental atelectasis or early pneumonia. This is most conspicuous on the lateral film. Coarse infrahilar lung markings on the right are not greatly changed. There is no pleural effusion. The cardiac silhouette is normal in size. There is mild tortuosity of the descending thoracic aorta. There is  no pleural effusion. The mediastinum is normal in width. There is no pneumothorax. The bony thorax exhibits no acute abnormality.  IMPRESSION: There is mild hyperinflation consistent with COPD. Increased lung markings at the left lung base posteriorly likely reflect subsegmental atelectasis or early pneumonia. There is no evidence of CHF.   Electronically Signed   By: David  [place]  On: 08/11/2013 18:06    EKG Interpretation   None       MDM   1. CAP (community acquired pneumonia)    Patient has been diagnosed with CAP via chest xray. Pt is not ill appearing, and does not have multiple co morbidities, therefore I feel like the they can be treated as an OP with abx therapy. Pt has been advised to return to the ED if symptoms worsen or they do not improve. Pt verbalizes understanding and is agreeable with plan. Dr. Rubin Payor evaluated patient and agrees with plan. Vital signs stable for discharge.      Mora Bellman, PA-C 08/13/13 0930

## 2013-08-11 NOTE — ED Notes (Signed)
Pt c/o generalized cp that started around 1430. Pt states he feels sob and deep breathing makes pain worse. Pt described pain as sharp stabbing.

## 2013-08-14 NOTE — ED Provider Notes (Signed)
Medical screening examination/treatment/procedure(s) were conducted as a shared visit with non-physician practitioner(s) and myself.  I personally evaluated the patient during the encounter.  EKG Interpretation    Date/Time:  Monday August 11 2013 17:20:53 EST Ventricular Rate:  101 PR Interval:  196 QRS Duration: 86 QT Interval:  358 QTC Calculation: 464 R Axis:   -17 Text Interpretation:  Sinus tachycardia Possible Left atrial enlargement Borderline ECG ED PHYSICIAN INTERPRETATION AVAILABLE IN [facility] Confirmed by TEST, RECORD ([id number]) on 08/13/2013 2:24:36 PM           Patient with sharp chest pain. Has HIV but good CD4 count. Not ill-appearing. Possible pneumonia on x-ray. Patient has been coughing with some sputum production. We'll treat with antibiotics and patient will followup  Juliet Rude. Rubin Payor, MD 08/14/13 1100

## 2013-08-15 ENCOUNTER — Emergency Department (HOSPITAL_COMMUNITY): Payer: Medicare Other

## 2013-08-15 ENCOUNTER — Encounter (HOSPITAL_COMMUNITY): Payer: Self-pay | Admitting: Emergency Medicine

## 2013-08-15 ENCOUNTER — Emergency Department (HOSPITAL_COMMUNITY)
Admission: EM | Admit: 2013-08-15 | Discharge: 2013-08-15 | Disposition: A | Discharge status: Home / Self Care | Payer: Medicare Other | Attending: Emergency Medicine | Admitting: Emergency Medicine

## 2013-08-15 DIAGNOSIS — I2510 Atherosclerotic heart disease of native coronary artery without angina pectoris: Secondary | ICD-10-CM | POA: Insufficient documentation

## 2013-08-15 DIAGNOSIS — M129 Arthropathy, unspecified: Secondary | ICD-10-CM | POA: Insufficient documentation

## 2013-08-15 DIAGNOSIS — M545 Low back pain, unspecified: Secondary | ICD-10-CM | POA: Insufficient documentation

## 2013-08-15 DIAGNOSIS — Z21 Asymptomatic human immunodeficiency virus [HIV] infection status: Secondary | ICD-10-CM | POA: Insufficient documentation

## 2013-08-15 DIAGNOSIS — I723 Aneurysm of iliac artery: Secondary | ICD-10-CM | POA: Insufficient documentation

## 2013-08-15 DIAGNOSIS — Z8661 Personal history of infections of the central nervous system: Secondary | ICD-10-CM | POA: Insufficient documentation

## 2013-08-15 DIAGNOSIS — Z87891 Personal history of nicotine dependence: Secondary | ICD-10-CM | POA: Insufficient documentation

## 2013-08-15 DIAGNOSIS — R0789 Other chest pain: Secondary | ICD-10-CM

## 2013-08-15 DIAGNOSIS — J189 Pneumonia, unspecified organism: Secondary | ICD-10-CM | POA: Insufficient documentation

## 2013-08-15 DIAGNOSIS — Z8709 Personal history of other diseases of the respiratory system: Secondary | ICD-10-CM | POA: Insufficient documentation

## 2013-08-15 DIAGNOSIS — G8929 Other chronic pain: Secondary | ICD-10-CM | POA: Insufficient documentation

## 2013-08-15 DIAGNOSIS — I10 Essential (primary) hypertension: Secondary | ICD-10-CM | POA: Insufficient documentation

## 2013-08-15 DIAGNOSIS — R071 Chest pain on breathing: Secondary | ICD-10-CM | POA: Insufficient documentation

## 2013-08-15 DIAGNOSIS — Z8669 Personal history of other diseases of the nervous system and sense organs: Secondary | ICD-10-CM | POA: Insufficient documentation

## 2013-08-15 DIAGNOSIS — M519 Unspecified thoracic, thoracolumbar and lumbosacral intervertebral disc disorder: Secondary | ICD-10-CM | POA: Insufficient documentation

## 2013-08-15 LAB — BASIC METABOLIC PANEL
BUN: 26 mg/dL — ABNORMAL HIGH (ref 6–23)
CO2: 25 mEq/L (ref 19–32)
Calcium: 9.4 mg/dL (ref 8.4–10.5)
Chloride: 102 mEq/L (ref 96–112)
Creatinine, Ser: 1.17 mg/dL (ref 0.50–1.35)
GFR calc Af Amer: 78 mL/min — ABNORMAL LOW (ref >90)
GFR calc non Af Amer: 67 mL/min — ABNORMAL LOW (ref >90)
Glucose, Bld: 86 mg/dL (ref 70–99)
Potassium: 4.1 mEq/L (ref 3.5–5.1)
Sodium: 136 mEq/L (ref 135–145)

## 2013-08-15 LAB — CBC
HCT: 43.4 % (ref 39.0–52.0)
Hemoglobin: 14.8 g/dL (ref 13.0–17.0)
MCH: 27.4 pg (ref 26.0–34.0)
MCHC: 34.1 g/dL (ref 30.0–36.0)
MCV: 80.2 fL (ref 78.0–100.0)
Platelets: 304 K/uL (ref 150–400)
RBC: 5.41 MIL/uL (ref 4.22–5.81)
RDW: 15.5 % (ref 11.5–15.5)
WBC: 6.3 K/uL (ref 4.0–10.5)

## 2013-08-15 LAB — POCT I-STAT TROPONIN I: Troponin i, poc: 0 ng/mL (ref 0.00–0.08)

## 2013-08-15 MED ORDER — OXYCODONE-ACETAMINOPHEN 5-325 MG PO TABS
2.0000 | ORAL_TABLET | Freq: Once | ORAL | Status: AC
  Administered 2013-08-15: 2 via ORAL
  Filled 2013-08-15: qty 2

## 2013-08-15 MED ORDER — OXYCODONE-ACETAMINOPHEN 5-325 MG PO TABS: 1.0000 | ORAL_TABLET | Freq: Four times a day (QID) | ORAL | Status: DC | PRN

## 2013-08-15 NOTE — ED Notes (Signed)
Pt c/o generalized CP x 5 days worse with movement; pt seen here for same on 11/24

## 2013-08-15 NOTE — ED Provider Notes (Signed)
TIME SEEN: 4:21 PM  CHIEF COMPLAINT: Chest pain  HPI: Patient is a 57 year old male with a history of HIV with a recent viral load of 557 and CD4 count of 740 on 03/25/13 who is followed by infectious disease, hypertension who presents emergency department with complaints of a pulling, sore chest pain. Denies radiation. He reports it is worse with movement of his arms and palpation of his chest. No alleviating factors. It is not exertional or pleuritic. It has been present for 11 days. It is constant but waxes and wanes. Denies any shortness of breath, nausea or vomiting, diaphoresis or dizziness. No recent fevers or cough. Patient has been seen multiple times in the emergency department for similar symptoms. He was seen 4 days ago and diagnosed with community-acquired pneumonia for which she is taking antibiotics. Denies any fever or cough currently. States he was at his primary care doctor's office today when the chest pain got so bad he could not wait to be seen. He denies missing any of his HIV medication. No prior history of MI or cardiac catheterization. His last stress test was in [place] last year and was reportedly normal. Denies a history of pulmonary embolus or DVT. No history of prolonged immobilization such as hospitalization or long flight, recent fracture, surgery, trauma. No lower extremity swelling or pain.  ROS: See HPI Constitutional: no fever  Eyes: no drainage  ENT: no runny nose   Cardiovascular:  no chest pain  Resp: no SOB  GI: no vomiting GU: no dysuria Integumentary: no rash  Allergy: no hives  Musculoskeletal: no leg swelling  Neurological: no slurred speech ROS otherwise negative  PAST MEDICAL HISTORY/PAST SURGICAL HISTORY:  Past Medical History  Diagnosis Date  . HIV positive 2004  . Hypertension   . Meningitis   . Coronary artery disease   . Chest pain at rest 09/23/2012  . Chronic bronchitis     "q year last 5 yr or so" (09/24/2012)  .  Exertional dyspnea   . Migraines   . Arthritis     "both shoulders" (09/24/2012)  . Chronic lower back pain   . Pneumonia   . Lumbar disc disease   . Iliac artery aneurysm     Right    MEDICATIONS:  Prior to Admission medications   Medication Sig Start Date End Date Taking? Authorizing Provider  acetaminophen (TYLENOL) 500 MG tablet Take 1,000 mg by mouth 2 (two) times daily as needed for pain.    Historical Provider, MD  acetaminophen-codeine (TYLENOL #3) 300-30 MG per tablet Take 1-2 tablets by mouth every 8 (eight) hours as needed for moderate pain. 07/30/13   Mora Bellman, PA-C  amLODipine (NORVASC) 5 MG tablet Take 1 tablet (5 mg total) by mouth daily. 04/04/13   Otis Brace, MD  aspirin 81 MG chewable tablet Chew 81 mg by mouth daily.  01/14/12   Historical Provider, MD  elvitegravir-cobicistat-emtricitabine-tenofovir (STRIBILD) 150-150-200-300 MG TABS tablet Take 1 tablet by mouth daily with breakfast. 08/04/13   Cliffton Asters, MD  EPINEPHrine (EPI-PEN) 0.3 mg/0.3 mL SOAJ Inject 0.3 mg into the muscle once as needed (Anaphylaxis).  03/09/13   Roxy Horseman, PA-C  levofloxacin (LEVAQUIN) 750 MG tablet Take 1 tablet (750 mg total) by mouth daily. X 7 days 08/11/13   Mora Bellman, PA-C  Multiple Vitamin (MULTIVITAMIN WITH MINERALS) TABS Take 1 tablet by mouth daily.    Historical Provider, MD  nitroGLYCERIN (NITROSTAT) 0.4 MG SL tablet Place 0.4 mg under  the tongue every 5 (five) minutes as needed for chest pain. Pt hasn't had to use rx 08/11/13   Bronson Curb, MD  ondansetron (ZOFRAN) 4 MG tablet Take 1 tablet (4 mg total) by mouth every 6 (six) hours. 08/11/13   Mora Bellman, PA-C  oseltamivir (TAMIFLU) 75 MG capsule Take 1 capsule (75 mg total) by mouth every 12 (twelve) hours. 07/30/13   Mora Bellman, PA-C  oxyCODONE-acetaminophen (PERCOCET/ROXICET) 5-325 MG per tablet Take 1-2 tablets by mouth every 6 (six) hours as needed for severe pain. 08/11/13   Mora Bellman, PA-C  PRESCRIPTION MEDICATION Take 1 tablet by mouth daily. Pt states that he is no longer on the amlodipine 5  Mg but not sure what the name of the new med is. Called [facility] and the pt has not had any other BP meds filled since July/2014    Historical Provider, MD  pseudoephedrine (SUDAFED) 60 MG tablet Take 1 tablet (60 mg total) by mouth every 8 (eight) hours as needed for congestion. 07/30/13   Mora Bellman, PA-C    ALLERGIES:  Allergies  Allergen Reactions  . Bee Venom Anaphylaxis  . Flexeril [Cyclobenzaprine Hcl] Anaphylaxis, Shortness Of Breath and Rash  . Hydrocodone Itching  . Nabumetone Shortness Of Breath, Itching and Other (See Comments)    "made me feel like my wind was maybe cutting off" (09/24/2012)  . [name] [Naproxen] Shortness Of Breath, Nausea And Vomiting and Rash    itching  . Tramadol Shortness Of Breath and Rash    Multiple previous morphine administrations ok (07/12/11-DJ) itching  . Ace Inhibitors Itching    Itching   . Other Itching    Itching of throat.(grits and grape jelly and cole slaw    SOCIAL HISTORY:  History  Substance Use Topics  . Smoking status: Former Smoker -- 0.10 packs/day for 45 years    Types: Cigarettes    Quit date: 09/24/2012  . Smokeless tobacco: Never Used     Comment: 09/24/2012 "been stopped now ~ 8 month".  3 per day now.  . Alcohol Use: No    FAMILY HISTORY: Family History  Problem Relation Age of Onset  . Heart disease Father   . Heart disease Brother   . Diabetes Brother    brother with MI at age 22, father with MI at age 42  EXAM: BP 129/94  Pulse 102  Temp(Src) 97.9 F (36.6 C) (Oral)  Resp 18  SpO2 99% CONSTITUTIONAL: Alert and oriented and responds appropriately to questions. Well-appearing; well-nourished HEAD: Normocephalic EYES: Conjunctivae clear, PERRL ENT: normal nose; no rhinorrhea; moist mucous membranes; pharynx without lesions noted NECK: Supple, no meningismus, no LAD  CARD:  RRR; S1 and S2 appreciated; no murmurs, no clicks, no rubs, no gallops RESP: Normal chest excursion without splinting or tachypnea; breath sounds clear and equal bilaterally; no wheezes, no rhonchi, no rales, extremely tender to palpation diffusely over his chest wall without crepitus or ecchymosis ABD/GI: Normal bowel sounds; non-distended; soft, non-tender, no rebound, no guarding BACK:  The back appears normal and is non-tender to palpation, there is no CVA tenderness EXT: Normal ROM in all joints; non-tender to palpation; no edema; normal capillary refill; no cyanosis    SKIN: Normal color for age and race; warm NEURO: Moves all extremities equally PSYCH: The patient's mood and manner are appropriate. Grooming and personal hygiene are appropriate.  MEDICAL DECISION MAKING: Patient here with atypical, chest wall pain. He is been seen multiple times  in the past for similar symptoms. His pain has been constant for the past 11 days. He has no risk factors for pulmonary embolus and is PERC negative.  He does have risk factors for ACS but his pain is very atypical. His EKG shows no acute changes. His labs are reassuring in his troponin is negative. Given he has had pain constantly for 11 days, I do not feel he needs a second set of cardiac enzymes. I am not concerned for aortic dissection. I am also not concerned for opportunistic infections as his last CD4 count was 740. Patient is still on antibiotics for recent diagnosis of community-acquired pneumonia. Have encouraged him to continue this medication. His chest x-ray today is clear. Will give pain medication for his chest wall pain and discharge with very small amount of narcotic pain medication. Have discussed with patient that he needs to followup with his primary care physician. He verbalized understanding and is comfortable plan.     EKG Interpretation    Date/Time:  Friday August 15 2013 15:26:28 EST Ventricular Rate:  102 PR  Interval:  172 QRS Duration: 86 QT Interval:  354 QTC Calculation: 461 R Axis:   -64 Text Interpretation:  Sinus tachycardia Unchanged compared to ECG 10/28/2011 Confirmed by WARD  DO, KRISTEN ([id number]) on 08/15/2013 4:34:46 PM             Layla Maw Ward, DO 08/15/13 1715

## 2013-08-30 ENCOUNTER — Encounter (HOSPITAL_COMMUNITY): Payer: Self-pay | Admitting: Emergency Medicine

## 2013-08-30 ENCOUNTER — Emergency Department (HOSPITAL_COMMUNITY)
Admission: EM | Admit: 2013-08-30 | Discharge: 2013-08-30 | Disposition: A | Discharge status: Home / Self Care | Payer: Medicare Other | Attending: Emergency Medicine | Admitting: Emergency Medicine

## 2013-08-30 ENCOUNTER — Emergency Department (HOSPITAL_COMMUNITY): Payer: Medicare Other

## 2013-08-30 DIAGNOSIS — I10 Essential (primary) hypertension: Secondary | ICD-10-CM | POA: Insufficient documentation

## 2013-08-30 DIAGNOSIS — Z21 Asymptomatic human immunodeficiency virus [HIV] infection status: Secondary | ICD-10-CM | POA: Insufficient documentation

## 2013-08-30 DIAGNOSIS — Y92410 Unspecified street and highway as the place of occurrence of the external cause: Secondary | ICD-10-CM | POA: Insufficient documentation

## 2013-08-30 DIAGNOSIS — Z7982 Long term (current) use of aspirin: Secondary | ICD-10-CM | POA: Insufficient documentation

## 2013-08-30 DIAGNOSIS — Y9389 Activity, other specified: Secondary | ICD-10-CM | POA: Insufficient documentation

## 2013-08-30 DIAGNOSIS — Z79899 Other long term (current) drug therapy: Secondary | ICD-10-CM | POA: Insufficient documentation

## 2013-08-30 DIAGNOSIS — Z87891 Personal history of nicotine dependence: Secondary | ICD-10-CM | POA: Insufficient documentation

## 2013-08-30 DIAGNOSIS — I2510 Atherosclerotic heart disease of native coronary artery without angina pectoris: Secondary | ICD-10-CM | POA: Insufficient documentation

## 2013-08-30 DIAGNOSIS — G8929 Other chronic pain: Secondary | ICD-10-CM | POA: Insufficient documentation

## 2013-08-30 DIAGNOSIS — M19019 Primary osteoarthritis, unspecified shoulder: Secondary | ICD-10-CM | POA: Insufficient documentation

## 2013-08-30 DIAGNOSIS — Z8701 Personal history of pneumonia (recurrent): Secondary | ICD-10-CM | POA: Insufficient documentation

## 2013-08-30 DIAGNOSIS — X500XXA Overexertion from strenuous movement or load, initial encounter: Secondary | ICD-10-CM | POA: Insufficient documentation

## 2013-08-30 DIAGNOSIS — Z8661 Personal history of infections of the central nervous system: Secondary | ICD-10-CM | POA: Insufficient documentation

## 2013-08-30 DIAGNOSIS — S39012A Strain of muscle, fascia and tendon of lower back, initial encounter: Secondary | ICD-10-CM

## 2013-08-30 DIAGNOSIS — Z981 Arthrodesis status: Secondary | ICD-10-CM | POA: Insufficient documentation

## 2013-08-30 DIAGNOSIS — S335XXA Sprain of ligaments of lumbar spine, initial encounter: Secondary | ICD-10-CM | POA: Insufficient documentation

## 2013-08-30 MED ORDER — TIZANIDINE HCL 4 MG PO TABS: 4.0000 mg | ORAL_TABLET | Freq: Four times a day (QID) | ORAL | Status: DC | PRN

## 2013-08-30 MED ORDER — OXYCODONE-ACETAMINOPHEN 5-325 MG PO TABS
1.0000 | ORAL_TABLET | Freq: Once | ORAL | Status: AC
  Administered 2013-08-30: 1 via ORAL
  Filled 2013-08-30: qty 1

## 2013-08-30 MED ORDER — ACETAMINOPHEN 500 MG PO TABS: 500.0000 mg | ORAL_TABLET | Freq: Four times a day (QID) | ORAL | Status: DC | PRN

## 2013-08-30 NOTE — ED Provider Notes (Signed)
CSN: [id number]     Arrival date & time 08/30/13  1011 History   First MD Initiated Contact with Patient 08/30/13 1111     Chief Complaint  Patient presents with  . Back Pain   (Consider location/radiation/quality/duration/timing/severity/associated sxs/prior Treatment) HPI  57 year old male history of chronic low back pain, history of HIV presents complaining of low back pain. Patient states today he was trying to change this flat tire on the side of the road and well. At the time he felt a pop followed with sudden onset of pain to his low back. Pain is sharp, radiates across the back, worsening with sitting and improves when he leans forward.  Pain has been persistent for the past 2 hrs.  No specific treatment tried.  No associated fever, numbness, weakness, urinary or bowel incontinence, or saddle paresthesia noted. Patient felt pain is similar to his chronic pain with a flareup.  Past Medical History  Diagnosis Date  . HIV positive 2004  . Hypertension   . Meningitis   . Coronary artery disease   . Chest pain at rest 09/23/2012  . Chronic bronchitis     "q year last 5 yr or so" (09/24/2012)  . Exertional dyspnea   . Migraines   . Arthritis     "both shoulders" (09/24/2012)  . Chronic lower back pain   . Pneumonia   . Lumbar disc disease   . Iliac artery aneurysm     Right   Past Surgical History  Procedure Laterality Date  . Intussusception repair  10/2011  . Posterior lumbar fusion  1995  . Elbow surgery  ~ 1997    "removed some stones; right" (09/24/2012)  . Appendectomy  2013  . Back surgery  2006    4 BACK SURGERIES  . Abdominal surgery    . Spine surgery      Injury to back 1995   Family History  Problem Relation Age of Onset  . Heart disease Father   . Heart disease Brother   . Diabetes Brother    History  Substance Use Topics  . Smoking status: Former Smoker -- 0.10 packs/day for 45 years    Types: Cigarettes    Quit date: 09/24/2012  . Smokeless tobacco:  Never Used     Comment: 09/24/2012 "been stopped now ~ 8 month".  3 per day now.  . Alcohol Use: No    Review of Systems  Constitutional: Negative for fever.  Musculoskeletal: Positive for back pain.  Skin: Negative for rash and wound.  Neurological: Negative for numbness.    Allergies  Bee venom; Flexeril; Hydrocodone; Nabumetone; [name]; Tramadol; Ace inhibitors; and Other  Home Medications   Current Outpatient Rx  Name  Route  Sig  Dispense  Refill  . amLODipine (NORVASC) 5 MG tablet   Oral   Take 1 tablet (5 mg total) by mouth daily.   30 tablet   10   . aspirin 81 MG chewable tablet   Oral   Chew 81 mg by mouth daily.          Marland Kitchen elvitegravir-cobicistat-emtricitabine-tenofovir (STRIBILD) 150-150-200-300 MG TABS tablet   Oral   Take 1 tablet by mouth daily with breakfast.   30 tablet   11   . Multiple Vitamin (MULTIVITAMIN WITH MINERALS) TABS   Oral   Take 1 tablet by mouth daily.         Marland Kitchen EPINEPHrine (EPI-PEN) 0.3 mg/0.3 mL SOAJ   Intramuscular   Inject 0.3 mg into  the muscle once as needed (Anaphylaxis).          . nitroGLYCERIN (NITROSTAT) 0.4 MG SL tablet   Sublingual   Place 0.4 mg under the tongue every 5 (five) minutes as needed for chest pain. Pt hasn't had to use rx          BP 121/83  Pulse 106  Temp(Src) 97.3 F (36.3 C) (Oral)  Resp 18  SpO2 99% Physical Exam  Constitutional: He appears well-developed and well-nourished. No distress.  HENT:  Head: Atraumatic.  Eyes: Conjunctivae are normal.  Neck: Normal range of motion. Neck supple.  Abdominal: There is no tenderness.  Musculoskeletal: He exhibits tenderness (tenderness in lumbar and paralumbar on palpation. Well-healing midline lumbar surgical scar noted without any evidence of abscess or infection. Increasing pain with lumbar extension and rotation.).  Neurological: He is alert.  Patellar deep tendon reflex intact bilaterally, no foot drop, able to ambulate.  Skin: No rash  noted.  Psychiatric: He has a normal mood and affect.    ED Course  Procedures (including critical care time)  Patient here with acute on chronic low back pain after changing a flat tire.  Will give pain medication and will obtain x-ray. No step-off to suggest acute fracture. No red flags.  I review pt's [place] controlled substance database: pt has been prescribed narcotic pain meds through several different ER provider within the past 2-3 months.    1:44 PM Shows no acute fracture. Likely strain. Rice therapy discussed. Will prescribe a muscle relaxant and tylenol.    Labs Review Labs Reviewed - No data to display Imaging Review Dg Lumbar Spine Complete  08/30/2013   CLINICAL DATA:  History of lumbar fusion with recent injury  EXAM: LUMBAR SPINE - COMPLETE 4+ VIEW  COMPARISON:  06/30/2013.  FINDINGS: Vertebral body height is well maintained. Postsurgical changes are noted stable from the previous exam. No acute fracture is identified. No pars defects are seen.  IMPRESSION: Chronic changes without acute abnormality.   Electronically Signed   By: Alcide Clever M.D.   On: 08/30/2013 13:14    EKG Interpretation   None       MDM   1. Low back strain, initial encounter    BP 121/83  Pulse 106  Temp(Src) 97.3 F (36.3 C) (Oral)  Resp 18  SpO2 99%  I have reviewed nursing notes and vital signs. I personally reviewed the imaging tests through PACS system  I reviewed available ER/hospitalization records thought the EMR     Fayrene Helper, PA-C 08/30/13 1344

## 2013-08-30 NOTE — ED Notes (Signed)
PT ambulated with baseline gait; VSS; A&Ox3; no signs of distress; respirations even and unlabored; skin warm and dry; no questions upon discharge.  

## 2013-08-30 NOTE — ED Notes (Addendum)
Pain in lower back when changing a tire; radiates up both sides of back when he tries to sit up; hx of fusion. No incontinence.

## 2013-08-30 NOTE — ED Notes (Signed)
Pt returned from xray

## 2013-08-30 NOTE — ED Provider Notes (Signed)
  Medical screening examination/treatment/procedure(s) were performed by non-physician practitioner and as supervising physician I was immediately available for consultation/collaboration.  EKG Interpretation   None          Gerhard Munch, MD 08/30/13 1649

## 2013-08-30 NOTE — ED Notes (Signed)
Pt c/o lower back pain after changing flat tire this am that is chronic in nature with a flare up

## 2013-09-08 ENCOUNTER — Encounter (HOSPITAL_COMMUNITY): Payer: Self-pay | Admitting: Emergency Medicine

## 2013-09-08 ENCOUNTER — Emergency Department (HOSPITAL_COMMUNITY)
Admission: EM | Admit: 2013-09-08 | Discharge: 2013-09-08 | Disposition: A | Discharge status: Home / Self Care | Payer: Medicare Other | Attending: Emergency Medicine | Admitting: Emergency Medicine

## 2013-09-08 DIAGNOSIS — Z79899 Other long term (current) drug therapy: Secondary | ICD-10-CM | POA: Insufficient documentation

## 2013-09-08 DIAGNOSIS — Y9389 Activity, other specified: Secondary | ICD-10-CM | POA: Insufficient documentation

## 2013-09-08 DIAGNOSIS — X500XXA Overexertion from strenuous movement or load, initial encounter: Secondary | ICD-10-CM | POA: Insufficient documentation

## 2013-09-08 DIAGNOSIS — Z8669 Personal history of other diseases of the nervous system and sense organs: Secondary | ICD-10-CM | POA: Insufficient documentation

## 2013-09-08 DIAGNOSIS — S8992XA Unspecified injury of left lower leg, initial encounter: Secondary | ICD-10-CM

## 2013-09-08 DIAGNOSIS — M25552 Pain in left hip: Secondary | ICD-10-CM

## 2013-09-08 DIAGNOSIS — M19019 Primary osteoarthritis, unspecified shoulder: Secondary | ICD-10-CM | POA: Insufficient documentation

## 2013-09-08 DIAGNOSIS — I2510 Atherosclerotic heart disease of native coronary artery without angina pectoris: Secondary | ICD-10-CM | POA: Insufficient documentation

## 2013-09-08 DIAGNOSIS — Z21 Asymptomatic human immunodeficiency virus [HIV] infection status: Secondary | ICD-10-CM | POA: Insufficient documentation

## 2013-09-08 DIAGNOSIS — Z7982 Long term (current) use of aspirin: Secondary | ICD-10-CM | POA: Insufficient documentation

## 2013-09-08 DIAGNOSIS — I10 Essential (primary) hypertension: Secondary | ICD-10-CM | POA: Insufficient documentation

## 2013-09-08 DIAGNOSIS — S8990XA Unspecified injury of unspecified lower leg, initial encounter: Secondary | ICD-10-CM | POA: Insufficient documentation

## 2013-09-08 DIAGNOSIS — W19XXXA Unspecified fall, initial encounter: Secondary | ICD-10-CM

## 2013-09-08 DIAGNOSIS — S79919A Unspecified injury of unspecified hip, initial encounter: Secondary | ICD-10-CM | POA: Insufficient documentation

## 2013-09-08 DIAGNOSIS — G8929 Other chronic pain: Secondary | ICD-10-CM | POA: Insufficient documentation

## 2013-09-08 DIAGNOSIS — Y9289 Other specified places as the place of occurrence of the external cause: Secondary | ICD-10-CM | POA: Insufficient documentation

## 2013-09-08 DIAGNOSIS — Z8701 Personal history of pneumonia (recurrent): Secondary | ICD-10-CM | POA: Insufficient documentation

## 2013-09-08 DIAGNOSIS — Z87891 Personal history of nicotine dependence: Secondary | ICD-10-CM | POA: Insufficient documentation

## 2013-09-08 DIAGNOSIS — Z8709 Personal history of other diseases of the respiratory system: Secondary | ICD-10-CM | POA: Insufficient documentation

## 2013-09-08 MED ORDER — OXYCODONE HCL 5 MG/5ML PO SOLN
5.0000 mg | Freq: Once | ORAL | Status: AC
  Administered 2013-09-08: 5 mg via ORAL
  Filled 2013-09-08: qty 5

## 2013-09-08 MED ORDER — OXYCODONE HCL 5 MG/5ML PO SOLN: 5.0000 mg | Freq: Four times a day (QID) | ORAL | Status: DC | PRN

## 2013-09-08 NOTE — ED Notes (Addendum)
Pt c/o L hip and L knee pain after "stepping in a whole in my backyard."  Pain score 9/10.  Pt sts "I think I heard something pop."  Pt ambulated into department.

## 2013-09-08 NOTE — ED Provider Notes (Signed)
CSN: [id number]     Arrival date & time 09/08/13  1351 History   First MD Initiated Contact with Patient 09/08/13 1439     Chief Complaint  Patient presents with  . Hip Pain  . Leg Pain   (Consider location/radiation/quality/duration/timing/severity/associated sxs/prior Treatment) HPI Comments: Patient states he stepped in a hole in his backyard today, twisting his knee and hip since that time.  He has had pain.  That radiates from his knee to his headache.  He took a Tylenol without relief. He is ambulatory in the emergency department  Patient is a 57 y.o. male presenting with hip pain and leg pain. The history is provided by the patient.  Hip Pain This is a new problem. The current episode started today. The problem occurs constantly. The problem has been unchanged. Associated symptoms include arthralgias. Pertinent negatives include no fever or joint swelling. The symptoms are aggravated by walking. He has tried acetaminophen for the symptoms. The treatment provided no relief.  Leg Pain Associated symptoms: no fever     Past Medical History  Diagnosis Date  . HIV positive 2004  . Hypertension   . Meningitis   . Coronary artery disease   . Chest pain at rest 09/23/2012  . Chronic bronchitis     "q year last 5 yr or so" (09/24/2012)  . Exertional dyspnea   . Migraines   . Arthritis     "both shoulders" (09/24/2012)  . Chronic lower back pain   . Pneumonia   . Lumbar disc disease   . Iliac artery aneurysm     Right   Past Surgical History  Procedure Laterality Date  . Intussusception repair  10/2011  . Posterior lumbar fusion  1995  . Elbow surgery  ~ 1997    "removed some stones; right" (09/24/2012)  . Appendectomy  2013  . Back surgery  2006    4 BACK SURGERIES  . Abdominal surgery    . Spine surgery      Injury to back 1995   Family History  Problem Relation Age of Onset  . Heart disease Father   . Heart disease Brother   . Diabetes Brother    History  Substance  Use Topics  . Smoking status: Former Smoker -- 0.10 packs/day for 45 years    Types: Cigarettes    Quit date: 09/24/2012  . Smokeless tobacco: Never Used     Comment: 09/24/2012 "been stopped now ~ 8 month".  3 per day now.  . Alcohol Use: No    Review of Systems  Constitutional: Negative for fever.  Musculoskeletal: Positive for arthralgias and gait problem. Negative for joint swelling.  All other systems reviewed and are negative.    Allergies  Bee venom; Flexeril; Hydrocodone; Nabumetone; [name]; Tramadol; Ace inhibitors; and Other  Home Medications   Current Outpatient Rx  Name  Route  Sig  Dispense  Refill  . acetaminophen (TYLENOL) 500 MG tablet   Oral   Take 1 tablet (500 mg total) by mouth every 6 (six) hours as needed.   30 tablet   0   . amLODipine (NORVASC) 5 MG tablet   Oral   Take 1 tablet (5 mg total) by mouth daily.   30 tablet   10   . aspirin 81 MG chewable tablet   Oral   Chew 81 mg by mouth daily.          Marland Kitchen elvitegravir-cobicistat-emtricitabine-tenofovir (STRIBILD) 150-150-200-300 MG TABS tablet   Oral  Take 1 tablet by mouth daily with breakfast.   30 tablet   11   . EPINEPHrine (EPI-PEN) 0.3 mg/0.3 mL SOAJ   Intramuscular   Inject 0.3 mg into the muscle once as needed (Anaphylaxis).          . Multiple Vitamin (MULTIVITAMIN WITH MINERALS) TABS   Oral   Take 1 tablet by mouth daily.         . nitroGLYCERIN (NITROSTAT) 0.4 MG SL tablet   Sublingual   Place 0.4 mg under the tongue every 5 (five) minutes as needed for chest pain. Pt hasn't had to use rx         . oxyCODONE (ROXICODONE) 5 MG/5ML solution   Oral   Take 5 mLs (5 mg total) by mouth every 6 (six) hours as needed for severe pain.   50 mL   0    BP 140/98  Pulse 88  Temp(Src) 97.9 F (36.6 C) (Oral)  Resp 16  SpO2 100% Physical Exam  Vitals reviewed. Constitutional: He appears well-developed and well-nourished.  HENT:  Head: Normocephalic.  Eyes: Pupils  are equal, round, and reactive to light.  Cardiovascular: Normal rate and regular rhythm.   Pulmonary/Chest: Effort normal.  Musculoskeletal: He exhibits tenderness. He exhibits no edema.       Left hip: He exhibits tenderness. He exhibits normal range of motion, normal strength, no swelling and no crepitus.       Left knee: He exhibits normal range of motion and normal alignment. Tenderness found. Medial joint line and lateral joint line tenderness noted.       Legs: Neurological: He is alert.  Skin: Skin is warm.    ED Course  Procedures (including critical care time) Labs Review Labs Reviewed - No data to display Imaging Review No results found.  EKG Interpretation   None       MDM   1. Knee injury, left, initial encounter   2. Hip pain, acute, left   3. Fall, initial encounter         Arman Filter, NP 09/08/13 [phone]

## 2013-09-08 NOTE — ED Provider Notes (Signed)
Medical screening examination/treatment/procedure(s) were performed by non-physician practitioner and as supervising physician I was immediately available for consultation/collaboration.  EKG Interpretation   None        Shon Baton, MD 09/08/13 [phone]

## 2013-09-09 MED ORDER — OXYCODONE HCL 5 MG PO TABS: 5.0000 mg | ORAL_TABLET | Freq: Four times a day (QID) | ORAL | Status: DC | PRN

## 2013-09-09 NOTE — Progress Notes (Signed)
09/09/2013 A. Bethene Hankinson [facility] 1610RU Patient in ED lobby waiting for prescription.  Patient presented his ID to [facility], ID confirmed.  Patient received his printed RX at this time.  Patient thankful for services.  No further CM needs at this time.

## 2013-09-09 NOTE — Progress Notes (Signed)
   CARE MANAGEMENT ED NOTE 09/09/2013  Patient:  Kenneth [name], Kenneth [name]   Account Number:  [id number]  Date Initiated:  09/09/2013  Documentation initiated by:  Radford Pax  Subjective/Objective Assessment:   Patient presented yesterday to [facility] ED post fall with pain to left knee and left hip.     Subjective/Objective Assessment Detail:     Action/Plan:   Action/Plan Detail:   Anticipated DC Date:       Status Recommendation to Physician:   Result of Recommendation:    Other ED Services  Consult Working Plan    DC Planning Services  CM consult  Other  Medication Assistance    Choice offered to / List presented to:            Status of service:  Completed, signed off  ED Comments:   ED Comments Detail:  [facility] consulted for medication assistance.  Patient received prescription for oxycodone solution.  Patient [facility] to [facility] [address] who reported they dis not have the liquid form of oxycodone but would be able to provide him with the tablet form.  This EDCM called this patient at [phone].  As per patient, [facility] [address] and he does not have any difficulty swallowing medications.  EDCM spoke to Dr. Toy Cookey who changed RX to oxycodone tablet 5mg  every six hours as needed for pain, no refills fifteen tablets.  [facility] who reported that [facility] could not call in the RX because it is a narcotic and could not fax it over either.  Dr. Micheline Maze agreed to write out RX and patient is agreeable to come and pick RX up from ED.  No further CM needs at this time.

## 2013-09-13 ENCOUNTER — Inpatient Hospital Stay (HOSPITAL_COMMUNITY)
Admission: EM | Admit: 2013-09-13 | Discharge: 2013-09-14 | DRG: 193 | Disposition: A | Discharge status: Home / Self Care | Payer: Medicare Other | Attending: Internal Medicine | Admitting: Internal Medicine

## 2013-09-13 ENCOUNTER — Other Ambulatory Visit: Payer: Self-pay

## 2013-09-13 ENCOUNTER — Emergency Department (HOSPITAL_COMMUNITY): Payer: Medicare Other

## 2013-09-13 ENCOUNTER — Encounter (HOSPITAL_COMMUNITY): Payer: Self-pay | Admitting: Emergency Medicine

## 2013-09-13 DIAGNOSIS — Z21 Asymptomatic human immunodeficiency virus [HIV] infection status: Secondary | ICD-10-CM

## 2013-09-13 DIAGNOSIS — R0902 Hypoxemia: Secondary | ICD-10-CM

## 2013-09-13 DIAGNOSIS — F1410 Cocaine abuse, uncomplicated: Secondary | ICD-10-CM | POA: Diagnosis present

## 2013-09-13 DIAGNOSIS — G8929 Other chronic pain: Secondary | ICD-10-CM | POA: Diagnosis present

## 2013-09-13 DIAGNOSIS — J1100 Influenza due to unidentified influenza virus with unspecified type of pneumonia: Principal | ICD-10-CM | POA: Diagnosis present

## 2013-09-13 DIAGNOSIS — J09X2 Influenza due to identified novel influenza A virus with other respiratory manifestations: Secondary | ICD-10-CM

## 2013-09-13 DIAGNOSIS — I10 Essential (primary) hypertension: Secondary | ICD-10-CM | POA: Diagnosis present

## 2013-09-13 DIAGNOSIS — B20 Human immunodeficiency virus [HIV] disease: Secondary | ICD-10-CM

## 2013-09-13 DIAGNOSIS — J189 Pneumonia, unspecified organism: Secondary | ICD-10-CM

## 2013-09-13 HISTORY — DX: Asymptomatic human immunodeficiency virus (hiv) infection status: Z21

## 2013-09-13 HISTORY — DX: Human immunodeficiency virus (HIV) disease: B20

## 2013-09-13 LAB — CBC WITH DIFFERENTIAL/PLATELET
Basophils Absolute: 0 10*3/uL (ref 0.0–0.1)
Basophils Relative: 0 % (ref 0–1)
Eosinophils Absolute: 0 10*3/uL (ref 0.0–0.7)
Eosinophils Relative: 0 % (ref 0–5)
HCT: 43.3 % (ref 39.0–52.0)
Hemoglobin: 14.5 g/dL (ref 13.0–17.0)
Lymphocytes Relative: 13 % (ref 12–46)
Lymphs Abs: 1 10*3/uL (ref 0.7–4.0)
MCH: 27.5 pg (ref 26.0–34.0)
MCHC: 33.5 g/dL (ref 30.0–36.0)
MCV: 82.2 fL (ref 78.0–100.0)
Monocytes Absolute: 0.5 10*3/uL (ref 0.1–1.0)
Monocytes Relative: 7 % (ref 3–12)
Neutro Abs: 6 10*3/uL (ref 1.7–7.7)
Neutrophils Relative %: 80 % — ABNORMAL HIGH (ref 43–77)
Platelets: 320 10*3/uL (ref 150–400)
RBC: 5.27 MIL/uL (ref 4.22–5.81)
RDW: 16.2 % — ABNORMAL HIGH (ref 11.5–15.5)
WBC: 7.5 10*3/uL (ref 4.0–10.5)

## 2013-09-13 LAB — COMPREHENSIVE METABOLIC PANEL
ALT: 9 U/L (ref 0–53)
AST: 14 U/L (ref 0–37)
Albumin: 3.4 g/dL — ABNORMAL LOW (ref 3.5–5.2)
Alkaline Phosphatase: 116 U/L (ref 39–117)
BUN: 20 mg/dL (ref 6–23)
CO2: 27 mEq/L (ref 19–32)
Calcium: 9.1 mg/dL (ref 8.4–10.5)
Chloride: 102 mEq/L (ref 96–112)
Creatinine, Ser: 1.2 mg/dL (ref 0.50–1.35)
GFR calc Af Amer: 75 mL/min — ABNORMAL LOW (ref >90)
GFR calc non Af Amer: 65 mL/min — ABNORMAL LOW (ref >90)
Glucose, Bld: 94 mg/dL (ref 70–99)
Potassium: 4.1 mEq/L (ref 3.5–5.1)
Sodium: 136 mEq/L (ref 135–145)
Total Bilirubin: 0.3 mg/dL (ref 0.3–1.2)
Total Protein: 8.9 g/dL — ABNORMAL HIGH (ref 6.0–8.3)

## 2013-09-13 LAB — RAPID URINE DRUG SCREEN, HOSP PERFORMED
Amphetamines: NOT DETECTED
Barbiturates: NOT DETECTED
Benzodiazepines: NOT DETECTED
Cocaine: POSITIVE — AB
Opiates: POSITIVE — AB
Tetrahydrocannabinol: NOT DETECTED

## 2013-09-13 LAB — URINALYSIS W MICROSCOPIC + REFLEX CULTURE
Glucose, UA: NEGATIVE mg/dL
Hgb urine dipstick: NEGATIVE
Ketones, ur: 15 mg/dL — AB
Leukocytes, UA: NEGATIVE
Nitrite: NEGATIVE
Protein, ur: NEGATIVE mg/dL
Specific Gravity, Urine: 1.035 — ABNORMAL HIGH (ref 1.005–1.030)
Urobilinogen, UA: 1 mg/dL (ref 0.0–1.0)
pH: 6 (ref 5.0–8.0)

## 2013-09-13 LAB — TROPONIN I
Troponin I: <0.3 ng/mL (ref <0.30)
Troponin I: <0.3 ng/mL (ref <0.30)

## 2013-09-13 MED ORDER — LEVOFLOXACIN IN D5W 750 MG/150ML IV SOLN
750.0000 mg | Freq: Once | INTRAVENOUS | Status: AC
  Administered 2013-09-13: 750 mg via INTRAVENOUS
  Filled 2013-09-13: qty 150

## 2013-09-13 MED ORDER — HEPARIN SODIUM (PORCINE) 5000 UNIT/ML IJ SOLN
5000.0000 [IU] | Freq: Three times a day (TID) | INTRAMUSCULAR | Status: DC
  Administered 2013-09-13 – 2013-09-14 (×4): 5000 [IU] via SUBCUTANEOUS
  Filled 2013-09-13 (×5): qty 1

## 2013-09-13 MED ORDER — ACETAMINOPHEN 325 MG PO TABS
650.0000 mg | ORAL_TABLET | Freq: Once | ORAL | Status: AC
  Administered 2013-09-13: 650 mg via ORAL
  Filled 2013-09-13: qty 2

## 2013-09-13 MED ORDER — SODIUM CHLORIDE 0.9 % IV BOLUS (SEPSIS)
500.0000 mL | Freq: Once | INTRAVENOUS | Status: AC
  Administered 2013-09-13: 500 mL via INTRAVENOUS

## 2013-09-13 MED ORDER — AMLODIPINE BESYLATE 5 MG PO TABS
5.0000 mg | ORAL_TABLET | Freq: Every day | ORAL | Status: DC
  Administered 2013-09-13 – 2013-09-14 (×2): 5 mg via ORAL
  Filled 2013-09-13 (×2): qty 1

## 2013-09-13 MED ORDER — ELVITEG-COBIC-EMTRICIT-TENOFDF 150-150-200-300 MG PO TABS
1.0000 | ORAL_TABLET | Freq: Every day | ORAL | Status: DC
  Administered 2013-09-14: 1 via ORAL
  Filled 2013-09-13 (×2): qty 1

## 2013-09-13 MED ORDER — SALINE SPRAY 0.65 % NA SOLN
1.0000 | NASAL | Status: DC | PRN
  Filled 2013-09-13: qty 44

## 2013-09-13 MED ORDER — ASPIRIN 81 MG PO CHEW
81.0000 mg | CHEWABLE_TABLET | Freq: Every day | ORAL | Status: DC
  Administered 2013-09-13 – 2013-09-14 (×2): 81 mg via ORAL
  Filled 2013-09-13 (×2): qty 1

## 2013-09-13 MED ORDER — ONDANSETRON HCL 4 MG/2ML IJ SOLN: 4.0000 mg | Freq: Four times a day (QID) | INTRAMUSCULAR | Status: DC | PRN

## 2013-09-13 MED ORDER — OSELTAMIVIR PHOSPHATE 75 MG PO CAPS
75.0000 mg | ORAL_CAPSULE | Freq: Every day | ORAL | Status: DC
  Administered 2013-09-13 – 2013-09-14 (×2): 75 mg via ORAL
  Filled 2013-09-13 (×2): qty 1

## 2013-09-13 MED ORDER — MORPHINE SULFATE 2 MG/ML IJ SOLN
2.0000 mg | Freq: Once | INTRAMUSCULAR | Status: AC
  Administered 2013-09-13: 2 mg via INTRAVENOUS
  Filled 2013-09-13: qty 1

## 2013-09-13 MED ORDER — SODIUM CHLORIDE 0.9 % IJ SOLN
3.0000 mL | Freq: Two times a day (BID) | INTRAMUSCULAR | Status: DC
  Administered 2013-09-13 – 2013-09-14 (×2): 3 mL via INTRAVENOUS

## 2013-09-13 MED ORDER — ONDANSETRON HCL 4 MG PO TABS: 4.0000 mg | ORAL_TABLET | Freq: Four times a day (QID) | ORAL | Status: DC | PRN

## 2013-09-13 MED ORDER — MORPHINE SULFATE 2 MG/ML IJ SOLN
2.0000 mg | INTRAMUSCULAR | Status: DC | PRN
  Administered 2013-09-13 – 2013-09-14 (×5): 2 mg via INTRAVENOUS
  Filled 2013-09-13 (×5): qty 1

## 2013-09-13 NOTE — ED Notes (Signed)
Pt. Stated, Im sore all over and i have a cough.  i ran a fever last night.

## 2013-09-13 NOTE — ED Notes (Signed)
Pt had some difficulty ambulating. Pt. Stated that he felt dizzy. His oxygen saturation kept dropping to appx 81%. After stopping and taking deep breaths in, they raised back to normal limits. Pt stated that his knees were hurting him. Pt stated that he usually does not have difficulty walking. His blood pressure and pulse were within normal limits during ambulation.

## 2013-09-13 NOTE — H&P (Signed)
Date: 09/13/2013               Patient Name:  Kenneth [name] MRN: [id number]  DOB: 1955/11/28 Age / Sex: 57 y.o., male   PCP: Otis Brace, MD         Medical Service: Internal Medicine Teaching Service         Attending Physician: Dr. Burns Spain, MD    First Contact: Dr. Darci Needle Pager: [phone]  Second Contact: Dr. Virgina Organ Pager: [phone]       After Hours (After 5p/  First Contact Pager: [phone]  weekends / holidays): Second Contact Pager: [phone]   Chief Complaint: shortness of breath  History of Present Illness:  Kenneth [name] is a 57 year old man with PMH of HIV (CD4 740, viral load 557 in 03/2013), HTN, HL, ?CAD, chronic bronchitis who presents with shortness of breath, cough, fever, myalgias.   Patient states that yesterday 12/26 he was watching TV when he noticed that his hands were aching.  He also started having nonproductive cough and shortness of breath, headache, fever not long after.  He reports that his temperature was 101F last night when his wife checked it; he took Tylenol which brought his fever down but did not resolve his myalgias.  No difficulty moving his neck.  No sick contacts.  Denies smoking, EtOH, drugs.  On ROS, reports vomiting x 2 this morning, NBNB.   Meds: Current Facility-Administered Medications  Medication Dose Route Frequency Provider Last Rate Last Dose  . levofloxacin (LEVAQUIN) IVPB 750 mg  750 mg Intravenous Once Laray Anger, DO       Current Outpatient Prescriptions  Medication Sig Dispense Refill  . acetaminophen (TYLENOL) 500 MG tablet Take 1 tablet (500 mg total) by mouth every 6 (six) hours as needed.  30 tablet  0  . amLODipine (NORVASC) 5 MG tablet Take 1 tablet (5 mg total) by mouth daily.  30 tablet  10  . aspirin 81 MG chewable tablet Chew 81 mg by mouth daily.       Marland Kitchen elvitegravir-cobicistat-emtricitabine-tenofovir (STRIBILD) 150-150-200-300 MG TABS tablet Take 1 tablet by mouth daily with breakfast.  30 tablet  11   . EPINEPHrine (EPI-PEN) 0.3 mg/0.3 mL SOAJ Inject 0.3 mg into the muscle once as needed (Anaphylaxis).       . Multiple Vitamin (MULTIVITAMIN WITH MINERALS) TABS Take 1 tablet by mouth daily.      . nitroGLYCERIN (NITROSTAT) 0.4 MG SL tablet Place 0.4 mg under the tongue every 5 (five) minutes as needed for chest pain. Pt hasn't had to use rx      . oxyCODONE (ROXICODONE) 5 MG immediate release tablet Take 1 tablet (5 mg total) by mouth every 6 (six) hours as needed for severe pain.  15 tablet  0    Allergies: Allergies as of 09/13/2013 - Review Complete 09/13/2013  Allergen Reaction Noted  . Bee venom Anaphylaxis 09/18/2012  . Flexeril [cyclobenzaprine hcl] Anaphylaxis, Shortness Of Breath, and Rash 10/28/2011  . Hydrocodone Itching 09/13/2012  . Nabumetone Shortness Of Breath, Itching, and Other (See Comments) 09/18/2012  . [name] [naproxen] Shortness Of Breath, Nausea And Vomiting, and Rash 10/28/2011  . Tramadol Shortness Of Breath and Rash 10/28/2011  . Ace inhibitors Itching 01/31/2013  . Other Itching 09/18/2012   Past Medical History  Diagnosis Date  . HIV positive 2004  . Hypertension   . Meningitis   . Coronary artery disease   . Chest pain at rest 09/23/2012  .  Chronic bronchitis     "q year last 5 yr or so" (09/24/2012)  . Exertional dyspnea   . Migraines   . Arthritis     "both shoulders" (09/24/2012)  . Chronic lower back pain   . Pneumonia   . Lumbar disc disease   . Iliac artery aneurysm     Right   Past Surgical History  Procedure Laterality Date  . Intussusception repair  10/2011  . Posterior lumbar fusion  1995  . Elbow surgery  ~ 1997    "removed some stones; right" (09/24/2012)  . Appendectomy  2013  . Back surgery  2006    4 BACK SURGERIES  . Abdominal surgery    . Spine surgery      Injury to back 1995   Family History  Problem Relation Age of Onset  . Heart disease Father   . Heart disease Brother   . Diabetes Brother    History   Social  History  . Marital Status: Married    Spouse Name: N/A    Number of Children: N/A  . Years of Education: N/A   Occupational History  . Not on file.   Social History Main Topics  . Smoking status: Former Smoker -- 0.10 packs/day for 45 years    Types: Cigarettes    Quit date: 09/24/2012  . Smokeless tobacco: Never Used     Comment: 09/24/2012 "been stopped now ~ 8 month".  3 per day now.  . Alcohol Use: No  . Drug Use: Yes    Special: Cocaine     Comment: last use 09/24/2012  . Sexual Activity: Not on file     Comment: declined condoms   Other Topics Concern  . Not on file   Social History Narrative  . No narrative on file    Review of Systems: Review of Systems  Constitutional: Positive for fever and malaise/fatigue. Negative for weight loss.  Eyes: Negative for blurred vision.  Respiratory: Positive for cough and shortness of breath. Negative for hemoptysis and sputum production.   Cardiovascular: Negative for chest pain and leg swelling.  Gastrointestinal: Negative for nausea, vomiting, abdominal pain, diarrhea, constipation, blood in stool and melena.  Genitourinary: Negative for dysuria.  Musculoskeletal: Positive for joint pain and myalgias. Negative for falls and neck pain.  Neurological: Positive for headaches. Negative for dizziness, tingling and seizures.    Physical Exam: Blood pressure 126/87, pulse 83, temperature 98.4 F (36.9 C), temperature source Oral, resp. rate 16, height 5' 8.9" (1.75 m), weight 155 lb 1 oz (70.336 kg), SpO2 97.00%. General: alert, cooperative, NAD but tearful on exam "because he is hurting" HEENT: NCAT, vision grossly intact, oropharynx clear and non-erythematous  Neck: supple, no lymphadenopathy Lungs: crackles heard in left lung base, otherwise clear, normal work of respiration, no wheezes or ronchi Heart: regular rate and rhythm, no murmurs, gallops, or rubs Abdomen: soft, non-tender, non-distended, normal bowel sounds Extremities:  2+ DP/PT pulses bilaterally, no cyanosis, clubbing, or edema Neurologic: alert & oriented X3, cranial nerves II-XII intact, strength grossly intact, sensation intact to light touch   Lab results: Basic Metabolic Panel:  Recent Labs  16/10/96 1159  NA 136  K 4.1  CL 102  CO2 27  GLUCOSE 94  BUN 20  CREATININE 1.20  CALCIUM 9.1   Liver Function Tests:  Recent Labs  09/13/13 1159  AST 14  ALT 9  ALKPHOS 116  BILITOT 0.3  PROT 8.9*  ALBUMIN 3.4*   CBC:  Recent Labs  09/13/13 1159  WBC 7.5  NEUTROABS 6.0  HGB 14.5  HCT 43.3  MCV 82.2  PLT 320   Cardiac Enzymes:  Recent Labs  09/13/13 1400  TROPONINI <0.30   Urinalysis:  Recent Labs  09/13/13 1235  COLORURINE YELLOW  LABSPEC 1.035*  PHURINE 6.0  GLUCOSEU NEGATIVE  HGBUR NEGATIVE  BILIRUBINUR SMALL*  KETONESUR 15*  PROTEINUR NEGATIVE  UROBILINOGEN 1.0  NITRITE NEGATIVE  LEUKOCYTESUR NEGATIVE    Imaging results:  Dg Chest 2 View  09/13/2013   CLINICAL DATA:  Body aches and chills and chest pain.  EXAM: CHEST  2 VIEW  COMPARISON:  August 15, 2013.  FINDINGS: The lungs are hyper inflated. There is increased density in the left infrahilar region consistent with atelectasis or developing pneumonia. These findings are more conspicuous on today's study than on the previous exam there is no pleural effusion. The cardiopericardial silhouette is normal in size. The mediastinum is normal in width. There is no pleural effusion. The observed portions of the bony thorax appear normal  IMPRESSION: Increase conspicuity of interstitial density at the left lung base is consistent with atelectasis and/or pneumonia. This is superimposed upon findings of COPD. Followup films following therapy are recommended to assure clearing.   Electronically Signed   By: David  [place]   On: 09/13/2013 13:12   Other results: EKG: pending; new T wave inversions seen on ED EKG  Assessment & Plan by Problem: #CAP +/- influenza-  Patient reports symtoms of nonproductive cough, shortness of breath, fever, headache, myalgias since 12/26.  Afebrile in ED and with no tachycardia, tachypnea, or leucocytosis (though HIV patient and neutrophilic predominance) thus patient does not meet any SIRS criteria.  CXR showed interstitial density at left lung base consistent with atelectasis and/or PNA (superimposed on findings of COPD). Will need repeat CXR in 6 weeks.  According to ED, he desatted to 80s with ambulation.  He did get a flu shot this year.   -admit to med surg -blood cultures x 2 pending (collected prior to initiation of abx)  -Influenza panel pending  -Droplet isolation until flu panel returns -Tamiflu 30 mg daily until flu panel returns -start levofloxacin 750 mg IV; pharmacy to renally dose abx -morphine 2 mg q3h prn pain (note: patient has allergies listed to Norco, tramadol, and naproxen) -additional troponin pending -EKG in AM -BMP, CBC in AM  #HTN- normotensive now, BPs 120s/80s. On amlodipine 5 mg daily with good compliance.  -continue amlodipine 5 mg daily  #HIV- CD4 740, viral load 557 in 03/2013.  Patient follows with Dr. Orvan Falconer.  He reports excellent compliance with his medication.   -continue Stribild 150-150-200-300 mg daily  #DVT PPX- heparin subq TID  #Code status- Full code   Dispo: Disposition is deferred at this time, awaiting improvement of current medical problems. Anticipated discharge in approximately 2-3 day(s).   The patient does have a current PCP (Otis Brace, MD) and does need an [facility] hospital follow-up appointment after discharge.  Signed: Rocco Serene, MD 09/13/2013, 3:25 PM

## 2013-09-13 NOTE — ED Provider Notes (Signed)
Medical screening examination/treatment/procedure(s) were conducted as a shared visit with non-physician practitioner(s) and myself.  I personally evaluated the patient during the encounter.  57yo M, c/o SOB, cough and generalized body aches since yesterday. Has been associated with N/V and home fevers to "101" yesterday. Signif hx HIV+, last CD4 count 03/2013 was 740. VSS, uncomfortable appearing, lungs coarse, RRR, abd soft/NT, neuro non-focal. CXR with pneumonia.   1515:  Pt ambulated with Sats dropping to 81% R/A, increasing RR and pt c/o increasing SOB. Influenza panel ordered. Dx and testing d/w pt and family.  Questions answered.  Verb understanding, agreeable to admit. T/C to [facility] Resident, case discussed, including:  HPI, pertinent PM/SHx, VS/PE, dx testing, ED course and treatment:  Agreeable to admit, requests to obtain [facility] x2, start CAP abx, and they will come to ED for eval.     Date: 09/13/2013  Rate: 78  Rhythm: normal sinus rhythm  QRS Axis: left  Intervals: normal  ST/T Wave abnormalities: nonspecific ST/T changes, TWI anterior and inferior leads  Conduction Disutrbances:none  Narrative Interpretation:   Old EKG Reviewed: changes noted; TWA anterior and inferior leads new since previous EKG 08/15/2013.  Results for orders placed during the hospital encounter of 09/13/13  CBC WITH DIFFERENTIAL      Result Value Range   WBC 7.5  4.0 - 10.5 K/uL   RBC 5.27  4.22 - 5.81 MIL/uL   Hemoglobin 14.5  13.0 - 17.0 g/dL   HCT 16.1  09.6 - 04.5 %   MCV 82.2  78.0 - 100.0 fL   MCH 27.5  26.0 - 34.0 pg   MCHC 33.5  30.0 - 36.0 g/dL   RDW 40.9 (*) 81.1 - 91.4 %   Platelets 320  150 - 400 K/uL   Neutrophils Relative % 80 (*) 43 - 77 %   Neutro Abs 6.0  1.7 - 7.7 K/uL   Lymphocytes Relative 13  12 - 46 %   Lymphs Abs 1.0  0.7 - 4.0 K/uL   Monocytes Relative 7  3 - 12 %   Monocytes Absolute 0.5  0.1 - 1.0 K/uL   Eosinophils Relative 0  0 - 5 %   Eosinophils Absolute 0.0  0.0 - 0.7  K/uL   Basophils Relative 0  0 - 1 %   Basophils Absolute 0.0  0.0 - 0.1 K/uL  COMPREHENSIVE METABOLIC PANEL      Result Value Range   Sodium 136  135 - 145 mEq/L   Potassium 4.1  3.5 - 5.1 mEq/L   Chloride 102  96 - 112 mEq/L   CO2 27  19 - 32 mEq/L   Glucose, Bld 94  70 - 99 mg/dL   BUN 20  6 - 23 mg/dL   Creatinine, Ser 7.82  0.50 - 1.35 mg/dL   Calcium 9.1  8.4 - 95.6 mg/dL   Total Protein 8.9 (*) 6.0 - 8.3 g/dL   Albumin 3.4 (*) 3.5 - 5.2 g/dL   AST 14  0 - 37 U/L   ALT 9  0 - 53 U/L   Alkaline Phosphatase 116  39 - 117 U/L   Total Bilirubin 0.3  0.3 - 1.2 mg/dL   GFR calc non Af Amer 65 (*) >90 mL/min   GFR calc Af Amer 75 (*) >90 mL/min  URINALYSIS W MICROSCOPIC + REFLEX CULTURE      Result Value Range   Color, Urine YELLOW  YELLOW   APPearance CLEAR  CLEAR  Specific Gravity, Urine 1.035 (*) 1.005 - 1.030   pH 6.0  5.0 - 8.0   Glucose, UA NEGATIVE  NEGATIVE mg/dL   Hgb urine dipstick NEGATIVE  NEGATIVE   Bilirubin Urine SMALL (*) NEGATIVE   Ketones, ur 15 (*) NEGATIVE mg/dL   Protein, ur NEGATIVE  NEGATIVE mg/dL   Urobilinogen, UA 1.0  0.0 - 1.0 mg/dL   Nitrite NEGATIVE  NEGATIVE   Leukocytes, UA NEGATIVE  NEGATIVE   WBC, UA 0-2  <3 WBC/hpf   RBC / HPF 0-2  <3 RBC/hpf   Bacteria, UA RARE  RARE   Squamous Epithelial / LPF FEW (*) RARE   Urine-Other MUCOUS PRESENT    TROPONIN I      Result Value Range   Troponin I <0.30  <0.30 ng/mL   Dg Chest 2 View 09/13/2013   CLINICAL DATA:  Body aches and chills and chest pain.  EXAM: CHEST  2 VIEW  COMPARISON:  August 15, 2013.  FINDINGS: The lungs are hyper inflated. There is increased density in the left infrahilar region consistent with atelectasis or developing pneumonia. These findings are more conspicuous on today's study than on the previous exam there is no pleural effusion. The cardiopericardial silhouette is normal in size. The mediastinum is normal in width. There is no pleural effusion. The observed portions of  the bony thorax appear normal  IMPRESSION: Increase conspicuity of interstitial density at the left lung base is consistent with atelectasis and/or pneumonia. This is superimposed upon findings of COPD. Followup films following therapy are recommended to assure clearing.   Electronically Signed   By: David  [place]   On: 09/13/2013 13:12         Laray Anger, DO 09/17/13 1333

## 2013-09-13 NOTE — ED Provider Notes (Signed)
CSN: [id number]     Arrival date & time 09/13/13  1128 History   First MD Initiated Contact with Patient 09/13/13 1308     Chief Complaint  Patient presents with  . Generalized Body Aches   (Consider location/radiation/quality/duration/timing/severity/associated sxs/prior Treatment) HPI [name] presents with cough and body aches that started yesterday. Patient admits to productive cough with green sputum. Cough started first and is worsened with laying down. Admits to associated chest pain and shortness of breath with cough. CP described as pleuritic in nature and radiates to center of neck. Denies jaw pain. Patient states shortness of breath worsened with activity.  Patient admits to fever/chills. Admits to N/V x 3 episodes yesterday without hx of hematemesis. Patient states he follows with Dr. Orvan Falconer for infectious disease.  Last CD4 count 740 on 03/25/13.    PMH significant for 60 pack yr smoker, HIV, HTN, CAD, Chronic Bronchitis, Exertional dyspnea, Pneumonia. Patient also admits to recent cocaine use, last used 5 days ago via inhalation. Denies any other drug use.  Past Medical History  Diagnosis Date  . HIV positive 2004  . Hypertension   . Meningitis   . Coronary artery disease   . Chest pain at rest 09/23/2012  . Chronic bronchitis     "q year last 5 yr or so" (09/24/2012)  . Exertional dyspnea   . Migraines   . Arthritis     "both shoulders" (09/24/2012)  . Chronic lower back pain   . Pneumonia   . Lumbar disc disease   . Iliac artery aneurysm     Right   Past Surgical History  Procedure Laterality Date  . Intussusception repair  10/2011  . Posterior lumbar fusion  1995  . Elbow surgery  ~ 1997    "removed some stones; right" (09/24/2012)  . Appendectomy  2013  . Back surgery  2006    4 BACK SURGERIES  . Abdominal surgery    . Spine surgery      Injury to back 1995   Family History  Problem Relation Age of Onset  . Heart disease Father   . Heart disease  Brother   . Diabetes Brother    History  Substance Use Topics  . Smoking status: Former Smoker -- 0.10 packs/day for 45 years    Types: Cigarettes    Quit date: 09/24/2012  . Smokeless tobacco: Never Used     Comment: 09/24/2012 "been stopped now ~ 8 month".  3 per day now.  . Alcohol Use: No    Review of Systems  Gastrointestinal: Negative for abdominal pain, diarrhea, constipation and blood in stool.  Genitourinary: Negative for dysuria, hematuria, flank pain and difficulty urinating.  Musculoskeletal: Positive for arthralgias and myalgias. Negative for neck pain and neck stiffness.  Skin: Negative for rash.  Neurological: Positive for weakness and headaches.  All other systems reviewed and are negative.    Allergies  Bee venom; Flexeril; Hydrocodone; Nabumetone; [name]; Tramadol; Ace inhibitors; and Other  Home Medications   No current outpatient prescriptions on file. BP 105/64  Pulse 83  Temp(Src) 99.4 F (37.4 C) (Oral)  Resp 18  Ht 5' 8.9" (1.75 m)  Wt 155 lb 1 oz (70.336 kg)  BMI 22.97 kg/m2  SpO2 92% Physical Exam  Nursing note and vitals reviewed. Constitutional: He is oriented to person, place, and time. He appears well-developed and well-nourished. No distress.  HENT:  Head: Normocephalic and atraumatic.  Cardiovascular: Normal rate and regular rhythm.  Exam reveals no gallop and no friction rub.   No murmur heard. Pulmonary/Chest: Effort normal and breath sounds normal. No respiratory distress. He has no wheezes. He has no rales.  Musculoskeletal: Normal range of motion. He exhibits no edema.  Neurological: He is alert and oriented to person, place, and time.  Skin: Skin is warm and dry. He is not diaphoretic.  Psychiatric: He has a normal mood and affect. His behavior is normal.    ED Course  Procedures (including critical care time) Labs Review Labs Reviewed  CBC WITH DIFFERENTIAL - Abnormal; Notable for the following:    RDW 16.2 (*)     Neutrophils Relative % 80 (*)    All other components within normal limits  COMPREHENSIVE METABOLIC PANEL - Abnormal; Notable for the following:    Total Protein 8.9 (*)    Albumin 3.4 (*)    GFR calc non Af Amer 65 (*)    GFR calc Af Amer 75 (*)    All other components within normal limits  URINALYSIS W MICROSCOPIC + REFLEX CULTURE - Abnormal; Notable for the following:    Specific Gravity, Urine 1.035 (*)    Bilirubin Urine SMALL (*)    Ketones, ur 15 (*)    Squamous Epithelial / LPF FEW (*)    All other components within normal limits  URINE RAPID DRUG SCREEN (HOSP PERFORMED) - Abnormal; Notable for the following:    Opiates POSITIVE (*)    Cocaine POSITIVE (*)    All other components within normal limits  CULTURE, BLOOD (ROUTINE X 2)  CULTURE, BLOOD (ROUTINE X 2)  TROPONIN I  TROPONIN I  INFLUENZA PANEL BY PCR  BASIC METABOLIC PANEL  CBC  TROPONIN I  TROPONIN I   Imaging Review Dg Chest 2 View  09/13/2013   CLINICAL DATA:  Body aches and chills and chest pain.  EXAM: CHEST  2 VIEW  COMPARISON:  August 15, 2013.  FINDINGS: The lungs are hyper inflated. There is increased density in the left infrahilar region consistent with atelectasis or developing pneumonia. These findings are more conspicuous on today's study than on the previous exam there is no pleural effusion. The cardiopericardial silhouette is normal in size. The mediastinum is normal in width. There is no pleural effusion. The observed portions of the bony thorax appear normal  IMPRESSION: Increase conspicuity of interstitial density at the left lung base is consistent with atelectasis and/or pneumonia. This is superimposed upon findings of COPD. Followup films following therapy are recommended to assure clearing.   Electronically Signed   By: David  [place]   On: 09/13/2013 13:12    EKG Interpretation    Date/Time:    Ventricular Rate:    PR Interval:    QRS Duration:   QT Interval:    QTC Calculation:   R  Axis:     Text Interpretation:              Date: 09/13/2013  Rate: 78  Rhythm: normal sinus rhythm  QRS Axis: left  Intervals: normal  ST/T Wave abnormalities: nonspecific T wave changes  Conduction Disutrbances:none  Narrative Interpretation: No STEMI  Old EKG Reviewed: changes noted   MDM   1. CAP (community acquired pneumonia)   2. HIV positive   3. Hypoxia    CXR consistent with pneumonia. EKG abnormal and changed from previous study, no STEMI noted. Patient O2 sat drops with ambulation. Patient discussed with Dr. Samuel Jester.   Plan to admit patient.  Rudene Anda, PA-C 09/14/13 [phone]

## 2013-09-14 LAB — BASIC METABOLIC PANEL
BUN: 14 mg/dL (ref 6–23)
CO2: 22 mEq/L (ref 19–32)
Calcium: 8.7 mg/dL (ref 8.4–10.5)
Chloride: 101 mEq/L (ref 96–112)
Creatinine, Ser: 1.02 mg/dL (ref 0.50–1.35)
GFR calc Af Amer: >90 mL/min (ref >90)
GFR calc non Af Amer: 79 mL/min — ABNORMAL LOW (ref >90)
Glucose, Bld: 82 mg/dL (ref 70–99)
Potassium: 3.8 mEq/L (ref 3.5–5.1)
Sodium: 133 mEq/L — ABNORMAL LOW (ref 135–145)

## 2013-09-14 LAB — CBC
HCT: 41.6 % (ref 39.0–52.0)
Hemoglobin: 14.2 g/dL (ref 13.0–17.0)
MCH: 27.8 pg (ref 26.0–34.0)
MCHC: 34.1 g/dL (ref 30.0–36.0)
MCV: 81.6 fL (ref 78.0–100.0)
Platelets: 259 10*3/uL (ref 150–400)
RBC: 5.1 MIL/uL (ref 4.22–5.81)
RDW: 16.2 % — ABNORMAL HIGH (ref 11.5–15.5)
WBC: 6.5 10*3/uL (ref 4.0–10.5)

## 2013-09-14 LAB — TROPONIN I
Troponin I: <0.3 ng/mL (ref <0.30)
Troponin I: <0.3 ng/mL (ref <0.30)

## 2013-09-14 LAB — INFLUENZA PANEL BY PCR (TYPE A & B)
H1N1 flu by pcr: NOT DETECTED
Influenza A By PCR: POSITIVE — AB
Influenza B By PCR: NEGATIVE

## 2013-09-14 MED ORDER — OSELTAMIVIR PHOSPHATE 75 MG PO CAPS: 75.0000 mg | ORAL_CAPSULE | Freq: Two times a day (BID) | ORAL | Status: DC

## 2013-09-14 MED ORDER — MENTHOL 3 MG MT LOZG
1.0000 | LOZENGE | OROMUCOSAL | Status: DC | PRN
  Filled 2013-09-14 (×2): qty 9

## 2013-09-14 MED ORDER — LEVOFLOXACIN 750 MG PO TABS: 750.0000 mg | ORAL_TABLET | Freq: Every day | ORAL | Status: DC

## 2013-09-14 MED ORDER — OSELTAMIVIR PHOSPHATE 75 MG PO CAPS
75.0000 mg | ORAL_CAPSULE | Freq: Two times a day (BID) | ORAL | Status: DC
  Filled 2013-09-14: qty 1

## 2013-09-14 MED ORDER — LEVOFLOXACIN 750 MG PO TABS
750.0000 mg | ORAL_TABLET | Freq: Every day | ORAL | Status: DC
  Administered 2013-09-14: 750 mg via ORAL
  Filled 2013-09-14: qty 1

## 2013-09-14 NOTE — Progress Notes (Signed)
Subjective: Patient feels sweaty and has been coughing quite a bit overnight, productive of yellow/green sputum at times. He has persistent HA for which he has been receiving morphine, though this does not seem to be helping. Pt does believe his body aches have improved. He has ST and would like throat lozenges. No N/V/D, abd pain, SOB, CP.  Objective: Vital signs in last 24 hours: Filed Vitals:   09/13/13 1645 09/13/13 1758 09/13/13 2154 09/14/13 0510  BP: 109/68 113/65 105/64 120/76  Pulse: 78 72 83 54  Temp:  98 F (36.7 C) 99.4 F (37.4 C) 99.7 F (37.6 C)  TempSrc:  Oral Oral   Resp: 21 20 18 20   Height:      Weight:      SpO2: 92% 100% 92% 98%   Weight change:   Intake/Output Summary (Last 24 hours) at 09/14/13 0920 Last data filed at 09/14/13 0512  Gross per 24 hour  Intake      0 ml  Output      4 ml  Net     -4 ml   Physical exam: General: alert, cooperative, NAD, wearing mask HEENT: NCAT, vision grossly intact, oropharynx clear and non-erythematous  Neck: supple Lungs: crackles heard in left lung base, otherwise clear, normal work of respiration, no wheezes or rhonchi; +cough Heart: regular rate and rhythm, no murmurs, gallops, or rubs Abdomen: soft, non-tender, non-distended, normal bowel sounds  Extremities: warm extremities, no BLE edema Neurologic: alert & oriented X3, cranial nerves II-XII grossly intact, strength grossly intact, sensation intact to light touch; negative kernig and brudzinski signs  Lab Results: Basic Metabolic Panel:  Recent Labs Lab 09/13/13 1159 09/14/13 0615  NA 136 133*  K 4.1 3.8  CL 102 101  CO2 27 22  GLUCOSE 94 82  BUN 20 14  CREATININE 1.20 1.02  CALCIUM 9.1 8.7   Liver Function Tests:  Recent Labs Lab 09/13/13 1159  AST 14  ALT 9  ALKPHOS 116  BILITOT 0.3  PROT 8.9*  ALBUMIN 3.4*   CBC:  Recent Labs Lab 09/13/13 1159 09/14/13 0615  WBC 7.5 6.5  NEUTROABS 6.0  --   HGB 14.5 14.2  HCT 43.3 41.6    MCV 82.2 81.6  PLT 320 259   Cardiac Enzymes:  Recent Labs Lab 09/13/13 1830 09/14/13 0020 09/14/13 0615  TROPONINI <0.30 <0.30 <0.30   Urine Drug Screen: Drugs of Abuse     Component Value Date/Time   LABOPIA POSITIVE* 09/13/2013 2010   LABOPIA NEG 05/02/2013 1602   COCAINSCRNUR POSITIVE* 09/13/2013 2010   COCAINSCRNUR PPS 05/02/2013 1602   LABBENZ NONE DETECTED 09/13/2013 2010   LABBENZ NEG 05/02/2013 1602   AMPHETMU NONE DETECTED 09/13/2013 2010   THCU NONE DETECTED 09/13/2013 2010   LABBARB NONE DETECTED 09/13/2013 2010   LABBARB NEG 05/02/2013 1602    Urinalysis:  Recent Labs Lab 09/13/13 1235  COLORURINE YELLOW  LABSPEC 1.035*  PHURINE 6.0  GLUCOSEU NEGATIVE  HGBUR NEGATIVE  BILIRUBINUR SMALL*  KETONESUR 15*  PROTEINUR NEGATIVE  UROBILINOGEN 1.0  NITRITE NEGATIVE  LEUKOCYTESUR NEGATIVE   Misc. Labs:  BCx x 2 pending Influenza pcr pending  Micro Results: No results found for this or any previous visit (from the past 240 hour(s)). Studies/Results: Dg Chest 2 View  09/13/2013   CLINICAL DATA:  Body aches and chills and chest pain.  EXAM: CHEST  2 VIEW  COMPARISON:  August 15, 2013.  FINDINGS: The lungs are hyper inflated. There is increased  density in the left infrahilar region consistent with atelectasis or developing pneumonia. These findings are more conspicuous on today's study than on the previous exam there is no pleural effusion. The cardiopericardial silhouette is normal in size. The mediastinum is normal in width. There is no pleural effusion. The observed portions of the bony thorax appear normal  IMPRESSION: Increase conspicuity of interstitial density at the left lung base is consistent with atelectasis and/or pneumonia. This is superimposed upon findings of COPD. Followup films following therapy are recommended to assure clearing.   Electronically Signed   By: David  [place]   On: 09/13/2013 13:12   Medications: I have reviewed the patient's  current medications. Scheduled Meds: . amLODipine  5 mg Oral Daily  . aspirin  81 mg Oral Daily  . elvitegravir-cobicistat-emtricitabine-tenofovir  1 tablet Oral Q breakfast  . heparin  5,000 Units Subcutaneous Q8H  . oseltamivir  75 mg Oral Daily  . sodium chloride  3 mL Intravenous Q12H   Continuous Infusions:  PRN Meds:.menthol-cetylpyridinium, morphine injection, ondansetron (ZOFRAN) IV, ondansetron, sodium chloride  Assessment/Plan:  #CAP in setting of influenza +- Patient reports symtoms of nonproductive cough, shortness of breath, fever, headache, myalgias since 12/26. Afebrile in ED and with no tachycardia, tachypnea, or leucocytosis (though HIV patient and neutrophilic predominance) thus patient does not meet any SIRS criteria. CXR showed interstitial density at left lung base consistent with atelectasis and/or PNA (superimposed on findings of COPD). Will need repeat CXR in 6 weeks. Patient had desatted to 76s in ED which is why he was admitted, but today patient's SpO2 95% on room air with ambulation. He did get a flu shot this year.  -blood cultures x 2 pending (collected prior to initiation of abx)  -Droplet isolation -Tamiflu 75mg  BID for tx of influenza, total 5 day course -levofloxacin 750 mg POqdaily total 5 day course -pt violated pain contract w/ PCP given UDS + cocaine this admission, so will discontinue all narcotics upon discharge -trop neg x 3  #HTN- normotensive now, BPs 120s/70s. On amlodipine 5 mg daily with good compliance.  -continue amlodipine 5 mg daily   #HIV- CD4 740, viral load 557 in 03/2013. Patient follows with Dr. Orvan Falconer. He reports excellent compliance with his medication.  -continue Stribild 150-150-200-300 mg daily   #DVT PPX- heparin subq TID   #Code status- Full code  Dispo: Disposition is deferred at this time, awaiting improvement of current medical problems.  Anticipated discharge in approximately 1 day(s).   The patient does have a  current PCP (Otis Brace, MD) and does need an [facility] hospital follow-up appointment after discharge.  The patient does not have transportation limitations that hinder transportation to clinic appointments.  .Services Needed at time of discharge: Y = Yes, Blank = No PT:   OT:   RN:   Equipment:   Other:     LOS: 1 day   Windell Hummingbird, MD 09/14/2013, 9:20 AM

## 2013-09-14 NOTE — H&P (Signed)
  Date: 09/14/2013  Patient name: Kenneth [name]  Medical record number: [id number]  Date of birth: 12-24-1955   I have seen and evaluated [name] and discussed their care with the Residency Team.   Assessment and Plan: I have seen and evaluated the patient as outlined above. I agree with the formulated Assessment and Plan as detailed in the residents' admission note, with the following changes:   1. Influenza A - pt's sxs were of rapid onset, c/w  The flu. He is on Tamiflu and will cont this as an outpt.   2. LLL CAP - this was seen on CXR. He is symptomatic and not able to distinguish from flu so treat as CAP with levoquin.  3. HIV - cont antivirals. F/U Dr Orvan Falconer.  D/C home  Burns Spain, MD 12/28/20142:46 PM

## 2013-09-14 NOTE — Discharge Summary (Signed)
Name: Kenneth [name] MRN: [id number] DOB: October 05, 1955 57 y.o. PCP: Otis Brace, MD  Date of Admission: 09/13/2013 12:25 PM Date of Discharge: 09/14/2013 Attending Physician: Burns Spain, MD  Discharge Diagnosis:  Principal Problem:   CAP (community acquired pneumonia) in setting of Influenza  Active Problems:   HTN (hypertension)   Nondependent cocaine abuse   Chronic pain   HIV (human immunodeficiency virus infection)  Discharge Medications:   Medication List    STOP taking these medications       oxyCODONE 5 MG immediate release tablet  Commonly known as:  ROXICODONE      TAKE these medications       acetaminophen 500 MG tablet  Commonly known as:  TYLENOL  Take 1 tablet (500 mg total) by mouth every 6 (six) hours as needed.     amLODipine 5 MG tablet  Commonly known as:  NORVASC  Take 1 tablet (5 mg total) by mouth daily.     aspirin 81 MG chewable tablet  Chew 81 mg by mouth daily.     elvitegravir-cobicistat-emtricitabine-tenofovir 150-150-200-300 MG Tabs tablet  Commonly known as:  STRIBILD  Take 1 tablet by mouth daily with breakfast.     EPINEPHrine 0.3 mg/0.3 mL Soaj injection  Commonly known as:  EPI-PEN  Inject 0.3 mg into the muscle once as needed (Anaphylaxis).     levofloxacin 750 MG tablet  Commonly known as:  LEVAQUIN  Take 1 tablet (750 mg total) by mouth daily.     multivitamin with minerals Tabs tablet  Take 1 tablet by mouth daily.     nitroGLYCERIN 0.4 MG SL tablet  Commonly known as:  NITROSTAT  Place 0.4 mg under the tongue every 5 (five) minutes as needed for chest pain. Pt hasn't had to use rx     oseltamivir 75 MG capsule  Commonly known as:  TAMIFLU  Take 1 capsule (75 mg total) by mouth 2 (two) times daily.        Disposition and follow-up:   KennethDonevin [name] was discharged from [facility] in Stable condition.  At the hospital follow up visit please address:  1.  CAP in setting of influenza-  assess symptom resolution   2.  Labs / imaging needed at time of follow-up: will need f/u CXR to assess resolution of PNA in 6 weeks  3.  Pending labs/ test needing follow-up: BCx x 2  Follow-up Appointments:  Patient instructed to follow up with his PCP in 1-2 weeks. I also sent a memo to our clinic front desk to call patient to make sure he gets a follow up appointment.  Discharge Instructions:      Future Appointments Provider Department Dept Phone   09/15/2013 9:45 AM [facility] for Infectious Disease [phone]   09/30/2013 9:45 AM Cliffton Asters, MD [facility] for Infectious Disease [phone]      Consultations:  none  Procedures Performed:  Dg Chest 2 View  09/13/2013   CLINICAL DATA:  Body aches and chills and chest pain.  EXAM: CHEST  2 VIEW  COMPARISON:  August 15, 2013.  FINDINGS: The lungs are hyper inflated. There is increased density in the left infrahilar region consistent with atelectasis or developing pneumonia. These findings are more conspicuous on today's study than on the previous exam there is no pleural effusion. The cardiopericardial silhouette is normal in size. The mediastinum is normal in width. There is no pleural effusion. The observed  portions of the bony thorax appear normal  IMPRESSION: Increase conspicuity of interstitial density at the left lung base is consistent with atelectasis and/or pneumonia. This is superimposed upon findings of COPD. Followup films following therapy are recommended to assure clearing.   Electronically Signed   By: David  [place]   On: 09/13/2013 13:12   Dg Chest 2 View  08/15/2013   CLINICAL DATA:  One week history of upper chest pain  EXAM: CHEST  2 VIEW  COMPARISON:  Prior chest x-ray 08/11/2013  FINDINGS: Stable appearance of the chest with pulmonary hyperexpansion and bilateral upper lung emphysema. Central bronchitic changes are also present and unchanged. Stable cardiac and  mediastinal contours. No focal airspace consolidation, pleural effusion, pulmonary edema or pneumothorax. No acute osseous abnormality.  IMPRESSION: No active cardiopulmonary disease.  Stable background changes of upper lung predominant emphysema and underlying COPD.   Electronically Signed   By: Malachy Moan M.D.   On: 08/15/2013 16:03   Dg Lumbar Spine Complete  08/30/2013   CLINICAL DATA:  History of lumbar fusion with recent injury  EXAM: LUMBAR SPINE - COMPLETE 4+ VIEW  COMPARISON:  06/30/2013.  FINDINGS: Vertebral body height is well maintained. Postsurgical changes are noted stable from the previous exam. No acute fracture is identified. No pars defects are seen.  IMPRESSION: Chronic changes without acute abnormality.   Electronically Signed   By: Alcide Clever M.D.   On: 08/30/2013 13:14   Admission HPI:  Kenneth [name] is a 57 year old man with PMH of HIV (CD4 740, viral load 557 in 03/2013), HTN, HL, ?CAD, chronic bronchitis who presents with shortness of breath, cough, fever, myalgias.   Patient states that yesterday 12/26 he was watching TV when he noticed that his hands were aching. He also started having nonproductive cough and shortness of breath, headache, fever not long after. He reports that his temperature was 101F last night when his wife checked it; he took Tylenol which brought his fever down but did not resolve his myalgias. No difficulty moving his neck. No sick contacts. Denies smoking, EtOH, drugs. On ROS, reports vomiting x 2 this morning, NBNB.   Hospital Course by problem list:  #CAP in setting of influenza- Patient admitted w/ symtoms of nonproductive cough, shortness of breath, fever, headache, myalgias. Pt did not meet any SIRS criteria during his admission. CXR showed interstitial density at left lung base consistent with possible PNA. Influenza positive. Two days of levaquin 750mg  was given, discharged with 3 more days worth. Tamiflu prophylaxis was started while  influenza pcr pending, and once it resulted tamiflu was increased to treatment dose of 75mg  BID, discharged with three and a half more days worth. Will need repeat CXR in 6 weeks. Patient had desatted to 40s in ED which is why he was admitted, but on discharge patient's SpO2 95% on room air with ambulation. Of note, pt violated pain contract w/ PCP given UDS + cocaine this admission, so discontinued all narcotics upon discharge.  #HTN- Stable, remained normotensive. Pt did have nonspecific T wave changes on admission EKG, so troponin cycled, which were negative x 3. Continued home amlodipine 5 mg daily.  #HIV- CD4 740, viral load 557 in 03/2013. Patient follows with Dr. Orvan Falconer. He reports excellent compliance with his medication. Continued Stribild 150-150-200-300 mg daily.  Discharge Vitals:   BP 120/76  Pulse 54  Temp(Src) 99.7 F (37.6 C) (Oral)  Resp 20  Ht 5' 8.9" (1.75 m)  Wt 70.336 kg (155  lb 1 oz)  BMI 22.97 kg/m2  SpO2 98%  Discharge Labs:  Results for orders placed during the hospital encounter of 09/13/13 (from the past 24 hour(s))  TROPONIN I     Status: None   Collection Time    09/13/13  2:00 PM      Result Value Range   Troponin I <0.30  <0.30 ng/mL  CULTURE, BLOOD (ROUTINE X 2)     Status: None   Collection Time    09/13/13  5:00 PM      Result Value Range   Specimen Description BLOOD RIGHT ARM     Special Requests BOTTLES DRAWN AEROBIC AND ANAEROBIC 5CC     Culture  Setup Time       Value: 09/13/2013 20:12     Performed at [facility]   Culture       Value:        BLOOD CULTURE RECEIVED NO GROWTH TO DATE CULTURE WILL BE HELD FOR 5 DAYS BEFORE ISSUING A FINAL NEGATIVE REPORT     Performed at [facility]   Report Status PENDING    CULTURE, BLOOD (ROUTINE X 2)     Status: None   Collection Time    09/13/13  5:12 PM      Result Value Range   Specimen Description BLOOD LEFT ARM     Special Requests BOTTLES DRAWN AEROBIC AND ANAEROBIC 5CC      Culture  Setup Time       Value: 09/13/2013 20:12     Performed at [facility]   Culture       Value:        BLOOD CULTURE RECEIVED NO GROWTH TO DATE CULTURE WILL BE HELD FOR 5 DAYS BEFORE ISSUING A FINAL NEGATIVE REPORT     Performed at [facility]   Report Status PENDING    INFLUENZA PANEL BY PCR     Status: Abnormal   Collection Time    09/13/13  6:28 PM      Result Value Range   Influenza A By PCR POSITIVE (*) NEGATIVE   Influenza B By PCR NEGATIVE  NEGATIVE   H1N1 flu by pcr NOT DETECTED  NOT DETECTED  TROPONIN I     Status: None   Collection Time    09/13/13  6:30 PM      Result Value Range   Troponin I <0.30  <0.30 ng/mL  URINE RAPID DRUG SCREEN (HOSP PERFORMED)     Status: Abnormal   Collection Time    09/13/13  8:10 PM      Result Value Range   Opiates POSITIVE (*) NONE DETECTED   Cocaine POSITIVE (*) NONE DETECTED   Benzodiazepines NONE DETECTED  NONE DETECTED   Amphetamines NONE DETECTED  NONE DETECTED   Tetrahydrocannabinol NONE DETECTED  NONE DETECTED   Barbiturates NONE DETECTED  NONE DETECTED  TROPONIN I     Status: None   Collection Time    09/14/13 12:20 AM      Result Value Range   Troponin I <0.30  <0.30 ng/mL  BASIC METABOLIC PANEL     Status: Abnormal   Collection Time    09/14/13  6:15 AM      Result Value Range   Sodium 133 (*) 135 - 145 mEq/L   Potassium 3.8  3.5 - 5.1 mEq/L   Chloride 101  96 - 112 mEq/L   CO2 22  19 - 32 mEq/L   Glucose,  Bld 82  70 - 99 mg/dL   BUN 14  6 - 23 mg/dL   Creatinine, Ser 4.78  0.50 - 1.35 mg/dL   Calcium 8.7  8.4 - 29.5 mg/dL   GFR calc non Af Amer 79 (*) >90 mL/min   GFR calc Af Amer >90  >90 mL/min  CBC     Status: Abnormal   Collection Time    09/14/13  6:15 AM      Result Value Range   WBC 6.5  4.0 - 10.5 K/uL   RBC 5.10  4.22 - 5.81 MIL/uL   Hemoglobin 14.2  13.0 - 17.0 g/dL   HCT 62.1  30.8 - 65.7 %   MCV 81.6  78.0 - 100.0 fL   MCH 27.8  26.0 - 34.0 pg   MCHC 34.1  30.0 - 36.0  g/dL   RDW 84.6 (*) 96.2 - 95.2 %   Platelets 259  150 - 400 K/uL  TROPONIN I     Status: None   Collection Time    09/14/13  6:15 AM      Result Value Range   Troponin I <0.30  <0.30 ng/mL    Signed: Windell Hummingbird, MD 09/14/2013, 1:22 PM   Time Spent on Discharge: 35 minutes Services Ordered on Discharge: none  Equipment Ordered on Discharge: none

## 2013-09-14 NOTE — Progress Notes (Signed)
Pt discharged from 6N11 at 1445, taken to ED where wife is currently a patient. D/C instructions reviewed and pt verbalized understanding, all questions answered. VSS, no complaints. Mick Sell RN

## 2013-09-15 ENCOUNTER — Other Ambulatory Visit: Payer: Medicare Other

## 2013-09-17 NOTE — ED Provider Notes (Signed)
Medical screening examination/treatment/procedure(s) were conducted as a shared visit with non-physician practitioner(s) and myself.  I personally evaluated the patient during the encounter. Please see my previous note.     Laray Anger, DO 09/17/13 1334

## 2013-09-19 LAB — CULTURE, BLOOD (ROUTINE X 2)
Culture: NO GROWTH
Culture: NO GROWTH

## 2013-09-21 ENCOUNTER — Encounter (HOSPITAL_COMMUNITY): Payer: Self-pay | Admitting: Emergency Medicine

## 2013-09-21 ENCOUNTER — Emergency Department (HOSPITAL_COMMUNITY): Payer: Medicare Other

## 2013-09-21 ENCOUNTER — Emergency Department (HOSPITAL_COMMUNITY)
Admission: EM | Admit: 2013-09-21 | Discharge: 2013-09-21 | Disposition: A | Discharge status: Home / Self Care | Payer: Medicare Other | Attending: Emergency Medicine | Admitting: Emergency Medicine

## 2013-09-21 DIAGNOSIS — Z8739 Personal history of other diseases of the musculoskeletal system and connective tissue: Secondary | ICD-10-CM | POA: Insufficient documentation

## 2013-09-21 DIAGNOSIS — M545 Low back pain, unspecified: Secondary | ICD-10-CM | POA: Insufficient documentation

## 2013-09-21 DIAGNOSIS — R51 Headache: Secondary | ICD-10-CM | POA: Insufficient documentation

## 2013-09-21 DIAGNOSIS — M129 Arthropathy, unspecified: Secondary | ICD-10-CM | POA: Insufficient documentation

## 2013-09-21 DIAGNOSIS — Z21 Asymptomatic human immunodeficiency virus [HIV] infection status: Secondary | ICD-10-CM | POA: Insufficient documentation

## 2013-09-21 DIAGNOSIS — G8929 Other chronic pain: Secondary | ICD-10-CM | POA: Insufficient documentation

## 2013-09-21 DIAGNOSIS — IMO0001 Reserved for inherently not codable concepts without codable children: Secondary | ICD-10-CM | POA: Insufficient documentation

## 2013-09-21 DIAGNOSIS — I10 Essential (primary) hypertension: Secondary | ICD-10-CM | POA: Insufficient documentation

## 2013-09-21 DIAGNOSIS — M791 Myalgia, unspecified site: Secondary | ICD-10-CM

## 2013-09-21 DIAGNOSIS — Z87891 Personal history of nicotine dependence: Secondary | ICD-10-CM | POA: Insufficient documentation

## 2013-09-21 DIAGNOSIS — G43909 Migraine, unspecified, not intractable, without status migrainosus: Secondary | ICD-10-CM | POA: Insufficient documentation

## 2013-09-21 DIAGNOSIS — Z8701 Personal history of pneumonia (recurrent): Secondary | ICD-10-CM | POA: Insufficient documentation

## 2013-09-21 DIAGNOSIS — Z8661 Personal history of infections of the central nervous system: Secondary | ICD-10-CM | POA: Insufficient documentation

## 2013-09-21 DIAGNOSIS — Z7982 Long term (current) use of aspirin: Secondary | ICD-10-CM | POA: Insufficient documentation

## 2013-09-21 DIAGNOSIS — I2510 Atherosclerotic heart disease of native coronary artery without angina pectoris: Secondary | ICD-10-CM | POA: Insufficient documentation

## 2013-09-21 DIAGNOSIS — R519 Headache, unspecified: Secondary | ICD-10-CM

## 2013-09-21 DIAGNOSIS — Z79899 Other long term (current) drug therapy: Secondary | ICD-10-CM | POA: Insufficient documentation

## 2013-09-21 LAB — RAPID URINE DRUG SCREEN, HOSP PERFORMED
Amphetamines: NOT DETECTED
Barbiturates: NOT DETECTED
Benzodiazepines: NOT DETECTED
Cocaine: NOT DETECTED
Opiates: NOT DETECTED
Tetrahydrocannabinol: NOT DETECTED

## 2013-09-21 LAB — URINALYSIS, ROUTINE W REFLEX MICROSCOPIC
Bilirubin Urine: NEGATIVE
Glucose, UA: NEGATIVE mg/dL
Hgb urine dipstick: NEGATIVE
Ketones, ur: NEGATIVE mg/dL
Leukocytes, UA: NEGATIVE
Nitrite: NEGATIVE
Protein, ur: NEGATIVE mg/dL
Specific Gravity, Urine: 1.022 (ref 1.005–1.030)
Urobilinogen, UA: 1 mg/dL (ref 0.0–1.0)
pH: 7.5 (ref 5.0–8.0)

## 2013-09-21 LAB — CBC WITH DIFFERENTIAL/PLATELET
Basophils Absolute: 0 10*3/uL (ref 0.0–0.1)
Basophils Relative: 0 % (ref 0–1)
Eosinophils Absolute: 0 10*3/uL (ref 0.0–0.7)
Eosinophils Relative: 0 % (ref 0–5)
HCT: 39.8 % (ref 39.0–52.0)
Hemoglobin: 12.8 g/dL — ABNORMAL LOW (ref 13.0–17.0)
Lymphocytes Relative: 9 % — ABNORMAL LOW (ref 12–46)
Lymphs Abs: 1.2 10*3/uL (ref 0.7–4.0)
MCH: 26.1 pg (ref 26.0–34.0)
MCHC: 32.2 g/dL (ref 30.0–36.0)
MCV: 81.2 fL (ref 78.0–100.0)
Monocytes Absolute: 0.7 10*3/uL (ref 0.1–1.0)
Monocytes Relative: 5 % (ref 3–12)
Neutro Abs: 11.5 10*3/uL — ABNORMAL HIGH (ref 1.7–7.7)
Neutrophils Relative %: 86 % — ABNORMAL HIGH (ref 43–77)
Platelets: 307 10*3/uL (ref 150–400)
RBC: 4.9 MIL/uL (ref 4.22–5.81)
RDW: 16 % — ABNORMAL HIGH (ref 11.5–15.5)
WBC: 13.4 10*3/uL — ABNORMAL HIGH (ref 4.0–10.5)

## 2013-09-21 LAB — BASIC METABOLIC PANEL
BUN: 17 mg/dL (ref 6–23)
CO2: 25 mEq/L (ref 19–32)
Calcium: 9.2 mg/dL (ref 8.4–10.5)
Chloride: 97 mEq/L (ref 96–112)
Creatinine, Ser: 0.99 mg/dL (ref 0.50–1.35)
GFR calc Af Amer: >90 mL/min (ref >90)
GFR calc non Af Amer: 88 mL/min — ABNORMAL LOW (ref >90)
Glucose, Bld: 116 mg/dL — ABNORMAL HIGH (ref 70–99)
Potassium: 4.2 mEq/L (ref 3.7–5.3)
Sodium: 132 mEq/L — ABNORMAL LOW (ref 137–147)

## 2013-09-21 MED ORDER — ACETAMINOPHEN 325 MG PO TABS
650.0000 mg | ORAL_TABLET | Freq: Once | ORAL | Status: AC
  Administered 2013-09-21: 650 mg via ORAL
  Filled 2013-09-21: qty 2

## 2013-09-21 MED ORDER — SODIUM CHLORIDE 0.9 % IV BOLUS (SEPSIS)
1000.0000 mL | Freq: Once | INTRAVENOUS | Status: AC
  Administered 2013-09-21: 1000 mL via INTRAVENOUS

## 2013-09-21 NOTE — ED Notes (Addendum)
Pt states he was just released Tuesday from [facility] wit the flu and pneumonia. States last night he began to have body aches again when he was admitted.  Pt also c/o dry cough.

## 2013-09-21 NOTE — ED Provider Notes (Signed)
CSN: [id number]     Arrival date & time 09/21/13  0820 History   First MD Initiated Contact with Patient 09/21/13 [phone]     Chief Complaint  Patient presents with  . Generalized Body Aches   (Consider location/radiation/quality/duration/timing/severity/associated sxs/prior Treatment) HPI Comments: Mr. Mcmanigal is a 58 year old man with PMH of HIV (CD4 740, viral load 557 in 03/2013), HTN, HL, CAD, chronic bronchitis who presents to the Emergency Department with a chief complaint of generalized body aches and headache since yesterday.  He reports frontal headache without radiation. He reports associated neck pain.  The patient denies fever or chills. He denies nausea or vomiting.  The patient reports he was discharged on 09/14/2013 and has been compliant with Tamiflu and Levaquin.  The history is provided by the patient and medical records. No language interpreter was used.    Past Medical History  Diagnosis Date  . HIV positive 2004  . Hypertension   . Meningitis   . Coronary artery disease   . Chest pain at rest 09/23/2012  . Chronic bronchitis     "q year last 5 yr or so" (09/24/2012)  . Exertional dyspnea   . Migraines   . Arthritis     "both shoulders" (09/24/2012)  . Chronic lower back pain   . Pneumonia   . Lumbar disc disease   . Iliac artery aneurysm     Right   Past Surgical History  Procedure Laterality Date  . Intussusception repair  10/2011  . Posterior lumbar fusion  1995  . Elbow surgery  ~ 1997    "removed some stones; right" (09/24/2012)  . Appendectomy  2013  . Back surgery  2006    4 BACK SURGERIES  . Abdominal surgery    . Spine surgery      Injury to back 1995   Family History  Problem Relation Age of Onset  . Heart disease Father   . Heart disease Brother   . Diabetes Brother    History  Substance Use Topics  . Smoking status: Former Smoker -- 0.10 packs/day for 45 years    Types: Cigarettes    Quit date: 09/24/2012  . Smokeless tobacco: Never Used      Comment: 09/24/2012 "been stopped now ~ 8 month".  3 per day now.  . Alcohol Use: No    Review of Systems  Constitutional: Positive for fever. Negative for chills.  Respiratory: Positive for cough.   Cardiovascular: Negative for chest pain, palpitations and leg swelling.  Gastrointestinal: Negative for nausea, vomiting, abdominal pain, diarrhea and constipation.  Musculoskeletal: Positive for arthralgias and myalgias.    Allergies  Bee venom; Flexeril; Hydrocodone; Nabumetone; [name]; Tramadol; Ace inhibitors; and Other  Home Medications   Current Outpatient Rx  Name  Route  Sig  Dispense  Refill  . acetaminophen (TYLENOL) 500 MG tablet   Oral   Take 1 tablet (500 mg total) by mouth every 6 (six) hours as needed.   30 tablet   0   . amLODipine (NORVASC) 5 MG tablet   Oral   Take 1 tablet (5 mg total) by mouth daily.   30 tablet   10   . aspirin 81 MG chewable tablet   Oral   Chew 81 mg by mouth daily.          Marland Kitchen elvitegravir-cobicistat-emtricitabine-tenofovir (STRIBILD) 150-150-200-300 MG TABS tablet   Oral   Take 1 tablet by mouth daily with breakfast.   30 tablet  11   . EPINEPHrine (EPI-PEN) 0.3 mg/0.3 mL SOAJ   Intramuscular   Inject 0.3 mg into the muscle once as needed (Anaphylaxis).          Marland Kitchen levofloxacin (LEVAQUIN) 750 MG tablet   Oral   Take 1 tablet (750 mg total) by mouth daily.   3 tablet   0   . Multiple Vitamin (MULTIVITAMIN WITH MINERALS) TABS   Oral   Take 1 tablet by mouth daily.         Marland Kitchen oxyCODONE-acetaminophen (PERCOCET/ROXICET) 5-325 MG per tablet   Oral   Take 1-2 tablets by mouth every 4 (four) hours as needed for severe pain.         Marland Kitchen tiZANidine (ZANAFLEX) 4 MG tablet   Oral   Take 4 mg by mouth every 6 (six) hours as needed for muscle spasms.         . nitroGLYCERIN (NITROSTAT) 0.4 MG SL tablet   Sublingual   Place 0.4 mg under the tongue every 5 (five) minutes as needed for chest pain. Pt hasn't had to use rx          . oseltamivir (TAMIFLU) 75 MG capsule   Oral   Take 1 capsule (75 mg total) by mouth 2 (two) times daily.   7 capsule   0    BP 110/67  Pulse 79  Temp(Src) 99.7 F (37.6 C) (Oral)  Resp 20  SpO2 96% Physical Exam  Nursing note and vitals reviewed. Constitutional: He is oriented to person, place, and time. He appears well-developed.  Cachectic  HENT:  Head: Normocephalic and atraumatic.  Right Ear: External ear normal.  Left Ear: External ear normal.  Eyes: EOM are normal. Pupils are equal, round, and reactive to light. No scleral icterus.  Neck: Normal range of motion. Neck supple.  Cardiovascular: Normal rate and regular rhythm.   Pulmonary/Chest: Effort normal and breath sounds normal. He has no wheezes. He has no rales.  Abdominal: Soft. Bowel sounds are normal. He exhibits no distension. There is no tenderness. There is no rebound.  Musculoskeletal: He exhibits no edema.  Neurological: He is alert and oriented to person, place, and time.  Skin: Skin is warm and dry.  Psychiatric: He has a normal mood and affect. His behavior is normal.    ED Course  Procedures (including critical care time) Labs Review Labs Reviewed  CBC WITH DIFFERENTIAL - Abnormal; Notable for the following:    WBC 13.4 (*)    Hemoglobin 12.8 (*)    RDW 16.0 (*)    Neutrophils Relative % 86 (*)    Neutro Abs 11.5 (*)    Lymphocytes Relative 9 (*)    All other components within normal limits  BASIC METABOLIC PANEL - Abnormal; Notable for the following:    Sodium 132 (*)    Glucose, Bld 116 (*)    GFR calc non Af Amer 88 (*)    All other components within normal limits  URINALYSIS, ROUTINE W REFLEX MICROSCOPIC  URINE RAPID DRUG SCREEN (HOSP PERFORMED)   Imaging Review Dg Chest 2 View  09/21/2013   CLINICAL DATA:  Shortness of breath with chest pain today. Recent pneumonia. Smoker appear  EXAM: CHEST  2 VIEW  COMPARISON:  08/15/2013 and 09/13/2013 radiographs; CT 09/27/2012.  FINDINGS:  The heart size and mediastinal contours are stable. The lungs are hyperinflated, corresponding with moderate emphysema on prior chest CT. Compared with baseline studies, there is further increase in the interstitial prominence  at both lung bases. There is no confluent airspace opacity, pleural effusion or pneumothorax. The osseous structures appear unchanged.  IMPRESSION: COPD with further increase in substantial prominence at both lung bases. This could reflect atelectasis, edema or atypical inflammation. No consolidation is identified.   Electronically Signed   By: Camie Patience M.D.   On: 09/21/2013 09:12    EKG Interpretation   None       MDM   1. Headache   2. Myalgia    Pt with a history of HIV presents with headache and generalized body aches. Likely viral, will evaluate for pneumonia. Discussed patient history, condition, and labs with Dr. Eulis Foster who agrees the patient can be evaluated as an out-pt. Re-eval patient reports symptom improvement. Discussed lab results, imaging results, and treatment plan with the patient. Return precautions given. Reports understanding and no other concerns at this time.  Patient is stable for discharge at this time.  Meds given in ED:  Medications  sodium chloride 0.9 % bolus 1,000 mL (0 mLs Intravenous Stopped 09/21/13 1057)  acetaminophen (TYLENOL) tablet 650 mg (650 mg Oral Given 09/21/13 0922)    Discharge Medication List as of 09/21/2013 12:52 PM        [place], PA-C 09/24/13 [phone]

## 2013-09-21 NOTE — Discharge Instructions (Signed)
Call for a follow up appointment with a Family or Primary Care Provider, keep your appointment for 09/26/2012 that you have already scheduled. Return if Symptoms worsen.   Take medication as prescribed.  Continue to take the Levaquin as prescribed.

## 2013-09-21 NOTE — ED Notes (Signed)
Patient transported to X-ray 

## 2013-09-21 NOTE — ED Notes (Signed)
Ambulated patient... O2 94 heart rate 101

## 2013-09-24 NOTE — ED Provider Notes (Signed)
Medical screening examination/treatment/procedure(s) were performed by non-physician practitioner and as supervising physician I was immediately available for consultation/collaboration.  EKG Interpretation   None        Richarda Blade, MD 09/24/13 1620

## 2013-09-25 ENCOUNTER — Ambulatory Visit: Payer: Medicare Other | Admitting: Internal Medicine

## 2013-09-25 ENCOUNTER — Encounter: Payer: Self-pay | Admitting: Internal Medicine

## 2013-09-30 ENCOUNTER — Emergency Department (HOSPITAL_COMMUNITY)
Admission: EM | Admit: 2013-09-30 | Discharge: 2013-10-01 | Disposition: A | Discharge status: Home / Self Care | Payer: Medicare Other | Attending: Emergency Medicine | Admitting: Emergency Medicine

## 2013-09-30 ENCOUNTER — Encounter (HOSPITAL_COMMUNITY): Payer: Self-pay | Admitting: Emergency Medicine

## 2013-09-30 ENCOUNTER — Ambulatory Visit: Payer: Medicare Other | Admitting: Internal Medicine

## 2013-09-30 DIAGNOSIS — R5381 Other malaise: Secondary | ICD-10-CM | POA: Insufficient documentation

## 2013-09-30 DIAGNOSIS — M19019 Primary osteoarthritis, unspecified shoulder: Secondary | ICD-10-CM | POA: Insufficient documentation

## 2013-09-30 DIAGNOSIS — R5383 Other fatigue: Secondary | ICD-10-CM | POA: Insufficient documentation

## 2013-09-30 DIAGNOSIS — I10 Essential (primary) hypertension: Secondary | ICD-10-CM | POA: Insufficient documentation

## 2013-09-30 DIAGNOSIS — Z8701 Personal history of pneumonia (recurrent): Secondary | ICD-10-CM | POA: Insufficient documentation

## 2013-09-30 DIAGNOSIS — Z7982 Long term (current) use of aspirin: Secondary | ICD-10-CM | POA: Insufficient documentation

## 2013-09-30 DIAGNOSIS — S7000XA Contusion of unspecified hip, initial encounter: Secondary | ICD-10-CM | POA: Insufficient documentation

## 2013-09-30 DIAGNOSIS — S40012A Contusion of left shoulder, initial encounter: Secondary | ICD-10-CM

## 2013-09-30 DIAGNOSIS — I2510 Atherosclerotic heart disease of native coronary artery without angina pectoris: Secondary | ICD-10-CM | POA: Insufficient documentation

## 2013-09-30 DIAGNOSIS — R269 Unspecified abnormalities of gait and mobility: Secondary | ICD-10-CM | POA: Insufficient documentation

## 2013-09-30 DIAGNOSIS — S40019A Contusion of unspecified shoulder, initial encounter: Secondary | ICD-10-CM | POA: Insufficient documentation

## 2013-09-30 DIAGNOSIS — Z79899 Other long term (current) drug therapy: Secondary | ICD-10-CM | POA: Insufficient documentation

## 2013-09-30 DIAGNOSIS — W19XXXA Unspecified fall, initial encounter: Secondary | ICD-10-CM

## 2013-09-30 DIAGNOSIS — Y9389 Activity, other specified: Secondary | ICD-10-CM | POA: Insufficient documentation

## 2013-09-30 DIAGNOSIS — Y92009 Unspecified place in unspecified non-institutional (private) residence as the place of occurrence of the external cause: Secondary | ICD-10-CM | POA: Insufficient documentation

## 2013-09-30 DIAGNOSIS — Z9889 Other specified postprocedural states: Secondary | ICD-10-CM | POA: Insufficient documentation

## 2013-09-30 DIAGNOSIS — G43909 Migraine, unspecified, not intractable, without status migrainosus: Secondary | ICD-10-CM | POA: Insufficient documentation

## 2013-09-30 DIAGNOSIS — Z8709 Personal history of other diseases of the respiratory system: Secondary | ICD-10-CM | POA: Insufficient documentation

## 2013-09-30 DIAGNOSIS — W108XXA Fall (on) (from) other stairs and steps, initial encounter: Secondary | ICD-10-CM | POA: Insufficient documentation

## 2013-09-30 DIAGNOSIS — G8929 Other chronic pain: Secondary | ICD-10-CM | POA: Insufficient documentation

## 2013-09-30 DIAGNOSIS — Z87891 Personal history of nicotine dependence: Secondary | ICD-10-CM | POA: Insufficient documentation

## 2013-09-30 DIAGNOSIS — Z21 Asymptomatic human immunodeficiency virus [HIV] infection status: Secondary | ICD-10-CM | POA: Insufficient documentation

## 2013-09-30 DIAGNOSIS — S7002XA Contusion of left hip, initial encounter: Secondary | ICD-10-CM

## 2013-09-30 NOTE — ED Notes (Signed)
Pt reports slipping and falling down 10 steps at home. Pt does not recall blacking out and reports pain to L leg and shoulder. Pt states that he was not able to walk on leg after falling.

## 2013-09-30 NOTE — ED Provider Notes (Signed)
CSN: [id number]     Arrival date & time 09/30/13  2258 History   First MD Initiated Contact with Patient 09/30/13 2358     Chief Complaint  Patient presents with  . Fall   (Consider location/radiation/quality/duration/timing/severity/associated sxs/prior Treatment) HPI Comments: Kenneth [name] is a 58 year old man with PMH of HIV (CD4 740, viral load 557 in 03/2013), HTN, HL, CAD, chronic bronchitis who presents to the Emergency Department with a mechanical fall today.  The patient reports he slipped on the top stair and fell on his left hip and shoulder.  He reports falling approximately 10 steps.  He denies blow to head or LOC.  He reports he was unable to ambulate due to pain. He denies EtOH or drug use.  Patient is a 58 y.o. male presenting with fall. The history is provided by the patient and medical records. No language interpreter was used.  Fall Associated symptoms include arthralgias and weakness. Pertinent negatives include no abdominal pain, chills, fever, headaches, joint swelling, nausea, neck pain, numbness or vomiting.    Past Medical History  Diagnosis Date  . HIV positive 2004  . Hypertension   . Meningitis   . Coronary artery disease   . Chest pain at rest 09/23/2012  . Chronic bronchitis     "q year last 5 yr or so" (09/24/2012)  . Exertional dyspnea   . Migraines   . Arthritis     "both shoulders" (09/24/2012)  . Chronic lower back pain   . Pneumonia   . Lumbar disc disease   . Iliac artery aneurysm     Right   Past Surgical History  Procedure Laterality Date  . Intussusception repair  10/2011  . Posterior lumbar fusion  1995  . Elbow surgery  ~ 1997    "removed some stones; right" (09/24/2012)  . Appendectomy  2013  . Back surgery  2006    4 BACK SURGERIES  . Abdominal surgery    . Spine surgery      Injury to back 1995   Family History  Problem Relation Age of Onset  . Heart disease Father   . Heart disease Brother   . Diabetes Brother    History    Substance Use Topics  . Smoking status: Former Smoker -- 0.10 packs/day for 45 years    Types: Cigarettes    Quit date: 09/24/2012  . Smokeless tobacco: Never Used     Comment: 09/24/2012 "been stopped now ~ 8 month".  3 per day now.  . Alcohol Use: No    Review of Systems  Constitutional: Negative for fever and chills.  Gastrointestinal: Negative for nausea, vomiting, abdominal pain and diarrhea.  Musculoskeletal: Positive for arthralgias and gait problem. Negative for back pain, joint swelling, neck pain and neck stiffness.  Neurological: Positive for weakness. Negative for syncope, numbness and headaches.    Allergies  Bee venom; Flexeril; Hydrocodone; Nabumetone; [name]; Tramadol; Ace inhibitors; and Other  Home Medications   Current Outpatient Rx  Name  Route  Sig  Dispense  Refill  . acetaminophen (TYLENOL) 500 MG tablet   Oral   Take 1 tablet (500 mg total) by mouth every 6 (six) hours as needed.   30 tablet   0   . amLODipine (NORVASC) 5 MG tablet   Oral   Take 1 tablet (5 mg total) by mouth daily.   30 tablet   10   . aspirin 81 MG chewable tablet   Oral   Chew  81 mg by mouth daily.          Marland Kitchen elvitegravir-cobicistat-emtricitabine-tenofovir (STRIBILD) 150-150-200-300 MG TABS tablet   Oral   Take 1 tablet by mouth daily with breakfast.   30 tablet   11   . Multiple Vitamin (MULTIVITAMIN WITH MINERALS) TABS   Oral   Take 1 tablet by mouth daily.         Marland Kitchen oxyCODONE-acetaminophen (PERCOCET/ROXICET) 5-325 MG per tablet   Oral   Take 1-2 tablets by mouth every 4 (four) hours as needed for severe pain.         Marland Kitchen tiZANidine (ZANAFLEX) 4 MG tablet   Oral   Take 4 mg by mouth every 6 (six) hours as needed for muscle spasms.         Marland Kitchen EPINEPHrine (EPI-PEN) 0.3 mg/0.3 mL SOAJ   Intramuscular   Inject 0.3 mg into the muscle once as needed (Anaphylaxis).          . nitroGLYCERIN (NITROSTAT) 0.4 MG SL tablet   Sublingual   Place 0.4 mg under the  tongue every 5 (five) minutes as needed for chest pain. Pt hasn't had to use rx          BP 137/90  Pulse 64  Temp(Src) 97.8 F (36.6 C) (Oral)  Resp 14  Ht 5\' 7"  (1.702 m)  Wt 160 lb (72.576 kg)  BMI 25.05 kg/m2  SpO2 97% Physical Exam  Nursing note and vitals reviewed. Constitutional: He is oriented to person, place, and time. He appears well-developed and well-nourished. No distress.  HENT:  Head: Normocephalic and atraumatic.  Eyes: EOM are normal.  Neck: Normal range of motion. Neck supple.  Cardiovascular: Normal rate and regular rhythm.   Pulmonary/Chest: Effort normal and breath sounds normal. No respiratory distress. He has no wheezes. He has no rales. He exhibits no tenderness.  Abdominal: Soft. There is no tenderness. There is no rebound and no guarding.  Musculoskeletal:       Left shoulder: He exhibits bony tenderness. He exhibits normal range of motion, no swelling, no effusion, no crepitus and no deformity.       Left hip: He exhibits decreased range of motion and bony tenderness. He exhibits no swelling and no deformity.       Arms:      Legs: Left LE: decreased ROM secondary to pain.  No obvious deformity.   Neurological: He is alert and oriented to person, place, and time.  Skin: Skin is warm and dry.  Psychiatric: He has a normal mood and affect. His behavior is normal.    ED Course  Procedures (including critical care time) Labs Review Labs Reviewed  CBC - Abnormal; Notable for the following:    Hemoglobin 12.3 (*)    HCT 37.7 (*)    RDW 16.2 (*)    Platelets 448 (*)    All other components within normal limits  BASIC METABOLIC PANEL - Abnormal; Notable for the following:    GFR calc non Af Amer 89 (*)    All other components within normal limits   Imaging Review Dg Hip Complete Left  10/01/2013   CLINICAL DATA:  Fall with lateral left hip pain  EXAM: LEFT HIP - COMPLETE 2+ VIEW  COMPARISON:  08/17/2012  FINDINGS: There is no evidence of hip  fracture or dislocation. No evidence of pelvic ring fracture. Small marginal osteophytes, without significant hip joint narrowing. Endovascular coils in the region of the lateral sacral artery on the left.  IMPRESSION: No acute osseous abnormality.   Electronically Signed   By: Jorje Guild M.D.   On: 10/01/2013 01:31   Dg Shoulder Left  10/01/2013   CLINICAL DATA:  Fall with anterior shoulder pain  EXAM: LEFT SHOULDER - 2+ VIEW  COMPARISON:  06/26/2013  FINDINGS: There is no evidence of fracture or dislocation. There is mild spurring around the acromioclavicular joint, without significant joint narrowing. Insertional changes at the coracoclavicular ligament attachments.  IMPRESSION: No acute osseous findings.   Electronically Signed   By: Jorje Guild M.D.   On: 10/01/2013 01:24    EKG Interpretation   None       MDM   1. Contusion of left hip   2. Contusion of left shoulder   3. Fall    Pt presents with left shoulder pain and left hip pain, decreased ROM secondary to pain. No obvious deformity on exam.  XR- without evidence of fractures. Labs without anemia, or electrolyte abnormality.  Discussed lab results, imaging results, and treatment plan with the patient. Return precautions given. Reports understanding and no other concerns at this time.  Patient is stable for discharge at this time.  Meds given in ED:  Medications  morphine 4 MG/ML injection 4 mg (4 mg Intravenous Given 10/01/13 0107)    Discharge Medication List as of 10/01/2013  2:01 AM        Kenneth Purpura Burnetta Sabin, PA-C 10/01/13 2147

## 2013-10-01 ENCOUNTER — Emergency Department (HOSPITAL_COMMUNITY): Payer: Medicare Other

## 2013-10-01 ENCOUNTER — Encounter: Payer: Self-pay | Admitting: Internal Medicine

## 2013-10-01 ENCOUNTER — Ambulatory Visit: Payer: Medicare Other | Admitting: Internal Medicine

## 2013-10-01 LAB — BASIC METABOLIC PANEL
BUN: 23 mg/dL (ref 6–23)
CO2: 27 mEq/L (ref 19–32)
Calcium: 9.2 mg/dL (ref 8.4–10.5)
Chloride: 104 mEq/L (ref 96–112)
Creatinine, Ser: 0.97 mg/dL (ref 0.50–1.35)
GFR calc Af Amer: >90 mL/min (ref >90)
GFR calc non Af Amer: 89 mL/min — ABNORMAL LOW (ref >90)
Glucose, Bld: 88 mg/dL (ref 70–99)
Potassium: 4.2 mEq/L (ref 3.7–5.3)
Sodium: 140 mEq/L (ref 137–147)

## 2013-10-01 LAB — CBC
HCT: 37.7 % — ABNORMAL LOW (ref 39.0–52.0)
Hemoglobin: 12.3 g/dL — ABNORMAL LOW (ref 13.0–17.0)
MCH: 26.9 pg (ref 26.0–34.0)
MCHC: 32.6 g/dL (ref 30.0–36.0)
MCV: 82.5 fL (ref 78.0–100.0)
Platelets: 448 10*3/uL — ABNORMAL HIGH (ref 150–400)
RBC: 4.57 MIL/uL (ref 4.22–5.81)
RDW: 16.2 % — ABNORMAL HIGH (ref 11.5–15.5)
WBC: 9 10*3/uL (ref 4.0–10.5)

## 2013-10-01 MED ORDER — MORPHINE SULFATE 4 MG/ML IJ SOLN
4.0000 mg | Freq: Once | INTRAMUSCULAR | Status: AC
  Administered 2013-10-01: 4 mg via INTRAVENOUS
  Filled 2013-10-01: qty 1

## 2013-10-01 NOTE — ED Provider Notes (Signed)
Medical screening examination/treatment/procedure(s) were performed by non-physician practitioner and as supervising physician I was immediately available for consultation/collaboration.    Teressa Lower, MD 10/01/13 2303

## 2013-10-01 NOTE — Discharge Instructions (Signed)
Call for a follow up appointment with a Family or Primary Care Provider and an orthopedic surgeon if you continue to have hip and shoulder pain.  Return if Symptoms worsen.   Take medication as prescribed.  Take OTC pain medication for your pain.

## 2013-10-14 ENCOUNTER — Ambulatory Visit: Payer: Medicare Other | Admitting: Internal Medicine

## 2013-10-14 ENCOUNTER — Telehealth: Payer: Self-pay | Admitting: *Deleted

## 2013-10-14 NOTE — Telephone Encounter (Signed)
Pt stated that he had another medical appt and couldn't get to [facility].  Making another appt.

## 2013-11-19 ENCOUNTER — Telehealth: Payer: Self-pay | Admitting: *Deleted

## 2013-11-19 ENCOUNTER — Ambulatory Visit: Payer: Medicare Other | Admitting: Internal Medicine

## 2013-11-19 NOTE — Telephone Encounter (Signed)
Requested pt call for a new appt.

## 2013-11-20 ENCOUNTER — Encounter (HOSPITAL_COMMUNITY): Payer: Self-pay | Admitting: Emergency Medicine

## 2013-11-20 ENCOUNTER — Emergency Department (HOSPITAL_COMMUNITY): Payer: Medicare Other

## 2013-11-20 ENCOUNTER — Emergency Department (HOSPITAL_COMMUNITY)
Admission: EM | Admit: 2013-11-20 | Discharge: 2013-11-20 | Disposition: A | Discharge status: Home / Self Care | Payer: Medicare Other | Attending: Emergency Medicine | Admitting: Emergency Medicine

## 2013-11-20 DIAGNOSIS — R209 Unspecified disturbances of skin sensation: Secondary | ICD-10-CM | POA: Insufficient documentation

## 2013-11-20 DIAGNOSIS — G8929 Other chronic pain: Secondary | ICD-10-CM | POA: Insufficient documentation

## 2013-11-20 DIAGNOSIS — Z79899 Other long term (current) drug therapy: Secondary | ICD-10-CM | POA: Insufficient documentation

## 2013-11-20 DIAGNOSIS — M19019 Primary osteoarthritis, unspecified shoulder: Secondary | ICD-10-CM | POA: Insufficient documentation

## 2013-11-20 DIAGNOSIS — Z87891 Personal history of nicotine dependence: Secondary | ICD-10-CM | POA: Insufficient documentation

## 2013-11-20 DIAGNOSIS — I2510 Atherosclerotic heart disease of native coronary artery without angina pectoris: Secondary | ICD-10-CM | POA: Insufficient documentation

## 2013-11-20 DIAGNOSIS — M549 Dorsalgia, unspecified: Secondary | ICD-10-CM

## 2013-11-20 DIAGNOSIS — Z7982 Long term (current) use of aspirin: Secondary | ICD-10-CM | POA: Insufficient documentation

## 2013-11-20 DIAGNOSIS — Z8739 Personal history of other diseases of the musculoskeletal system and connective tissue: Secondary | ICD-10-CM | POA: Insufficient documentation

## 2013-11-20 DIAGNOSIS — M545 Low back pain, unspecified: Secondary | ICD-10-CM | POA: Insufficient documentation

## 2013-11-20 DIAGNOSIS — Z8661 Personal history of infections of the central nervous system: Secondary | ICD-10-CM | POA: Insufficient documentation

## 2013-11-20 DIAGNOSIS — Z8701 Personal history of pneumonia (recurrent): Secondary | ICD-10-CM | POA: Insufficient documentation

## 2013-11-20 DIAGNOSIS — Z8709 Personal history of other diseases of the respiratory system: Secondary | ICD-10-CM | POA: Insufficient documentation

## 2013-11-20 DIAGNOSIS — M79609 Pain in unspecified limb: Secondary | ICD-10-CM | POA: Insufficient documentation

## 2013-11-20 DIAGNOSIS — I10 Essential (primary) hypertension: Secondary | ICD-10-CM | POA: Insufficient documentation

## 2013-11-20 DIAGNOSIS — Z21 Asymptomatic human immunodeficiency virus [HIV] infection status: Secondary | ICD-10-CM | POA: Insufficient documentation

## 2013-11-20 MED ORDER — DIAZEPAM 5 MG PO TABS
5.0000 mg | ORAL_TABLET | Freq: Once | ORAL | Status: AC
  Administered 2013-11-20: 5 mg via ORAL
  Filled 2013-11-20: qty 1

## 2013-11-20 MED ORDER — HYDROMORPHONE HCL PF 1 MG/ML IJ SOLN: 1.0000 mg | Freq: Once | INTRAMUSCULAR | Status: DC

## 2013-11-20 MED ORDER — HYDROMORPHONE HCL PF 1 MG/ML IJ SOLN
1.0000 mg | Freq: Once | INTRAMUSCULAR | Status: AC
  Administered 2013-11-20: 1 mg via INTRAMUSCULAR
  Filled 2013-11-20: qty 1

## 2013-11-20 NOTE — ED Provider Notes (Signed)
CSN: [id number]     Arrival date & time 11/20/13  1212 History  This chart was scribed for non-physician practitioner, Jamse Mead, PA-C working with Sharyon Cable, MD by Frederich Balding, ED scribe. This patient was seen in room TR09C/TR09C and the patient's care was started at 1:12 PM    Chief Complaint  Patient presents with  . Back Pain   The history is provided by the patient. No language interpreter was used.   HPI Comments: Kenneth [name] is a 58 y.o. male who presents to the Emergency Department complaining of constant, chronic, aching, lower back pain that radiates down his right leg that started 3 days ago after having a stimulator removed. This is his normal chronic pain that started in 1995 after a steel wall crushed his lower back. Pt also has tingling down his right side. The stimulator was removed by [facility] on Monday. He is under a pain contract and was given oxycodone by the clinic. Pt states it has provided little relief. He tried to call the clinic but they told him to come to the ED. His next appointment with pain management is 12/09/13 to get a permanent stimulator. Denies chest pain, SOB, difficulty breathing, nausea, emesis, diarrhea, bowel or bladder incontinence. Pt has a history of four back surgeries. Denies history of IV drug use.   Past Medical History  Diagnosis Date  . HIV positive 2004  . Hypertension   . Meningitis   . Coronary artery disease   . Chest pain at rest 09/23/2012  . Chronic bronchitis     "q year last 5 yr or so" (09/24/2012)  . Exertional dyspnea   . Migraines   . Arthritis     "both shoulders" (09/24/2012)  . Chronic lower back pain   . Pneumonia   . Lumbar disc disease   . Iliac artery aneurysm     Right   Past Surgical History  Procedure Laterality Date  . Intussusception repair  10/2011  . Posterior lumbar fusion  1995  . Elbow surgery  ~ 1997    "removed some stones; right" (09/24/2012)  . Appendectomy  2013  . Back  surgery  2006    4 BACK SURGERIES  . Abdominal surgery    . Spine surgery      Injury to back 1995   Family History  Problem Relation Age of Onset  . Heart disease Father   . Heart disease Brother   . Diabetes Brother    History  Substance Use Topics  . Smoking status: Former Smoker -- 0.10 packs/day for 45 years    Types: Cigarettes    Quit date: 09/24/2012  . Smokeless tobacco: Never Used     Comment: 09/24/2012 "been stopped now ~ 8 month".  3 per day now.  . Alcohol Use: No    Review of Systems  Respiratory: Negative for shortness of breath.   Cardiovascular: Negative for chest pain.  Gastrointestinal: Negative for nausea, vomiting and diarrhea.  Genitourinary:       Negative for bowel or bladder incontinence.  Musculoskeletal: Positive for back pain and myalgias.  Neurological: Positive for numbness.  All other systems reviewed and are negative.   Allergies  Bee venom; Flexeril; Hydrocodone; Nabumetone; [name]; Tramadol; Ace inhibitors; and Other  Home Medications   Current Outpatient Rx  Name  Route  Sig  Dispense  Refill  . acetaminophen (TYLENOL) 500 MG tablet   Oral   Take 1 tablet (500 mg total)  by mouth every 6 (six) hours as needed.   30 tablet   0   . amLODipine (NORVASC) 5 MG tablet   Oral   Take 1 tablet (5 mg total) by mouth daily.   30 tablet   10   . aspirin 81 MG chewable tablet   Oral   Chew 81 mg by mouth daily.          Marland Kitchen elvitegravir-cobicistat-emtricitabine-tenofovir (STRIBILD) 150-150-200-300 MG TABS tablet   Oral   Take 1 tablet by mouth daily with breakfast.   30 tablet   11   . Multiple Vitamin (MULTIVITAMIN WITH MINERALS) TABS   Oral   Take 1 tablet by mouth daily.         . nitroGLYCERIN (NITROSTAT) 0.4 MG SL tablet   Sublingual   Place 0.4 mg under the tongue every 5 (five) minutes as needed for chest pain. Pt hasn't had to use rx         . oxyCODONE-acetaminophen (PERCOCET/ROXICET) 5-325 MG per tablet    Oral   Take 1-2 tablets by mouth every 4 (four) hours as needed for severe pain.         Marland Kitchen tiZANidine (ZANAFLEX) 4 MG tablet   Oral   Take 4 mg by mouth every 6 (six) hours as needed for muscle spasms.         Marland Kitchen EPINEPHrine (EPI-PEN) 0.3 mg/0.3 mL SOAJ   Intramuscular   Inject 0.3 mg into the muscle once as needed (Anaphylaxis).           BP 126/89  Pulse 98  Temp(Src) 97.4 F (36.3 C)  Resp 18  SpO2 96%  Physical Exam  Nursing note and vitals reviewed. Constitutional: He is oriented to person, place, and time. He appears well-developed and well-nourished. No distress.  HENT:  Head: Normocephalic and atraumatic.  Mouth/Throat: Oropharynx is clear and moist. No oropharyngeal exudate.  Eyes: Conjunctivae and EOM are normal. Right eye exhibits no discharge. Left eye exhibits no discharge.  Neck: Normal range of motion. Neck supple. No tracheal deviation present.  Negative neck stiffness Negative nuchal rigidity Negative pain upon palpation to the C-spine  Cardiovascular: Normal rate, regular rhythm and normal heart sounds.   Pulses:      Radial pulses are 2+ on the right side, and 2+ on the left side.       Dorsalis pedis pulses are 2+ on the right side, and 2+ on the left side.  Cap refill less than 3 seconds  Pulmonary/Chest: Effort normal and breath sounds normal. No respiratory distress. He has no wheezes. He has no rhonchi. He has no rales.  Musculoskeletal: Normal range of motion.       Back:  Negative swelling, erythema, inflammation, lesions, sores, deformities noted to the cervical/thoracic/lumbosacral/coccyx region. Discomfort upon palpation to lumbosacral spine upon palpation to mid spinal and paravertebral bilaterally. Scar identified to the lumbar aspect of the spine.  Negative deformities noted to the lower extremities. Full range of motion noted to lower extremities without difficulty-mild discomfort upon motion to the right lower extremity secondary to pain  localized in the lower back.  Lymphadenopathy:    He has no cervical adenopathy.  Neurological: He is alert and oriented to person, place, and time. No cranial nerve deficit. He exhibits normal muscle tone. Coordination normal.  Cranial nerves III-XII grossly intact Strength 5+/5+ to upper and lower extremities bilaterally with resistance applied, equal distribution noted Equal grip strength Sensation intact with differentiation to sharp and  dull touch Negative saddle paresthesias bilaterally  Skin: Skin is warm and dry.  Psychiatric: He has a normal mood and affect. His behavior is normal.    ED Course  Procedures (including critical care time)  DIAGNOSTIC STUDIES: Oxygen Saturation is 96% on RA, normal by my interpretation.    COORDINATION OF CARE: 1:19 PM-Discussed treatment plan which includes pain medication int he ED with pt at bedside and pt agreed to plan. Advised pt he can not be discharged with pain medications since he is under a pain contract.   1:34 PM Patient acknowledged that Dilaudid has helped in the past. Reported that he has been given Dilaudid before without reaction.   2:52 PM-Upon recheck, pt states his pain is starting to worsen again.    Dg Lumbar Spine Complete  11/20/2013   CLINICAL DATA:  Chronic back pain  EXAM: LUMBAR SPINE - COMPLETE 4+ VIEW  COMPARISON:  08/30/2013  FINDINGS: Normal alignment and no fracture. Mild disc degeneration and spurring at L3-4, L4-5, L5-S1. Posterior facet hypertrophy at L3-4, L4-5, L5-S1. Negative for pars defect.  No significant change from the prior study.  Coils in the left gonadal vein are unchanged.  IMPRESSION: Lumbar disc and facet degeneration at the lower 3 levels unchanged. No acute abnormality.   Electronically Signed   By: Franchot Gallo M.D.   On: 11/20/2013 14:24    Labs Review Labs Reviewed - No data to display Imaging Review Dg Lumbar Spine Complete  11/20/2013   CLINICAL DATA:  Chronic back pain  EXAM: LUMBAR  SPINE - COMPLETE 4+ VIEW  COMPARISON:  08/30/2013  FINDINGS: Normal alignment and no fracture. Mild disc degeneration and spurring at L3-4, L4-5, L5-S1. Posterior facet hypertrophy at L3-4, L4-5, L5-S1. Negative for pars defect.  No significant change from the prior study.  Coils in the left gonadal vein are unchanged.  IMPRESSION: Lumbar disc and facet degeneration at the lower 3 levels unchanged. No acute abnormality.   Electronically Signed   By: Franchot Gallo M.D.   On: 11/20/2013 14:24     EKG Interpretation None      MDM   Final diagnoses:  Chronic back pain   Medications  HYDROmorphone (DILAUDID) injection 1 mg (1 mg Intramuscular Given 11/20/13 1332)  diazepam (VALIUM) tablet 5 mg (5 mg Oral Given 11/20/13 1458)    Filed Vitals:   11/20/13 1218 11/20/13 1415 11/20/13 1613  BP: 126/89 130/93 110/70  Pulse: 98 73 76  Temp: 97.4 F (36.3 C) 97.8 F (36.6 C) 98.2 F (36.8 C)  TempSrc:  Oral Oral  Resp: 18 18 18   SpO2: 96% 97% 98%   I personally performed the services described in this documentation, which was scribed in my presence. The recorded information has been reviewed and is accurate.  Patient presenting to the ED with back pain has been ongoing since 1995. Patient reported that he is a patient at [facility] pain management we're a stimulator was placed week ago and removed on Monday. Stated that the pain is gone progressively worse since the removal of the stimulator. Reported that he has a constant throbbing sensation to his back with radiation down his right leg. Reported that the symptoms are very similar into his back pain is had in the past prior to stimulator placement. Reported that he has an appointment with pain management on 12/09/2013. Stated he took one oxycodone this morning with minimal relief. Alert and oriented. GCS 15. Heart rate and rhythm normal. Lungs clear to auscultation  to upper lower lobes bilaterally. Radial and DP pulses 2+ bilaterally. Negative pain  upon palpation to the C-spine. Full range of motion to upper extremities without difficulty or ataxia. Negative swelling, erythema, deformities noted to the lumbosacral spine-discomfort upon palpation to lumbosacral spine mid spinal and paravertebral regions bilaterally. Negative cellulitic findings of incision were stimulator was removed. Full range of motion to lower extremities-mildly decreased to the right lower extremity secondary to pain in the back. Sensation intact with differentiation sharp and dull touch. Strength intact and equal to lower extremities. Plain film of lumbar spine noted lumbar disc space degeneration at the lower 3 levels unchanged-no acute abnormality. This provider spoke with patient regarding imaging. Patient reported that Dilaudid aided in relief of the patient's pain. Doubt cauda equina. Doubt epidural abscess. This appears to be a chronic problem. Patient has been having back pain since 1995. Patient is being seen by pain management specialist and neurosurgery. Patient is due to have a permanent stimulator placed this month. Discussed with patient that all the pain medications and the wall cannot control his pain-he will need to follow pain management and neurosurgery for stimulator placement. Negative acute abnormalities noted. Patient stable, afebrile. Discharged patient. Referred patient to main management specialist. Discussed with patient to avoid any physical or strenuous activity. Discussed with patient to apply warm compressions to aid in relief. Recommended icy hot ointment and massage. Discussed with patient to closely monitor symptoms and if symptoms are to worsen or change to report back to the ED - strict return instructions given.  Patient agreed to plan of care, understood, all questions answered.    Jamse Mead, PA-C 11/21/13 1351

## 2013-11-20 NOTE — ED Notes (Signed)
Pt complaining of back pain radiating down his right leg. sts he had a stimulator removed Monday and pain since

## 2013-11-20 NOTE — Discharge Instructions (Signed)
Please call your doctor for a followup appointment within 24-48 hours. When you talk to your doctor please let them know that you were seen in the emergency department and have them acquire all of your records so that they can discuss the findings with you and formulate a treatment plan to fully care for your new and ongoing problems. Please rest and stay hydrated Please call and follow up with pain management specialist Please avoid any physical or strenuous activity Please apply icy hot ointment and massage Please apply warm compressions to lower back to aid in relief Please continue to monitor symptoms closely if symptoms are to worsen or change (fever greater than 101, chills, chest pain, shortness of breath, difficulty breathing, nausea, vomiting, diarrhea, weakness, numbness, tingling, fall, injury, worsening symptoms, inability to control urine or bowel movements) please report back to the ED immediately  Back Pain, Adult Low back pain is very common. About 1 in 5 people have back pain.The cause of low back pain is rarely dangerous. The pain often gets better over time.About half of people with a sudden onset of back pain feel better in just 2 weeks. About 8 in 10 people feel better by 6 weeks.  CAUSES Some common causes of back pain include:  Strain of the muscles or ligaments supporting the spine.  Wear and tear (degeneration) of the spinal discs.  Arthritis.  Direct injury to the back. DIAGNOSIS Most of the time, the direct cause of low back pain is not known.However, back pain can be treated effectively even when the exact cause of the pain is unknown.Answering your caregiver's questions about your overall health and symptoms is one of the most accurate ways to make sure the cause of your pain is not dangerous. If your caregiver needs more information, he or she may order lab work or imaging tests (X-rays or MRIs).However, even if imaging tests show changes in your back, this  usually does not require surgery. HOME CARE INSTRUCTIONS For many people, back pain returns.Since low back pain is rarely dangerous, it is often a condition that people can learn to [facility] their own.   Remain active. It is stressful on the back to sit or stand in one place. Do not sit, drive, or stand in one place for more than 30 minutes at a time. Take short walks on level surfaces as soon as pain allows.Try to increase the length of time you walk each day.  Do not stay in bed.Resting more than 1 or 2 days can delay your recovery.  Do not avoid exercise or work.Your body is made to move.It is not dangerous to be active, even though your back may hurt.Your back will likely heal faster if you return to being active before your pain is gone.  Pay attention to your body when you bend and lift. Many people have less discomfortwhen lifting if they bend their knees, keep the load close to their bodies,and avoid twisting. Often, the most comfortable positions are those that put less stress on your recovering back.  Find a comfortable position to sleep. Use a firm mattress and lie on your side with your knees slightly bent. If you lie on your back, put a pillow under your knees.  Only take over-the-counter or prescription medicines as directed by your caregiver. Over-the-counter medicines to reduce pain and inflammation are often the most helpful.Your caregiver may prescribe muscle relaxant drugs.These medicines help dull your pain so you can more quickly return to your normal activities and  healthy exercise.  Put ice on the injured area.  Put ice in a plastic bag.  Place a towel between your skin and the bag.  Leave the ice on for 15-20 minutes, 03-04 times a day for the first 2 to 3 days. After that, ice and heat may be alternated to reduce pain and spasms.  Ask your caregiver about trying back exercises and gentle massage. This may be of some benefit.  Avoid feeling anxious or  stressed.Stress increases muscle tension and can worsen back pain.It is important to recognize when you are anxious or stressed and learn ways to manage it.Exercise is a great option. SEEK MEDICAL CARE IF:  You have pain that is not relieved with rest or medicine.  You have pain that does not improve in 1 week.  You have new symptoms.  You are generally not feeling well. SEEK IMMEDIATE MEDICAL CARE IF:   You have pain that radiates from your back into your legs.  You develop new bowel or bladder control problems.  You have unusual weakness or numbness in your arms or legs.  You develop nausea or vomiting.  You develop abdominal pain.  You feel faint. Document Released: 09/04/2005 Document Revised: 03/05/2012 Document Reviewed: 01/23/2011 [facility] Patient Information 2014 [place], [place].   Emergency Department Resource Guide 1) Find a Doctor and Pay Out of Pocket Although you won't have to find out who is covered by your insurance plan, it is a good idea to ask around and get recommendations. You will then need to call the office and see if the doctor you have chosen will accept you as a new patient and what types of options they offer for patients who are self-pay. Some doctors offer discounts or will set up payment plans for their patients who do not have insurance, but you will need to ask so you aren't surprised when you get to your appointment.  2) Contact Your Local Health Department Not all health departments have doctors that can see patients for sick visits, but many do, so it is worth a call to see if yours does. If you don't know where your local health department is, you can check in your phone book. The CDC also has a tool to help you locate your state's health department, and many state websites also have listings of all of their local health departments.  3) Find a [facility] If your illness is not likely to be very severe or complicated, you may want to try a  walk in clinic. These are popping up all over the country in pharmacies, drugstores, and shopping centers. They're usually staffed by nurse practitioners or physician assistants that have been trained to treat common illnesses and complaints. They're usually fairly quick and inexpensive. However, if you have serious medical issues or chronic medical problems, these are probably not your best option.  No Primary Care Doctor: - Call Health Connect at  [phone] - they can help you locate a primary care doctor that  accepts your insurance, provides certain services, etc. - Physician Referral Service- [phone]  Chronic Pain Problems: Organization         Address  Phone   Notes  [facility]  [phone] Patients need to be referred by their primary care doctor.   Medication Assistance: Organization         Address  Phone   Notes  [facility] Medication Assistance Program [address] [place], [place] [address] [phone]  [phone] --Must be a resident of [facility] -- Must have NO insurance coverage whatsoever (no Medicaid/ Medicare, etc.) -- The pt. MUST have a primary care doctor that directs their care regularly and follows them in the community   MedAssist  [phone]   [employer]  [phone]    Agencies that provide inexpensive medical care: Organization         Address  Phone   Notes  [place]  [phone]   [facility]    [phone]   [facility] [place], [place] [address] [phone]   [place] [place] [address], [place] [phone]   Planned Parenthood    [phone]   [facility]    [phone]   [place] and [place] [address], [place] Phone:  [phone], Fax:  [phone] Hours of Operation:  9 am - 6 pm, M-F.  Also accepts Medicaid/Medicare and self-pay.  [facility] for [address], [address], [place] Phone: [phone], Fax: [phone]. Hours of Operation:  8:30 am - 5:30 pm, M-F.  Also accepts Medicaid and self-pay.  [facility] [place] [address] [place] Phone: [phone]   [place], [place], [place] [phone], Ext. [phone] Mondays & Thursdays: 7-9 AM.  First 15 patients are seen on a first come, first serve basis.    [place] Providers:  Organization         Address  Phone   Notes  [facility] [address], [place] [phone] Also accepts self-pay patients.  [facility] [address], [place]  [phone]   [place], [address], [place] [phone]   [facility] [address], [place] [phone]   Lucianne Lei [address], [place]   [phone] Only accepts [place] Access [place] patients after they have their name applied to their card.   Self-Pay (no insurance) in [facility]:  Organization         Address  Phone   Notes  Sickle Cell Patients, [facility] [place] [phone]   [facility] [place] [phone]   [facility] [place]  [place], [place], [place] [phone]   [facility]/Dr. Osei-Bonsu  [address] [place] or [address], [place] [phone] Phone number for both [place] and [place] locations is the same.  [facility] [address] [place] [phone]   [facility] [address], [place] or [address] [phone]   [facility] [address] [place] [phone], phone; [phone], fax Sees  patients 1st and 3rd Saturday of every month.  Must not qualify for public or private insurance (i.e. Medicaid, Medicare, [place] Health Choice, Veterans' Benefits)  Household income should be no more than 200% of the poverty level The clinic cannot treat you if you are pregnant or think you are pregnant  Sexually transmitted diseases are not treated at the clinic.    Dental Care: Organization  Address  Phone  Notes  [facility] [place], [place] [phone] Accepts children up to age 42 who are enrolled in [place] or [place]; pregnant women with a Medicaid card; and children who have applied for Medicaid or [place] Health Choice, but were declined, whose parents can pay a reduced fee at time of service.  [facility] Department of [facility]  [address], [place] [phone] Accepts children up to age 50 who are enrolled in [place] or [place]; pregnant women with a Medicaid card; and children who have applied for Medicaid or [place] Health Choice, but were declined, whose parents can pay a reduced fee at time of service.  [facility] Adult [facility] Access PROGRAM  [place] [phone] Patients are seen by appointment only. Walk-ins are not accepted. [place] will see patients 31 years of age and older. Monday - Tuesday (8am-5pm) Most Wednesdays (8:30-5pm) $30 per visit, cash only  [facility] Adult Dental Access PROGRAM  [address], [facility] [phone] Patients are seen by appointment only. Walk-ins are not accepted. [place] will see patients 24 years of age and older. One Wednesday Evening (Monthly: Volunteer Based).  $30 per visit, cash only  [place]  [phone] for adults; Children under age 35, call Graduate Pediatric Dentistry at [phone]. Children aged 54-14, please call [phone] to request a  pediatric application.  Dental services are provided in all areas of dental care including fillings, crowns and bridges, complete and partial dentures, implants, gum treatment, root canals, and extractions. Preventive care is also provided. Treatment is provided to both adults and children. Patients are selected via a lottery and there is often a waiting list.   [facility] [address], [place]  [phone] [email or web address]   [facility] [address] [place], [place] [phone], Ext. [phone] Second and Fourth Thursday of each month, opens at 6:30 AM; Clinic ends at 9 AM.  Patients are seen on a first-come first-served basis, and a limited number are seen during each clinic.   [facility]  [address] Hillard Danker [place], [place] [phone]   Eligibility Requirements You must have lived in [place], [place], or [place] counties for at least the last three months.   You cannot be eligible for state or federal sponsored [employer], including [employer], [place], or [employer].   You generally cannot be eligible for healthcare insurance through your employer.    How to apply: Eligibility screenings are held every Tuesday and Wednesday afternoon from 1:00 pm until 4:00 pm. You do not need an appointment for the interview!  [facility] [address], [place], [place]   [place]  [place] Department  [place]  [phone]    Behavioral Health Resources in the Community: Intensive Outpatient Programs Organization         Address  Phone  Notes  [place] Wilhoit. [address] [place], [place] [phone]   [facility] [address], [place], [place]   ADS: [facility] [address], [place], [place]   [place] [address]  [place], [place] or [phone]   Substance Abuse Resources  Organization         Address  Phone  Notes  Alcohol and Drug Services  [place]  [phone]   [facility] [place]  [phone]   Chinita Pester  [phone]   Residential & Outpatient Substance Abuse Program  [phone]   Psychological Services Organization         Address  Phone  Notes  [facility] [place]  [place]  [phone]   [facility] [address], [place] or [phone]    Mobile Crisis Teams Organization         Address  Phone  Notes  [facility], Mobile Crisis Care Unit  [phone]   [facility]  [address] [place], [place]   [place] [address], [place] [phone]    Self-Help/Support Groups Organization         Address  Phone             Notes  [place]. of [place] - variety of support groups  [place] Call for more information  Narcotics Anonymous (NA), [facility] [address], [employer] [place]  2 meetings at this location   Special educational needs teacher         Address  Phone  Notes  [facility]idential [facility] [place],    [place]  [phone]   [facility]  [address] [place] [phone], [place], [place]   [place], [place] [phone] Admissions: 8am-3pm M-F  Incentives Substance [place] [address],    [place], [place] [phone]   [facility] [address] [place], [place], [place]   [facility] [address]  [place], [place]   [facility]ight Programs - Intensive Outpatient [address]een [address], [place], [place]   [facility] [place].,    [place], [place] [phone] or [phone]   [facility] (RTS) [address], [place], [place] Accepts Medicaid  [facility] [place] [address],  [place] [phone] Substance Abuse/Addiction Treatment   [facility] Organization         Address  Phone  Notes  [facility]  [phone]   Domenic Schwab, PhD [address] Arlis Porta [place], [place]   [phone] or [phone]   [facility] [place], [place] [phone]   [facility] [address] [place], [place] [phone] Insurance/Medicaid/sponsorship through [facility] and Families [address], [address] [place]                                    [place], [place] [phone] [place] [address], [place] [phone]    Dr. Adele Schilder  [phone]   [facility] Dept. 1) [address],  2) [place] 3)  [place] [address], [place] [phone]  [phone]   [place] [phone] or [phone] (After Hours)

## 2013-11-20 NOTE — ED Notes (Signed)
Pt called NeuroSurg office this am regarding continued pain despite Percocet. Was told to come to ED.

## 2013-11-20 NOTE — ED Notes (Signed)
Pt discharged.Vital signs stable and GCS 15.Pt with chronic back pain and on follow up with pain clinic.Pt still in pain 8/10.Pt to follow up with the pain clinic.

## 2013-11-22 NOTE — ED Provider Notes (Signed)
Medical screening examination/treatment/procedure(s) were performed by non-physician practitioner and as supervising physician I was immediately available for consultation/collaboration.   EKG Interpretation None        Sharyon Cable, MD 11/22/13 1950

## 2013-11-27 ENCOUNTER — Emergency Department (HOSPITAL_COMMUNITY)
Admission: EM | Admit: 2013-11-27 | Discharge: 2013-11-27 | Disposition: A | Discharge status: Home / Self Care | Payer: Medicare Other | Attending: Emergency Medicine | Admitting: Emergency Medicine

## 2013-11-27 ENCOUNTER — Encounter (HOSPITAL_COMMUNITY): Payer: Self-pay | Admitting: Emergency Medicine

## 2013-11-27 DIAGNOSIS — G8929 Other chronic pain: Secondary | ICD-10-CM | POA: Insufficient documentation

## 2013-11-27 DIAGNOSIS — Z9889 Other specified postprocedural states: Secondary | ICD-10-CM | POA: Insufficient documentation

## 2013-11-27 DIAGNOSIS — Z8661 Personal history of infections of the central nervous system: Secondary | ICD-10-CM | POA: Insufficient documentation

## 2013-11-27 DIAGNOSIS — Z21 Asymptomatic human immunodeficiency virus [HIV] infection status: Secondary | ICD-10-CM | POA: Insufficient documentation

## 2013-11-27 DIAGNOSIS — Z87891 Personal history of nicotine dependence: Secondary | ICD-10-CM | POA: Insufficient documentation

## 2013-11-27 DIAGNOSIS — IMO0002 Reserved for concepts with insufficient information to code with codable children: Secondary | ICD-10-CM | POA: Insufficient documentation

## 2013-11-27 DIAGNOSIS — Z79899 Other long term (current) drug therapy: Secondary | ICD-10-CM | POA: Insufficient documentation

## 2013-11-27 DIAGNOSIS — Z8701 Personal history of pneumonia (recurrent): Secondary | ICD-10-CM | POA: Insufficient documentation

## 2013-11-27 DIAGNOSIS — M19019 Primary osteoarthritis, unspecified shoulder: Secondary | ICD-10-CM | POA: Insufficient documentation

## 2013-11-27 DIAGNOSIS — Z7982 Long term (current) use of aspirin: Secondary | ICD-10-CM | POA: Insufficient documentation

## 2013-11-27 DIAGNOSIS — Z8709 Personal history of other diseases of the respiratory system: Secondary | ICD-10-CM | POA: Insufficient documentation

## 2013-11-27 DIAGNOSIS — I10 Essential (primary) hypertension: Secondary | ICD-10-CM | POA: Insufficient documentation

## 2013-11-27 DIAGNOSIS — M549 Dorsalgia, unspecified: Secondary | ICD-10-CM

## 2013-11-27 DIAGNOSIS — M545 Low back pain, unspecified: Secondary | ICD-10-CM | POA: Insufficient documentation

## 2013-11-27 DIAGNOSIS — Z981 Arthrodesis status: Secondary | ICD-10-CM | POA: Insufficient documentation

## 2013-11-27 DIAGNOSIS — I2510 Atherosclerotic heart disease of native coronary artery without angina pectoris: Secondary | ICD-10-CM | POA: Insufficient documentation

## 2013-11-27 MED ORDER — PREDNISONE 20 MG PO TABS
60.0000 mg | ORAL_TABLET | Freq: Once | ORAL | Status: AC
  Administered 2013-11-27: 60 mg via ORAL
  Filled 2013-11-27: qty 3

## 2013-11-27 MED ORDER — PREDNISONE 20 MG PO TABS: 40.0000 mg | ORAL_TABLET | Freq: Every day | ORAL | Status: DC

## 2013-11-27 NOTE — ED Notes (Signed)
Pt was seen here 11/20/13. Was given IM Dilaudid and contact was made with his neurosurgeon. States "what ya'll gave me helped". He received Percocet from the neurosurgeon and says "it is not working".

## 2013-11-27 NOTE — ED Notes (Signed)
EPIC SYSTEM OUT AT TIME OF DISCHARGE. NO SIGNATURE.

## 2013-11-27 NOTE — Discharge Instructions (Signed)
Please follow up with your primary care physician in 1-2 days. If you do not have one please call the [place] number listed above. Please follow up with the neurosurgeon to schedule a follow up appointment. Please read all discharge instructions and return precautions.   Chronic Pain Discharge Instructions  Emergency care providers appreciate that many patients coming to [place] are in severe pain and we wish to address their pain in the safest, most responsible manner.  It is important to recognize however, that the proper treatment of chronic pain differs from that of the pain of injuries and acute illnesses.  Our goal is to provide quality, safe, personalized care and we thank you for giving [place] the opportunity to serve you. The use of narcotics and related agents for chronic pain syndromes may lead to additional physical and psychological problems.  Nearly as many people die from prescription narcotics each year as die from car crashes.  Additionally, this risk is increased if such prescriptions are obtained from a variety of sources.  Therefore, only your primary care physician or a pain management specialist is able to safely treat such syndromes with narcotic medications long-term.    Documentation revealing such prescriptions have been sought from multiple sources may prohibit [place] from providing a refill or different narcotic medication.  Your name may be checked first through the [place].  This database is a record of controlled substance medication prescriptions that the patient has received.  This has been established by [facility] in an effort to eliminate the dangerous, and often life threatening, practice of obtaining multiple prescriptions from different medical providers.   If you have a chronic pain syndrome (i.e. chronic headaches, recurrent back or neck pain, dental pain, abdominal or pelvis pain without a specific  diagnosis, or neuropathic pain such as fibromyalgia) or recurrent visits for the same condition without an acute diagnosis, you may be treated with non-narcotics and other non-addictive medicines.  Allergic reactions or negative side effects that may be reported by a patient to such medications will not typically lead to the use of a narcotic analgesic or other controlled substance as an alternative.   Patients managing chronic pain with a personal physician should have provisions in place for breakthrough pain.  If you are in crisis, you should call your physician.  If your physician directs you to the emergency department, please have the doctor call and speak to our attending physician concerning your care.   When patients come to the Emergency Department (ED) with acute medical conditions in which the Emergency Department physician feels appropriate to prescribe narcotic or sedating pain medication, the physician will prescribe these in very limited quantities.  The amount of these medications will last only until you can see your primary care physician in his/her office.  Any patient who returns to the ED seeking refills should expect only non-narcotic pain medications.   In the event of an acute medical condition exists and the emergency physician feels it is necessary that the patient be given a narcotic or sedating medication -  a responsible adult driver should be present in the room prior to the medication being given by the nurse.   Prescriptions for narcotic or sedating medications that have been lost, stolen or expired will not be refilled in the Emergency Department.    Patients who have chronic pain may receive non-narcotic prescriptions until seen by their primary care physician.  It is every patients  personal responsibility to maintain active prescriptions with his or her primary care physician or specialist.

## 2013-11-27 NOTE — ED Notes (Signed)
Pt reports severe lower back pain. He has chronic back pain. He had a temporary stimulator placed last week for pain that helped and since they removed it the pain has been severe again. Hes having a permanent stimulator placed on Wednesday for the pain.

## 2013-11-27 NOTE — ED Provider Notes (Signed)
CSN: [id number]     Arrival date & time 11/27/13  [id number] History  This chart was scribed for Baron Sane, Duncan working with Nyra Jabs, DO by Roxan Diesel, ED Scribe. This patient was seen in room TR07C/TR07C and the patient's care was started at 9:24 AM.   Chief Complaint  Patient presents with  . Back Pain    The history is provided by the patient. No language interpreter was used.    HPI Comments: Kenneth [name] is a 58 y.o. male with h/o chronic lower back pain (4 back surgeries), lumbar disc disease, and HIV who presents to the Emergency Department complaining of exacerbation of his chronic back pain.  Pt reports he recently had a temporary stimulator placed last week and since it was removed several days ago his chronic lower back pain has worsened to the point that he is unable to walk.  Pain is worsened by bearing weight and coughing.   Pt followed up with his neurosurgeon [facility]) since his exacerbation began and has an appointment to receive a permanent stimulator installment on 3/18.  He takes oxycodone regularly prescribed by his pain management clinic but he states it is not providing relief.  He reports he called them about it but was told he needed to wait until his stimulator placement before making any changes to his pain medication regimen.  Pt denies weakness or numbness in legs, bladder or bowel dysfunction, or night sweats.  He denies h/o cancer.  He reports remote h/o IV drug use but denies any recent use.   Past Medical History  Diagnosis Date  . HIV positive 2004  . Hypertension   . Meningitis   . Coronary artery disease   . Chest pain at rest 09/23/2012  . Chronic bronchitis     "q year last 5 yr or so" (09/24/2012)  . Exertional dyspnea   . Migraines   . Arthritis     "both shoulders" (09/24/2012)  . Chronic lower back pain   . Pneumonia   . Lumbar disc disease   . Iliac artery aneurysm     Right    Past Surgical History  Procedure  Laterality Date  . Intussusception repair  10/2011  . Posterior lumbar fusion  1995  . Elbow surgery  ~ 1997    "removed some stones; right" (09/24/2012)  . Appendectomy  2013  . Back surgery  2006    4 BACK SURGERIES  . Abdominal surgery    . Spine surgery      Injury to back 1995    Family History  Problem Relation Age of Onset  . Heart disease Father   . Heart disease Brother   . Diabetes Brother     History  Substance Use Topics  . Smoking status: Former Smoker -- 0.10 packs/day for 45 years    Types: Cigarettes    Quit date: 09/24/2012  . Smokeless tobacco: Never Used     Comment: 09/24/2012 "been stopped now ~ 8 month".  3 per day now.  . Alcohol Use: No     Review of Systems  HENT: Negative for rhinorrhea.   Gastrointestinal: Negative for diarrhea.  Genitourinary: Negative for enuresis.  Musculoskeletal: Positive for back pain.  Neurological: Negative for weakness and numbness.  All other systems reviewed and are negative.      Allergies  Bee venom; Flexeril; Hydrocodone; Nabumetone; [name]; Tramadol; Ace inhibitors; and Other  Home Medications   Current Outpatient Rx  Name  Route  Sig  Dispense  Refill  . acetaminophen (TYLENOL) 500 MG tablet   Oral   Take 1 tablet (500 mg total) by mouth every 6 (six) hours as needed.   30 tablet   0   . amLODipine (NORVASC) 5 MG tablet   Oral   Take 1 tablet (5 mg total) by mouth daily.   30 tablet   10   . aspirin 81 MG chewable tablet   Oral   Chew 81 mg by mouth daily.          Marland Kitchen elvitegravir-cobicistat-emtricitabine-tenofovir (STRIBILD) 150-150-200-300 MG TABS tablet   Oral   Take 1 tablet by mouth daily with breakfast.   30 tablet   11   . EPINEPHrine (EPI-PEN) 0.3 mg/0.3 mL SOAJ   Intramuscular   Inject 0.3 mg into the muscle once as needed (Anaphylaxis).          . Multiple Vitamin (MULTIVITAMIN WITH MINERALS) TABS   Oral   Take 1 tablet by mouth daily.         . nitroGLYCERIN  (NITROSTAT) 0.4 MG SL tablet   Sublingual   Place 0.4 mg under the tongue every 5 (five) minutes as needed for chest pain. Pt hasn't had to use rx         . oxyCODONE-acetaminophen (PERCOCET/ROXICET) 5-325 MG per tablet   Oral   Take 1-2 tablets by mouth every 4 (four) hours as needed for severe pain.         Marland Kitchen tiZANidine (ZANAFLEX) 4 MG tablet   Oral   Take 4 mg by mouth every 6 (six) hours as needed for muscle spasms.         . predniSONE (DELTASONE) 20 MG tablet   Oral   Take 2 tablets (40 mg total) by mouth daily.   10 tablet   0    BP 147/100  Pulse 79  Temp(Src) 98.6 F (37 C) (Oral)  Resp 20  SpO2 97%  Physical Exam  Nursing note and vitals reviewed. Constitutional: He is oriented to person, place, and time. He appears well-developed and well-nourished. No distress.  HENT:  Head: Normocephalic and atraumatic.  Right Ear: External ear normal.  Left Ear: External ear normal.  Nose: Nose normal.  Mouth/Throat: Oropharynx is clear and moist. No oropharyngeal exudate.  Eyes: Conjunctivae and EOM are normal. Pupils are equal, round, and reactive to light.  Neck: Normal range of motion. Neck supple.  Cardiovascular: Normal rate, regular rhythm, normal heart sounds and intact distal pulses.   Pulmonary/Chest: Effort normal and breath sounds normal. No respiratory distress.  Abdominal: Soft. There is no tenderness.  Musculoskeletal:  Pt in a back brace.  Neurological: He is alert and oriented to person, place, and time. He has normal strength. No cranial nerve deficit or sensory deficit. Gait normal. GCS eye subscore is 4. GCS verbal subscore is 5. GCS motor subscore is 6.  No pronator drift. Bilateral heel-knee-shin intact.  Skin: Skin is warm and dry. He is not diaphoretic.    ED Course  Procedures (including critical care time) Medications  predniSONE (DELTASONE) tablet 60 mg (60 mg Oral Given 11/27/13 0934)     DIAGNOSTIC STUDIES: Oxygen Saturation is 97%  on room air, normal by my interpretation.    COORDINATION OF CARE: 9:32 AM-Discussed treatment plan which includes prednisone and f/u with pain management clinic with pt at bedside and pt agreed to plan.     Labs Review Labs Reviewed - No data to display  Imaging Review No results found.   EKG Interpretation None      MDM   Final diagnoses:  Chronic back pain    Filed Vitals:   11/27/13 0900  BP: 147/100  Pulse: 79  Temp: 98.6 F (37 C)  Resp: 20    Afebrile, NAD, non-toxic appearing, AAOx4.   Patient with back pain.  No neurological deficits and normal neuro exam.  Patient can walk but states is painful.  No loss of bowel or bladder control.  No concern for cauda equina.  No fever, night sweats, weight loss, h/o cancer, IVDU.  RICE protocol and pain medicine indicated and discussed with patient.   I have reviewed records of multiple ED visits with similar or other pain related complaints, usually with negative workups.  I feel that the pt's pain is chronic and cannot appropriately or safely treated in an emergency department setting.  I do not feel that providing narcotic pain medication is in this pt's best interest.  I have urged the pt to follow up closely with their PMD or pain specialist.  I have explicitly discussed with the pt return precautions and have reassured him that he can always be seen and evaluated in the emergency department for any condition that he feels is emergent, and that he will be given treatment as the emergency physician feels is appropriate and safe, but this may not involve the use of narcotic pain medications.  The pt was given the opportunity to voice any further questions or concerns and these were addressed to the best of my ability.  Resource list on Affordable Care Act (Pt has no insurance and no PCP) given. Discussed chronic pain management and that the ED is not designed to treat chronic pain. Provided resource list that includes the  number for Pain Management as well as information on ED chronic pain management policy. Will give pt prednisone here today to relieve pain and prescribe prednisone for home, no NSAIDs given allergy. Discussed reasons to seek immediate care. Patient expresses understanding and agrees with plan.    I personally performed the services described in this documentation, which was scribed in my presence. The recorded information has been reviewed and is accurate.     Harlow Mares, PA-C 11/27/13 1135

## 2013-11-27 NOTE — ED Provider Notes (Signed)
Medical screening examination/treatment/procedure(s) were performed by non-physician practitioner and as supervising physician I was immediately available for consultation/collaboration.   EKG Interpretation None        Delice Bison Tanna Loeffler, DO 11/27/13 1634

## 2013-11-30 ENCOUNTER — Emergency Department (HOSPITAL_COMMUNITY)
Admission: EM | Admit: 2013-11-30 | Discharge: 2013-11-30 | Disposition: A | Discharge status: Home / Self Care | Payer: Medicare Other | Attending: Emergency Medicine | Admitting: Emergency Medicine

## 2013-11-30 ENCOUNTER — Encounter (HOSPITAL_COMMUNITY): Payer: Self-pay | Admitting: Emergency Medicine

## 2013-11-30 DIAGNOSIS — Z87891 Personal history of nicotine dependence: Secondary | ICD-10-CM | POA: Insufficient documentation

## 2013-11-30 DIAGNOSIS — I2510 Atherosclerotic heart disease of native coronary artery without angina pectoris: Secondary | ICD-10-CM | POA: Insufficient documentation

## 2013-11-30 DIAGNOSIS — Z8701 Personal history of pneumonia (recurrent): Secondary | ICD-10-CM | POA: Insufficient documentation

## 2013-11-30 DIAGNOSIS — Z8669 Personal history of other diseases of the nervous system and sense organs: Secondary | ICD-10-CM | POA: Insufficient documentation

## 2013-11-30 DIAGNOSIS — Z9889 Other specified postprocedural states: Secondary | ICD-10-CM | POA: Insufficient documentation

## 2013-11-30 DIAGNOSIS — Z21 Asymptomatic human immunodeficiency virus [HIV] infection status: Secondary | ICD-10-CM | POA: Insufficient documentation

## 2013-11-30 DIAGNOSIS — Z79899 Other long term (current) drug therapy: Secondary | ICD-10-CM | POA: Insufficient documentation

## 2013-11-30 DIAGNOSIS — M19019 Primary osteoarthritis, unspecified shoulder: Secondary | ICD-10-CM | POA: Insufficient documentation

## 2013-11-30 DIAGNOSIS — Z8709 Personal history of other diseases of the respiratory system: Secondary | ICD-10-CM | POA: Insufficient documentation

## 2013-11-30 DIAGNOSIS — Z7982 Long term (current) use of aspirin: Secondary | ICD-10-CM | POA: Insufficient documentation

## 2013-11-30 DIAGNOSIS — M549 Dorsalgia, unspecified: Secondary | ICD-10-CM | POA: Insufficient documentation

## 2013-11-30 DIAGNOSIS — G8929 Other chronic pain: Secondary | ICD-10-CM

## 2013-11-30 DIAGNOSIS — IMO0002 Reserved for concepts with insufficient information to code with codable children: Secondary | ICD-10-CM | POA: Insufficient documentation

## 2013-11-30 DIAGNOSIS — I10 Essential (primary) hypertension: Secondary | ICD-10-CM | POA: Insufficient documentation

## 2013-11-30 MED ORDER — HYDROMORPHONE HCL PF 1 MG/ML IJ SOLN
1.0000 mg | Freq: Once | INTRAMUSCULAR | Status: AC
  Administered 2013-11-30: 1 mg via INTRAMUSCULAR
  Filled 2013-11-30: qty 1

## 2013-11-30 NOTE — ED Notes (Signed)
He c/o low back pain.  He states he has had 4 back surgeries; and had a recent "stimulator" applied and subsequently removed.  He states he has an impending app't. With [facility] for implantation of a permanent stimulator, "but the pain is so bad I had to come to the hospital".  He is in no distress and is here with his wife.

## 2013-11-30 NOTE — ED Provider Notes (Signed)
Medical screening examination/treatment/procedure(s) were performed by non-physician practitioner and as supervising physician I was immediately available for consultation/collaboration.   EKG Interpretation None        Wandra Arthurs, MD 11/30/13 2102

## 2013-11-30 NOTE — ED Provider Notes (Signed)
CSN: [id number]     Arrival date & time 11/30/13  1714 History   First MD Initiated Contact with Patient 11/30/13 1724     Chief Complaint  Patient presents with  . Back Pain     (Consider location/radiation/quality/duration/timing/severity/associated sxs/prior Treatment) HPI Comments: Patient presents to the ED with a chief complaint of back pain.  Patient has a history of chronic back pain. He recently had a trial implantable stimulator placed approximately 2-3 weeks ago. He experienced good relief, and the temporary stimulator was removed. He is scheduled to have a permanent stimulator placed in the coming weeks. He states that in the meantime his pain medicine is not alleviating his symptoms. He states that his pain is 10 out of 10. Nothing makes it better or worse. He denies any bowel or bladder incontinence. Denies any saddle anesthesia. Denies fevers. Denies any ataxia. He uses a back brace in addition to the pain medicine. He is also followed by chronic pain management, who told him that they're not going to adjust his medications until he has the stimulator placed.  The history is provided by the patient. No language interpreter was used.    Past Medical History  Diagnosis Date  . HIV positive 2004  . Hypertension   . Meningitis   . Coronary artery disease   . Chest pain at rest 09/23/2012  . Chronic bronchitis     "q year last 5 yr or so" (09/24/2012)  . Exertional dyspnea   . Migraines   . Arthritis     "both shoulders" (09/24/2012)  . Chronic lower back pain   . Pneumonia   . Lumbar disc disease   . Iliac artery aneurysm     Right   Past Surgical History  Procedure Laterality Date  . Intussusception repair  10/2011  . Posterior lumbar fusion  1995  . Elbow surgery  ~ 1997    "removed some stones; right" (09/24/2012)  . Appendectomy  2013  . Back surgery  2006    4 BACK SURGERIES  . Abdominal surgery    . Spine surgery      Injury to back 1995   Family History   Problem Relation Age of Onset  . Heart disease Father   . Heart disease Brother   . Diabetes Brother    History  Substance Use Topics  . Smoking status: Former Smoker -- 0.10 packs/day for 45 years    Types: Cigarettes    Quit date: 09/24/2012  . Smokeless tobacco: Never Used     Comment: 09/24/2012 "been stopped now ~ 8 month".  3 per day now.  . Alcohol Use: No    Review of Systems  Constitutional: Negative for fever and chills.  Gastrointestinal:       No bowel incontinence  Genitourinary:       No urinary incontinence  Musculoskeletal: Positive for arthralgias, back pain and myalgias.  Neurological:       No saddle anesthesia      Allergies  Bee venom; Flexeril; Hydrocodone; Nabumetone; [name]; Tramadol; Ace inhibitors; and Other  Home Medications   Current Outpatient Rx  Name  Route  Sig  Dispense  Refill  . acetaminophen (TYLENOL) 500 MG tablet   Oral   Take 1 tablet (500 mg total) by mouth every 6 (six) hours as needed.   30 tablet   0   . amLODipine (NORVASC) 5 MG tablet   Oral   Take 1 tablet (5 mg total) by mouth  daily.   30 tablet   10   . aspirin 81 MG chewable tablet   Oral   Chew 81 mg by mouth daily.          Marland Kitchen elvitegravir-cobicistat-emtricitabine-tenofovir (STRIBILD) 150-150-200-300 MG TABS tablet   Oral   Take 1 tablet by mouth daily with breakfast.   30 tablet   11   . EPINEPHrine (EPI-PEN) 0.3 mg/0.3 mL SOAJ   Intramuscular   Inject 0.3 mg into the muscle once as needed (Anaphylaxis).          . Multiple Vitamin (MULTIVITAMIN WITH MINERALS) TABS   Oral   Take 1 tablet by mouth daily.         . nitroGLYCERIN (NITROSTAT) 0.4 MG SL tablet   Sublingual   Place 0.4 mg under the tongue every 5 (five) minutes as needed for chest pain. Pt hasn't had to use rx         . oxyCODONE-acetaminophen (PERCOCET/ROXICET) 5-325 MG per tablet   Oral   Take 1-2 tablets by mouth every 4 (four) hours as needed for severe pain.          . predniSONE (DELTASONE) 20 MG tablet   Oral   Take 2 tablets (40 mg total) by mouth daily.   10 tablet   0   . tiZANidine (ZANAFLEX) 4 MG tablet   Oral   Take 4 mg by mouth every 6 (six) hours as needed for muscle spasms.          BP 146/103  Pulse 89  Temp(Src) 98.1 F (36.7 C) (Oral)  Resp 14  SpO2 96% Physical Exam  Nursing note and vitals reviewed. Constitutional: He is oriented to person, place, and time. He appears well-developed and well-nourished. No distress.  HENT:  Head: Normocephalic and atraumatic.  Eyes: Conjunctivae and EOM are normal. Right eye exhibits no discharge. Left eye exhibits no discharge. No scleral icterus.  Neck: Normal range of motion. Neck supple. No tracheal deviation present.  Cardiovascular: Normal rate, regular rhythm and normal heart sounds.  Exam reveals no gallop and no friction rub.   No murmur heard. Pulmonary/Chest: Effort normal and breath sounds normal. No respiratory distress. He has no wheezes.  Abdominal: Soft. He exhibits no distension. There is no tenderness.  Musculoskeletal: Normal range of motion.  Lumbar paraspinal muscles tender to palpation, there are a couple of prior scars, no evidence of erythema, cellulitis, or abscess, no bony tenderness, step-offs, or gross abnormality or deformity of spine, patient is able to ambulate, moves all extremities  Bilateral great toe extension intact Bilateral plantar/dorsiflexion intact  Neurological: He is alert and oriented to person, place, and time. He has normal reflexes.  Sensation and strength intact bilaterally Symmetrical reflexes  Skin: Skin is warm. He is not diaphoretic.  Psychiatric: He has a normal mood and affect. His behavior is normal. Judgment and thought content normal.    ED Course  Procedures (including critical care time) Labs Review Labs Reviewed - No data to display Imaging Review No results found.   EKG Interpretation None      MDM   Final  diagnoses:  Back pain  Chronic pain    Patient with back pain.  No neurological deficits and normal neuro exam.  Patient is ambulatory.  No loss of bowel or bladder control.  Doubt cauda equina.  Denies fever,  doubt epidural abscess or other lesion. Recommend back exercises, stretching, RICE.  6:52 PM Patient states his pain is improved. Will  discharge to home. He has followup on Wednesday. He is stable and ready for discharge.       Montine Circle, PA-C 11/30/13 [phone]

## 2013-11-30 NOTE — Discharge Instructions (Signed)
Back Pain, Adult Low back pain is very common. About 1 in 5 people have back pain.The cause of low back pain is rarely dangerous. The pain often gets better over time.About half of people with a sudden onset of back pain feel better in just 2 weeks. About 8 in 10 people feel better by 6 weeks.  CAUSES Some common causes of back pain include:  Strain of the muscles or ligaments supporting the spine.  Wear and tear (degeneration) of the spinal discs.  Arthritis.  Direct injury to the back. DIAGNOSIS Most of the time, the direct cause of low back pain is not known.However, back pain can be treated effectively even when the exact cause of the pain is unknown.Answering your caregiver's questions about your overall health and symptoms is one of the most accurate ways to make sure the cause of your pain is not dangerous. If your caregiver needs more information, he or she may order lab work or imaging tests (X-rays or MRIs).However, even if imaging tests show changes in your back, this usually does not require surgery. HOME CARE INSTRUCTIONS For many people, back pain returns.Since low back pain is rarely dangerous, it is often a condition that people can learn to manageon their own.   Remain active. It is stressful on the back to sit or stand in one place. Do not sit, drive, or stand in one place for more than 30 minutes at a time. Take short walks on level surfaces as soon as pain allows.Try to increase the length of time you walk each day.  Do not stay in bed.Resting more than 1 or 2 days can delay your recovery.  Do not avoid exercise or work.Your body is made to move.It is not dangerous to be active, even though your back may hurt.Your back will likely heal faster if you return to being active before your pain is gone.  Pay attention to your body when you bend and lift. Many people have less discomfortwhen lifting if they bend their knees, keep the load close to their bodies,and  avoid twisting. Often, the most comfortable positions are those that put less stress on your recovering back.  Find a comfortable position to sleep. Use a firm mattress and lie on your side with your knees slightly bent. If you lie on your back, put a pillow under your knees.  Only take over-the-counter or prescription medicines as directed by your caregiver. Over-the-counter medicines to reduce pain and inflammation are often the most helpful.Your caregiver may prescribe muscle relaxant drugs.These medicines help dull your pain so you can more quickly return to your normal activities and healthy exercise.  Put ice on the injured area.  Put ice in a plastic bag.  Place a towel between your skin and the bag.  Leave the ice on for 15-20 minutes, 03-04 times a day for the first 2 to 3 days. After that, ice and heat may be alternated to reduce pain and spasms.  Ask your caregiver about trying back exercises and gentle massage. This may be of some benefit.  Avoid feeling anxious or stressed.Stress increases muscle tension and can worsen back pain.It is important to recognize when you are anxious or stressed and learn ways to manage it.Exercise is a great option. SEEK MEDICAL CARE IF:  You have pain that is not relieved with rest or medicine.  You have pain that does not improve in 1 week.  You have new symptoms.  You are generally not feeling well. SEEK   IMMEDIATE MEDICAL CARE IF:   You have pain that radiates from your back into your legs.  You develop new bowel or bladder control problems.  You have unusual weakness or numbness in your arms or legs.  You develop nausea or vomiting.  You develop abdominal pain.  You feel faint. Document Released: 09/04/2005 Document Revised: 03/05/2012 Document Reviewed: 01/23/2011 [employer] Patient Information 2014 [employer].  

## 2013-12-03 ENCOUNTER — Telehealth: Payer: Self-pay | Admitting: *Deleted

## 2013-12-03 NOTE — Telephone Encounter (Signed)
Detectable VL, pt needing follow-up appt.  Appt made for 12/15/13 @ 3:45 PM.

## 2013-12-04 ENCOUNTER — Encounter (HOSPITAL_COMMUNITY): Payer: Self-pay | Admitting: Emergency Medicine

## 2013-12-04 ENCOUNTER — Emergency Department (HOSPITAL_COMMUNITY)
Admission: EM | Admit: 2013-12-04 | Discharge: 2013-12-04 | Disposition: A | Discharge status: Home / Self Care | Payer: Medicare Other | Attending: Emergency Medicine | Admitting: Emergency Medicine

## 2013-12-04 DIAGNOSIS — G8929 Other chronic pain: Secondary | ICD-10-CM | POA: Insufficient documentation

## 2013-12-04 DIAGNOSIS — M545 Low back pain, unspecified: Secondary | ICD-10-CM | POA: Insufficient documentation

## 2013-12-04 DIAGNOSIS — M549 Dorsalgia, unspecified: Secondary | ICD-10-CM

## 2013-12-04 DIAGNOSIS — M19019 Primary osteoarthritis, unspecified shoulder: Secondary | ICD-10-CM | POA: Insufficient documentation

## 2013-12-04 DIAGNOSIS — Z87891 Personal history of nicotine dependence: Secondary | ICD-10-CM | POA: Insufficient documentation

## 2013-12-04 DIAGNOSIS — Z8739 Personal history of other diseases of the musculoskeletal system and connective tissue: Secondary | ICD-10-CM | POA: Insufficient documentation

## 2013-12-04 DIAGNOSIS — Z7982 Long term (current) use of aspirin: Secondary | ICD-10-CM | POA: Insufficient documentation

## 2013-12-04 DIAGNOSIS — I2510 Atherosclerotic heart disease of native coronary artery without angina pectoris: Secondary | ICD-10-CM | POA: Insufficient documentation

## 2013-12-04 DIAGNOSIS — Z21 Asymptomatic human immunodeficiency virus [HIV] infection status: Secondary | ICD-10-CM | POA: Insufficient documentation

## 2013-12-04 DIAGNOSIS — Z8701 Personal history of pneumonia (recurrent): Secondary | ICD-10-CM | POA: Insufficient documentation

## 2013-12-04 DIAGNOSIS — I10 Essential (primary) hypertension: Secondary | ICD-10-CM | POA: Insufficient documentation

## 2013-12-04 DIAGNOSIS — Z79899 Other long term (current) drug therapy: Secondary | ICD-10-CM | POA: Insufficient documentation

## 2013-12-04 DIAGNOSIS — Z8709 Personal history of other diseases of the respiratory system: Secondary | ICD-10-CM | POA: Insufficient documentation

## 2013-12-04 MED ORDER — METHOCARBAMOL 500 MG PO TABS
500.0000 mg | ORAL_TABLET | Freq: Once | ORAL | Status: AC
  Administered 2013-12-04: 500 mg via ORAL
  Filled 2013-12-04: qty 1

## 2013-12-04 NOTE — ED Notes (Signed)
Was seen at [facility] for same back pain 4 days ago still in pain no new injury pt able to ambulate well and upright

## 2013-12-04 NOTE — ED Provider Notes (Signed)
CSN: [id number]     Arrival date & time 12/04/13  1246 History  This chart was scribed for non-physician practitioner, Quincy Carnes, PA-C working with Jasper Riling. Alvino Chapel, MD by Frederich Balding, ED scribe. This patient was seen in room TR07C/TR07C and the patient's care was started at 1:06 PM.   Chief Complaint  Patient presents with  . Back Pain   The history is provided by the patient. No language interpreter was used.   HPI Comments: Kenneth [name] is a 58 y.o. male who presents to the Emergency Department complaining of chronic lower back pain. Denies any new injuries. Pt was evaluated at [facility] for the same 4 days ago. He states he has an appointment with Dr. Ishmael Holter in one week to get his permanent spinal stimulator put in. Pt has been taking his prescribed percocet and wearing a back brace with little relief. He tried to call Dr. Ishmael Holter but he states he was told to just come to the ED.  Patient denies any numbness or paresthesias of his extremities. He denies any loss of bowel or bladder control.  Past Medical History  Diagnosis Date  . HIV positive 2004  . Hypertension   . Meningitis   . Coronary artery disease   . Chest pain at rest 09/23/2012  . Chronic bronchitis     "q year last 5 yr or so" (09/24/2012)  . Exertional dyspnea   . Migraines   . Arthritis     "both shoulders" (09/24/2012)  . Chronic lower back pain   . Pneumonia   . Lumbar disc disease   . Iliac artery aneurysm     Right   Past Surgical History  Procedure Laterality Date  . Intussusception repair  10/2011  . Posterior lumbar fusion  1995  . Elbow surgery  ~ 1997    "removed some stones; right" (09/24/2012)  . Appendectomy  2013  . Back surgery  2006    4 BACK SURGERIES  . Abdominal surgery    . Spine surgery      Injury to back 1995   Family History  Problem Relation Age of Onset  . Heart disease Father   . Heart disease Brother   . Diabetes Brother    History  Substance Use Topics  . Smoking status:  Former Smoker -- 0.10 packs/day for 45 years    Types: Cigarettes    Quit date: 09/24/2012  . Smokeless tobacco: Never Used     Comment: 09/24/2012 "been stopped now ~ 8 month".  3 per day now.  . Alcohol Use: No    Review of Systems  Musculoskeletal: Positive for back pain.  All other systems reviewed and are negative.   Allergies  Bee venom; Flexeril; Hydrocodone; Nabumetone; [name]; Tramadol; Ace inhibitors; and Other  Home Medications   Current Outpatient Rx  Name  Route  Sig  Dispense  Refill  . amLODipine (NORVASC) 5 MG tablet   Oral   Take 1 tablet (5 mg total) by mouth daily.   30 tablet   10   . aspirin 81 MG chewable tablet   Oral   Chew 81 mg by mouth daily.          Marland Kitchen elvitegravir-cobicistat-emtricitabine-tenofovir (STRIBILD) 150-150-200-300 MG TABS tablet   Oral   Take 1 tablet by mouth daily with breakfast.   30 tablet   11   . EPINEPHrine (EPI-PEN) 0.3 mg/0.3 mL SOAJ   Intramuscular   Inject 0.3 mg into the muscle once as  needed (Anaphylaxis).          . Multiple Vitamin (MULTIVITAMIN WITH MINERALS) TABS   Oral   Take 1 tablet by mouth daily.         . nitroGLYCERIN (NITROSTAT) 0.4 MG SL tablet   Sublingual   Place 0.4 mg under the tongue every 5 (five) minutes as needed for chest pain. Pt hasn't had to use rx         . oxyCODONE-acetaminophen (PERCOCET/ROXICET) 5-325 MG per tablet   Oral   Take 1-2 tablets by mouth every 8 (eight) hours as needed for moderate pain or severe pain.          . predniSONE (DELTASONE) 20 MG tablet   Oral   Take 2 tablets (40 mg total) by mouth daily.   10 tablet   0   . tiZANidine (ZANAFLEX) 4 MG tablet   Oral   Take 4 mg by mouth every 6 (six) hours as needed for muscle spasms.          BP 158/110  Pulse 93  Temp(Src) 97.9 F (36.6 C) (Oral)  Resp 22  Ht 5\' 9"  (1.753 m)  Wt 160 lb 3.2 oz (72.666 kg)  BMI 23.65 kg/m2  SpO2 97%  Physical Exam  Nursing note and vitals  reviewed. Constitutional: He is oriented to person, place, and time. He appears well-developed and well-nourished. No distress.  HENT:  Head: Normocephalic and atraumatic.  Mouth/Throat: Oropharynx is clear and moist.  Eyes: Conjunctivae and EOM are normal. Pupils are equal, round, and reactive to light.  Neck: Normal range of motion. Neck supple.  Cardiovascular: Normal rate, regular rhythm and normal heart sounds.   Pulmonary/Chest: Effort normal and breath sounds normal.  Musculoskeletal: Normal range of motion. He exhibits no edema.       Lumbar back: He exhibits tenderness, bony tenderness and pain.  Tenderness to palpation of lumbar spine. No gross deformities noted.  Limited range of motion secondary to pain.  Distal sensation intact. He really unassisted without difficulty  Neurological: He is alert and oriented to person, place, and time.  Skin: Skin is warm and dry. He is not diaphoretic.  Psychiatric: He has a normal mood and affect.    ED Course  Procedures (including critical care time)  DIAGNOSTIC STUDIES: Oxygen Saturation is 97% on RA, normal by my interpretation.    COORDINATION OF CARE: 1:07 PM-Discussed treatment plan which includes pain medication with pt at bedside and pt agreed to plan.   Labs Review Labs Reviewed - No data to display Imaging Review No results found.   EKG Interpretation None      MDM   Final diagnoses:  Chronic back pain   Patient well known to emergency department for his chronic back pain. On exam today he has no neurologic deficits and no signs or symptoms concerning for cauda equina. Patient receives monthly prescriptions of narcotics from another provider, I will not give him further narcotics in the emergency department today. He was given a single dose of robaxin and discharged. He is instructed to followup with his neurosurgeon next week as previously scheduled.  I personally performed the services described in this  documentation, which was scribed in my presence. The recorded information has been reviewed and is accurate.  Larene Pickett, PA-C 12/04/13 1342

## 2013-12-04 NOTE — ED Notes (Signed)
Pt states he has an appointment with NS on 3/27. States was told to come to ED by NS for pain control.

## 2013-12-04 NOTE — Discharge Instructions (Signed)
Continue taking your percocet. Follow up with neurosurgery next week as previously scheduled.

## 2013-12-05 NOTE — ED Provider Notes (Signed)
Medical screening examination/treatment/procedure(s) were performed by non-physician practitioner and as supervising physician I was immediately available for consultation/collaboration.   EKG Interpretation None       Jasper Riling. Alvino Chapel, MD 12/05/13 [phone]

## 2013-12-15 ENCOUNTER — Ambulatory Visit (INDEPENDENT_AMBULATORY_CARE_PROVIDER_SITE_OTHER): Payer: Medicare Other | Admitting: Internal Medicine

## 2013-12-15 ENCOUNTER — Encounter: Payer: Self-pay | Admitting: Internal Medicine

## 2013-12-15 ENCOUNTER — Other Ambulatory Visit: Payer: Self-pay | Admitting: Anesthesiology

## 2013-12-15 VITALS — BP 168/115 | HR 69 | Temp 98.0°F | Wt 155.0 lb

## 2013-12-15 DIAGNOSIS — Z113 Encounter for screening for infections with a predominantly sexual mode of transmission: Secondary | ICD-10-CM

## 2013-12-15 DIAGNOSIS — Z21 Asymptomatic human immunodeficiency virus [HIV] infection status: Secondary | ICD-10-CM

## 2013-12-15 DIAGNOSIS — Z79899 Other long term (current) drug therapy: Secondary | ICD-10-CM

## 2013-12-15 DIAGNOSIS — B20 Human immunodeficiency virus [HIV] disease: Secondary | ICD-10-CM
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
## 2013-12-16 ENCOUNTER — Encounter (HOSPITAL_COMMUNITY): Payer: Self-pay

## 2013-12-16 ENCOUNTER — Encounter (HOSPITAL_COMMUNITY)
Admission: RE | Admit: 2013-12-16 | Discharge: 2013-12-16 | Disposition: A | Discharge status: Home / Self Care | Payer: Medicare Other | Source: Ambulatory Visit | Attending: Anesthesiology | Admitting: Anesthesiology

## 2013-12-16 LAB — COMPREHENSIVE METABOLIC PANEL
ALT: 8 U/L (ref 0–53)
AST: 14 U/L (ref 0–37)
Albumin: 4 g/dL (ref 3.5–5.2)
Alkaline Phosphatase: 99 U/L (ref 39–117)
BUN: 15 mg/dL (ref 6–23)
CO2: 26 mEq/L (ref 19–32)
Calcium: 9.4 mg/dL (ref 8.4–10.5)
Chloride: 103 mEq/L (ref 96–112)
Creat: 0.96 mg/dL (ref 0.50–1.35)
Glucose, Bld: 81 mg/dL (ref 70–99)
Potassium: 4.4 mEq/L (ref 3.5–5.3)
Sodium: 137 mEq/L (ref 135–145)
Total Bilirubin: 0.3 mg/dL (ref 0.2–1.2)
Total Protein: 7.9 g/dL (ref 6.0–8.3)

## 2013-12-16 LAB — CBC
HCT: 44.5 % (ref 39.0–52.0)
HCT: 44.5 % (ref 39.0–52.0)
Hemoglobin: 14.6 g/dL (ref 13.0–17.0)
Hemoglobin: 14.6 g/dL (ref 13.0–17.0)
MCH: 27 pg (ref 26.0–34.0)
MCH: 27.2 pg (ref 26.0–34.0)
MCHC: 32.8 g/dL (ref 30.0–36.0)
MCHC: 32.8 g/dL (ref 30.0–36.0)
MCV: 82.3 fL (ref 78.0–100.0)
MCV: 83 fL (ref 78.0–100.0)
Platelets: 323 10*3/uL (ref 150–400)
Platelets: 300 10*3/uL (ref 150–400)
RBC: 5.41 MIL/uL (ref 4.22–5.81)
RBC: 5.36 MIL/uL (ref 4.22–5.81)
RDW: 15.6 % — ABNORMAL HIGH (ref 11.5–15.5)
RDW: 15.5 % (ref 11.5–15.5)
WBC: 5.3 10*3/uL (ref 4.0–10.5)
WBC: 6.2 10*3/uL (ref 4.0–10.5)

## 2013-12-16 LAB — BASIC METABOLIC PANEL
BUN: 17 mg/dL (ref 6–23)
CO2: 26 mEq/L (ref 19–32)
Calcium: 9.3 mg/dL (ref 8.4–10.5)
Chloride: 102 mEq/L (ref 96–112)
Creatinine, Ser: 0.95 mg/dL (ref 0.50–1.35)
GFR calc Af Amer: >90 mL/min (ref >90)
GFR calc non Af Amer: >90 mL/min (ref >90)
Glucose, Bld: 90 mg/dL (ref 70–99)
Potassium: 4.2 mEq/L (ref 3.7–5.3)
Sodium: 139 mEq/L (ref 137–147)

## 2013-12-16 LAB — HIV-1 RNA QUANT-NO REFLEX-BLD
HIV 1 RNA Quant: <20 copies/mL (ref <20)
HIV-1 RNA Quant, Log: <1.3 {Log} (ref <1.30)

## 2013-12-16 LAB — T-HELPER CELL (CD4) - (RCID CLINIC ONLY)
CD4 % Helper T Cell: 37 % (ref 33–55)
CD4 T Cell Abs: 810 /uL (ref 400–2700)

## 2013-12-16 LAB — LIPID PANEL
Cholesterol: 180 mg/dL (ref 0–200)
HDL: 47 mg/dL (ref >39)
LDL Cholesterol: 116 mg/dL — ABNORMAL HIGH (ref 0–99)
Total CHOL/HDL Ratio: 3.8 Ratio
Triglycerides: 85 mg/dL (ref <150)
VLDL: 17 mg/dL (ref 0–40)

## 2013-12-16 LAB — RPR

## 2013-12-16 NOTE — Pre-Procedure Instructions (Signed)
Kenneth [name]  12/16/2013   Your procedure is scheduled on:  12/19/13  Report to [place]  2 * 3 at 8 AM.  Call this number if you have problems the morning of surgery: [phone]   Remember:   Do not eat food or drink liquids after midnight.   Take these medicines the morning of surgery with A SIP OF WATER: amlodipine,percocet,zanaflex   Do not wear jewelry, make-up or nail polish.  Do not wear lotions, powders, or perfumes. You may wear deodorant.  Do not shave 48 hours prior to surgery. Men may shave face and neck.  Do not bring valuables to the hospital.  [facility] is not responsible                  for any belongings or valuables.               Contacts, dentures or bridgework may not be worn into surgery.  Leave suitcase in the car. After surgery it may be brought to your room.  For patients admitted to the hospital, discharge time is determined by your                treatment team.               Patients discharged the day of surgery ill not be allowed to drive  home.  Name and phone number of your driver:   Special Instructions: Incentive Spirometry - Practice and bring it with you on the day of surgery.   Please read over the following fact sheets that you were given: Pain Booklet, Coughing and Deep Breathing and Surgical Site Infection Prevention

## 2013-12-18 ENCOUNTER — Encounter (HOSPITAL_COMMUNITY): Payer: Self-pay | Admitting: Anesthesiology

## 2013-12-18 ENCOUNTER — Encounter (HOSPITAL_COMMUNITY): Payer: Medicare Other | Admitting: Anesthesiology

## 2013-12-18 ENCOUNTER — Inpatient Hospital Stay (HOSPITAL_COMMUNITY): Payer: Medicare Other | Admitting: Anesthesiology

## 2013-12-19 ENCOUNTER — Encounter (HOSPITAL_COMMUNITY): Payer: Self-pay | Admitting: Anesthesiology

## 2013-12-19 ENCOUNTER — Encounter (HOSPITAL_COMMUNITY)
Admission: RE | Disposition: A | Discharge status: Home / Self Care | Payer: Self-pay | Source: Ambulatory Visit | Attending: Anesthesiology

## 2013-12-19 ENCOUNTER — Ambulatory Visit (HOSPITAL_COMMUNITY)
Admission: RE | Admit: 2013-12-19 | Discharge: 2013-12-19 | Disposition: A | Discharge status: Home / Self Care | Payer: Medicare Other | Source: Ambulatory Visit | Attending: Anesthesiology | Admitting: Anesthesiology

## 2013-12-19 DIAGNOSIS — Z5309 Procedure and treatment not carried out because of other contraindication: Secondary | ICD-10-CM | POA: Insufficient documentation

## 2013-12-19 DIAGNOSIS — F1410 Cocaine abuse, uncomplicated: Secondary | ICD-10-CM | POA: Insufficient documentation

## 2013-12-19 DIAGNOSIS — M961 Postlaminectomy syndrome, not elsewhere classified: Secondary | ICD-10-CM | POA: Insufficient documentation

## 2013-12-19 DIAGNOSIS — G8929 Other chronic pain: Secondary | ICD-10-CM | POA: Insufficient documentation

## 2013-12-19 LAB — CBC WITH DIFFERENTIAL/PLATELET
Basophils Absolute: 0 10*3/uL (ref 0.0–0.1)
Basophils Relative: 0 % (ref 0–1)
Eosinophils Absolute: 0 10*3/uL (ref 0.0–0.7)
Eosinophils Relative: 1 % (ref 0–5)
HCT: 44 % (ref 39.0–52.0)
Hemoglobin: 14.9 g/dL (ref 13.0–17.0)
Lymphocytes Relative: 33 % (ref 12–46)
Lymphs Abs: 2.4 10*3/uL (ref 0.7–4.0)
MCH: 27.9 pg (ref 26.0–34.0)
MCHC: 33.9 g/dL (ref 30.0–36.0)
MCV: 82.4 fL (ref 78.0–100.0)
Monocytes Absolute: 0.6 10*3/uL (ref 0.1–1.0)
Monocytes Relative: 8 % (ref 3–12)
Neutro Abs: 4.3 10*3/uL (ref 1.7–7.7)
Neutrophils Relative %: 58 % (ref 43–77)
Platelets: 288 10*3/uL (ref 150–400)
RBC: 5.34 MIL/uL (ref 4.22–5.81)
RDW: 15.4 % (ref 11.5–15.5)
WBC: 7.3 10*3/uL (ref 4.0–10.5)

## 2013-12-19 LAB — PROTIME-INR
INR: 0.99 (ref 0.00–1.49)
Prothrombin Time: 12.9 seconds (ref 11.6–15.2)

## 2013-12-19 LAB — APTT: aPTT: 30 seconds (ref 24–37)

## 2013-12-19 SURGERY — INSERTION, SPINAL CORD STIMULATOR, LUMBAR
Anesthesia: Monitor Anesthesia Care

## 2013-12-19 MED ORDER — CEFAZOLIN SODIUM-DEXTROSE 2-3 GM-% IV SOLR
INTRAVENOUS | Status: AC
  Filled 2013-12-19: qty 50

## 2013-12-19 MED ORDER — LACTATED RINGERS IV SOLN
INTRAVENOUS | Status: DC
  Administered 2013-12-19: 09:00:00 via INTRAVENOUS

## 2013-12-19 MED ORDER — LIDOCAINE HCL (CARDIAC) 20 MG/ML IV SOLN
INTRAVENOUS | Status: AC
Start: 2013-12-19 — End: 2013-12-19
  Filled 2013-12-19: qty 5

## 2013-12-19 MED ORDER — PROPOFOL 10 MG/ML IV BOLUS
INTRAVENOUS | Status: AC
  Filled 2013-12-19: qty 20

## 2013-12-19 MED ORDER — FENTANYL CITRATE 0.05 MG/ML IJ SOLN
INTRAMUSCULAR | Status: AC
  Filled 2013-12-19: qty 5

## 2013-12-19 MED ORDER — MIDAZOLAM HCL 2 MG/2ML IJ SOLN
INTRAMUSCULAR | Status: AC
  Filled 2013-12-19: qty 2

## 2013-12-19 MED ORDER — CEFAZOLIN SODIUM-DEXTROSE 2-3 GM-% IV SOLR: 2.0000 g | INTRAVENOUS | Status: DC

## 2013-12-19 MED ORDER — ONDANSETRON HCL 4 MG/2ML IJ SOLN
INTRAMUSCULAR | Status: AC
  Filled 2013-12-19: qty 2

## 2013-12-19 SURGICAL SUPPLY — 49 items
BAG DECANTER FOR FLEXI CONT (MISCELLANEOUS) ×2 IMPLANT
BENZOIN TINCTURE PRP APPL 2/3 (GAUZE/BANDAGES/DRESSINGS) IMPLANT
BINDER ABDOMINAL 12 ML 46-62 (SOFTGOODS) ×2 IMPLANT
BLADE SURG ROTATE 9660 (MISCELLANEOUS) IMPLANT
CHLORAPREP W/TINT 26ML (MISCELLANEOUS) ×2 IMPLANT
CONT SPEC 4OZ CLIKSEAL STRL BL (MISCELLANEOUS) ×2 IMPLANT
DERMABOND ADVANCED (GAUZE/BANDAGES/DRESSINGS) ×1
DERMABOND ADVANCED .7 DNX12 (GAUZE/BANDAGES/DRESSINGS) ×1 IMPLANT
DRAPE C-ARM 42X72 X-RAY (DRAPES) ×2 IMPLANT
DRAPE C-ARMOR (DRAPES) ×2 IMPLANT
DRAPE LAPAROTOMY 100X72X124 (DRAPES) ×2 IMPLANT
DRAPE POUCH INSTRU U-SHP 10X18 (DRAPES) ×2 IMPLANT
DRAPE SURG 17X23 STRL (DRAPES) ×2 IMPLANT
DRESSING TELFA 8X3 (GAUZE/BANDAGES/DRESSINGS) IMPLANT
DRSG OPSITE POSTOP 3X4 (GAUZE/BANDAGES/DRESSINGS) IMPLANT
DRSG OPSITE POSTOP 4X6 (GAUZE/BANDAGES/DRESSINGS) IMPLANT
ELECT REM PT RETURN 9FT ADLT (ELECTROSURGICAL) ×2
ELECTRODE REM PT RTRN 9FT ADLT (ELECTROSURGICAL) ×1 IMPLANT
GAUZE SPONGE 4X4 16PLY XRAY LF (GAUZE/BANDAGES/DRESSINGS) ×2 IMPLANT
GLOVE ECLIPSE 7.5 STRL STRAW (GLOVE) ×2 IMPLANT
GLOVE EXAM NITRILE LRG STRL (GLOVE) IMPLANT
GLOVE EXAM NITRILE MD LF STRL (GLOVE) IMPLANT
GLOVE EXAM NITRILE XL STR (GLOVE) IMPLANT
GLOVE EXAM NITRILE XS STR PU (GLOVE) IMPLANT
GOWN BRE IMP SLV AUR LG STRL (GOWN DISPOSABLE) IMPLANT
GOWN BRE IMP SLV AUR XL STRL (GOWN DISPOSABLE) IMPLANT
GOWN STRL REIN 2XL LVL4 (GOWN DISPOSABLE) IMPLANT
KIT BASIN OR (CUSTOM PROCEDURE TRAY) ×2 IMPLANT
KIT ROOM TURNOVER OR (KITS) ×2 IMPLANT
NEEDLE 18GX1X1/2 (RX/OR ONLY) (NEEDLE) IMPLANT
NEEDLE HYPO 25X1 1.5 SAFETY (NEEDLE) ×2 IMPLANT
NS IRRIG 1000ML POUR BTL (IV SOLUTION) ×2 IMPLANT
PACK LAMINECTOMY NEURO (CUSTOM PROCEDURE TRAY) ×2 IMPLANT
PAD ARMBOARD 7.5X6 YLW CONV (MISCELLANEOUS) ×2 IMPLANT
SPONGE LAP 4X18 X RAY DECT (DISPOSABLE) ×2 IMPLANT
SPONGE SURGIFOAM ABS GEL SZ50 (HEMOSTASIS) IMPLANT
STAPLER SKIN PROX WIDE 3.9 (STAPLE) ×2 IMPLANT
STRIP CLOSURE SKIN 1/2X4 (GAUZE/BANDAGES/DRESSINGS) IMPLANT
SUT MNCRL AB 4-0 PS2 18 (SUTURE) IMPLANT
SUT SILK 0 (SUTURE) ×1
SUT SILK 0 MO-6 18XCR BRD 8 (SUTURE) ×1 IMPLANT
SUT SILK 0 TIES 10X30 (SUTURE) IMPLANT
SUT SILK 2 0 TIES 10X30 (SUTURE) IMPLANT
SUT VIC AB 2-0 CP2 18 (SUTURE) ×4 IMPLANT
SYRINGE 10CC LL (SYRINGE) IMPLANT
TOWEL OR 17X24 6PK STRL BLUE (TOWEL DISPOSABLE) ×2 IMPLANT
TOWEL OR 17X26 10 PK STRL BLUE (TOWEL DISPOSABLE) ×2 IMPLANT
WATER STERILE IRR 1000ML POUR (IV SOLUTION) ×2 IMPLANT
YANKAUER SUCT BULB TIP NO VENT (SUCTIONS) ×2 IMPLANT
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
## 2013-12-19 NOTE — Progress Notes (Signed)
Patient notified to follow up with Dr. Maryjean Ka office

## 2013-12-27 NOTE — Discharge Summary (Signed)
  Patient scheduled for SCS implant.   On interview patient endorsed using cocaine 3 days prior. Not considered safe to do elective procedure and case cancelled.    Patient understands need to stop using ilicit substances. Will reschedule surgery, UDS DOS.  Patient discharged to home in stable condition, afebrile, vital signs within normal limits

## 2013-12-30 ENCOUNTER — Other Ambulatory Visit: Payer: Self-pay | Admitting: Anesthesiology

## 2014-01-01 ENCOUNTER — Encounter (HOSPITAL_COMMUNITY): Payer: Self-pay | Admitting: Emergency Medicine

## 2014-01-01 ENCOUNTER — Emergency Department (HOSPITAL_COMMUNITY)
Admission: EM | Admit: 2014-01-01 | Discharge: 2014-01-01 | Disposition: A | Discharge status: Home / Self Care | Payer: Medicare Other | Attending: Emergency Medicine | Admitting: Emergency Medicine

## 2014-01-01 DIAGNOSIS — M545 Low back pain, unspecified: Secondary | ICD-10-CM | POA: Insufficient documentation

## 2014-01-01 DIAGNOSIS — Z8661 Personal history of infections of the central nervous system: Secondary | ICD-10-CM | POA: Insufficient documentation

## 2014-01-01 DIAGNOSIS — Z8709 Personal history of other diseases of the respiratory system: Secondary | ICD-10-CM | POA: Insufficient documentation

## 2014-01-01 DIAGNOSIS — IMO0002 Reserved for concepts with insufficient information to code with codable children: Secondary | ICD-10-CM | POA: Insufficient documentation

## 2014-01-01 DIAGNOSIS — M19019 Primary osteoarthritis, unspecified shoulder: Secondary | ICD-10-CM | POA: Insufficient documentation

## 2014-01-01 DIAGNOSIS — I10 Essential (primary) hypertension: Secondary | ICD-10-CM | POA: Insufficient documentation

## 2014-01-01 DIAGNOSIS — M5137 Other intervertebral disc degeneration, lumbosacral region: Secondary | ICD-10-CM | POA: Insufficient documentation

## 2014-01-01 DIAGNOSIS — Z87891 Personal history of nicotine dependence: Secondary | ICD-10-CM | POA: Insufficient documentation

## 2014-01-01 DIAGNOSIS — G43909 Migraine, unspecified, not intractable, without status migrainosus: Secondary | ICD-10-CM | POA: Insufficient documentation

## 2014-01-01 DIAGNOSIS — Z79899 Other long term (current) drug therapy: Secondary | ICD-10-CM | POA: Insufficient documentation

## 2014-01-01 DIAGNOSIS — G8929 Other chronic pain: Secondary | ICD-10-CM | POA: Insufficient documentation

## 2014-01-01 DIAGNOSIS — M549 Dorsalgia, unspecified: Secondary | ICD-10-CM

## 2014-01-01 DIAGNOSIS — M51379 Other intervertebral disc degeneration, lumbosacral region without mention of lumbar back pain or lower extremity pain: Secondary | ICD-10-CM | POA: Insufficient documentation

## 2014-01-01 DIAGNOSIS — Z9889 Other specified postprocedural states: Secondary | ICD-10-CM | POA: Insufficient documentation

## 2014-01-01 DIAGNOSIS — I2510 Atherosclerotic heart disease of native coronary artery without angina pectoris: Secondary | ICD-10-CM | POA: Insufficient documentation

## 2014-01-01 DIAGNOSIS — Z21 Asymptomatic human immunodeficiency virus [HIV] infection status: Secondary | ICD-10-CM | POA: Insufficient documentation

## 2014-01-01 DIAGNOSIS — Z8701 Personal history of pneumonia (recurrent): Secondary | ICD-10-CM | POA: Insufficient documentation

## 2014-01-01 DIAGNOSIS — Z7982 Long term (current) use of aspirin: Secondary | ICD-10-CM | POA: Insufficient documentation

## 2014-01-01 MED ORDER — OXYCODONE-ACETAMINOPHEN 5-325 MG PO TABS
2.0000 | ORAL_TABLET | Freq: Once | ORAL | Status: AC
  Administered 2014-01-01: 2 via ORAL
  Filled 2014-01-01: qty 2
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
## 2014-01-01 NOTE — Discharge Instructions (Signed)
Read the information below.  You may return to the Emergency Department at any time for worsening condition or any new symptoms that concern you.   If you develop fevers, loss of control of bowel or bladder, weakness or numbness in your legs, or are unable to walk, return to the ER for a recheck.    Chronic Back Pain  When back pain lasts longer than 3 months, it is called chronic back pain.People with chronic back pain often go through certain periods that are more intense (flare-ups).  CAUSES Chronic back pain can be caused by wear and tear (degeneration) on different structures in your back. These structures include:  The bones of your spine (vertebrae) and the joints surrounding your spinal cord and nerve roots (facets).  The strong, fibrous tissues that connect your vertebrae (ligaments). Degeneration of these structures may result in pressure on your nerves. This can lead to constant pain. HOME CARE INSTRUCTIONS  Avoid bending, heavy lifting, prolonged sitting, and activities which make the problem worse.  Take brief periods of rest throughout the day to reduce your pain. Lying down or standing usually is better than sitting while you are resting.  Take over-the-counter or prescription medicines only as directed by your caregiver. SEEK IMMEDIATE MEDICAL CARE IF:   You have weakness or numbness in one of your legs or feet.  You have trouble controlling your bladder or bowels.  You have nausea, vomiting, abdominal pain, shortness of breath, or fainting. Document Released: 10/12/2004 Document Revised: 11/27/2011 Document Reviewed: 08/19/2011 [facility] Patient Information 2014 [place], [place].

## 2014-01-01 NOTE — ED Notes (Signed)
Pt reports that he has not taken his pain medication for three days because he is locked out of his apartment due to financial reasons, pt states he will be able to regain access to his apartment on Monday. Pt reports chronic back pain. Pt is A/O x4.

## 2014-01-02 ENCOUNTER — Encounter (HOSPITAL_COMMUNITY): Admission: RE | Payer: Self-pay | Source: Ambulatory Visit

## 2014-01-02 ENCOUNTER — Ambulatory Visit (HOSPITAL_COMMUNITY): Admission: RE | Admit: 2014-01-02 | Payer: Medicare Other | Source: Ambulatory Visit | Admitting: Anesthesiology

## 2014-01-02 SURGERY — INSERTION, SPINAL CORD STIMULATOR, LUMBAR
Anesthesia: Monitor Anesthesia Care

## 2014-01-08 NOTE — ED Provider Notes (Signed)
Medical screening examination/treatment/procedure(s) were performed by non-physician practitioner and as supervising physician I was immediately available for consultation/collaboration.    Dot Lanes, MD 01/08/14 [phone]

## 2014-01-26 ENCOUNTER — Encounter (HOSPITAL_COMMUNITY): Payer: Self-pay | Admitting: Emergency Medicine

## 2014-01-26 ENCOUNTER — Emergency Department (HOSPITAL_COMMUNITY)
Admission: EM | Admit: 2014-01-26 | Discharge: 2014-01-26 | Disposition: A | Discharge status: Home / Self Care | Payer: Medicare Other | Attending: Emergency Medicine | Admitting: Emergency Medicine

## 2014-01-26 DIAGNOSIS — Z9889 Other specified postprocedural states: Secondary | ICD-10-CM | POA: Insufficient documentation

## 2014-01-26 DIAGNOSIS — Z79899 Other long term (current) drug therapy: Secondary | ICD-10-CM | POA: Insufficient documentation

## 2014-01-26 DIAGNOSIS — Z8709 Personal history of other diseases of the respiratory system: Secondary | ICD-10-CM | POA: Insufficient documentation

## 2014-01-26 DIAGNOSIS — I2510 Atherosclerotic heart disease of native coronary artery without angina pectoris: Secondary | ICD-10-CM | POA: Insufficient documentation

## 2014-01-26 DIAGNOSIS — M545 Low back pain, unspecified: Secondary | ICD-10-CM | POA: Insufficient documentation

## 2014-01-26 DIAGNOSIS — I10 Essential (primary) hypertension: Secondary | ICD-10-CM | POA: Insufficient documentation

## 2014-01-26 DIAGNOSIS — R209 Unspecified disturbances of skin sensation: Secondary | ICD-10-CM | POA: Insufficient documentation

## 2014-01-26 DIAGNOSIS — Z87891 Personal history of nicotine dependence: Secondary | ICD-10-CM | POA: Insufficient documentation

## 2014-01-26 DIAGNOSIS — M129 Arthropathy, unspecified: Secondary | ICD-10-CM | POA: Insufficient documentation

## 2014-01-26 DIAGNOSIS — Z8701 Personal history of pneumonia (recurrent): Secondary | ICD-10-CM | POA: Insufficient documentation

## 2014-01-26 DIAGNOSIS — Z21 Asymptomatic human immunodeficiency virus [HIV] infection status: Secondary | ICD-10-CM | POA: Insufficient documentation

## 2014-01-26 DIAGNOSIS — Z981 Arthrodesis status: Secondary | ICD-10-CM | POA: Insufficient documentation

## 2014-01-26 DIAGNOSIS — Z7982 Long term (current) use of aspirin: Secondary | ICD-10-CM | POA: Insufficient documentation

## 2014-01-26 DIAGNOSIS — G8929 Other chronic pain: Secondary | ICD-10-CM | POA: Insufficient documentation

## 2014-01-26 DIAGNOSIS — M549 Dorsalgia, unspecified: Secondary | ICD-10-CM

## 2014-01-26 DIAGNOSIS — IMO0002 Reserved for concepts with insufficient information to code with codable children: Secondary | ICD-10-CM | POA: Insufficient documentation

## 2014-01-26 MED ORDER — OXYCODONE-ACETAMINOPHEN 5-325 MG PO TABS
2.0000 | ORAL_TABLET | Freq: Once | ORAL | Status: AC
  Administered 2014-01-26: 2 via ORAL
  Filled 2014-01-26: qty 2

## 2014-01-26 NOTE — ED Notes (Signed)
Pt states he has been having low back pain x 2 days. Hx of same. Ambulatory. Alert and oriented.

## 2014-01-26 NOTE — ED Provider Notes (Signed)
CSN: [id number]     Arrival date & time 01/26/14  1601 History  This chart was scribed for non-physician practitioner, Hyman Bible, PA-C working with Jasper Riling. Alvino Chapel, MD by Frederich Balding, ED scribe. This patient was seen in room WTR8/WTR8 and the patient's care was started at 4:58 PM.   Chief Complaint  Patient presents with  . Back Pain   The history is provided by the patient. No language interpreter was used.   HPI Comments: Kenneth [name] is a 58 y.o. male who presents to the Emergency Department complaining of gradual onset lower back pain that started 2 days ago. Pt has history of the same and states this is similar. He states it has worsened and started radiating into his left leg. States he also has some numbness and tingling in his left leg. Pt has been seen by pain management in the past and states he decided to take himself out. He was on percocet while in pain management and states it provided significant relief. The last time he was given oxycodone was 01/10/14. Pt was supposed to get a stimulator put in but states he was told he needed a referral from a PCP first. Denies fever, chills, bowel or bladder incontinence.  Denies history of IVDU or Cancer.  Past Medical History  Diagnosis Date  . HIV positive 2004  . Hypertension   . Meningitis   . Coronary artery disease   . Chest pain at rest 09/23/2012  . Chronic bronchitis     "q year last 5 yr or so" (09/24/2012)  . Exertional dyspnea   . Migraines   . Arthritis     "both shoulders" (09/24/2012)  . Chronic lower back pain   . Pneumonia   . Lumbar disc disease   . Iliac artery aneurysm     Right   Past Surgical History  Procedure Laterality Date  . Intussusception repair  10/2011  . Posterior lumbar fusion  1995  . Elbow surgery  ~ 1997    "removed some stones; right" (09/24/2012)  . Appendectomy  2013  . Back surgery  2006    4 BACK SURGERIES  . Abdominal surgery    . Spine surgery      Injury to back 1995    Family History  Problem Relation Age of Onset  . Heart disease Father   . Heart disease Brother   . Diabetes Brother    History  Substance Use Topics  . Smoking status: Former Smoker -- 0.10 packs/day for 45 years    Types: Cigarettes    Quit date: 09/24/2012  . Smokeless tobacco: Never Used     Comment: 09/24/2012 "been stopped now ~ 8 month".  3 per day now.  . Alcohol Use: No    Review of Systems  Constitutional: Negative for fever and chills.  Genitourinary:       Negative for bowel or bladder incontinence.   Musculoskeletal: Positive for back pain and myalgias.  Neurological: Positive for numbness.  All other systems reviewed and are negative.  Allergies  Bee venom; Flexeril; Hydrocodone; Nabumetone; [name]; Tramadol; Ace inhibitors; and Other  Home Medications   Prior to Admission medications   Medication Sig Start Date End Date Taking? Authorizing Provider  amLODipine (NORVASC) 5 MG tablet Take 1 tablet (5 mg total) by mouth daily. 04/04/13   Juluis Mire, MD  aspirin 81 MG chewable tablet Chew 81 mg by mouth daily.  01/14/12   Historical Provider, MD  elvitegravir-cobicistat-emtricitabine-tenofovir (STRIBILD) 150-150-200-300  MG TABS tablet Take 1 tablet by mouth daily with breakfast. 08/04/13   Michel Bickers, MD  EPINEPHrine (EPI-PEN) 0.3 mg/0.3 mL SOAJ Inject 0.3 mg into the muscle once as needed (Anaphylaxis).  03/09/13   Montine Circle, PA-C  Multiple Vitamin (MULTIVITAMIN WITH MINERALS) TABS Take 1 tablet by mouth daily.    Historical Provider, MD  nitroGLYCERIN (NITROSTAT) 0.4 MG SL tablet Place 0.4 mg under the tongue every 5 (five) minutes as needed for chest pain.  08/11/13   Tonia Brooms, MD  oxyCODONE-acetaminophen (PERCOCET/ROXICET) 5-325 MG per tablet Take 1-2 tablets by mouth every 8 (eight) hours as needed for moderate pain or severe pain.     Historical Provider, MD  predniSONE (DELTASONE) 20 MG tablet Take 2 tablets (40 mg total) by mouth daily.  11/27/13   Jennifer L Piepenbrink, PA-C  tiZANidine (ZANAFLEX) 4 MG tablet Take 4 mg by mouth every 6 (six) hours as needed for muscle spasms.    Historical Provider, MD   BP 136/97  Pulse 94  Temp(Src) 97.7 F (36.5 C) (Oral)  SpO2 98%  Physical Exam  Nursing note and vitals reviewed. Constitutional: He appears well-developed and well-nourished.  HENT:  Head: Normocephalic and atraumatic.  Mouth/Throat: Oropharynx is clear and moist.  Eyes: EOM are normal. Pupils are equal, round, and reactive to light.  Neck: Normal range of motion. Neck supple.  Cardiovascular: Normal rate, regular rhythm and normal heart sounds.   Pulmonary/Chest: Effort normal and breath sounds normal. He has no wheezes.  Musculoskeletal: Normal range of motion.  Mild tenderness to palpation over lumbar spine. No step offs or deformities. No other midline spine tenderness noted.   Neurological: He is alert.  Reflex Scores:      Patellar reflexes are 2+ on the right side and 2+ on the left side. Muscle strength 5/5 in bilateral lower extremities.   Skin: Skin is warm and dry.  Psychiatric: He has a normal mood and affect. His behavior is normal.    ED Course  Procedures (including critical care time)  DIAGNOSTIC STUDIES: Oxygen Saturation is 98% on RA, normal by my interpretation.    COORDINATION OF CARE: 5:02 PM-Discussed treatment plan which includes pain medication in the ED with pt at bedside and pt agreed to plan. Alerted pt that xrays are not necessary since his pain is chronic and he agrees. Advised him to try to get back into pain management since the oxycodone was helping. Will give PCP referral.  Labs Review Labs Reviewed - No data to display  Imaging Review No results found.   EKG Interpretation None      MDM   Final diagnoses:  None   Patient with back pain.  No neurological deficits and normal neuro exam.  Patient can walk but states is painful.  No loss of bowel or bladder  control.  No concern for cauda equina.  No fever, night sweats, weight loss, h/o cancer, IVDU.  Patient stable for discharge.  Patient instructed to follow up with Pain Management.  Return precautions given.   I personally performed the services described in this documentation, which was scribed in my presence. The recorded information has been reviewed and is accurate.  Hyman Bible, PA-C 01/29/14 1148

## 2014-01-26 NOTE — ED Notes (Signed)
Pt ambulatory to exam room with steady gait.  

## 2014-01-26 NOTE — Discharge Instructions (Signed)
Followup with a Primary Care Physician. Use conservative methods at home including heat therapy and cold therapy as we discussed. More information on cold therapy is listed below.  It is not recommended to use heat treatment directly after an acute injury.  SEEK IMMEDIATE MEDICAL ATTENTION IF: New numbness, tingling, weakness, or problem with the use of your arms or legs.  Severe back pain not relieved with medications.  Change in bowel or bladder control.  Increasing pain in any areas of the body (such as chest or abdominal pain).  Shortness of breath, dizziness or fainting.  Nausea (feeling sick to your stomach), vomiting, fever, or sweats.  COLD THERAPY DIRECTIONS:  Ice or gel packs can be used to reduce both pain and swelling. Ice is the most helpful within the first 24 to 48 hours after an injury or flareup from overusing a muscle or joint.  Ice is effective, has very few side effects, and is safe for most people to use.   If you expose your skin to cold temperatures for too long or without the proper protection, you can damage your skin or nerves. Watch for signs of skin damage due to cold.   HOME CARE INSTRUCTIONS  Follow these tips to use ice and cold packs safely.  Place a dry or damp towel between the ice and skin. A damp towel will cool the skin more quickly, so you may need to shorten the time that the ice is used.  For a more rapid response, add gentle compression to the ice.  Ice for no more than 10 to 20 minutes at a time. The bonier the area you are icing, the less time it will take to get the benefits of ice.  Check your skin after 5 minutes to make sure there are no signs of a poor response to cold or skin damage.  Rest 20 minutes or more in between uses.  Once your skin is numb, you can end your treatment. You can test numbness by very lightly touching your skin. The touch should be so light that you do not see the skin dimple from the pressure of your fingertip. When using  ice, most people will feel these normal sensations in this order: cold, burning, aching, and numbness.  Do not use ice on someone who cannot communicate their responses to pain, such as small children or people with dementia.   HOW TO MAKE AN ICE PACK  To make an ice pack, do one of the following:  Place crushed ice or a bag of frozen vegetables in a sealable plastic bag. Squeeze out the excess air. Place this bag inside another plastic bag. Slide the bag into a pillowcase or place a damp towel between your skin and the bag.  Mix 3 parts water with 1 part rubbing alcohol. Freeze the mixture in a sealable plastic bag. When you remove the mixture from the freezer, it will be slushy. Squeeze out the excess air. Place this bag inside another plastic bag. Slide the bag into a pillowcase or place a damp towel between your skin and the bag.   SEEK MEDICAL CARE IF:  You develop white spots on your skin. This may give the skin a blotchy (mottled) appearance.  Your skin turns blue or pale.  Your skin becomes waxy or hard.  Your swelling gets worse.  MAKE SURE YOU:  Understand these instructions.  Will watch your condition.  Will get help right away if you are not doing well or  get worse.    Chronic Pain Discharge Instructions  Emergency care providers appreciate that many patients coming to [place] are in severe pain and we wish to address their pain in the safest, most responsible manner.  It is important to recognize however, that the proper treatment of chronic pain differs from that of the pain of injuries and acute illnesses.  Our goal is to provide quality, safe, personalized care and we thank you for giving [place] the opportunity to serve you. The use of narcotics and related agents for chronic pain syndromes may lead to additional physical and psychological problems.  Nearly as many people die from prescription narcotics each year as die from car crashes.  Additionally, this risk is increased if such  prescriptions are obtained from a variety of sources.  Therefore, only your primary care physician or a pain management specialist is able to safely treat such syndromes with narcotic medications long-term.    Documentation revealing such prescriptions have been sought from multiple sources may prohibit [place] from providing a refill or different narcotic medication.  Your name may be checked first through the [facility] Controlled Substances Reporting System.  This database is a record of controlled substance medication prescriptions that the patient has received.  This has been established by [facility] in an effort to eliminate the dangerous, and often life threatening, practice of obtaining multiple prescriptions from different medical providers.   If you have a chronic pain syndrome (i.e. chronic headaches, recurrent back or neck pain, dental pain, abdominal or pelvis pain without a specific diagnosis, or neuropathic pain such as fibromyalgia) or recurrent visits for the same condition without an acute diagnosis, you may be treated with non-narcotics and other non-addictive medicines.  Allergic reactions or negative side effects that may be reported by a patient to such medications will not typically lead to the use of a narcotic analgesic or other controlled substance as an alternative.   Patients managing chronic pain with a personal physician should have provisions in place for breakthrough pain.  If you are in crisis, you should call your physician.  If your physician directs you to the emergency department, please have the doctor call and speak to our attending physician concerning your care.   When patients come to the Emergency Department (ED) with acute medical conditions in which the Emergency Department physician feels appropriate to prescribe narcotic or sedating pain medication, the physician will prescribe these in very limited quantities.  The amount of these medications will last only  until you can see your primary care physician in his/her office.  Any patient who returns to the ED seeking refills should expect only non-narcotic pain medications.   In the event of an acute medical condition exists and the emergency physician feels it is necessary that the patient be given a narcotic or sedating medication -  a responsible adult driver should be present in the room prior to the medication being given by the nurse.   Prescriptions for narcotic or sedating medications that have been lost, stolen or expired will not be refilled in the Emergency Department.    Patients who have chronic pain may receive non-narcotic prescriptions until seen by their primary care physician.  It is every patients personal responsibility to maintain active prescriptions with his or her primary care physician or specialist.   Emergency Department Resource Guide 1) Find a Doctor and Pay Out of Pocket Although you won't have to find out who is covered by your insurance plan,  it is a good idea to ask around and get recommendations. You will then need to call the office and see if the doctor you have chosen will accept you as a new patient and what types of options they offer for patients who are self-pay. Some doctors offer discounts or will set up payment plans for their patients who do not have insurance, but you will need to ask so you aren't surprised when you get to your appointment.  2) Contact Your Local Health Department Not all health departments have doctors that can see patients for sick visits, but many do, so it is worth a call to see if yours does. If you don't know where your local health department is, you can check in your phone book. The CDC also has a tool to help you locate your state's health department, and many state websites also have listings of all of their local health departments.  3) Find a [facility] If your illness is not likely to be very severe or complicated, you may  want to try a walk in clinic. These are popping up all over the country in pharmacies, drugstores, and shopping centers. They're usually staffed by nurse practitioners or physician assistants that have been trained to treat common illnesses and complaints. They're usually fairly quick and inexpensive. However, if you have serious medical issues or chronic medical problems, these are probably not your best option.  No Primary Care Doctor: - Call Health Connect at  [phone] - they can help you locate a primary care doctor that  accepts your insurance, provides certain services, etc. - Physician Referral Service- [phone]  Chronic Pain Problems: Organization         Address  Phone   Notes  [facility]  [phone] Patients need to be referred by their primary care doctor.   Medication Assistance: Organization         Address  Phone   Notes  [facility] Medication [facility] [address] [place], [place] [address] [phone] --Must be a resident of [facility] -- Must have NO insurance coverage whatsoever (no Medicaid/ Medicare, etc.) -- The pt. MUST have a primary care doctor that directs their care regularly and follows them in the community   Med[facility]ist  [phone]   [employer]  [phone]    Agencies that provide inexpensive medical care: Organization         Address  Phone   Notes  [place]  [phone]   [facility]    [phone]   [facility] [place],  [address] [phone]   [place] [address] [place] [phone]   Planned Parenthood    [phone]   [facility]    [phone]   [place] [address], [place] Phone:  [phone], Fax:  [phone] Hours of Operation:  9 am - 6 pm, M-F.  Also accepts Medicaid/Medicare and self-pay.   [facility] for [place], [address], [place] Phone: [phone], Fax: [phone]. Hours of Operation:  8:30 am - 5:30 pm, M-F.  Also accepts Medicaid and self-pay.  [facility] [place] [address] [place] Phone: [phone]   [place], [place], [place] [phone], Ext. [phone] Mondays & Thursdays: 7-9  AM.  First 15 patients are seen on a first come, first serve basis.    [place] Providers:  Organization         Address  Phone   Notes  [facility] [address], [place] [phone] Also accepts self-pay patients.  [facility] [address], [place]  [phone]   [place], [address], [place] [phone]   [facility] [address] [place] [phone]   Lucianne Lei [address], [place]   [phone] Only accepts [place] Access [place] patients after they have their name applied to their card.   Self-Pay (no insurance) in [facility]:  Organization         Address  Phone   Notes  Sickle Cell Patients, [facility] [place] [phone]   [facility] [place] [phone]   [facility] [place]  [place], [place], [place] [phone]   [facility]/Dr. Osei-Bonsu  [address] [place] or [address], [place] [phone] Phone number for both [place] and [place] locations is the same.  [facility] [address] [place] [phone]   [facility] [address], [place] or [address] [phone]   [facility] [address] [place] [phone], phone; [phone], fax Sees patients 1st and 3rd Saturday of every month.  Must not qualify for public or private insurance (i.e. Medicaid, Medicare, [place] Health Choice, Veterans' Benefits)  Household income should be no more than 200% of the poverty level The clinic cannot treat you if you are pregnant or think you are pregnant  Sexually transmitted diseases are not treated at the clinic.    Dental Care: Organization         Address  Phone  Notes  [facility] Department of [facility] [place] [phone] Accepts children up to age 55 who are enrolled in [place] or [place]; pregnant women with a Medicaid card; and children who have applied for Medicaid or [place] Health Choice, but were declined, whose parents can pay a reduced fee at time of service.  [facility] Department of [facility]  [address], [place] [phone] Accepts children up to age 42 who are enrolled in [place] or [place]; pregnant women with a Medicaid card; and children who have applied for Medicaid or [place] Health Choice, but were declined, whose parents can pay a reduced fee at time of service.  [place] Adult Dental Access PROGRAM  [place] [phone] Patients are seen by appointment only. Walk-ins are not accepted. [place] will see patients 74 years of age and older. Monday - Tuesday (8am-5pm) Most Wednesdays (8:30-5pm) $30 per visit, cash only  [facility] Adult Dental Access PROGRAM  [address], [facility] [phone] Patients are seen by appointment only. Walk-ins are not accepted. [place] will see patients 55 years of age and older. One Wednesday Evening (Monthly: Volunteer Based).  $30 per visit, cash only  [place]  [phone] for adults; Children under age 76, call  Graduate Pediatric Dentistry at [phone]. Children aged 109-14, please call [phone] to request a pediatric application.  Dental services are provided in all areas of dental care including fillings, crowns and bridges, complete and partial dentures, implants, gum treatment, root canals, and extractions. Preventive care is also provided. Treatment is provided to both adults and children. Patients are selected via a lottery and there is often a waiting list.   [facility] [address], [place]  [phone] [email or web address]   [facility] [address] [place], [place] [phone], Ext. [phone] Second and Fourth Thursday of each month, opens at 6:30 AM; Clinic ends at 9 AM.  Patients are seen on a first-come first-served basis, and a limited number are seen during each clinic.   [facility]  [address] Hillard Danker [place], [place] [phone]   Eligibility Requirements You must have lived in [place], [place], or [employer] [place] counties for at least the last three months.   You cannot be eligible for state or federal sponsored [employer], including [employer], [place], or [employer].   You generally cannot be eligible for healthcare insurance through your employer.    How to apply: Eligibility screenings are held every Tuesday and Wednesday afternoon from 1:00 pm until 4:00 pm. You do not need an appointment for the interview!  [facility] [address] [place], [place]   [place]  [place] Department  [place]  [phone]    Behavioral Health Resources in the Community: Intensive Outpatient Programs Organization         Address  Phone  Notes  [place]. [address] [place], [place] [phone]   [facility] Outpatient [address] [place], [place]   ADS: Alcohol & Drug S[facility]cs [address] [place], [place]    [place] [address]  [place], [place] or [phone]   Substance Abuse Resources Organization         Address  Phone  Notes  Alcohol and Drug Services  [phone]   [place]  [phone]   [facility] [place]   Chinita Pester  [phone]   Residential & Outpatient Substance Abuse Program  [phone]   Psychological Services Organization         Address  Phone  Notes  [facility] [place]  [place]  [phone]   [place] [address] [place] or [phone]    Mobile Crisis Teams Organization         Address  Phone  Notes  [facility], Mobile Crisis Care Unit  [phone]   [facility]  [address] [place], [place]   [place] [address], [place] [phone]    Self-Help/Support Groups Organization         Address  Phone             Notes  [place]. of [place] - variety of support groups  [place] Call for more information  Narcotics Anonymous (NA), [facility] [address], [employer] [place]  2 meetings at this location   Special educational needs teacher         Address  Phone  Notes  [facility] [place],  [place]  [place]  [address], [place] [phone], [place], [place]   [place], [place] [phone] Admissions: 8am-3pm M-F  Incentives Substance [place] [address],    [place], [place] [phone]   [facility] [address] [place], [place], [place]   [facility] [address],  [place], [place]   [facility] Programs - Intensive Outpatient [address], [place], [place]   [facility] ([place] [place].,  [place], [place] [phone] or [phone]   [facility]idential Treatment Services (RTS) [address], [place], [place] Accepts Medicaid  [facility] [place] [address]  [place] [phone] Substance Abuse/Addiction Treatment   [facility] Organization         Address  Phone  Notes  [facility]  [phone]   Domenic Schwab, PhD [address] Arlis [place], [place]   [phone] or [phone]   [place], [place] [phone]   [facility] [address] [place], [place] [phone] Insurance/Medicaid/sponsorship through [facility] and Families [address], [address] [place]                                    [place], [place] [phone] [place] [address], [place] [phone]    Dr. Adele Schilder  [phone]   [facility] Dept. 1) [address] [place] 2) [place] 3)  [place] [address], [place] [phone]  [phone]   [place] [phone] or [phone] (After Hours)

## 2014-01-26 NOTE — ED Notes (Signed)
Pt states he has a ride home

## 2014-01-27 ENCOUNTER — Telehealth: Payer: Self-pay | Admitting: *Deleted

## 2014-01-27 ENCOUNTER — Encounter: Payer: Self-pay | Admitting: Internal Medicine

## 2014-01-27 ENCOUNTER — Ambulatory Visit (INDEPENDENT_AMBULATORY_CARE_PROVIDER_SITE_OTHER): Payer: Medicare Other | Admitting: Internal Medicine

## 2014-01-27 VITALS — BP 153/102 | HR 83 | Temp 97.3°F | Ht 67.0 in | Wt 156.3 lb

## 2014-01-27 DIAGNOSIS — M545 Low back pain, unspecified: Secondary | ICD-10-CM

## 2014-01-27 DIAGNOSIS — G8929 Other chronic pain: Secondary | ICD-10-CM

## 2014-01-27 DIAGNOSIS — I10 Essential (primary) hypertension: Secondary | ICD-10-CM

## 2014-01-27 DIAGNOSIS — I509 Heart failure, unspecified: Secondary | ICD-10-CM

## 2014-01-27 MED ORDER — AMLODIPINE BESYLATE 10 MG PO TABS: 5.0000 mg | ORAL_TABLET | Freq: Every day | ORAL | Status: DC

## 2014-01-27 NOTE — Telephone Encounter (Signed)
Call to [facility] to request office notes from patient visits.  Message was left for Medical Records to call the Clinics.  Sander Nephew, RN 01/27/2014 12:05 PM.

## 2014-01-27 NOTE — Progress Notes (Signed)
Subjective:   Patient ID: Kenneth [name] male   DOB: 1956-05-24 58 y.o.   MRN: [id number]  HPI: Kenneth [name] is a 58 y.o. man with PMH significant for HIV with CD count 810 (March 2015), HTN, CHF, Chronic lower back pain with lumbar radiculopathy comes to the office requesting a refill of Percocet.  Patient has chronic lower back pain and is followed by Dr. Clydell Hakim (Neurosurgery). Patient underwent a trial of temporary spinal cord stimulator for 5 days (? in feb/march 2015) and apparently his symptoms were improved and was planned to get spinal cord stimulator(SCS) implant surgery done by Dr. Maryjean Ka on 12/19/13. Apparently patient stated that he used cocaine couple of days prior to the surgery and hence the surgery was cancelled. Patient states that the secretary for the neurosurgeon will call him on 01/29/14 and will let him know when they will do the surgery.   Patient is been followed by [facility] in [place] by Dr. Manfred Shirts and he was tested positive for cocaine last month. Patient states that he was expelled from the pain clinic.Patient was seen in the ED for chronic lower back pain on 01/26/14 and was given 2 tablets of percocet and was asked to follow up with the PCP.   Patient reports that he has pain in the mid to lower back, radiating down his left leg. He also complains of occasional associated tingling and numbness. He denies any bowel or bladder incontinence or saddle anesthesia. He denies any fever, chills, body pains. He states the pain worsened by walking, bending and relieved by percocet.   He denies any other complaints.  Past Medical History  Diagnosis Date  . HIV positive 2004  . Hypertension   . Meningitis   . Coronary artery disease   . Chest pain at rest 09/23/2012  . Chronic bronchitis     "q year last 5 yr or so" (09/24/2012)  . Exertional dyspnea   . Migraines   . Arthritis     "both shoulders" (09/24/2012)  . Chronic lower back pain   .  Pneumonia   . Lumbar disc disease   . Iliac artery aneurysm     Right   Current Outpatient Prescriptions  Medication Sig Dispense Refill  . amLODipine (NORVASC) 10 MG tablet Take 0.5 tablets (5 mg total) by mouth daily.  30 tablet  10  . aspirin 81 MG chewable tablet Chew 81 mg by mouth daily.       [place] Kitchen elvitegravir-cobicistat-emtricitabine-tenofovir (STRIBILD) 150-150-200-300 MG TABS tablet Take 1 tablet by mouth daily with breakfast.  30 tablet  11  . EPINEPHrine (EPI-PEN) 0.3 mg/0.3 mL SOAJ Inject 0.3 mg into the muscle once as needed (Anaphylaxis).       . Multiple Vitamin (MULTIVITAMIN WITH MINERALS) TABS Take 1 tablet by mouth daily.      . nitroGLYCERIN (NITROSTAT) 0.4 MG SL tablet Place 0.4 mg under the tongue every 5 (five) minutes as needed for chest pain.       Mar[place] Kitchen oxyCODONE-acetaminophen (PERCOCET/ROXICET) 5-325 MG per tablet Take 1-2 tablets by mouth every 8 (eight) hours as needed for moderate pain or severe pain.       Mar[place] Kitchen tiZANidine (ZANAFLEX) 4 MG tablet Take 4 mg by mouth every 6 (six) hours as needed for muscle spasms.       No current facility-administered medications for this visit.   Family History  Problem Relation Age of Onset  . Heart disease Father   . Heart  disease Brother   . Diabetes Brother    History   Social History  . Marital Status: Married    Spouse Name: N/A    Number of Children: N/A  . Years of Education: N/A   Social History Main Topics  . Smoking status: Current Every Day Smoker -- 0.50 packs/day for 45 years    Types: Cigarettes    Last Attempt to Quit: 09/24/2012  . Smokeless tobacco: Never Used     Comment: 09/24/2012 "been stopped now ~ 8 month".  3 per day now.  . Alcohol Use: No  . Drug Use: Yes    Special: Cocaine     Comment: last use 09/24/2012  . Sexual Activity: None     Comment: declined condoms   Other Topics Concern  . None   Social History Narrative  . None   Review of Systems: Pertinent items are noted in  HPI. Objective:  Physical Exam: Filed Vitals:   01/27/14 1001  BP: 153/102  Pulse: 83  Temp: 97.3 F (36.3 C)  TempSrc: Oral  Height: 5\' 7"  (1.702 m)  Weight: 156 lb 4.8 oz (70.897 kg)  SpO2: 98%   Constitutional: Vital signs reviewed.   Patient is a well-developed and well-nourished and is in no acute distress and cooperative with exam. Alert and oriented x3.  Cardiovascular: RRR, S1 normal, S2 normal. Pulmonary/Chest: normal respiratory effort, CTAB. Musculoskeletal: Mild tenderness to palpation over the midline at the entire lumbar and upper sacral vertebrae. SLR test was limited and demonstrated pain, worse on the left compared to the right.  Neurological: A&O x3, Strength is normal in upper extremities. Strength appears to be 4-5/5 in both extremities limited because of the pain. Skin: Warm, dry and intact.  Psychiatric: Normal mood and affect.   Assessment & Plan:

## 2014-01-27 NOTE — Telephone Encounter (Signed)
Call to [facility] to obtain office visit notes.  Message was left on answering machine for Medical Records to call the Clinics.  Sander Nephew, RN 01/27/2014 12:02 PM

## 2014-01-27 NOTE — Patient Instructions (Signed)
Take Over the counter Tylenol 1-2 tablets every 6 hours as needed for pain. Take all the medications as advised below. If your pain gets worse, seek medical help or go to emergency.

## 2014-01-27 NOTE — Assessment & Plan Note (Signed)
In the setting of chronic cocaine abuse.  No symptoms or signs of fluid overload.  Plans: Increase Amlodipine to 10 mg qd. But given his low LVEF, will need to consider starting him on ACEI, BB therapy instead of amlodipine therapy. Will defer this decision to the PCP.

## 2014-01-27 NOTE — Assessment & Plan Note (Signed)
Not sure the exact pathology of his chronic lower back pain. Xray of lumbar spine from march 2015 reveal mild disc degeneration and spurring at L3/4,L4/5,L5/S1. Patient is followed by Neurosurgery (Dr. Clydell Hakim) and was supposed to get the SCS Implant surgery (Spinal cord stimulator) on 12/19/13. Surgery was cancelled as he used cocaine few days prior to the surgery. Per patient, Dr. Maryjean Ka office will call him on 01/29/14 regarding the date of surgery. Patient was followed by [facility] (Dr. Manfred Shirts) in [place] and was expelled from the clinic as he was tested positive for cocaine in UDS.  Discussed with Dr. Marinda Elk regarding further management and plan.  Plans: Called neurosurgery office and [facility] pain [facility] clinic for all the records. Patient was declined by [facility] in the past and from [facility]. (As per reviewing records in [facility]). We will make further decisions regarding his pain management after reviewing the records from the neurosurgery and pain clinic. Patient has a history of chronic cocaine abuse, cocaine induced RCA infarct. Patient states that he allergic to hydrocodone, naproxen, tramadol, flexeril which leaves [place] only with Tylenol as the only option. Recommended to take Tylenol 1-2 tablets as needed for pain, in the meantime. Will follow up in a week.

## 2014-01-27 NOTE — Assessment & Plan Note (Signed)
BP elevated.  Plans: Increase Amlodipine to 10 mg qd. Defer further management to PCP. Given low LV EF, patient might benefit from ACE-I, BB therapy.

## 2014-01-28 ENCOUNTER — Encounter (HOSPITAL_COMMUNITY): Payer: Self-pay | Admitting: Emergency Medicine

## 2014-01-28 ENCOUNTER — Emergency Department (HOSPITAL_COMMUNITY)
Admission: EM | Admit: 2014-01-28 | Discharge: 2014-01-29 | Disposition: A | Discharge status: Home / Self Care | Payer: Medicare Other | Attending: Emergency Medicine | Admitting: Emergency Medicine

## 2014-01-28 DIAGNOSIS — G8929 Other chronic pain: Secondary | ICD-10-CM | POA: Insufficient documentation

## 2014-01-28 DIAGNOSIS — I10 Essential (primary) hypertension: Secondary | ICD-10-CM | POA: Insufficient documentation

## 2014-01-28 DIAGNOSIS — Z8661 Personal history of infections of the central nervous system: Secondary | ICD-10-CM | POA: Insufficient documentation

## 2014-01-28 DIAGNOSIS — Z79899 Other long term (current) drug therapy: Secondary | ICD-10-CM | POA: Insufficient documentation

## 2014-01-28 DIAGNOSIS — M19019 Primary osteoarthritis, unspecified shoulder: Secondary | ICD-10-CM | POA: Insufficient documentation

## 2014-01-28 DIAGNOSIS — G43909 Migraine, unspecified, not intractable, without status migrainosus: Secondary | ICD-10-CM | POA: Insufficient documentation

## 2014-01-28 DIAGNOSIS — Z8709 Personal history of other diseases of the respiratory system: Secondary | ICD-10-CM | POA: Insufficient documentation

## 2014-01-28 DIAGNOSIS — F17200 Nicotine dependence, unspecified, uncomplicated: Secondary | ICD-10-CM | POA: Insufficient documentation

## 2014-01-28 DIAGNOSIS — I2510 Atherosclerotic heart disease of native coronary artery without angina pectoris: Secondary | ICD-10-CM | POA: Insufficient documentation

## 2014-01-28 DIAGNOSIS — Z8701 Personal history of pneumonia (recurrent): Secondary | ICD-10-CM | POA: Insufficient documentation

## 2014-01-28 DIAGNOSIS — IMO0002 Reserved for concepts with insufficient information to code with codable children: Secondary | ICD-10-CM | POA: Insufficient documentation

## 2014-01-28 DIAGNOSIS — M549 Dorsalgia, unspecified: Secondary | ICD-10-CM | POA: Insufficient documentation

## 2014-01-28 DIAGNOSIS — Z21 Asymptomatic human immunodeficiency virus [HIV] infection status: Secondary | ICD-10-CM | POA: Insufficient documentation

## 2014-01-28 NOTE — ED Notes (Signed)
Pt states has been moving into a new house, states the past 4 days has been having severe back pain, shoulder pain and leg pain.

## 2014-01-28 NOTE — Progress Notes (Signed)
Case discussed with Dr. Boggala at the time of the visit.  We reviewed the resident's history and exam and pertinent patient test results.  I agree with the assessment, diagnosis, and plan of care documented in the resident's note. 

## 2014-01-29 ENCOUNTER — Emergency Department (HOSPITAL_COMMUNITY): Payer: Medicare Other

## 2014-01-29 LAB — URINALYSIS, ROUTINE W REFLEX MICROSCOPIC
Bilirubin Urine: NEGATIVE
Glucose, UA: NEGATIVE mg/dL
Hgb urine dipstick: NEGATIVE
Ketones, ur: NEGATIVE mg/dL
Leukocytes, UA: NEGATIVE
Nitrite: NEGATIVE
Protein, ur: NEGATIVE mg/dL
Specific Gravity, Urine: 1.003 — ABNORMAL LOW (ref 1.005–1.030)
Urobilinogen, UA: 0.2 mg/dL (ref 0.0–1.0)
pH: 5.5 (ref 5.0–8.0)

## 2014-01-29 MED ORDER — HYDROMORPHONE HCL PF 1 MG/ML IJ SOLN
1.0000 mg | Freq: Once | INTRAMUSCULAR | Status: AC
  Administered 2014-01-29: 1 mg via INTRAMUSCULAR
  Filled 2014-01-29: qty 1

## 2014-01-29 MED ORDER — OXYCODONE-ACETAMINOPHEN 5-325 MG PO TABS: 1.0000 | ORAL_TABLET | Freq: Once | ORAL | Status: DC

## 2014-01-29 MED ORDER — PREDNISONE 20 MG PO TABS: ORAL_TABLET | ORAL | Status: DC

## 2014-01-29 NOTE — ED Notes (Signed)
Pt states he normally has low back pain but it is getting worse

## 2014-01-29 NOTE — ED Provider Notes (Signed)
CSN: [id number]     Arrival date & time 01/28/14  2234 History   First MD Initiated Contact with Patient 01/29/14 0031     Chief Complaint  Patient presents with  . Back Pain     (Consider location/radiation/quality/duration/timing/severity/associated sxs/prior Treatment) The history is provided by the patient. No language interpreter was used.  Massey Ruhland is a 58 year old male with past medical history of HIV, hypertension, meningitis, coronary artery disease, chronic back pain presenting to the ED with lumbar discomfort starting today. Stated that he's been lifting heavy furniture secondary to moving into a new apartment. Stated he's been using a back brace and lifting from his knees-reported that when he was moving a piece of furniture he twisted his back in all part manner the lead to instantaneous pain. Reported the pain is localized to his lumbar spine describes a sharp shooting pain with radiation down bilateral legs. Stated that he has a tingling sensation bilaterally. Reported weakness in his left leg more so when compared to his right leg. Patient reported that he is to be part of a pain clinic-reported that he is no longer there, reports he has an appointment with the ground [facility] pain clinic in July 2015. Denied fever, chills, chest pain, shortness of breath, difficulty breathing, urinary bowel continence, numbness, loss of sensation. PCP Dr. Naaman Plummer  Past Medical History  Diagnosis Date  . HIV positive 2004  . Hypertension   . Meningitis   . Coronary artery disease   . Chest pain at rest 09/23/2012  . Chronic bronchitis     "q year last 5 yr or so" (09/24/2012)  . Exertional dyspnea   . Migraines   . Arthritis     "both shoulders" (09/24/2012)  . Chronic lower back pain   . Pneumonia   . Lumbar disc disease   . Iliac artery aneurysm     Right   Past Surgical History  Procedure Laterality Date  . Intussusception repair  10/2011  . Posterior lumbar fusion  1995  . Elbow  surgery  ~ 1997    "removed some stones; right" (09/24/2012)  . Appendectomy  2013  . Back surgery  2006    4 BACK SURGERIES  . Abdominal surgery    . Spine surgery      Injury to back 1995   Family History  Problem Relation Age of Onset  . Heart disease Father   . Heart disease Brother   . Diabetes Brother    History  Substance Use Topics  . Smoking status: Current Every Day Smoker -- 0.50 packs/day for 45 years    Types: Cigarettes    Last Attempt to Quit: 09/24/2012  . Smokeless tobacco: Never Used     Comment: 09/24/2012 "been stopped now ~ 8 month".  3 per day now.  . Alcohol Use: No    Review of Systems  Constitutional: Negative for fever and chills.  Respiratory: Negative for chest tightness and shortness of breath.   Gastrointestinal: Negative for nausea, vomiting, abdominal pain and diarrhea.  Musculoskeletal: Positive for back pain. Negative for neck pain and neck stiffness.  Neurological: Negative for dizziness, weakness and numbness.  All other systems reviewed and are negative.     Allergies  Bee venom; Flexeril; Hydrocodone; Nabumetone; [name]; Tramadol; Ace inhibitors; and Other  Home Medications   Prior to Admission medications   Medication Sig Start Date End Date Taking? Authorizing Provider  amLODipine (NORVASC) 10 MG tablet Take 0.5 tablets (5 mg total) by  mouth daily. 01/27/14  Yes Carter Kitten, MD  elvitegravir-cobicistat-emtricitabine-tenofovir (STRIBILD) 150-150-200-300 MG TABS tablet Take 1 tablet by mouth daily with breakfast. 08/04/13  Yes Michel Bickers, MD  Multiple Vitamin (MULTIVITAMIN WITH MINERALS) TABS Take 1 tablet by mouth daily.   Yes Historical Provider, MD  oxyCODONE-acetaminophen (PERCOCET/ROXICET) 5-325 MG per tablet Take 1-2 tablets by mouth every 8 (eight) hours as needed for moderate pain or severe pain.    Yes Historical Provider, MD  tiZANidine (ZANAFLEX) 4 MG tablet Take 4 mg by mouth every 6 (six) hours as needed for muscle  spasms.   Yes Historical Provider, MD  EPINEPHrine (EPI-PEN) 0.3 mg/0.3 mL SOAJ Inject 0.3 mg into the muscle once as needed (Anaphylaxis).  03/09/13   Montine Circle, PA-C  nitroGLYCERIN (NITROSTAT) 0.4 MG SL tablet Place 0.4 mg under the tongue every 5 (five) minutes as needed for chest pain.  08/11/13   Tonia Brooms, MD   BP 136/90  Pulse 71  Temp(Src) 98 F (36.7 C) (Oral)  Resp 20  SpO2 95% Physical Exam  Nursing note and vitals reviewed. Constitutional: He is oriented to person, place, and time. He appears well-developed and well-nourished. No distress.  HENT:  Head: Normocephalic and atraumatic.  Mouth/Throat: Oropharynx is clear and moist. No oropharyngeal exudate.  Eyes: Conjunctivae and EOM are normal. Pupils are equal, round, and reactive to light. Right eye exhibits no discharge. Left eye exhibits no discharge.  Neck: Normal range of motion. Neck supple. No tracheal deviation present.  Cardiovascular: Normal rate, regular rhythm and normal heart sounds.  Exam reveals no friction rub.   No murmur heard. Pulmonary/Chest: Effort normal and breath sounds normal. No respiratory distress. He has no wheezes. He has no rales.  Genitourinary:  Rectal Exam: Good sphincter tone - strong.  Exam chaperoned with nurse.  Musculoskeletal: Normal range of motion. He exhibits tenderness.  Linear scar noted to the lumbosacral spine - healed well. Negative swelling, erythema, inflammation, lesions, sores, deformities noted to the lumbosacral spine. Discomfort upon palpation to the lumbosacral spine midpsinal and bilateral paravertebral.  Full ROM to upper and lower extremities without difficulty noted, negative ataxia noted.  Lymphadenopathy:    He has no cervical adenopathy.  Neurological: He is alert and oriented to person, place, and time. No cranial nerve deficit. He exhibits normal muscle tone. Coordination normal.  Cranial nerves III-XII grossly intact Strength 5+/5+ to upper and lower  extremities bilaterally with resistance applied, equal distribution noted Strength intact to digits of the feet bilaterally  Sensation intact with differentiation to sharp and dull touch   Skin: Skin is warm and dry. No rash noted. He is not diaphoretic. No erythema.  Psychiatric: He has a normal mood and affect. His behavior is normal. Thought content normal.    ED Course  Procedures (including critical care time)  Results for orders placed during the hospital encounter of 01/28/14  URINALYSIS, ROUTINE W REFLEX MICROSCOPIC      Result Value Ref Range   Color, Urine YELLOW  YELLOW   APPearance CLEAR  CLEAR   Specific Gravity, Urine 1.003 (*) 1.005 - 1.030   pH 5.5  5.0 - 8.0   Glucose, UA NEGATIVE  NEGATIVE mg/dL   Hgb urine dipstick NEGATIVE  NEGATIVE   Bilirubin Urine NEGATIVE  NEGATIVE   Ketones, ur NEGATIVE  NEGATIVE mg/dL   Protein, ur NEGATIVE  NEGATIVE mg/dL   Urobilinogen, UA 0.2  0.0 - 1.0 mg/dL   Nitrite NEGATIVE  NEGATIVE   Leukocytes, UA  NEGATIVE  NEGATIVE    Labs Review Labs Reviewed  URINALYSIS, ROUTINE W REFLEX MICROSCOPIC - Abnormal; Notable for the following:    Specific Gravity, Urine 1.003 (*)    All other components within normal limits    Imaging Review Dg Lumbar Spine Complete  01/29/2014   CLINICAL DATA:  Low back pain  EXAM: LUMBAR SPINE - COMPLETE 4+ VIEW  COMPARISON:  11/20/2013  FINDINGS: Vertebral body height is well maintained. Disc space narrowing is noted at L3-4, L4-5 and L5-S1. Facet hypertrophic changes are noted at these levels as well. No acute fracture is noted. No gross soft tissue abnormality is seen.  IMPRESSION: Degenerative change without acute abnormality.   Electronically Signed   By: Inez Catalina M.D.   On: 01/29/2014 02:29     EKG Interpretation None      MDM   Final diagnoses:  Chronic back pain   Medications  HYDROmorphone (DILAUDID) injection 1 mg (1 mg Intramuscular Given 01/29/14 0213)   Filed Vitals:   01/28/14  2306 01/29/14 0042 01/29/14 0450  BP: 137/109 142/96 136/90  Pulse: 88 77 71  Temp: 98.5 F (36.9 C)  98 F (36.7 C)  TempSrc: Oral  Oral  Resp: 20 16 20   SpO2: 99% 96% 95%    This provider reviewed patient's chart. Patient is been seen and assessed numerous times in ED setting regarding her chronic back pain. Patient used to be followed by pain management and had a lumbar spine stimulator placed-was removed. Patient was to to get the stimulator inserted on 01/02/2014, but procedure was canceled secondary to loss of family member as per patient's report. Plain film of lumbar spine noted degenerative change without acute abnormalities. Urinalysis negative for infection-negative nitrites and leukocytes identified. Doubt cauda equina. Doubt epidural abscess. This appears to be a chronic issue. Patient is a primary care provider as well as pain management set up. Nonemergent MRI needed at this time. Pain controlled while in ED setting. Patient related well on ED setting. Patient stable, afebrile. Discharged patient. Referred patient to neurosurgery, primary care provider in pain clinic. Discussed with patient to avoid any physical or shortness activity. Discussed with patient to closely monitor symptoms and if symptoms are to worsen or change to report back to the ED - strict return instructions given.  Patient agreed to plan of care, understood, all questions answered.    Jamse Mead, PA-C 01/29/14 [phone]

## 2014-01-29 NOTE — Discharge Instructions (Signed)
Please call your doctor for a followup appointment within 24-48 hours. When you talk to your doctor please let them know that you were seen in the emergency department and have them acquire all of your records so that they can discuss the findings with you and formulate a treatment plan to fully care for your new and ongoing problems. Please call and set-up an appointment with your primary care provider to be re-assessed within 24 hours Please call and set-up an appointment with Neurosurgery regarding ongoing back pain Please rest and stay hydrated Please avoid any physical or strenuous activity Please continue to monitor symptoms closely and if symptoms are to worsen or change (fever greater than 101, chills, sweating, fall, injury, numbness, tingling, inability to control urine or bowel movements, legs give out, weakness, loss of sensation) please report back to the ED immediately    Back Pain, Adult Low back pain is very common. About 1 in 5 people have back pain.The cause of low back pain is rarely dangerous. The pain often gets better over time.About half of people with a sudden onset of back pain feel better in just 2 weeks. About 8 in 10 people feel better by 6 weeks.  CAUSES Some common causes of back pain include:  Strain of the muscles or ligaments supporting the spine.  Wear and tear (degeneration) of the spinal discs.  Arthritis.  Direct injury to the back. DIAGNOSIS Most of the time, the direct cause of low back pain is not known.However, back pain can be treated effectively even when the exact cause of the pain is unknown.Answering your caregiver's questions about your overall health and symptoms is one of the most accurate ways to make sure the cause of your pain is not dangerous. If your caregiver needs more information, he or she may order lab work or imaging tests (X-rays or MRIs).However, even if imaging tests show changes in your back, this usually does not require  surgery. HOME CARE INSTRUCTIONS For many people, back pain returns.Since low back pain is rarely dangerous, it is often a condition that people can learn to [facility] their own.   Remain active. It is stressful on the back to sit or stand in one place. Do not sit, drive, or stand in one place for more than 30 minutes at a time. Take short walks on level surfaces as soon as pain allows.Try to increase the length of time you walk each day.  Do not stay in bed.Resting more than 1 or 2 days can delay your recovery.  Do not avoid exercise or work.Your body is made to move.It is not dangerous to be active, even though your back may hurt.Your back will likely heal faster if you return to being active before your pain is gone.  Pay attention to your body when you bend and lift. Many people have less discomfortwhen lifting if they bend their knees, keep the load close to their bodies,and avoid twisting. Often, the most comfortable positions are those that put less stress on your recovering back.  Find a comfortable position to sleep. Use a firm mattress and lie on your side with your knees slightly bent. If you lie on your back, put a pillow under your knees.  Only take over-the-counter or prescription medicines as directed by your caregiver. Over-the-counter medicines to reduce pain and inflammation are often the most helpful.Your caregiver may prescribe muscle relaxant drugs.These medicines help dull your pain so you can more quickly return to your normal activities and  healthy exercise.  Put ice on the injured area.  Put ice in a plastic bag.  Place a towel between your skin and the bag.  Leave the ice on for 15-20 minutes, 03-04 times a day for the first 2 to 3 days. After that, ice and heat may be alternated to reduce pain and spasms.  Ask your caregiver about trying back exercises and gentle massage. This may be of some benefit.  Avoid feeling anxious or stressed.Stress increases  muscle tension and can worsen back pain.It is important to recognize when you are anxious or stressed and learn ways to manage it.Exercise is a great option. SEEK MEDICAL CARE IF:  You have pain that is not relieved with rest or medicine.  You have pain that does not improve in 1 week.  You have new symptoms.  You are generally not feeling well. SEEK IMMEDIATE MEDICAL CARE IF:   You have pain that radiates from your back into your legs.  You develop new bowel or bladder control problems.  You have unusual weakness or numbness in your arms or legs.  You develop nausea or vomiting.  You develop abdominal pain.  You feel faint. Document Released: 09/04/2005 Document Revised: 03/05/2012 Document Reviewed: 01/23/2011 [facility] Patient Information 2014 [place], [place].

## 2014-01-29 NOTE — ED Notes (Signed)
Pt returned from XRAY 

## 2014-01-29 NOTE — ED Notes (Signed)
Pt did well with ambulation but states left knee was aching and he felt dizzy when standing.

## 2014-01-29 NOTE — ED Provider Notes (Signed)
Medical screening examination/treatment/procedure(s) were performed by non-physician practitioner and as supervising physician I was immediately available for consultation/collaboration.   EKG Interpretation None        Hoy Morn, MD 01/29/14 [phone]

## 2014-01-29 NOTE — ED Notes (Signed)
Pt aware of the need for a urine sample, urinal at bedside. 

## 2014-01-30 NOTE — ED Provider Notes (Signed)
Medical screening examination/treatment/procedure(s) were performed by non-physician practitioner and as supervising physician I was immediately available for consultation/collaboration.   EKG Interpretation None       Han Lysne R. Devyn Sheerin, MD 01/30/14 2325 

## 2014-02-01 ENCOUNTER — Encounter (HOSPITAL_COMMUNITY): Payer: Self-pay | Admitting: Emergency Medicine

## 2014-02-01 ENCOUNTER — Emergency Department (HOSPITAL_COMMUNITY): Payer: Medicare Other

## 2014-02-01 ENCOUNTER — Observation Stay (HOSPITAL_COMMUNITY)
Admission: EM | Admit: 2014-02-01 | Discharge: 2014-02-02 | Disposition: A | Discharge status: Home / Self Care | Payer: Medicare Other | Attending: Oncology | Admitting: Oncology

## 2014-02-01 DIAGNOSIS — Z8709 Personal history of other diseases of the respiratory system: Secondary | ICD-10-CM | POA: Insufficient documentation

## 2014-02-01 DIAGNOSIS — R072 Precordial pain: Principal | ICD-10-CM | POA: Insufficient documentation

## 2014-02-01 DIAGNOSIS — M19019 Primary osteoarthritis, unspecified shoulder: Secondary | ICD-10-CM | POA: Diagnosis not present

## 2014-02-01 DIAGNOSIS — B20 Human immunodeficiency virus [HIV] disease: Secondary | ICD-10-CM | POA: Diagnosis present

## 2014-02-01 DIAGNOSIS — I2510 Atherosclerotic heart disease of native coronary artery without angina pectoris: Secondary | ICD-10-CM | POA: Insufficient documentation

## 2014-02-01 DIAGNOSIS — Z79899 Other long term (current) drug therapy: Secondary | ICD-10-CM | POA: Insufficient documentation

## 2014-02-01 DIAGNOSIS — G8929 Other chronic pain: Secondary | ICD-10-CM | POA: Diagnosis not present

## 2014-02-01 DIAGNOSIS — Z8701 Personal history of pneumonia (recurrent): Secondary | ICD-10-CM | POA: Insufficient documentation

## 2014-02-01 DIAGNOSIS — I10 Essential (primary) hypertension: Secondary | ICD-10-CM | POA: Diagnosis not present

## 2014-02-01 DIAGNOSIS — Z21 Asymptomatic human immunodeficiency virus [HIV] infection status: Secondary | ICD-10-CM | POA: Insufficient documentation

## 2014-02-01 DIAGNOSIS — Z8669 Personal history of other diseases of the nervous system and sense organs: Secondary | ICD-10-CM | POA: Insufficient documentation

## 2014-02-01 DIAGNOSIS — M940 Chondrocostal junction syndrome [Tietze]: Secondary | ICD-10-CM | POA: Diagnosis present

## 2014-02-01 DIAGNOSIS — R079 Chest pain, unspecified: Secondary | ICD-10-CM | POA: Diagnosis present

## 2014-02-01 DIAGNOSIS — J439 Emphysema, unspecified: Secondary | ICD-10-CM | POA: Diagnosis present

## 2014-02-01 DIAGNOSIS — F17200 Nicotine dependence, unspecified, uncomplicated: Secondary | ICD-10-CM | POA: Insufficient documentation

## 2014-02-01 DIAGNOSIS — IMO0002 Reserved for concepts with insufficient information to code with codable children: Secondary | ICD-10-CM | POA: Insufficient documentation

## 2014-02-01 DIAGNOSIS — D1803 Hemangioma of intra-abdominal structures: Secondary | ICD-10-CM | POA: Insufficient documentation

## 2014-02-01 LAB — BASIC METABOLIC PANEL
BUN: 16 mg/dL (ref 6–23)
CO2: 27 mEq/L (ref 19–32)
Calcium: 9.5 mg/dL (ref 8.4–10.5)
Chloride: 105 mEq/L (ref 96–112)
Creatinine, Ser: 0.97 mg/dL (ref 0.50–1.35)
GFR calc Af Amer: >90 mL/min (ref >90)
GFR calc non Af Amer: 89 mL/min — ABNORMAL LOW (ref >90)
Glucose, Bld: 75 mg/dL (ref 70–99)
Potassium: 4.1 mEq/L (ref 3.7–5.3)
Sodium: 143 mEq/L (ref 137–147)

## 2014-02-01 LAB — CBC
HCT: 43.8 % (ref 39.0–52.0)
Hemoglobin: 14.5 g/dL (ref 13.0–17.0)
MCH: 27.4 pg (ref 26.0–34.0)
MCHC: 33.1 g/dL (ref 30.0–36.0)
MCV: 82.6 fL (ref 78.0–100.0)
Platelets: 282 10*3/uL (ref 150–400)
RBC: 5.3 MIL/uL (ref 4.22–5.81)
RDW: 15.2 % (ref 11.5–15.5)
WBC: 5.3 10*3/uL (ref 4.0–10.5)

## 2014-02-01 LAB — I-STAT TROPONIN, ED
Troponin i, poc: 0.01 ng/mL (ref 0.00–0.08)
Troponin i, poc: 0 ng/mL (ref 0.00–0.08)

## 2014-02-01 LAB — LIPASE, BLOOD: Lipase: 23 U/L (ref 11–59)

## 2014-02-01 MED ORDER — ELVITEG-COBIC-EMTRICIT-TENOFDF 150-150-200-300 MG PO TABS
1.0000 | ORAL_TABLET | Freq: Every day | ORAL | Status: DC
  Administered 2014-02-01 – 2014-02-02 (×2): 1 via ORAL
  Filled 2014-02-01 (×3): qty 1

## 2014-02-01 MED ORDER — NITROGLYCERIN 2 % TD OINT
1.0000 [in_us] | TOPICAL_OINTMENT | Freq: Once | TRANSDERMAL | Status: AC
  Administered 2014-02-01: 1 [in_us] via TOPICAL
  Filled 2014-02-01: qty 1

## 2014-02-01 MED ORDER — ACETAMINOPHEN 325 MG PO TABS: 650.0000 mg | ORAL_TABLET | Freq: Four times a day (QID) | ORAL | Status: DC | PRN

## 2014-02-01 MED ORDER — HYDROMORPHONE HCL PF 1 MG/ML IJ SOLN
1.0000 mg | Freq: Once | INTRAMUSCULAR | Status: AC
  Administered 2014-02-01: 1 mg via INTRAVENOUS
  Filled 2014-02-01: qty 1

## 2014-02-01 MED ORDER — AMLODIPINE BESYLATE 10 MG PO TABS
10.0000 mg | ORAL_TABLET | Freq: Once | ORAL | Status: AC
  Administered 2014-02-01: 10 mg via ORAL
  Filled 2014-02-01: qty 1

## 2014-02-01 MED ORDER — LIDOCAINE 5 % EX PTCH
1.0000 | MEDICATED_PATCH | CUTANEOUS | Status: DC
  Administered 2014-02-01: 1 via TRANSDERMAL
  Filled 2014-02-01 (×2): qty 1

## 2014-02-01 MED ORDER — ONDANSETRON HCL 4 MG/2ML IJ SOLN: 4.0000 mg | Freq: Four times a day (QID) | INTRAMUSCULAR | Status: DC | PRN

## 2014-02-01 MED ORDER — IOHEXOL 350 MG/ML SOLN
100.0000 mL | Freq: Once | INTRAVENOUS | Status: AC | PRN
  Administered 2014-02-01: 100 mL via INTRAVENOUS

## 2014-02-01 MED ORDER — DICLOFENAC SODIUM 1 % TD GEL
2.0000 g | Freq: Four times a day (QID) | TRANSDERMAL | Status: DC
  Administered 2014-02-01 – 2014-02-02 (×3): 2 g via TOPICAL
  Filled 2014-02-01: qty 100

## 2014-02-01 MED ORDER — MORPHINE SULFATE 2 MG/ML IJ SOLN: 2.0000 mg | INTRAMUSCULAR | Status: DC | PRN

## 2014-02-01 MED ORDER — ONDANSETRON HCL 4 MG PO TABS: 4.0000 mg | ORAL_TABLET | Freq: Four times a day (QID) | ORAL | Status: DC | PRN

## 2014-02-01 MED ORDER — ASPIRIN 81 MG PO CHEW
324.0000 mg | CHEWABLE_TABLET | Freq: Once | ORAL | Status: AC
  Administered 2014-02-01: 324 mg via ORAL
  Filled 2014-02-01: qty 4

## 2014-02-01 MED ORDER — ACETAMINOPHEN 650 MG RE SUPP: 650.0000 mg | Freq: Four times a day (QID) | RECTAL | Status: DC | PRN

## 2014-02-01 MED ORDER — SODIUM CHLORIDE 0.9 % IJ SOLN
3.0000 mL | Freq: Two times a day (BID) | INTRAMUSCULAR | Status: DC
  Administered 2014-02-01 – 2014-02-02 (×2): 3 mL via INTRAVENOUS

## 2014-02-01 MED ORDER — GI COCKTAIL ~~LOC~~: 30.0000 mL | Freq: Two times a day (BID) | ORAL | Status: DC | PRN

## 2014-02-01 MED ORDER — MORPHINE SULFATE 4 MG/ML IJ SOLN
4.0000 mg | Freq: Once | INTRAMUSCULAR | Status: AC
  Administered 2014-02-01: 4 mg via INTRAVENOUS
  Filled 2014-02-01: qty 1

## 2014-02-01 MED ORDER — HEPARIN SODIUM (PORCINE) 5000 UNIT/ML IJ SOLN
5000.0000 [IU] | Freq: Three times a day (TID) | INTRAMUSCULAR | Status: DC
  Administered 2014-02-01 – 2014-02-02 (×2): 5000 [IU] via SUBCUTANEOUS
  Filled 2014-02-01 (×5): qty 1

## 2014-02-01 MED ORDER — OXYCODONE-ACETAMINOPHEN 5-325 MG PO TABS
1.0000 | ORAL_TABLET | Freq: Three times a day (TID) | ORAL | Status: DC | PRN
  Administered 2014-02-01 – 2014-02-02 (×3): 2 via ORAL
  Filled 2014-02-01 (×3): qty 2

## 2014-02-01 NOTE — ED Notes (Signed)
Pt reports initially having back pain, now also having sharp pains to mid chest, increases with movement and breathing. Denies cough. Airway intact, ekg done at triage.

## 2014-02-01 NOTE — ED Provider Notes (Signed)
CSN: [id number]     Arrival date & time 02/01/14  5465 History   First MD Initiated Contact with Patient 02/01/14 1010     Chief Complaint  Patient presents with  . Chest Pain     (Consider location/radiation/quality/duration/timing/severity/associated sxs/prior Treatment) HPI  Kenneth [name] is a 58 y.o. 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 10 out of 10 at onset, 10 out of 10 right now, no pain medication prior to arrival, no aspirin today. Pain is worsened by palpation and in certain positions.  Pain has been constant, non-exertional, non-pleuritic. Does not feel like prior heart attack. Last cocaine use 5 days ago. Denies SOB, N/V, diaphoresis, cough, fever, back pain, syncope, prior episodes. Denies h/o DVT, PE,  recent travel, leg swelling, hemoptysis.  Pt has not received any ASA or NTG in the last 24 hours.  Cath: ? Stents Cardiologost: None PCP: Rabini    Past Medical History  Diagnosis Date  . HIV positive 2004  . Hypertension   . Meningitis   . Coronary artery disease   . Chest pain at rest 09/23/2012  . Chronic bronchitis     "q year last 5 yr or so" (09/24/2012)  . Exertional dyspnea   . Migraines   . Arthritis     "both shoulders" (09/24/2012)  . Chronic lower back pain   . Pneumonia   . Lumbar disc disease   . Iliac artery aneurysm     Right   Past Surgical History  Procedure Laterality Date  . Intussusception repair  10/2011  . Posterior lumbar fusion  1995  . Elbow surgery  ~ 1997    "removed some stones; right" (09/24/2012)  . Appendectomy  2013  . Back surgery  2006    4 BACK SURGERIES  . Abdominal surgery    . Spine surgery     Injury to back 1995   Family History  Problem Relation Age of Onset  . Heart disease Father   . Heart disease Brother   . Diabetes Brother    History  Substance Use Topics  . Smoking status: Current Every Day Smoker -- 0.50 packs/day for 45 years    Types: Cigarettes    Last Attempt to Quit: 09/24/2012  . Smokeless tobacco: Never Used     Comment: 09/24/2012 "been stopped now ~ 8 month".  3 per day now.  . Alcohol Use: No    Review of Systems  10 systems reviewed and found to be negative, except as noted in the HPI.   Allergies  Bee venom; Flexeril; Hydrocodone; Nabumetone; [name]; Tramadol; Ace inhibitors; and Other  Home Medications   Prior to Admission medications   Medication Sig Start Date End Date Taking? Authorizing Provider  amLODipine (NORVASC) 10 MG tablet Take 0.5 tablets (5 mg total) by mouth daily. 01/27/14   Carter Kitten, MD  elvitegravir-cobicistat-emtricitabine-tenofovir (STRIBILD) 150-150-200-300 MG TABS tablet Take 1 tablet by mouth daily with breakfast. 08/04/13   Michel Bickers, MD  EPINEPHrine (EPI-PEN) 0.3 mg/0.3 mL SOAJ Inject 0.3 mg into the muscle once as needed (Anaphylaxis).  03/09/13   Montine Circle, PA-C  Multiple Vitamin (MULTIVITAMIN WITH MINERALS) TABS Take 1 tablet by mouth daily.  Historical Provider, MD  nitroGLYCERIN (NITROSTAT) 0.4 MG SL tablet Place 0.4 mg under the tongue every 5 (five) minutes as needed for chest pain.  08/11/13   Tonia Brooms, MD  oxyCODONE-acetaminophen (PERCOCET/ROXICET) 5-325 MG per tablet Take 1-2 tablets by mouth every 8 (eight) hours as needed for moderate pain or severe pain.     Historical Provider, MD  predniSONE (DELTASONE) 20 MG tablet 3 tabs po day one, then 2 tabs daily x 4 days 01/29/14   Marissa Sciacca, PA-C  tiZANidine (ZANAFLEX) 4 MG tablet Take 4 mg by mouth every 6 (six) hours as needed for muscle spasms.    Historical Provider, MD   BP 153/110  Pulse 89  Temp(Src) 97.8 F (36.6 C) (Oral)  Resp  22  SpO2 98% Physical Exam  Nursing note and vitals reviewed. Constitutional: He is oriented to person, place, and time. He appears well-developed and well-nourished. No distress.  HENT:  Head: Normocephalic and atraumatic.  Mouth/Throat: Oropharynx is clear and moist.  Eyes: Conjunctivae and EOM are normal. Pupils are equal, round, and reactive to light.  Neck: Normal range of motion.  Cardiovascular: Normal rate, regular rhythm and intact distal pulses.   Pulmonary/Chest: Effort normal and breath sounds normal. No stridor. No respiratory distress. He has no wheezes. He has no rales. He exhibits no tenderness.  Abdominal: Soft. Bowel sounds are normal. He exhibits no distension and no mass. There is no tenderness. There is no rebound and no guarding.  Musculoskeletal: Normal range of motion.  Neurological: He is alert and oriented to person, place, and time.  Psychiatric: He has a normal mood and affect.    ED Course  Procedures (including critical care time) Labs Review Labs Reviewed  BASIC METABOLIC PANEL - Abnormal; Notable for the following:    GFR calc non Af Amer 89 (*)    All other components within normal limits  CBC  I-STAT TROPOININ, ED    Imaging Review Dg Chest 2 View  02/01/2014   CLINICAL DATA:  Shortness of breath, chest pressure.  EXAM: CHEST  2 VIEW  COMPARISON:  DG CHEST 2 VIEW dated 09/21/2013; DG CHEST 2 VIEW dated 07/30/2013  FINDINGS: Stable cardiac and mediastinal contours. Unchanged coarse interstitial opacities right lung base. No large consolidative pulmonary opacities. Biapical emphysematous change. No pleural effusion or pneumothorax Regional skeleton is unremarkable.  IMPRESSION: Grossly similar coarse interstitial opacities right lung base may represent scarring and or atelectasis.   Electronically Signed   By: Lovey Newcomer M.D.   On: 02/01/2014 11:36     EKG Interpretation   Date/Time:  Sunday Feb 01 2014 09:59:15 EDT Ventricular Rate:  87 PR  Interval:  192 QRS Duration: 92 QT Interval:  388 QTC Calculation: 466 R Axis:   -59 Text Interpretation:  Normal sinus rhythm Left anterior fascicular block  Possible Lateral infarct , age undetermined Abnormal ECG Confirmed by  ZACKOWSKI  MD, SCOTT [phone]) on 02/01/2014 10:13:07 AM      MDM   Final diagnoses:  Chest pain  HIV (human immunodeficiency virus infection)    Filed Vitals:   02/01/14 1003 02/01/14 1022 02/01/14 1022  BP: 152/107 156/105 153/110  Pulse: 89    Temp: 97.8 F (36.6 C)    TempSrc: Oral    Resp: 22    SpO2: 98%      Medications  aspirin chewable tablet 324 mg (324 mg Oral Given 02/01/14 1139)    Kenneth [name] is a 58 y.o. male presenting with  sharp substernal chest pain, pain is pleuritic and positional. Pain is severe, 10 out of 10, maximal at onset. Patient was significantly elevated blood pressure, has not taken his blood pressure meds today. Patient has history of cocaine induced induced ACS RCA infarct. No stents. Last cocaine use was 5 days ago. EKG is nonischemic and troponin is negative. Concern for aortic dissection. CT chest and abdomen is negative. Patient continues to have severe pain. I wonder if this is his chronic pain or cardiac in etiology. Patient will be admitted for a cardiac rule out to internal medicine teaching service.  Note: Portions of this report may have been transcribed using voice recognition software. Every effort was made to ensure accuracy; however, inadvertent computerized transcription errors may be present     Monico Blitz, PA-C 02/01/14 1514

## 2014-02-01 NOTE — H&P (Signed)
Date: 02/01/2014               Patient Name:  Kenneth [name] MRN: [id number]  DOB: 01-Oct-1955 Age / Sex: 58 y.o., male   PCP: Juluis Mire, MD         Medical Service: Internal Medicine Teaching Service         Attending Physician: Dr. Annia Belt, MD    First Contact: Dr. Lum Babe, MD Pager: [phone]  Second Contact: Dr. Clinton Gallant, MD Pager: [phone]       After Hours (After 5p/  First Contact Pager: [phone]  weekends / holidays): Second Contact Pager: [phone]   Chief Complaint: Chest Pain  History of Present Illness: Kenneth [name] is a 58 y.o. man with a pmhx detailed below who presents with a cc of chest pain. He was in his normal state of health until this am when he stood up and fest sharp anterior chest pain. He chronically has back pain. He denies SOB, but admits to recent coughing. He admits to recent use of cocaine 3 days ago. He denies new LE swelling. He denies fevers or chills.   Meds: Current Facility-Administered Medications  Medication Dose Route Frequency Provider Last Rate Last Dose  . acetaminophen (TYLENOL) tablet 650 mg  650 mg Oral Q6H PRN Clinton Gallant, MD       Or  . acetaminophen (TYLENOL) suppository 650 mg  650 mg Rectal Q6H PRN Clinton Gallant, MD      . diclofenac sodium (VOLTAREN) 1 % transdermal gel 2 g  2 g Topical QID Clinton Gallant, MD      . elvitegravir-cobicistat-emtricitabine-tenofovir (STRIBILD) 150-150-200-300 MG tablet 1 tablet  1 tablet Oral Q breakfast Clinton Gallant, MD      . gi cocktail (Maalox,Lidocaine,Donnatal)  30 mL Oral BID PRN Clinton Gallant, MD      . heparin injection 5,000 Units  5,000 Units Subcutaneous 3 times per day Clinton Gallant, MD      . lidocaine (LIDODERM) 5 % 1 patch  1 patch Transdermal Q24H Clinton Gallant, MD      . morphine 2 MG/ML injection 2 mg  2 mg Intravenous Q3H PRN Clinton Gallant, MD      . ondansetron (ZOFRAN) tablet 4 mg  4 mg Oral Q6H PRN Clinton Gallant, MD       Or  . ondansetron (ZOFRAN) injection 4 mg  4 mg Intravenous  Q6H PRN Clinton Gallant, MD      . oxyCODONE-acetaminophen (PERCOCET/ROXICET) 5-325 MG per tablet 1-2 tablet  1-2 tablet Oral Q8H PRN Clinton Gallant, MD      . sodium chloride 0.9 % injection 3 mL  3 mL Intravenous Q12H Clinton Gallant, MD        Allergies: Allergies as of 02/01/2014 - Review Complete 02/01/2014  Allergen Reaction Noted  . Bee venom Anaphylaxis 09/18/2012  . Flexeril [cyclobenzaprine hcl] Anaphylaxis, Shortness Of Breath, and Rash 10/28/2011  . Hydrocodone Itching 09/13/2012  . Nabumetone Shortness Of Breath, Itching, and Other (See Comments) 09/18/2012  . [name] [naproxen] Shortness Of Breath, Nausea And Vomiting, and Rash 10/28/2011  . Tramadol Shortness Of Breath and Rash 10/28/2011  . Ace inhibitors Itching 01/31/2013  . Other Itching 09/18/2012   Past Medical History  Diagnosis Date  . HIV positive 2004  . Hypertension   . Meningitis   . Coronary artery disease   . Chest pain at rest 09/23/2012  . Chronic bronchitis     "q year last 5 yr or  so" (09/24/2012)  . Exertional dyspnea   . Migraines   . Arthritis     "both shoulders" (09/24/2012)  . Chronic lower back pain   . Pneumonia   . Lumbar disc disease   . Iliac artery aneurysm     Right   Past Surgical History  Procedure Laterality Date  . Intussusception repair  10/2011  . Posterior lumbar fusion  1995  . Elbow surgery  ~ 1997    "removed some stones; right" (09/24/2012)  . Appendectomy  2013  . Back surgery  2006    4 BACK SURGERIES  . Abdominal surgery    . Spine surgery      Injury to back 1995   Family History  Problem Relation Age of Onset  . Heart disease Father   . Heart disease Brother   . Diabetes Brother    History   Social History  . Marital Status: Married    Spouse Name: N/A    Number of Children: N/A  . Years of Education: N/A   Occupational History  . Not on file.   Social History Main Topics  . Smoking status: Current Every Day Smoker -- 0.50 packs/day for 45 years    Types:  Cigarettes    Last Attempt to Quit: 09/24/2012  . Smokeless tobacco: Never Used     Comment: 09/24/2012 "been stopped now ~ 8 month".  3 per day now.  . Alcohol Use: No  . Drug Use: Yes    Special: Cocaine     Comment: last use 09/24/2012  . Sexual Activity: Not on file     Comment: declined condoms   Other Topics Concern  . Not on file   Social History Narrative  . No narrative on file    Review of Systems: Pertinent items are noted in HPI.  Physical Exam: Blood pressure 136/96, pulse 65, temperature 97.7 F (36.5 C), temperature source Oral, resp. rate 18, SpO2 95.00%. Physical Exam  Constitutional: He is oriented to person, place, and time. He appears well-developed and well-nourished. No distress.  Cardiovascular: Normal rate, regular rhythm, normal heart sounds and intact distal pulses.  Exam reveals no friction rub.   No murmur heard. Pulmonary/Chest: Effort normal and breath sounds normal. No respiratory distress. He has no wheezes. He has no rales. He exhibits tenderness.  Abdominal: Soft. Bowel sounds are normal. He exhibits no distension. There is no tenderness. There is no rebound and no guarding.  Musculoskeletal: He exhibits tenderness. He exhibits no edema.  Neurological: He is alert and oriented to person, place, and time.  Skin: He is not diaphoretic.  Psychiatric:  Tearful. States he is living out of his truck. Life has been hard and his family is fighting a lot.  Depressed mood and affect.     Lab results: Basic Metabolic Panel:  Recent Labs  02/01/14 1033  NA 143  K 4.1  CL 105  CO2 27  GLUCOSE 75  BUN 16  CREATININE 0.97  CALCIUM 9.5   Recent Labs  02/01/14 1530  LIPASE 23   CBC:  Recent Labs  02/01/14 1033  WBC 5.3  HGB 14.5  HCT 43.8  MCV 82.6  PLT 282   Urine Drug Screen: Drugs of Abuse     Component Value Date/Time   LABOPIA NONE DETECTED 09/21/2013 0932   LABOPIA NEG 05/02/2013 1602   COCAINSCRNUR NONE DETECTED 09/21/2013  0932   COCAINSCRNUR PPS 05/02/2013 1602   LABBENZ NONE DETECTED 09/21/2013 0932  LABBENZ NEG 05/02/2013 1602   AMPHETMU NONE DETECTED 09/21/2013 0932   THCU NONE DETECTED 09/21/2013 0932   LABBARB NONE DETECTED 09/21/2013 0932   LABBARB NEG 05/02/2013 1602     Imaging results:  Dg Chest 2 View  02/01/2014   CLINICAL DATA:  Shortness of breath, chest pressure.  EXAM: CHEST  2 VIEW  COMPARISON:  DG CHEST 2 VIEW dated 09/21/2013; DG CHEST 2 VIEW dated 07/30/2013  FINDINGS: Stable cardiac and mediastinal contours. Unchanged coarse interstitial opacities right lung base. No large consolidative pulmonary opacities. Biapical emphysematous change. No pleural effusion or pneumothorax Regional skeleton is unremarkable.  IMPRESSION: Grossly similar coarse interstitial opacities right lung base may represent scarring and or atelectasis.   Electronically Signed   By: Lovey Newcomer M.D.   On: 02/01/2014 11:36   Ct Angio Chest Aortic Dissect W &/or W/o  02/01/2014   CLINICAL DATA:  Chest pain.  Back pain.  EXAM: CT ANGIOGRAPHY CHEST, ABDOMEN AND PELVIS  TECHNIQUE: Multidetector CT imaging through the chest, abdomen and pelvis was performed using the standard protocol during bolus administration of intravenous contrast. Multiplanar reconstructed images and MIPs were obtained and reviewed to evaluate the vascular anatomy.  CONTRAST:  153mL OMNIPAQUE IOHEXOL 350 MG/ML SOLN  COMPARISON:  CT ANGIO CHEST W/CM &/OR WO/CM dated 09/27/2012; CT ABD/PELVIS W CM dated 06/04/2011  FINDINGS: CTA CHEST FINDINGS  No evidence of intramural hematoma involving the ascending or descending aorta. Mild atherosclerotic calcifications of the large.  No evidence of aortic dissection or aneurysm.  Innominate artery, right subclavian artery, right common carotid artery, right vertebral artery, left common carotid artery, left subclavian artery, left vertebral artery are patent within the confines of the exam.  No obvious filling defect in the pulmonary  arterial tree to suggest acute pulmonary thromboembolism.  No abnormal mediastinal adenopathy. Small mediastinal nodes are noted. Mild peribronchovascular soft tissue thickening about the hilar regions.  Severe emphysema. Stable left upper lobe 6 mm sub solid nodule on image 56. This supports benign etiology.  No acute bony deformity.  Review of the MIP images confirms the above findings.  CTA ABDOMEN AND PELVIS FINDINGS  Aorta is non aneurysmal and patent. No evidence of aortic dissection.  Celiac is patent.  Branch vessels are patent.  SMA is patent. Branch vessels are patent. Replaced right hepatic artery.  Single renal arteries are patent.  Atherosclerotic changes of the right common iliac artery are present including a small medial penetrating ulcer. Mild ectasia at the ulcer with a caliber of 18 mm. Right internal and external iliac arteries are patent.  Left common and external iliac arteries are patent. Moderate narrowing at the origin of the left internal iliac artery with some post stenotic dilatation. Left internal iliac artery is ectatic with a maximal caliber of 11 mm.  Proximal femoral arteries are patent  Stable right lobe hepatic hemangioma.  Gallbladder, spleen, pancreas, adrenal glands are within normal limits. Kidneys are unremarkable.  Bladder and prostate are unremarkable.  No free-fluid. No obvious retroperitoneal adenopathy. Small and multiple para-aortic nodes are present.  There is no vertebral compression deformity. Advanced degenerative changes in the lower lumbar spine are present characterize by degenerative disc disease and facet arthropathy. This could certainly be the cause of the patient's back pain.  Review of the MIP images confirms the above findings.  IMPRESSION: No evidence of thoracic or abdominal aortic dissection.  Severe emphysema.  Peribronchovascular soft tissues in the hilar regions likely represent chronic inflammation of the central  airways.  Atherosclerotic changes of  the iliac arteries as described. Moderate narrowing at the origin of the left internal iliac artery. 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  Degenerative changes in the lower lumbar spine which could be the cause of the patient's back pain.   Electronically Signed   By: Maryclare Bean M.D.   On: 02/01/2014 13:32   Ct Angio Abd/pel W/ And/or W/o  02/01/2014   CLINICAL DATA:  Chest pain.  Back pain.  EXAM: CT ANGIOGRAPHY CHEST, ABDOMEN AND PELVIS  TECHNIQUE: Multidetector CT imaging through the chest, abdomen and pelvis was performed using the standard protocol during bolus administration of intravenous contrast. Multiplanar reconstructed images and MIPs were obtained and reviewed to evaluate the vascular anatomy.  CONTRAST:  110mL OMNIPAQUE IOHEXOL 350 MG/ML SOLN  COMPARISON:  CT ANGIO CHEST W/CM &/OR WO/CM dated 09/27/2012; CT ABD/PELVIS W CM dated 06/04/2011  FINDINGS: CTA CHEST FINDINGS  No evidence of intramural hematoma involving the ascending or descending aorta. Mild atherosclerotic calcifications of the large.  No evidence of aortic dissection or aneurysm.  Innominate artery, right subclavian artery, right common carotid artery, right vertebral artery, left common carotid artery, left subclavian artery, left vertebral artery are patent within the confines of the exam.  No obvious filling defect in the pulmonary arterial tree to suggest acute pulmonary thromboembolism.  No abnormal mediastinal adenopathy. Small mediastinal nodes are noted. Mild peribronchovascular soft tissue thickening about the hilar regions.  Severe emphysema. Stable left upper lobe 6 mm sub solid nodule on image 56. This supports benign etiology.  No acute bony deformity.  Review of the MIP images confirms the above findings.  CTA ABDOMEN AND PELVIS FINDINGS  Aorta is non aneurysmal and patent. No evidence of aortic dissection.  Celiac is patent.  Branch vessels are patent.  SMA is patent.  Branch vessels are patent. Replaced right hepatic artery.  Single renal arteries are patent.  Atherosclerotic changes of the right common iliac artery are present including a small medial penetrating ulcer. Mild ectasia at the ulcer with a caliber of 18 mm. Right internal and external iliac arteries are patent.  Left common and external iliac arteries are patent. Moderate narrowing at the origin of the left internal iliac artery with some post stenotic dilatation. Left internal iliac artery is ectatic with a maximal caliber of 11 mm.  Proximal femoral arteries are patent  Stable right lobe hepatic hemangioma.  Gallbladder, spleen, pancreas, adrenal glands are within normal limits. Kidneys are unremarkable.  Bladder and prostate are unremarkable.  No free-fluid. No obvious retroperitoneal adenopathy. Small and multiple para-aortic nodes are present.  There is no vertebral compression deformity. Advanced degenerative changes in the lower lumbar spine are present characterize by degenerative disc disease and facet arthropathy. This could certainly be the cause of the patient's back pain.  Review of the MIP images confirms the above findings.  IMPRESSION: No evidence of thoracic or abdominal aortic dissection.  Severe emphysema.  Peribronchovascular soft tissues in the hilar regions likely represent chronic inflammation of the central airways.  Atherosclerotic changes of the iliac arteries as described. Moderate narrowing at the origin of the left internal iliac artery. 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  Degenerative changes in the lower lumbar spine which could be the cause of the patient's back pain.   Electronically Signed   By: Maryclare Bean M.D.   On: 02/01/2014 13:32  Other results: EKG: nonspecific ST and T waves changes. Borderline q waves laterally.  Assessment & Plan by Problem: Principal Problem:   Chest pain Active Problems:   HTN  (hypertension)   COPD (chronic obstructive pulmonary disease) with emphysema   Chronic pain   Costochondritis   HIV (human immunodeficiency virus infection)  Chest Pain likely due to costochondritis The patient has anterior chest pain the is tender to palpation. It is made worse with deep inspiration and made better with narcotic pain medications.  Patient reports recent use of cocaine (4 days ago). Of note the patient has been in the ED 13 times in the last 6 month. He is often in the ED for pain complaints. Initial troponin negative. EKG demonstrated borderline q waves laterally. CTA negative for dissection. - ASA statin, Hold BB given cocaine use - repeat EKG in am. - UDS - IV Morphine prn  Chronic pain Patient has failed to establish care with multiple pain clinics. He is repeatedly positive for cocaine. I explained that we will be unable to provide him with narcotic pain medications as an outpatient.  - IV morphine prn  COPD No wheezing on PE. No home meds.  HIV Suppressed with CD4 of 810. -continue home meds  Dispo: Disposition is deferred at this time, awaiting improvement of current medical problems. Anticipated discharge in approximately 1 day(s).   The patient does have a current PCP (Juluis Mire, MD) and does need an [facility] hospital follow-up appointment after discharge.  The patient does not have transportation limitations that hinder transportation to clinic appointments.  Signed: Marrion Coy, MD 02/01/2014, 4:55 PM  Internal Medicine Attending Admission Note Date: 02/02/2014  Patient name: Kenneth [name] Medical record number: [id number] Date of birth: [birth year] Age: 58 y.o. Gender: male  I interviewed and examined the patient. I reviewed the resident's note along with pertinent lab and Xray studies and I agree with the resident's findings and plan as documented in the resident's note.  Chief Complaint(s): Acute onset stabbing sternal chest  pain   History - key components related to admission: 58 year old man with chronic back pain 15 years status post a crush injury. He had an exacerbation of the back pain over the last few days. He had a new pain which started in the left lateral rib area which radiated to the mid chest. This was a stabbing pain associated with dyspnea and he admits to a pleuritic component. He has no prior cardiac history. No signs or symptoms of acute infection. No recent trauma. CT angiogram done through the emergency department to rule out a dissecting aneurysm not felt to show any evidence for this or for any acute pulmonary emboli. He has advanced, bullous, lung disease. Atherosclerotic disease seen in the iliac vessels.    Physical Exam - key components related to admission: Lungs are clear and resonant to percussion throughout. Regular cardiac rhythm no murmur gallop or rub, no peripheral edema, no calf tenderness. Carotids 2+ no bruits There is chest wall tenderness over the costochondral junctions midsternum.   Filed Vitals:   02/01/14 1621 02/01/14 1622 02/01/14 2050 02/02/14 0513  BP: 136/96  121/83 126/83  Pulse: 65  69 62  Temp: 97.7 F (36.5 C)  97.5 F (36.4 C) 97.7 F (36.5 C)  TempSrc: Oral  Oral Oral  Resp: 18  16 16   Height:  5\' 8"  (1.727 m)    Weight:  160 lb (72.576 kg)    SpO2: 95%  94% 94%  Lab results:   Basic Metabolic Panel:  Recent Labs  02/01/14 1033 02/02/14 0110  NA 143 138  K 4.1 3.8  CL 105 102  CO2 27 27  GLUCOSE 75 88  BUN 16 16  CREATININE 0.97 1.01  CALCIUM 9.5 8.8    Liver Function Tests: No results found for this basename: AST, ALT, ALKPHOS, BILITOT, PROT, ALBUMIN,  in the last 72 hours  Recent Labs  02/01/14 1530  LIPASE 23   No results found for this basename: AMMONIA,  in the last 72 hours  CBC:  Recent Labs  02/01/14 1033 02/02/14 0110  WBC 5.3 6.7  HGB 14.5 13.7  HCT 43.8 41.8  MCV 82.6 82.8  PLT 282 263   No results  found for this basename: NEUTROABS, LYMPHSABS, MONOABS, EOSABS, BASOSABS,  in the last 72 hours  Cardiac Enzymes:  Recent Labs  02/02/14 0110  TROPONINI <0.30    BNP: No results found for this basename: PROBNP,  in the last 72 hours  D-Dimer: No results found for this basename: DDIMER,  in the last 72 hours  CBG: No results found for this basename: GLUCAP,  in the last 72 hours  Hemoglobin A1C: No results found for this basename: HGBA1C,  in the last 72 hours Fasting Lipid Panel: No results found for this basename: CHOL, HDL, LDLCALC, TRIG, CHOLHDL, LDLDIRECT,  in the last 72 hours  Thyroid Function Tests: No results found for this basename: TSH, T4TOTAL, FREET4, T3FREE, THYROIDAB,  in the last 72 hours  Anemia Panel: No results found for this basename: VITAMINB12, FOLATE, FERRITIN, TIBC, IRON, RETICCTPCT,  in the last 72 hours  Coagulation: No results found for this basename: INR,  in the last 72 hours  Urine Drug Screen: Drugs of Abuse     Component Value Date/Time   LABOPIA NONE DETECTED 09/21/2013 0932   LABOPIA NEG 05/02/2013 1602   COCAINSCRNUR NONE DETECTED 09/21/2013 0932   COCAINSCRNUR PPS 05/02/2013 1602   LABBENZ NONE DETECTED 09/21/2013 0932   LABBENZ NEG 05/02/2013 1602   AMPHETMU NONE DETECTED 09/21/2013 0932   THCU NONE DETECTED 09/21/2013 0932   LABBARB NONE DETECTED 09/21/2013 0932   LABBARB NEG 05/02/2013 1602     Alcohol Level: No results found for this basename: ETH,  in the last 72 hours  Urinalysis    Component Value Date/Time   COLORURINE YELLOW 01/29/2014 0426   APPEARANCEUR CLEAR 01/29/2014 0426   LABSPEC 1.003* 01/29/2014 0426   PHURINE 5.5 01/29/2014 0426   GLUCOSEU NEGATIVE 01/29/2014 0426   HGBUR NEGATIVE 01/29/2014 0426   BILIRUBINUR NEGATIVE 01/29/2014 0426   KETONESUR NEGATIVE 01/29/2014 0426   PROTEINUR NEGATIVE 01/29/2014 0426   UROBILINOGEN 0.2 01/29/2014 0426   NITRITE NEGATIVE 01/29/2014 0426   LEUKOCYTESUR NEGATIVE 01/29/2014 0426     Urine microscopic: No results found for this basename: EPIU, WBCU, RBCU, BACTERIA, LABCAST, OTHERU,  in the last 72 hours  Misc. Labs:   Imaging results:  Dg Chest 2 View  02/01/2014   CLINICAL DATA:  Shortness of breath, chest pressure.  EXAM: CHEST  2 VIEW  COMPARISON:  DG CHEST 2 VIEW dated 09/21/2013; DG CHEST 2 VIEW dated 07/30/2013  FINDINGS: Stable cardiac and mediastinal contours. Unchanged coarse interstitial opacities right lung base. No large consolidative pulmonary opacities. Biapical emphysematous change. No pleural effusion or pneumothorax Regional skeleton is unremarkable.  IMPRESSION: Grossly similar coarse interstitial opacities right lung base may represent scarring and or atelectasis.   Electronically Signed   By:  Lovey Newcomer M.D.   On: 02/01/2014 11:36   Ct Angio Chest Aortic Dissect W &/or W/o  02/01/2014   CLINICAL DATA:  Chest pain.  Back pain.  EXAM: CT ANGIOGRAPHY CHEST, ABDOMEN AND PELVIS  TECHNIQUE: Multidetector CT imaging through the chest, abdomen and pelvis was performed using the standard protocol during bolus administration of intravenous contrast. Multiplanar reconstructed images and MIPs were obtained and reviewed to evaluate the vascular anatomy.  CONTRAST:  144mL OMNIPAQUE IOHEXOL 350 MG/ML SOLN  COMPARISON:  CT ANGIO CHEST W/CM &/OR WO/CM dated 09/27/2012; CT ABD/PELVIS W CM dated 06/04/2011  FINDINGS: CTA CHEST FINDINGS  No evidence of intramural hematoma involving the ascending or descending aorta. Mild atherosclerotic calcifications of the large.  No evidence of aortic dissection or aneurysm.  Innominate artery, right subclavian artery, right common carotid artery, right vertebral artery, left common carotid artery, left subclavian artery, left vertebral artery are patent within the confines of the exam.  No obvious filling defect in the pulmonary arterial tree to suggest acute pulmonary thromboembolism.  No abnormal mediastinal adenopathy. Small mediastinal  nodes are noted. Mild peribronchovascular soft tissue thickening about the hilar regions.  Severe emphysema. Stable left upper lobe 6 mm sub solid nodule on image 56. This supports benign etiology.  No acute bony deformity.  Review of the MIP images confirms the above findings.  CTA ABDOMEN AND PELVIS FINDINGS  Aorta is non aneurysmal and patent. No evidence of aortic dissection.  Celiac is patent.  Branch vessels are patent.  SMA is patent. Branch vessels are patent. Replaced right hepatic artery.  Single renal arteries are patent.  Atherosclerotic changes of the right common iliac artery are present including a small medial penetrating ulcer. Mild ectasia at the ulcer with a caliber of 18 mm. Right internal and external iliac arteries are patent.  Left common and external iliac arteries are patent. Moderate narrowing at the origin of the left internal iliac artery with some post stenotic dilatation. Left internal iliac artery is ectatic with a maximal caliber of 11 mm.  Proximal femoral arteries are patent  Stable right lobe hepatic hemangioma.  Gallbladder, spleen, pancreas, adrenal glands are within normal limits. Kidneys are unremarkable.  Bladder and prostate are unremarkable.  No free-fluid. No obvious retroperitoneal adenopathy. Small and multiple para-aortic nodes are present.  There is no vertebral compression deformity. Advanced degenerative changes in the lower lumbar spine are present characterize by degenerative disc disease and facet arthropathy. This could certainly be the cause of the patient's back pain.  Review of the MIP images confirms the above findings.  IMPRESSION: No evidence of thoracic or abdominal aortic dissection.  Severe emphysema.  Peribronchovascular soft tissues in the hilar regions likely represent chronic inflammation of the central airways.  Atherosclerotic changes of the iliac arteries as described. Moderate narrowing at the origin of the left internal iliac artery. Small  penetrating atherosclerotic ulcer in the right common iliac artery with mild ectasia.  Degenerative changes in the lower lumbar spine which could be the cause of the patient's back pain.   Electronically Signed   By: Maryclare Bean M.D.   On: 02/01/2014 13:32   Ct Angio Abd/pel W/ And/or W/o     Other results: EKG: Reviewed. I do not feel that he has any abnormal Q waves in the lateral leads.  Assessment: #1. Costochondritis versus generalized radiation of pain from previous back injury No evidence for acute cardiac or pulmonary injury.  Plan: We will continue conservative management. Trial  of topical nonsteroidals to the chest wall.

## 2014-02-02 DIAGNOSIS — B20 Human immunodeficiency virus [HIV] disease: Secondary | ICD-10-CM

## 2014-02-02 DIAGNOSIS — J449 Chronic obstructive pulmonary disease, unspecified: Secondary | ICD-10-CM

## 2014-02-02 DIAGNOSIS — R079 Chest pain, unspecified: Secondary | ICD-10-CM

## 2014-02-02 DIAGNOSIS — G8929 Other chronic pain: Secondary | ICD-10-CM

## 2014-02-02 DIAGNOSIS — R072 Precordial pain: Secondary | ICD-10-CM | POA: Diagnosis not present

## 2014-02-02 LAB — BASIC METABOLIC PANEL
BUN: 16 mg/dL (ref 6–23)
CO2: 27 mEq/L (ref 19–32)
Calcium: 8.8 mg/dL (ref 8.4–10.5)
Chloride: 102 mEq/L (ref 96–112)
Creatinine, Ser: 1.01 mg/dL (ref 0.50–1.35)
GFR calc Af Amer: >90 mL/min (ref >90)
GFR calc non Af Amer: 80 mL/min — ABNORMAL LOW (ref >90)
Glucose, Bld: 88 mg/dL (ref 70–99)
Potassium: 3.8 mEq/L (ref 3.7–5.3)
Sodium: 138 mEq/L (ref 137–147)

## 2014-02-02 LAB — CBC
HCT: 41.8 % (ref 39.0–52.0)
Hemoglobin: 13.7 g/dL (ref 13.0–17.0)
MCH: 27.1 pg (ref 26.0–34.0)
MCHC: 32.8 g/dL (ref 30.0–36.0)
MCV: 82.8 fL (ref 78.0–100.0)
Platelets: 263 10*3/uL (ref 150–400)
RBC: 5.05 MIL/uL (ref 4.22–5.81)
RDW: 15.3 % (ref 11.5–15.5)
WBC: 6.7 10*3/uL (ref 4.0–10.5)

## 2014-02-02 LAB — TROPONIN I: Troponin I: <0.3 ng/mL (ref <0.30)

## 2014-02-02 MED ORDER — DICLOFENAC SODIUM 1 % TD GEL: 2.0000 g | Freq: Four times a day (QID) | TRANSDERMAL | Status: DC

## 2014-02-02 NOTE — Progress Notes (Signed)
Subjective:  Chest pain much improved. Troponins normal.  Objective: Vital signs in last 24 hours: Filed Vitals:   02/01/14 1621 02/01/14 1622 02/01/14 2050 02/02/14 0513  BP: 136/96  121/83 126/83  Pulse: 65  69 62  Temp: 97.7 F (36.5 C)  97.5 F (36.4 C) 97.7 F (36.5 C)  TempSrc: Oral  Oral Oral  Resp: 18  16 16   Height:  5\' 8"  (1.727 m)    Weight:  160 lb (72.576 kg)    SpO2: 95%  94% 94%   Weight change:   Intake/Output Summary (Last 24 hours) at 02/02/14 1213 Last data filed at 02/01/14 1827  Gross per 24 hour  Intake    240 ml  Output      0 ml  Net    240 ml   Physical Exam  Constitutional: He is oriented to person, place, and time. He appears well-developed and well-nourished. No distress.  Cardiovascular: Normal rate, regular rhythm, normal heart sounds and intact distal pulses. Exam reveals no friction rub.  No murmur heard.  Pulmonary/Chest: Effort normal and breath sounds normal. No respiratory distress. He has no wheezes. He has no rales. He exhibits chest wall tenderness.  Abdominal: Soft. Bowel sounds are normal. He exhibits no distension. There is no tenderness. There is no rebound and no guarding.  Musculoskeletal: He exhibits chest wall  tenderness. He exhibits no edema.  Neurological: He is alert and oriented to person, place, and time.  Skin: He is not diaphoretic.    Lab Results: Basic Metabolic Panel:  Recent Labs Lab 02/01/14 1033 02/02/14 0110  NA 143 138  K 4.1 3.8  CL 105 102  CO2 27 27  GLUCOSE 75 88  BUN 16 16  CREATININE 0.97 1.01  CALCIUM 9.5 8.8    Recent Labs Lab 02/01/14 1530  LIPASE 23  CBC:  Recent Labs Lab 02/01/14 1033 02/02/14 0110  WBC 5.3 6.7  HGB 14.5 13.7  HCT 43.8 41.8  MCV 82.6 82.8  PLT 282 263   Cardiac Enzymes:  Recent Labs Lab 02/02/14 0110  TROPONINI <0.30   Urine Drug Screen: Drugs of Abuse     Component Value Date/Time   LABOPIA NONE DETECTED 09/21/2013 0932   LABOPIA NEG  05/02/2013 1602   COCAINSCRNUR NONE DETECTED 09/21/2013 0932   COCAINSCRNUR PPS 05/02/2013 1602   LABBENZ NONE DETECTED 09/21/2013 0932   LABBENZ NEG 05/02/2013 1602   AMPHETMU NONE DETECTED 09/21/2013 0932   THCU NONE DETECTED 09/21/2013 0932   LABBARB NONE DETECTED 09/21/2013 0932   LABBARB NEG 05/02/2013 1602    Urinalysis:  Recent Labs Lab 01/29/14 0426  COLORURINE YELLOW  LABSPEC 1.003*  PHURINE 5.5  GLUCOSEU NEGATIVE  HGBUR NEGATIVE  BILIRUBINUR NEGATIVE  KETONESUR NEGATIVE  PROTEINUR NEGATIVE  UROBILINOGEN 0.2  NITRITE NEGATIVE  LEUKOCYTESUR NEGATIVE   Studies/Results: Dg Chest 2 View  02/01/2014   CLINICAL DATA:  Shortness of breath, chest pressure.  EXAM: CHEST  2 VIEW  COMPARISON:  DG CHEST 2 VIEW dated 09/21/2013; DG CHEST 2 VIEW dated 07/30/2013  FINDINGS: Stable cardiac and mediastinal contours. Unchanged coarse interstitial opacities right lung base. No large consolidative pulmonary opacities. Biapical emphysematous change. No pleural effusion or pneumothorax Regional skeleton is unremarkable.  IMPRESSION: Grossly similar coarse interstitial opacities right lung base may represent scarring and or atelectasis.   Electronically Signed   By: Lovey Newcomer M.D.   On: 02/01/2014 11:36   Ct Angio Chest Aortic Dissect W &/or W/o  02/01/2014   CLINICAL DATA:  Chest pain.  Back pain.  EXAM: CT ANGIOGRAPHY CHEST, ABDOMEN AND PELVIS  TECHNIQUE: Multidetector CT imaging through the chest, abdomen and pelvis was performed using the standard protocol during bolus administration of intravenous contrast. Multiplanar reconstructed images and MIPs were obtained and reviewed to evaluate the vascular anatomy.  CONTRAST:  123mL OMNIPAQUE IOHEXOL 350 MG/ML SOLN  COMPARISON:  CT ANGIO CHEST W/CM &/OR WO/CM dated 09/27/2012; CT ABD/PELVIS W CM dated 06/04/2011  FINDINGS: CTA CHEST FINDINGS  No evidence of intramural hematoma involving the ascending or descending aorta. Mild atherosclerotic calcifications of the  large.  No evidence of aortic dissection or aneurysm.  Innominate artery, right subclavian artery, right common carotid artery, right vertebral artery, left common carotid artery, left subclavian artery, left vertebral artery are patent within the confines of the exam.  No obvious filling defect in the pulmonary arterial tree to suggest acute pulmonary thromboembolism.  No abnormal mediastinal adenopathy. Small mediastinal nodes are noted. Mild peribronchovascular soft tissue thickening about the hilar regions.  Severe emphysema. Stable left upper lobe 6 mm sub solid nodule on image 56. This supports benign etiology.  No acute bony deformity.  Review of the MIP images confirms the above findings.  CTA ABDOMEN AND PELVIS FINDINGS  Aorta is non aneurysmal and patent. No evidence of aortic dissection.  Celiac is patent.  Branch vessels are patent.  SMA is patent. Branch vessels are patent. Replaced right hepatic artery.  Single renal arteries are patent.  Atherosclerotic changes of the right common iliac artery are present including a small medial penetrating ulcer. Mild ectasia at the ulcer with a caliber of 18 mm. Right internal and external iliac arteries are patent.  Left common and external iliac arteries are patent. Moderate narrowing at the origin of the left internal iliac artery with some post stenotic dilatation. Left internal iliac artery is ectatic with a maximal caliber of 11 mm.  Proximal femoral arteries are patent  Stable right lobe hepatic hemangioma.  Gallbladder, spleen, pancreas, adrenal glands are within normal limits. Kidneys are unremarkable.  Bladder and prostate are unremarkable.  No free-fluid. No obvious retroperitoneal adenopathy. Small and multiple para-aortic nodes are present.  There is no vertebral compression deformity. Advanced degenerative changes in the lower lumbar spine are present characterize by degenerative disc disease and facet arthropathy. This could certainly be the cause of  the patient's back pain.  Review of the MIP images confirms the above findings.  IMPRESSION: No evidence of thoracic or abdominal aortic dissection.  Severe emphysema.  Peribronchovascular soft tissues in the hilar regions likely represent chronic inflammation of the central airways.  Atherosclerotic changes of the iliac arteries as described. Moderate narrowing at the origin of the left internal iliac artery. 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  Degenerative changes in the lower lumbar spine which could be the cause of the patient's back pain.   Electronically Signed   By: Maryclare Bean M.D.   On: 02/01/2014 13:32   Ct Angio Abd/pel W/ And/or W/o  02/01/2014   CLINICAL DATA:  Chest pain.  Back pain.  EXAM: CT ANGIOGRAPHY CHEST, ABDOMEN AND PELVIS  TECHNIQUE: Multidetector CT imaging through the chest, abdomen and pelvis was performed using the standard protocol during bolus administration of intravenous contrast. Multiplanar reconstructed images and MIPs were obtained and reviewed to evaluate the vascular anatomy.  CONTRAST:  167mL OMNIPAQUE IOHEXOL 350 MG/ML SOLN  COMPARISON:  CT ANGIO CHEST  W/CM &/OR WO/CM dated 09/27/2012; CT ABD/PELVIS W CM dated 06/04/2011  FINDINGS: CTA CHEST FINDINGS  No evidence of intramural hematoma involving the ascending or descending aorta. Mild atherosclerotic calcifications of the large.  No evidence of aortic dissection or aneurysm.  Innominate artery, right subclavian artery, right common carotid artery, right vertebral artery, left common carotid artery, left subclavian artery, left vertebral artery are patent within the confines of the exam.  No obvious filling defect in the pulmonary arterial tree to suggest acute pulmonary thromboembolism.  No abnormal mediastinal adenopathy. Small mediastinal nodes are noted. Mild peribronchovascular soft tissue thickening about the hilar regions.  Severe emphysema. Stable left upper lobe 6  mm sub solid nodule on image 56. This supports benign etiology.  No acute bony deformity.  Review of the MIP images confirms the above findings.  CTA ABDOMEN AND PELVIS FINDINGS  Aorta is non aneurysmal and patent. No evidence of aortic dissection.  Celiac is patent.  Branch vessels are patent.  SMA is patent. Branch vessels are patent. Replaced right hepatic artery.  Single renal arteries are patent.  Atherosclerotic changes of the right common iliac artery are present including a small medial penetrating ulcer. Mild ectasia at the ulcer with a caliber of 18 mm. Right internal and external iliac arteries are patent.  Left common and external iliac arteries are patent. Moderate narrowing at the origin of the left internal iliac artery with some post stenotic dilatation. Left internal iliac artery is ectatic with a maximal caliber of 11 mm.  Proximal femoral arteries are patent  Stable right lobe hepatic hemangioma.  Gallbladder, spleen, pancreas, adrenal glands are within normal limits. Kidneys are unremarkable.  Bladder and prostate are unremarkable.  No free-fluid. No obvious retroperitoneal adenopathy. Small and multiple para-aortic nodes are present.  There is no vertebral compression deformity. Advanced degenerative changes in the lower lumbar spine are present characterize by degenerative disc disease and facet arthropathy. This could certainly be the cause of the patient's back pain.  Review of the MIP images confirms the above findings.  IMPRESSION: No evidence of thoracic or abdominal aortic dissection.  Severe emphysema.  Peribronchovascular soft tissues in the hilar regions likely represent chronic inflammation of the central airways.  Atherosclerotic changes of the iliac arteries as described. Moderate narrowing at the origin of the left internal iliac artery. 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  Degenerative changes in the lower lumbar spine which  could be the cause of the patient's back pain.   Electronically Signed   By: Maryclare Bean M.D.   On: 02/01/2014 13:32   Medications: I have reviewed the patient's current medications. Scheduled Meds: . diclofenac sodium  2 g Topical QID  . elvitegravir-cobicistat-emtricitabine-tenofovir  1 tablet Oral Q breakfast  . heparin  5,000 Units Subcutaneous 3 times per day  . lidocaine  1 patch Transdermal Q24H  . sodium chloride  3 mL Intravenous Q12H   Continuous Infusions:  PRN Meds:.acetaminophen, acetaminophen, gi cocktail, ondansetron (ZOFRAN) IV, ondansetron, oxyCODONE-acetaminophen Assessment/Plan: Principal Problem:   Chest pain Active Problems:   HTN (hypertension)   COPD (chronic obstructive pulmonary disease) with emphysema   Chronic pain   Costochondritis   HIV (human immunodeficiency virus infection)  Chest Pain likely due to costochondritis  Tropins negative over 12 hours. Patient feel releived. Plan for discharge home with voltaren gel for costochondritis.   Chronic pain  Patient has failed to establish care with multiple pain clinics. He is repeatedly  positive for cocaine. I explained that we will be unable to provide him with narcotic pain medications as an outpatient.   COPD  No wheezing on PE. No home meds.   HIV  Suppressed with CD4 of 810.  -continue home meds    Dispo: D/c today.  The patient does have a current PCP (Juluis Mire, MD) and does need an [facility] hospital follow-up appointment after discharge.  The patient does not have transportation limitations that hinder transportation to clinic appointments.  .Services Needed at time of discharge: Y = Yes, Blank = No PT:   OT:   RN:   Equipment:   Other:     LOS: 1 day   Marrion Coy, MD 02/02/2014, 12:13 PM

## 2014-02-02 NOTE — Discharge Summary (Signed)
Name: Kenneth [name] MRN: [id number] DOB: 09-Jan-1956 58 y.o. PCP: Juluis Mire, MD  Date of Admission: 02/01/2014 10:04 AM Date of Discharge: 02/02/2014 Attending Physician: Annia Belt, MD  Discharge Diagnosis:  Principal Problem:   Chest pain Active Problems:   HTN (hypertension)   COPD (chronic obstructive pulmonary disease) with emphysema   Chronic pain   Costochondritis   HIV (human immunodeficiency virus infection)  Discharge Medications:   Medication List         amLODipine 10 MG tablet  Commonly known as:  NORVASC  Take 0.5 tablets (5 mg total) by mouth daily.     diclofenac sodium 1 % Gel  Commonly known as:  VOLTAREN  Apply 2 g topically 4 (four) times daily.     elvitegravir-cobicistat-emtricitabine-tenofovir 150-150-200-300 MG Tabs tablet  Commonly known as:  STRIBILD  Take 1 tablet by mouth daily with breakfast.     EPINEPHrine 0.3 mg/0.3 mL Soaj injection  Commonly known as:  EPI-PEN  Inject 0.3 mg into the muscle once as needed (Anaphylaxis).     multivitamin with minerals Tabs tablet  Take 1 tablet by mouth daily.     nitroGLYCERIN 0.4 MG SL tablet  Commonly known as:  NITROSTAT  Place 0.4 mg under the tongue every 5 (five) minutes as needed for chest pain.     oxyCODONE-acetaminophen 5-325 MG per tablet  Commonly known as:  PERCOCET/ROXICET  Take 1-2 tablets by mouth every 8 (eight) hours as needed for moderate pain or severe pain.     tiZANidine 4 MG tablet  Commonly known as:  ZANAFLEX  Take 4 mg by mouth every 6 (six) hours as needed for muscle spasms.        Disposition and follow-up:   [name] was discharged from [facility] in Stable condition.  At the hospital follow up visit please address:  1.  Chest Wall pain (costochondritis), Chronic Pain, Cocaine Abuse, Homelessness  2.  Labs / imaging needed at time of follow-up: None  3.  Pending labs/ test needing follow-up: None  Follow-up  Appointments:     Follow-up Information   Follow up with [facility], MD On 02/03/2014. (8:15 am)    Specialty:  Internal Medicine   Contact information:   [place] [address] [phone]       Discharge Instructions: Discharge Instructions   Call MD for:    Complete by:  As directed   New or worsening symptoms.     Diet - low sodium heart healthy    Complete by:  As directed      Discharge instructions    Complete by:  As directed   Your chest pain is likely from chest wall. Please use the voltaren gel and tylenol as needed for your pain. Please follow up with the appointment we made you tomorrow.     Increase activity slowly    Complete by:  As directed            Consultations:    Procedures Performed:  Dg Chest 2 View  02/01/2014   CLINICAL DATA:  Shortness of breath, chest pressure.  EXAM: CHEST  2 VIEW  COMPARISON:  DG CHEST 2 VIEW dated 09/21/2013; DG CHEST 2 VIEW dated 07/30/2013  FINDINGS: Stable cardiac and mediastinal contours. Unchanged coarse interstitial opacities right lung base. No large consolidative pulmonary opacities. Biapical emphysematous change. No pleural effusion or pneumothorax Regional skeleton is unremarkable.  IMPRESSION: Grossly similar coarse interstitial opacities right  lung base may represent scarring and or atelectasis.   Electronically Signed   By: Annia Belt M.D.   On: 02/01/2014 11:36   Dg Lumbar Spine Complete  01/29/2014   CLINICAL DATA:  Low back pain  EXAM: LUMBAR SPINE - COMPLETE 4+ VIEW  COMPARISON:  11/20/2013  FINDINGS: Vertebral body height is well maintained. Disc space narrowing is noted at L3-4, L4-5 and L5-S1. Facet hypertrophic changes are noted at these levels as well. No acute fracture is noted. No gross soft tissue abnormality is seen.  IMPRESSION: Degenerative change without acute abnormality.   Electronically Signed   By: Alcide Clever M.D.   On: 01/29/2014 02:29   Ct Angio Chest Aortic Dissect W  &/or W/o  02/01/2014   CLINICAL DATA:  Chest pain.  Back pain.  EXAM: CT ANGIOGRAPHY CHEST, ABDOMEN AND PELVIS  TECHNIQUE: Multidetector CT imaging through the chest, abdomen and pelvis was performed using the standard protocol during bolus administration of intravenous contrast. Multiplanar reconstructed images and MIPs were obtained and reviewed to evaluate the vascular anatomy.  CONTRAST:  OMNIPAQUE IOHEXOL 350 MG/ML SOLN  COMPARISON:  CT ANGIO CHEST W/CM &/OR WO/CM dated 09/27/2012; CT ABD/PELVIS W CM dated 06/04/2011  FINDINGS: CTA CHEST FINDINGS  No evidence of intramural hematoma involving the ascending or descending aorta. Mild atherosclerotic calcifications of the large.  No evidence of aortic dissection or aneurysm.  Innominate artery, right subclavian artery, right common carotid artery, right vertebral artery, left common carotid artery, left subclavian artery, left vertebral artery are patent within the confines of the exam.  No obvious filling defect in the pulmonary arterial tree to suggest acute pulmonary thromboembolism.  No abnormal mediastinal adenopathy. Small mediastinal nodes are noted. Mild peribronchovascular soft tissue thickening about the hilar regions.  Severe emphysema. Stable left upper lobe 6 mm sub solid nodule on image 56. This supports benign etiology.  No acute bony deformity.  Review of the MIP images confirms the above findings.  CTA ABDOMEN AND PELVIS FINDINGS  Aorta is non aneurysmal and patent. No evidence of aortic dissection.  Celiac is patent.  Branch vessels are patent.  SMA is patent. Branch vessels are patent. Replaced right hepatic artery.  Single renal arteries are patent.  Atherosclerotic changes of the right common iliac artery are present including a small medial penetrating ulcer. Mild ectasia at the ulcer with a caliber of 18 mm. Right internal and external iliac arteries are patent.  Left common and external iliac arteries are patent. Moderate narrowing at  the origin of the left internal iliac artery with some post stenotic dilatation. Left internal iliac artery is ectatic with a maximal caliber of 11 mm.  Proximal femoral arteries are patent  Stable right lobe hepatic hemangioma.  Gallbladder, spleen, pancreas, adrenal glands are within normal limits. Kidneys are unremarkable.  Bladder and prostate are unremarkable.  No free-fluid. No obvious retroperitoneal adenopathy. Small and multiple para-aortic nodes are present.  There is no vertebral compression deformity. Advanced degenerative changes in the lower lumbar spine are present characterize by degenerative disc disease and facet arthropathy. This could certainly be the cause of the patient's back pain.  Review of the MIP images confirms the above findings.  IMPRESSION: No evidence of thoracic or abdominal aortic dissection.  Severe emphysema.  Peribronchovascular soft tissues in the hilar regions likely represent chronic inflammation of the central airways.  Atherosclerotic changes of the iliac arteries as described. Moderate narrowing at the origin of the left internal iliac artery. Small  penetrating atherosclerotic ulcer in the right common iliac artery with mild ectasia.  Degenerative changes in the lower lumbar spine which could be the cause of the patient's back pain.   Electronically Signed   By: Maryclare Bean M.D.   On: 02/01/2014 13:32   Ct Angio Abd/pel W/ And/or W/o  02/01/2014   CLINICAL DATA:  Chest pain.  Back pain.  EXAM: CT ANGIOGRAPHY CHEST, ABDOMEN AND PELVIS  TECHNIQUE: Multidetector CT imaging through the chest, abdomen and pelvis was performed using the standard protocol during bolus administration of intravenous contrast. Multiplanar reconstructed images and MIPs were obtained and reviewed to evaluate the vascular anatomy.  CONTRAST:  OMNIPAQUE IOHEXOL 350 MG/ML SOLN  COMPARISON:  CT ANGIO CHEST W/CM &/OR WO/CM dated 09/27/2012; CT ABD/PELVIS W CM dated 06/04/2011  FINDINGS: CTA CHEST  FINDINGS  No evidence of intramural hematoma involving the ascending or descending aorta. Mild atherosclerotic calcifications of the large.  No evidence of aortic dissection or aneurysm.  Innominate artery, right subclavian artery, right common carotid artery, right vertebral artery, left common carotid artery, left subclavian artery, left vertebral artery are patent within the confines of the exam.  No obvious filling defect in the pulmonary arterial tree to suggest acute pulmonary thromboembolism.  No abnormal mediastinal adenopathy. Small mediastinal nodes are noted. Mild peribronchovascular soft tissue thickening about the hilar regions.  Severe emphysema. Stable left upper lobe 6 mm sub solid nodule on image 56. This supports benign etiology.  No acute bony deformity.  Review of the MIP images confirms the above findings.  CTA ABDOMEN AND PELVIS FINDINGS  Aorta is non aneurysmal and patent. No evidence of aortic dissection.  Celiac is patent.  Branch vessels are patent.  SMA is patent. Branch vessels are patent. Replaced right hepatic artery.  Single renal arteries are patent.  Atherosclerotic changes of the right common iliac artery are present including a small medial penetrating ulcer. Mild ectasia at the ulcer with a caliber of 18 mm. Right internal and external iliac arteries are patent.  Left common and external iliac arteries are patent. Moderate narrowing at the origin of the left internal iliac artery with some post stenotic dilatation. Left internal iliac artery is ectatic with a maximal caliber of 11 mm.  Proximal femoral arteries are patent  Stable right lobe hepatic hemangioma.  Gallbladder, spleen, pancreas, adrenal glands are within normal limits. Kidneys are unremarkable.  Bladder and prostate are unremarkable.  No free-fluid. No obvious retroperitoneal adenopathy. Small and multiple para-aortic nodes are present.  There is no vertebral compression deformity. Advanced degenerative changes in the  lower lumbar spine are present characterize by degenerative disc disease and facet arthropathy. This could certainly be the cause of the patient's back pain.  Review of the MIP images confirms the above findings.  IMPRESSION: No evidence of thoracic or abdominal aortic dissection.  Severe emphysema.  Peribronchovascular soft tissues in the hilar regions likely represent chronic inflammation of the central airways.  Atherosclerotic changes of the iliac arteries as described. Moderate narrowing at the origin of the left internal iliac artery. 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  Degenerative changes in the lower lumbar spine which could be the cause of the patient's back pain.   Electronically Signed   By: Maryclare Bean M.D.   On: 02/01/2014 13:32    Admission HPI: Kenneth [name] is a 58 y.o. man with a pmhx detailed below who presents with a cc of chest pain.  He was in his normal state of health until this am when he stood up and fest sharp anterior chest pain. He chronically has back pain. He denies SOB, but admits to recent coughing. He admits to recent use of cocaine 3 days ago. He denies new LE swelling. He denies fevers or chills.   Hospital Course by problem list: Principal Problem:   Chest pain Active Problems:   HTN (hypertension)   COPD (chronic obstructive pulmonary disease) with emphysema   Chronic pain   Costochondritis   HIV (human immunodeficiency virus infection)   Chest Pain likely due to costochondritis  Patient was ruled out for ACS. Chest pain was atypical for ACS. He was tender to palpation of the anteriro chest wall. CE's negative. CTA negative for PE and for aortic dissection. Discharged home with voltaren gel for costochondritis.   Chronic pain  Patient has failed to establish care with multiple pain clinics. He is repeatedly positive for cocaine. I explained that we will be unable to provide him with narcotic pain medications  as an outpatient.   COPD  - stable No wheezing on PE. No home meds.    HIV - stable Suppressed with CD4 of 810. Plan to continue home meds.   Discharge Vitals:   BP 145/87  Pulse 72  Temp(Src) 97.4 F (36.3 C) (Oral)  Resp 18  Ht 5\' 8"  (1.727 m)  Wt 160 lb (72.576 kg)  BMI 24.33 kg/m2  SpO2 94%  Discharge Labs:  Results for orders placed during the hospital encounter of 02/01/14 (from the past 24 hour(s))  LIPASE, BLOOD     Status: None   Collection Time    02/01/14  3:30 PM      Result Value Ref Range   Lipase 23  11 - 59 U/L  I-STAT TROPOININ, ED     Status: None   Collection Time    02/01/14  3:39 PM      Result Value Ref Range   Troponin i, poc 0.00  0.00 - 0.08 ng/mL   Comment 3           BASIC METABOLIC PANEL     Status: Abnormal   Collection Time    02/02/14  1:10 AM      Result Value Ref Range   Sodium 138  137 - 147 mEq/L   Potassium 3.8  3.7 - 5.3 mEq/L   Chloride 102  96 - 112 mEq/L   CO2 27  19 - 32 mEq/L   Glucose, Bld 88  70 - 99 mg/dL   BUN 16  6 - 23 mg/dL   Creatinine, Ser 1.01  0.50 - 1.35 mg/dL   Calcium 8.8  8.4 - 10.5 mg/dL   GFR calc non Af Amer 80 (*) >90 mL/min   GFR calc Af Amer >90  >90 mL/min  CBC     Status: None   Collection Time    02/02/14  1:10 AM      Result Value Ref Range   WBC 6.7  4.0 - 10.5 K/uL   RBC 5.05  4.22 - 5.81 MIL/uL   Hemoglobin 13.7  13.0 - 17.0 g/dL   HCT 41.8  39.0 - 52.0 %   MCV 82.8  78.0 - 100.0 fL   MCH 27.1  26.0 - 34.0 pg   MCHC 32.8  30.0 - 36.0 g/dL   RDW 15.3  11.5 - 15.5 %   Platelets 263  150 - 400 K/uL  TROPONIN I  Status: None   Collection Time    02/02/14  1:10 AM      Result Value Ref Range   Troponin I <0.30  <0.30 ng/mL    Signed: Marrion Coy, MD 02/02/2014, 3:22 PM   Time Spent on Discharge: 25 minutes Services Ordered on Discharge: None Equipment Ordered on Discharge: None

## 2014-02-02 NOTE — Discharge Instructions (Signed)

## 2014-02-03 ENCOUNTER — Ambulatory Visit (INDEPENDENT_AMBULATORY_CARE_PROVIDER_SITE_OTHER): Payer: Medicare Other | Admitting: Internal Medicine

## 2014-02-03 ENCOUNTER — Encounter: Payer: Self-pay | Admitting: Internal Medicine

## 2014-02-03 VITALS — BP 139/94 | HR 82 | Temp 97.5°F | Ht 68.0 in | Wt 156.2 lb

## 2014-02-03 DIAGNOSIS — M545 Low back pain, unspecified: Secondary | ICD-10-CM

## 2014-02-03 DIAGNOSIS — M940 Chondrocostal junction syndrome [Tietze]: Secondary | ICD-10-CM

## 2014-02-03 DIAGNOSIS — G8929 Other chronic pain: Secondary | ICD-10-CM

## 2014-02-03 DIAGNOSIS — I10 Essential (primary) hypertension: Secondary | ICD-10-CM

## 2014-02-03 NOTE — ED Provider Notes (Signed)
Medical screening examination/treatment/procedure(s) were performed by non-physician practitioner and as supervising physician I was immediately available for consultation/collaboration.   EKG Interpretation   Date/Time:  Sunday Feb 01 2014 09:59:15 EDT Ventricular Rate:  87 PR Interval:  192 QRS Duration: 92 QT Interval:  388 QTC Calculation: 466 R Axis:   -59 Text Interpretation:  Normal sinus rhythm Left anterior fascicular block  Possible Lateral infarct , age undetermined Abnormal ECG Confirmed by  Rogene Houston  MD, Kalim Kissel [phone]) on 02/01/2014 10:13:07 AM        Mervin Kung, MD 02/03/14 [phone]

## 2014-02-03 NOTE — Progress Notes (Signed)
Subjective:   Patient ID: Kenneth [name] male   DOB: [birth year] 58 y.o.   MRN: [id number]  HPI: [name] is a 58 y.o. with PMH significant for HIV with CD count 810 (March 2015), HTN, CHF, Chronic lower back pain with lumbar radiculopathy comes to the office for a hospital follow up.  Patient was admitted to teaching service for Chest pain R/O ACS on 02/01/14 and was discharged on 02/02/14. Patient was discharged with a diagnosis of costochondritis and was prescribed Voltaren gel 1%. Patient is here today for a hospital follow up. Patient denies any more chest pain but complains of chronic back pain.  Patient has chronic lower back pain and is followed by Dr. Clydell Hakim (Neurosurgery). Patient underwent a trial of temporary spinal cord stimulator for 5 days (? in feb/march 2015) and apparently his symptoms were improved and was planned to get spinal cord stimulator(SCS) implant surgery done by Dr. Maryjean Ka on 12/19/13. Apparently patient stated that he used cocaine couple of days prior to the surgery and hence the surgery was cancelled. Patient states that he didn't hear back from the neurosurgery.  Patient is been followed by [facility] in [place] by Dr. Manfred Shirts and he was tested positive for cocaine last month. Patient states that he was expelled from the pain clinic.Patient was seen in the ED for chronic lower back pain on 01/26/14 , 01/28/14 .  Patient reports that he has pain in the mid to lower back, radiating down his left leg. He also complains of occasional associated tingling and numbness. He denies any bowel or bladder incontinence or saddle anesthesia. He denies any fever, chills, body pains. He states the pain worsened by walking, bending and relieved by percocet.   He denies any other complaints.    Past Medical History  Diagnosis Date  . HIV positive 2004  . Hypertension   . Meningitis   . Coronary artery disease   . Chest pain at rest 09/23/2012  .  Chronic bronchitis     "q year last 5 yr or so" (09/24/2012)  . Exertional dyspnea   . Migraines   . Arthritis     "both shoulders" (09/24/2012)  . Chronic lower back pain   . Pneumonia   . Lumbar disc disease   . Iliac artery aneurysm     Right   Current Outpatient Prescriptions  Medication Sig Dispense Refill  . amLODipine (NORVASC) 10 MG tablet Take 0.5 tablets (5 mg total) by mouth daily.  30 tablet  10  . diclofenac sodium (VOLTAREN) 1 % GEL Apply 2 g topically 4 (four) times daily.  100 g  0  . elvitegravir-cobicistat-emtricitabine-tenofovir (STRIBILD) 150-150-200-300 MG TABS tablet Take 1 tablet by mouth daily with breakfast.  30 tablet  11  . EPINEPHrine (EPI-PEN) 0.3 mg/0.3 mL SOAJ Inject 0.3 mg into the muscle once as needed (Anaphylaxis).       . Multiple Vitamin (MULTIVITAMIN WITH MINERALS) TABS Take 1 tablet by mouth daily.      . nitroGLYCERIN (NITROSTAT) 0.4 MG SL tablet Place 0.4 mg under the tongue every 5 (five) minutes as needed for chest pain.       Marland Kitchen oxyCODONE-acetaminophen (PERCOCET/ROXICET) 5-325 MG per tablet Take 1-2 tablets by mouth every 8 (eight) hours as needed for moderate pain or severe pain.       Marland Kitchen tiZANidine (ZANAFLEX) 4 MG tablet Take 4 mg by mouth every 6 (six) hours as needed for muscle spasms.  No current facility-administered medications for this visit.   Family History  Problem Relation Age of Onset  . Heart disease Father   . Heart disease Brother   . Diabetes Brother    History   Social History  . Marital Status: Married    Spouse Name: N/A    Number of Children: N/A  . Years of Education: N/A   Social History Main Topics  . Smoking status: Current Every Day Smoker -- 0.50 packs/day for 45 years    Types: Cigarettes    Last Attempt to Quit: 09/24/2012  . Smokeless tobacco: Never Used     Comment: 09/24/2012 "been stopped now ~ 8 month".  3 per day now.  . Alcohol Use: No  . Drug Use: Yes    Special: Cocaine     Comment: last use  09/24/2012  . Sexual Activity: None     Comment: declined condoms   Other Topics Concern  . None   Social History Narrative  . None   Review of Systems: Pertinent items are noted in HPI. Objective:  Physical Exam: Filed Vitals:   02/03/14 0819  BP: 139/94  Pulse: 82  Temp: 97.5 F (36.4 C)  TempSrc: Oral  Height: 5\' 8"  (1.727 m)  Weight: 156 lb 3.2 oz (70.852 kg)  SpO2: 99%   Constitutional: Vital signs reviewed.  Patient is a well-developed and well-nourished and is in no acute distress and cooperative with exam. Alert and oriented x3.  Cardiovascular: RRR, S1 normal, S2 normal.  Pulmonary/Chest: normal respiratory effort, CTAB.  Musculoskeletal: Mild tenderness to palpation over the midline at the entire lumbar and upper sacral vertebrae. SLR test was limited and demonstrated pain, worse on the left compared to the right. Linear scar noted to the lumbosacral spine - healed well. Negative swelling, erythema, inflammation, lesions, sores, deformities noted to the lumbosacral spine.  Neurological: A&O x3, Strength is normal in upper extremities. Strength appears to be 4-5/5 in both extremities limited because of the pain.  Skin: Warm, dry and intact.  Psychiatric: Normal mood and affect.   Assessment & Plan:

## 2014-02-03 NOTE — Assessment & Plan Note (Signed)
Hospitalized for chest pain R/O ACS from 02/01/14 to 02/02/14 and was diagnosed with costochondritis after negative ECG and CE. Resolved.

## 2014-02-03 NOTE — Patient Instructions (Signed)
Follow up with Neurosurgery and find out about the surgery.

## 2014-02-03 NOTE — Assessment & Plan Note (Signed)
Reasonably well controlled in the setting of back pain and recent cocaine use.  Plans: Continue current management. Defer ACEI, BB therapy for now to the PCP.

## 2014-02-03 NOTE — Assessment & Plan Note (Signed)
CT imaging from 02/01/14 revealed Advanced degenerative changes in the lower lumbar spine with degenerative disc disease and facet arthropathy.  Reviewed records from Neurosurgery and [facility]. 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 Patient is followed by Neurosurgery (Dr. Clydell Hakim) and was supposed to get the SCS Implant surgery (Spinal cord stimulator) on 12/19/13. Surgery was cancelled as he used cocaine few days prior to the surgery.  Patient was followed by [facility] (Dr. Manfred Shirts) in [place] and the records indicate that the pain didn't respond to narcotics and muscle relaxants as they tried the SCS trial. Discussed with the attending regarding further management and plan.   Plans:  Recommended patient to call neurosurgery office and make an appointment to be seen and hopefully a surgery soon, as it has shown significant for the patient and [facility] pain [facility] clinic for all the records.  Patient was declined by [facility] in the past and from [facility]. (As per reviewing records in [facility]).  Patient continues to use Cocaine and a history of chronic cocaine abuse. Patient had already called a pain clinic at [facility] and he states that they will see him in July 2015 Patient states that he is allergic to hydrocodone, naproxen, tramadol, flexeril which leaves [place] only with Tylenol as the only option.  Recommended to take Tylenol 1-2 tablets as needed for pain and he is to follow up with neurosurgery for further management of his pain.

## 2014-02-03 NOTE — Progress Notes (Signed)
INTERNAL MEDICINE TEACHING ATTENDING ADDENDUM - Rocket Gunderson, MD: I reviewed and discussed with the resident Dr. Boggala, the patient's medical history, physical examination, diagnosis and results of pertinent tests and treatment and I agree with the patient's care as documented. 

## 2014-02-04 ENCOUNTER — Encounter (HOSPITAL_COMMUNITY): Payer: Self-pay | Admitting: Emergency Medicine

## 2014-02-04 ENCOUNTER — Emergency Department (HOSPITAL_COMMUNITY)
Admission: EM | Admit: 2014-02-04 | Discharge: 2014-02-04 | Disposition: A | Discharge status: Home / Self Care | Payer: Medicare Other | Attending: Emergency Medicine | Admitting: Emergency Medicine

## 2014-02-04 DIAGNOSIS — Z9889 Other specified postprocedural states: Secondary | ICD-10-CM | POA: Insufficient documentation

## 2014-02-04 DIAGNOSIS — Z981 Arthrodesis status: Secondary | ICD-10-CM | POA: Insufficient documentation

## 2014-02-04 DIAGNOSIS — Z79899 Other long term (current) drug therapy: Secondary | ICD-10-CM | POA: Insufficient documentation

## 2014-02-04 DIAGNOSIS — Z21 Asymptomatic human immunodeficiency virus [HIV] infection status: Secondary | ICD-10-CM | POA: Insufficient documentation

## 2014-02-04 DIAGNOSIS — Z791 Long term (current) use of non-steroidal anti-inflammatories (NSAID): Secondary | ICD-10-CM | POA: Insufficient documentation

## 2014-02-04 DIAGNOSIS — Z8701 Personal history of pneumonia (recurrent): Secondary | ICD-10-CM | POA: Insufficient documentation

## 2014-02-04 DIAGNOSIS — G8929 Other chronic pain: Secondary | ICD-10-CM | POA: Insufficient documentation

## 2014-02-04 DIAGNOSIS — Z8661 Personal history of infections of the central nervous system: Secondary | ICD-10-CM | POA: Insufficient documentation

## 2014-02-04 DIAGNOSIS — I10 Essential (primary) hypertension: Secondary | ICD-10-CM | POA: Insufficient documentation

## 2014-02-04 DIAGNOSIS — M545 Low back pain, unspecified: Secondary | ICD-10-CM | POA: Insufficient documentation

## 2014-02-04 DIAGNOSIS — Z8709 Personal history of other diseases of the respiratory system: Secondary | ICD-10-CM | POA: Insufficient documentation

## 2014-02-04 DIAGNOSIS — F17200 Nicotine dependence, unspecified, uncomplicated: Secondary | ICD-10-CM | POA: Insufficient documentation

## 2014-02-04 DIAGNOSIS — M51379 Other intervertebral disc degeneration, lumbosacral region without mention of lumbar back pain or lower extremity pain: Secondary | ICD-10-CM | POA: Insufficient documentation

## 2014-02-04 DIAGNOSIS — M5137 Other intervertebral disc degeneration, lumbosacral region: Secondary | ICD-10-CM | POA: Insufficient documentation

## 2014-02-04 DIAGNOSIS — M19019 Primary osteoarthritis, unspecified shoulder: Secondary | ICD-10-CM | POA: Insufficient documentation

## 2014-02-04 DIAGNOSIS — M549 Dorsalgia, unspecified: Secondary | ICD-10-CM

## 2014-02-04 DIAGNOSIS — I2510 Atherosclerotic heart disease of native coronary artery without angina pectoris: Secondary | ICD-10-CM | POA: Insufficient documentation

## 2014-02-04 MED ORDER — ONDANSETRON 4 MG PO TBDP
4.0000 mg | ORAL_TABLET | Freq: Once | ORAL | Status: AC
  Administered 2014-02-04: 4 mg via ORAL
  Filled 2014-02-04: qty 1

## 2014-02-04 MED ORDER — KETOROLAC TROMETHAMINE 60 MG/2ML IM SOLN
60.0000 mg | Freq: Once | INTRAMUSCULAR | Status: AC
  Administered 2014-02-04: 60 mg via INTRAMUSCULAR
  Filled 2014-02-04: qty 2

## 2014-02-04 MED ORDER — OXYCODONE-ACETAMINOPHEN 5-325 MG PO TABS
2.0000 | ORAL_TABLET | Freq: Once | ORAL | Status: AC
  Administered 2014-02-04: 2 via ORAL
  Filled 2014-02-04: qty 2

## 2014-02-04 MED ORDER — DEXAMETHASONE SODIUM PHOSPHATE 10 MG/ML IJ SOLN
10.0000 mg | Freq: Once | INTRAMUSCULAR | Status: AC
  Administered 2014-02-04: 10 mg via INTRAMUSCULAR
  Filled 2014-02-04: qty 1

## 2014-02-04 NOTE — ED Provider Notes (Signed)
CSN: [id number]     Arrival date & time 02/04/14  1517 History  This chart was scribed for Margarita Mail, PA, working with Tanna Furry, MD, by Marcha Dutton ED Scribe. This patient was seen in room TR09C/TR09C and the patient's care was started at 5:50 PM.    Chief Complaint  Patient presents with  . Back Pain    Patient is a 58 y.o. male presenting with back pain. The history is provided by the patient. No language interpreter was used.  Back Pain Location:  Sacro-iliac joint Quality:  Aching Radiates to:  Does not radiate Pain severity:  Moderate Pain is:  Same all the time Onset quality:  Gradual Timing:  Constant Progression:  Unchanged Chronicity:  Chronic Context: not emotional stress, not falling, not jumping from heights, not lifting heavy objects, not MCA, not MVA, not occupational injury, not physical stress, not recent illness and not recent injury   Relieved by:  Nothing Worsened by:  Movement, bending, sitting, twisting and lying down Ineffective treatments:  NSAIDs, bed rest and being still Associated symptoms: no abdominal pain, no abdominal swelling, no bladder incontinence, no bowel incontinence, no chest pain, no dysuria, no fever, no headaches, no leg pain, no numbness, no paresthesias, no pelvic pain, no perianal numbness, no tingling and no weakness   Risk factors comment:  H/o back surgery   HPI Comments: Kenneth [name] is a 58 y.o. male who presents to the Emergency Department complaining of back pain. He states he has changed his primary doctor. He presents with a h/o 4 surgeries on his back and is in the process of getting into a pain clinic within the next few weeks. Pt states he is allergic to tramadol and it makes his throat swell up. He states he has the same reaction to Advil. Pt reports aleve "burns his stomach." He denies using an acid reducer.   Past Medical History  Diagnosis Date  . HIV positive 2004  . Hypertension   . Meningitis   . Coronary  artery disease   . Chest pain at rest 09/23/2012  . Chronic bronchitis     "q year last 5 yr or so" (09/24/2012)  . Exertional dyspnea   . Migraines   . Arthritis     "both shoulders" (09/24/2012)  . Chronic lower back pain   . Pneumonia   . Lumbar disc disease   . Iliac artery aneurysm     Right    Past Surgical History  Procedure Laterality Date  . Intussusception repair  10/2011  . Posterior lumbar fusion  1995  . Elbow surgery  ~ 1997    "removed some stones; right" (09/24/2012)  . Appendectomy  2013  . Back surgery  2006    4 BACK SURGERIES  . Abdominal surgery    . Spine surgery      Injury to back 1995    Family History  Problem Relation Age of Onset  . Heart disease Father   . Heart disease Brother   . Diabetes Brother     History  Substance Use Topics  . Smoking status: Current Every Day Smoker -- 0.50 packs/day for 45 years    Types: Cigarettes    Last Attempt to Quit: 09/24/2012  . Smokeless tobacco: Never Used     Comment: 09/24/2012 "been stopped now ~ 8 month".  3 per day now.  . Alcohol Use: No    Review of Systems  Constitutional: Negative for fever, chills and activity  change.  HENT: Negative for congestion.   Respiratory: Negative for cough, shortness of breath and wheezing.   Cardiovascular: Negative for chest pain.  Gastrointestinal: Negative for nausea, vomiting, abdominal pain, diarrhea, constipation, blood in stool and bowel incontinence.  Genitourinary: Negative for bladder incontinence, dysuria, frequency, hematuria, flank pain, testicular pain and pelvic pain.  Musculoskeletal: Positive for back pain.  Neurological: Negative for tingling, weakness, numbness, headaches and paresthesias.    Allergies  Bee venom; Flexeril; Hydrocodone; Nabumetone; [name]; Tramadol; Ace inhibitors; and Other   Home Medications   Prior to Admission medications   Medication Sig Start Date End Date Taking? Authorizing Provider  amLODipine (NORVASC) 10 MG  tablet Take 0.5 tablets (5 mg total) by mouth daily. 01/27/14  Yes Carter Kitten, MD  diclofenac sodium (VOLTAREN) 1 % GEL Apply 2 g topically 4 (four) times daily. 02/02/14  Yes Marrion Coy, MD  elvitegravir-cobicistat-emtricitabine-tenofovir (STRIBILD) 150-150-200-300 MG TABS tablet Take 1 tablet by mouth daily with breakfast. 08/04/13  Yes Michel Bickers, MD  EPINEPHrine (EPI-PEN) 0.3 mg/0.3 mL SOAJ Inject 0.3 mg into the muscle once as needed (Anaphylaxis).  03/09/13  Yes Montine Circle, PA-C  nitroGLYCERIN (NITROSTAT) 0.4 MG SL tablet Place 0.4 mg under the tongue every 5 (five) minutes as needed for chest pain.  08/11/13  Yes Tonia Brooms, MD  oxyCODONE-acetaminophen (PERCOCET/ROXICET) 5-325 MG per tablet Take 1-2 tablets by mouth every 8 (eight) hours as needed for moderate pain or severe pain.    Yes Historical Provider, MD  tiZANidine (ZANAFLEX) 4 MG tablet Take 4 mg by mouth every 6 (six) hours as needed for muscle spasms.   Yes Historical Provider, MD    Triage Vitals: BP 138/96  Pulse 106  Temp(Src) 97.9 F (36.6 C) (Oral)  Resp 18  Ht 5\' 8"  (1.727 m)  Wt 156 lb (70.761 kg)  BMI 23.73 kg/m2  SpO2 99%   Physical Exam  Nursing note and vitals reviewed. Constitutional: He is oriented to person, place, and time. He appears well-developed and well-nourished. No distress.  Awake, alert, nontoxic appearance.  HENT:  Head: Normocephalic and atraumatic.  Eyes: EOM are normal. Right eye exhibits no discharge. Left eye exhibits no discharge.  Neck: Neck supple.  Pulmonary/Chest: Effort normal. He exhibits no tenderness.  Abdominal: Soft. There is no tenderness. There is no rebound.  Musculoskeletal: He exhibits no tenderness.  Baseline ROM, no obvious new focal weakness. Pt ambulatory. Pt wearing a back brace.  Neurological: He is alert and oriented to person, place, and time. He has normal strength and normal reflexes. Coordination and gait normal.  Mental status and motor  strength appears baseline for patient and situation.  Skin: Skin is warm and dry. No rash noted. He is not diaphoretic.  Psychiatric: He has a normal mood and affect.    ED Course  Procedures (including critical care time)   DIAGNOSTIC STUDIES: Oxygen Saturation is 99% on RA, normal by my interpretation.     COORDINATION OF CARE: 5:53 PM- Pt advised of plan for treatment and pt agrees.   Labs Review Labs Reviewed - No data to display   Imaging Review No results found.    EKG Interpretation None     MDM   Final diagnoses:  Chronic back pain greater than 3 months duration    Discussed ED chronic pain policy. Pain treated here acutely with decadron, toradol, and percocet. No pain meds at D/c  Patient with back pain.  No neurological deficits and normal neuro exam.  Patient  can walk but states is painful.  No loss of bowel or bladder control.  No concern for cauda equina.  No fever, night sweats, weight loss, h/o cancer, IVDU.  RICE protocol and pain medicine indicated and discussed with patient.    I personally performed the services described in this documentation, which was scribed in my presence. The recorded information has been reviewed and is accurate.    Margarita Mail, PA-C 02/04/14 1843

## 2014-02-04 NOTE — Discharge Instructions (Signed)
SEEK IMMEDIATE MEDICAL ATTENTION IF: New numbness, tingling, weakness, or problem with the use of your arms or legs.  Severe back pain not relieved with medications.  Change in bowel or bladder control.  Increasing pain in any areas of the body (such as chest or abdominal pain).  Shortness of breath, dizziness or fainting.  Nausea (feeling sick to your stomach), vomiting, fever, or sweats.   Chronic Pain Discharge Instructions  Emergency care providers appreciate that many patients coming to [place] are in severe pain and we wish to address their pain in the safest, most responsible manner.  It is important to recognize however, that the proper treatment of chronic pain differs from that of the pain of injuries and acute illnesses.  Our goal is to provide quality, safe, personalized care and we thank you for giving [place] the opportunity to serve you. The use of narcotics and related agents for chronic pain syndromes may lead to additional physical and psychological problems.  Nearly as many people die from prescription narcotics each year as die from car crashes.  Additionally, this risk is increased if such prescriptions are obtained from a variety of sources.  Therefore, only your primary care physician or a pain management specialist is able to safely treat such syndromes with narcotic medications long-term.    Documentation revealing such prescriptions have been sought from multiple sources may prohibit [place] from providing a refill or different narcotic medication.  Your name may be checked first through the [place].  This database is a record of controlled substance medication prescriptions that the patient has received.  This has been established by [facility] in an effort to eliminate the dangerous, and often life threatening, practice of obtaining multiple prescriptions from different medical providers.   If you have a chronic pain syndrome (i.e. chronic  headaches, recurrent back or neck pain, dental pain, abdominal or pelvis pain without a specific diagnosis, or neuropathic pain such as fibromyalgia) or recurrent visits for the same condition without an acute diagnosis, you may be treated with non-narcotics and other non-addictive medicines.  Allergic reactions or negative side effects that may be reported by a patient to such medications will not typically lead to the use of a narcotic analgesic or other controlled substance as an alternative.   Patients managing chronic pain with a personal physician should have provisions in place for breakthrough pain.  If you are in crisis, you should call your physician.  If your physician directs you to the emergency department, please have the doctor call and speak to our attending physician concerning your care.   When patients come to the Emergency Department (ED) with acute medical conditions in which the Emergency Department physician feels appropriate to prescribe narcotic or sedating pain medication, the physician will prescribe these in very limited quantities.  The amount of these medications will last only until you can see your primary care physician in his/her office.  Any patient who returns to the ED seeking refills should expect only non-narcotic pain medications.   In the event of an acute medical condition exists and the emergency physician feels it is necessary that the patient be given a narcotic or sedating medication -  a responsible adult driver should be present in the room prior to the medication being given by the nurse.   Prescriptions for narcotic or sedating medications that have been lost, stolen or expired will not be refilled in the Emergency Department.    Patients who  have chronic pain may receive non-narcotic prescriptions until seen by their primary care physician.  It is every patients personal responsibility to maintain active prescriptions with his or her primary care  physician or specialist.

## 2014-02-04 NOTE — ED Notes (Signed)
Pt presents with lower back pain x2-3 weeks radiating into his Left leg, pt ambulatory in triage, denies problems with bowel or bladder

## 2014-02-07 NOTE — ED Provider Notes (Signed)
Medical screening examination/treatment/procedure(s) were performed by non-physician practitioner and as supervising physician I was immediately available for consultation/collaboration.   EKG Interpretation None        Tanna Furry, MD 02/07/14 [phone]

## 2014-02-08 ENCOUNTER — Encounter (HOSPITAL_COMMUNITY): Payer: Self-pay | Admitting: Emergency Medicine

## 2014-02-08 ENCOUNTER — Emergency Department (HOSPITAL_COMMUNITY)
Admission: EM | Admit: 2014-02-08 | Discharge: 2014-02-08 | Disposition: A | Discharge status: Home / Self Care | Payer: Medicare Other | Attending: Emergency Medicine | Admitting: Emergency Medicine

## 2014-02-08 DIAGNOSIS — Z8701 Personal history of pneumonia (recurrent): Secondary | ICD-10-CM | POA: Insufficient documentation

## 2014-02-08 DIAGNOSIS — F17200 Nicotine dependence, unspecified, uncomplicated: Secondary | ICD-10-CM | POA: Insufficient documentation

## 2014-02-08 DIAGNOSIS — G8929 Other chronic pain: Secondary | ICD-10-CM

## 2014-02-08 DIAGNOSIS — G43909 Migraine, unspecified, not intractable, without status migrainosus: Secondary | ICD-10-CM | POA: Insufficient documentation

## 2014-02-08 DIAGNOSIS — Z8661 Personal history of infections of the central nervous system: Secondary | ICD-10-CM | POA: Insufficient documentation

## 2014-02-08 DIAGNOSIS — Z8709 Personal history of other diseases of the respiratory system: Secondary | ICD-10-CM | POA: Insufficient documentation

## 2014-02-08 DIAGNOSIS — I2510 Atherosclerotic heart disease of native coronary artery without angina pectoris: Secondary | ICD-10-CM | POA: Insufficient documentation

## 2014-02-08 DIAGNOSIS — Z79899 Other long term (current) drug therapy: Secondary | ICD-10-CM | POA: Insufficient documentation

## 2014-02-08 DIAGNOSIS — I10 Essential (primary) hypertension: Secondary | ICD-10-CM | POA: Insufficient documentation

## 2014-02-08 DIAGNOSIS — M549 Dorsalgia, unspecified: Secondary | ICD-10-CM

## 2014-02-08 DIAGNOSIS — R61 Generalized hyperhidrosis: Secondary | ICD-10-CM | POA: Insufficient documentation

## 2014-02-08 DIAGNOSIS — M545 Low back pain, unspecified: Secondary | ICD-10-CM | POA: Insufficient documentation

## 2014-02-08 DIAGNOSIS — Z21 Asymptomatic human immunodeficiency virus [HIV] infection status: Secondary | ICD-10-CM | POA: Insufficient documentation

## 2014-02-08 DIAGNOSIS — M51379 Other intervertebral disc degeneration, lumbosacral region without mention of lumbar back pain or lower extremity pain: Secondary | ICD-10-CM | POA: Insufficient documentation

## 2014-02-08 DIAGNOSIS — M5137 Other intervertebral disc degeneration, lumbosacral region: Secondary | ICD-10-CM | POA: Insufficient documentation

## 2014-02-08 DIAGNOSIS — Z791 Long term (current) use of non-steroidal anti-inflammatories (NSAID): Secondary | ICD-10-CM | POA: Insufficient documentation

## 2014-02-08 DIAGNOSIS — M25519 Pain in unspecified shoulder: Secondary | ICD-10-CM | POA: Insufficient documentation

## 2014-02-08 MED ORDER — OXYCODONE-ACETAMINOPHEN 5-325 MG PO TABS
2.0000 | ORAL_TABLET | Freq: Once | ORAL | Status: AC
  Administered 2014-02-08: 2 via ORAL
  Filled 2014-02-08: qty 2

## 2014-02-08 NOTE — Discharge Instructions (Signed)
Chronic Back Pain   When back pain lasts longer than 3 months, it is called chronic back pain.People with chronic back pain often go through certain periods that are more intense (flare-ups).   CAUSES  Chronic back pain can be caused by wear and tear (degeneration) on different structures in your back. These structures include:   The bones of your spine (vertebrae) and the joints surrounding your spinal cord and nerve roots (facets).   The strong, fibrous tissues that connect your vertebrae (ligaments).  Degeneration of these structures may result in pressure on your nerves. This can lead to constant pain.  HOME CARE INSTRUCTIONS   Avoid bending, heavy lifting, prolonged sitting, and activities which make the problem worse.   Take brief periods of rest throughout the day to reduce your pain. Lying down or standing usually is better than sitting while you are resting.   Take over-the-counter or prescription medicines only as directed by your caregiver.  SEEK IMMEDIATE MEDICAL CARE IF:    You have weakness or numbness in one of your legs or feet.   You have trouble controlling your bladder or bowels.   You have nausea, vomiting, abdominal pain, shortness of breath, or fainting.  Document Released: 10/12/2004 Document Revised: 11/27/2011 Document Reviewed: 08/19/2011  [employer] Patient Information 2014 [employer].

## 2014-02-08 NOTE — ED Provider Notes (Signed)
CSN: [id number]     Arrival date & time 02/08/14  1629 History  This chart was scribed for non-physician practitioner working with Blanchard Kelch, MD by Mercy Moore, ED Scribe. This patient was seen in room WTR8/WTR8 and the patient's care was started at 5:01 PM.   Chief Complaint  Patient presents with  . Back Pain     HPI HPI Comments: Kenneth [name] is a 58 y.o. male with history of chronic back pain who presents to the Emergency Department complaining of constant lower back pain on going for three weeks. Reports pain has worsened today with radiating, shooting pain down lower left leg. Patient rates pain at 10/10. Patient denies recent injury or causative trauma. Patient reports night sweats. Patient denies changes in bowel/bladder habits, numbness or weakness, or fever.    Patient shares being released from previous pain clinic, in April, because he would not perform instructed exercises. Patient states the exercises were too painful. Patient has pending appointment with pain clinic in [place] in July. Since leaving the pain clinic patient has not taken any medication for his chronic pain. Prescription: Oxycodone 10mg .    Past Medical History  Diagnosis Date  . HIV positive 2004  . Hypertension   . Meningitis   . Coronary artery disease   . Chest pain at rest 09/23/2012  . Chronic bronchitis     "q year last 5 yr or so" (09/24/2012)  . Exertional dyspnea   . Migraines   . Arthritis     "both shoulders" (09/24/2012)  . Chronic lower back pain   . Pneumonia   . Lumbar disc disease   . Iliac artery aneurysm     Right   Past Surgical History  Procedure Laterality Date  . Intussusception repair  10/2011  . Posterior lumbar fusion  1995  . Elbow surgery  ~ 1997    "removed some stones; right" (09/24/2012)  . Appendectomy  2013  . Back surgery  2006    4 BACK SURGERIES  . Abdominal surgery    . Spine surgery      Injury to back 1995   Family History  Problem Relation Age  of Onset  . Heart disease Father   . Heart disease Brother   . Diabetes Brother    History  Substance Use Topics  . Smoking status: Current Every Day Smoker -- 0.50 packs/day for 45 years    Types: Cigarettes    Last Attempt to Quit: 09/24/2012  . Smokeless tobacco: Never Used     Comment: 09/24/2012 "been stopped now ~ 8 month".  3 per day now.  . Alcohol Use: No    Review of Systems  Constitutional: Negative for fever.  Gastrointestinal: Negative for nausea, vomiting and diarrhea.  Genitourinary: Negative for dysuria.  Musculoskeletal: Positive for back pain.  All other systems reviewed and are negative.     Allergies  Bee venom; Flexeril; Hydrocodone; Nabumetone; [name]; Tramadol; Ace inhibitors; and Other  Home Medications   Prior to Admission medications   Medication Sig Start Date End Date Taking? Authorizing Provider  amLODipine (NORVASC) 10 MG tablet Take 0.5 tablets (5 mg total) by mouth daily. 01/27/14   Carter Kitten, MD  diclofenac sodium (VOLTAREN) 1 % GEL Apply 2 g topically 4 (four) times daily. 02/02/14   Marrion Coy, MD  elvitegravir-cobicistat-emtricitabine-tenofovir (STRIBILD) 150-150-200-300 MG TABS tablet Take 1 tablet by mouth daily with breakfast. 08/04/13   Michel Bickers, MD  EPINEPHrine (EPI-PEN) 0.3 mg/0.3 mL SOAJ  Inject 0.3 mg into the muscle once as needed (Anaphylaxis).  03/09/13   Montine Circle, PA-C  nitroGLYCERIN (NITROSTAT) 0.4 MG SL tablet Place 0.4 mg under the tongue every 5 (five) minutes as needed for chest pain.  08/11/13   Tonia Brooms, MD  oxyCODONE-acetaminophen (PERCOCET/ROXICET) 5-325 MG per tablet Take 1-2 tablets by mouth every 8 (eight) hours as needed for moderate pain or severe pain.     Historical Provider, MD  tiZANidine (ZANAFLEX) 4 MG tablet Take 4 mg by mouth every 6 (six) hours as needed for muscle spasms.    Historical Provider, MD   Triage Vitals: BP 143/97  Pulse 88  Temp(Src) 98.3 F (36.8 C) (Oral)   Resp 16  SpO2 97% Physical Exam  Nursing note and vitals reviewed. Constitutional: He is oriented to person, place, and time. He appears well-developed and well-nourished. No distress.  HENT:  Head: Normocephalic and atraumatic.  Eyes: EOM are normal.  Neck: Neck supple. No tracheal deviation present.  Cardiovascular: Normal rate.   Pulmonary/Chest: Effort normal. No respiratory distress.  Musculoskeletal: Normal range of motion.  Generalized lumbar spine tenderness  Neurological: He is alert and oriented to person, place, and time. He exhibits normal muscle tone. Coordination normal.  Skin: Skin is warm and dry.  Psychiatric: He has a normal mood and affect. His behavior is normal.    ED Course  Procedures (including critical care time) DIAGNOSTIC STUDIES: Oxygen Saturation is 97% on room air, normal by my interpretation.    COORDINATION OF CARE: 5:10 PM- Discussed treatment plan with patient at bedside and patient agreed to plan.     Labs Review Labs Reviewed - No data to display  Imaging Review No results found.   EKG Interpretation None      MDM   Final diagnoses:  Chronic back pain    No loss of bowel or bladder control. No concern for cauda equina. No fever, night sweats, weight loss, h/o cancer, IVDU. Patient stable for discharge. Patient instructed to follow up with Pain Management. Return precautions given. Reviewing the chart pt was discharged from the pain clinic because of cocaine abuse. Pt given 2 pills here for pain   I personally performed the services described in this documentation, which was scribed in my presence. The recorded information has been reviewed and is accurate.    Glendell Docker, NP 02/08/14 1718

## 2014-02-08 NOTE — ED Notes (Signed)
Pt A+Ox4, presents with c/o acute on chronic back pain, 10/10.  Pt reports has been constant x3 weeks, but worse today.  Pt reports shooting pain down LLE.  Pt denies n/t to extremities.  Pt denies new injuries.  Pt denies bowel/bladder changes or complaints.  Skin PWD.  Speaking full/clear sentences.  Pt appears uncomfortable.  Pt sts has not taken any medications for pain.  NAD.

## 2014-02-08 NOTE — ED Provider Notes (Signed)
Medical screening examination/treatment/procedure(s) were performed by non-physician practitioner and as supervising physician I was immediately available for consultation/collaboration.   EKG Interpretation None        Blanchard Kelch, MD 02/08/14 [phone]

## 2014-02-14 ENCOUNTER — Emergency Department (HOSPITAL_COMMUNITY)
Admission: EM | Admit: 2014-02-14 | Discharge: 2014-02-14 | Disposition: A | Discharge status: Home / Self Care | Payer: Medicare Other | Attending: Emergency Medicine | Admitting: Emergency Medicine

## 2014-02-14 ENCOUNTER — Encounter (HOSPITAL_COMMUNITY): Payer: Self-pay | Admitting: Emergency Medicine

## 2014-02-14 DIAGNOSIS — M549 Dorsalgia, unspecified: Secondary | ICD-10-CM

## 2014-02-14 DIAGNOSIS — G8929 Other chronic pain: Secondary | ICD-10-CM

## 2014-02-14 DIAGNOSIS — F17200 Nicotine dependence, unspecified, uncomplicated: Secondary | ICD-10-CM | POA: Insufficient documentation

## 2014-02-14 DIAGNOSIS — Z8661 Personal history of infections of the central nervous system: Secondary | ICD-10-CM | POA: Insufficient documentation

## 2014-02-14 DIAGNOSIS — Z8739 Personal history of other diseases of the musculoskeletal system and connective tissue: Secondary | ICD-10-CM | POA: Insufficient documentation

## 2014-02-14 DIAGNOSIS — M545 Low back pain, unspecified: Secondary | ICD-10-CM | POA: Insufficient documentation

## 2014-02-14 DIAGNOSIS — Z8701 Personal history of pneumonia (recurrent): Secondary | ICD-10-CM | POA: Insufficient documentation

## 2014-02-14 DIAGNOSIS — Z21 Asymptomatic human immunodeficiency virus [HIV] infection status: Secondary | ICD-10-CM | POA: Insufficient documentation

## 2014-02-14 DIAGNOSIS — I2510 Atherosclerotic heart disease of native coronary artery without angina pectoris: Secondary | ICD-10-CM | POA: Insufficient documentation

## 2014-02-14 DIAGNOSIS — Z79899 Other long term (current) drug therapy: Secondary | ICD-10-CM | POA: Insufficient documentation

## 2014-02-14 DIAGNOSIS — I10 Essential (primary) hypertension: Secondary | ICD-10-CM | POA: Insufficient documentation

## 2014-02-14 MED ORDER — OXYCODONE-ACETAMINOPHEN 5-325 MG PO TABS
2.0000 | ORAL_TABLET | Freq: Once | ORAL | Status: AC
  Administered 2014-02-14: 2 via ORAL
  Filled 2014-02-14: qty 2

## 2014-02-14 NOTE — ED Provider Notes (Signed)
CSN: [id number]     Arrival date & time 02/14/14  1216 History  This chart was scribed for non-physician practitioner, Mercy Moore, PA-C,working with Delice Bison Ward, DO, by Marlowe Kays, ED Scribe.  This patient was seen in room TR07C/TR07C and the patient's care was started at 12:40 PM.  Chief Complaint  Patient presents with  . Back Pain   The history is provided by the patient. No language interpreter was used.   HPI Comments:  Kenneth [name] is a 58 y.o. male with h/o HIV, HTN, CAD, arthritis, and chronic back pain who presents to the Emergency Department complaining of severe chronic unchanged back pain that has been going on for months. He states the pain is located in his left lower back and radiates down his left leg. He states the pain worsens with movement. Pt reports taking Tylenol for the pain last night with no relief. He states his pain is normally relieved by Percocet. He states he no longer goes to the pain management clinic he used to go to. He denies CP, abdominal pain, weakness of BLE, SOB, fever, loss of bowel or bladder incontinence, dysuria or swelling of the lower extremities. He denies h/o cancer or IV drug use. He states he has h/o back surgeries. Pt reports having an appt with a new pain management clinic in July. His surgeon is Dr. Maryjean Ka and his PCP is Dr. Eyvonne Mechanic. Pt was ambulatory to triage without issue.   Past Medical History  Diagnosis Date  . HIV positive 2004  . Hypertension   . Meningitis   . Coronary artery disease   . Chest pain at rest 09/23/2012  . Chronic bronchitis     "q year last 5 yr or so" (09/24/2012)  . Exertional dyspnea   . Migraines   . Arthritis     "both shoulders" (09/24/2012)  . Chronic lower back pain   . Pneumonia   . Lumbar disc disease   . Iliac artery aneurysm     Right   Past Surgical History  Procedure Laterality Date  . Intussusception repair  10/2011  . Posterior lumbar fusion  1995  . Elbow surgery  ~ 1997     "removed some stones; right" (09/24/2012)  . Appendectomy  2013  . Back surgery  2006    4 BACK SURGERIES  . Abdominal surgery    . Spine surgery      Injury to back 1995   Family History  Problem Relation Age of Onset  . Heart disease Father   . Heart disease Brother   . Diabetes Brother    History  Substance Use Topics  . Smoking status: Current Every Day Smoker -- 0.50 packs/day for 45 years    Types: Cigarettes    Last Attempt to Quit: 09/24/2012  . Smokeless tobacco: Never Used     Comment: 09/24/2012 "been stopped now ~ 8 month".  3 per day now.  . Alcohol Use: No    Review of Systems  Constitutional: Negative for fever, chills, activity change, appetite change and fatigue.  Respiratory: Negative for cough and shortness of breath.   Cardiovascular: Negative for chest pain and leg swelling.  Gastrointestinal: Negative for nausea, vomiting and abdominal pain.  Genitourinary: Negative for dysuria, decreased urine volume and difficulty urinating.  Musculoskeletal: Positive for back pain. Negative for gait problem, myalgias, neck pain and neck stiffness.  Skin: Negative for wound.  Neurological: Negative for dizziness, weakness, light-headedness, numbness and headaches.  All other  systems reviewed and are negative.   Allergies  Bee venom; Flexeril; Hydrocodone; Nabumetone; [name]; Tramadol; Ace inhibitors; and Other  Home Medications   Prior to Admission medications   Medication Sig Start Date End Date Taking? Authorizing Provider  amLODipine (NORVASC) 10 MG tablet Take 0.5 tablets (5 mg total) by mouth daily. 01/27/14   Carter Kitten, MD  diclofenac sodium (VOLTAREN) 1 % GEL Apply 2 g topically 4 (four) times daily. 02/02/14   Marrion Coy, MD  elvitegravir-cobicistat-emtricitabine-tenofovir (STRIBILD) 150-150-200-300 MG TABS tablet Take 1 tablet by mouth daily with breakfast. 08/04/13   Michel Bickers, MD  EPINEPHrine (EPI-PEN) 0.3 mg/0.3 mL SOAJ Inject 0.3 mg  into the muscle once as needed (Anaphylaxis).  03/09/13   Montine Circle, PA-C  nitroGLYCERIN (NITROSTAT) 0.4 MG SL tablet Place 0.4 mg under the tongue every 5 (five) minutes as needed for chest pain.  08/11/13   Tonia Brooms, MD  oxyCODONE-acetaminophen (PERCOCET/ROXICET) 5-325 MG per tablet Take 1-2 tablets by mouth every 8 (eight) hours as needed for moderate pain or severe pain.     Historical Provider, MD  tiZANidine (ZANAFLEX) 4 MG tablet Take 4 mg by mouth every 6 (six) hours as needed for muscle spasms.    Historical Provider, MD   Triage Vitals: BP 128/96  Pulse 105  Temp(Src) 97.5 F (36.4 C) (Oral)  Resp 18  SpO2 97%  Filed Vitals:   02/14/14 1218  BP: 128/96  Pulse: 105  Temp: 97.5 F (36.4 C)  TempSrc: Oral  Resp: 18  SpO2: 97%    Physical Exam  Nursing note and vitals reviewed. Constitutional: He is oriented to person, place, and time. He appears well-developed and well-nourished. No distress.  Non-toxic   HENT:  Head: Normocephalic and atraumatic.  Right Ear: External ear normal.  Left Ear: External ear normal.  Nose: Nose normal.  Mouth/Throat: Oropharynx is clear and moist. No oropharyngeal exudate.  Eyes: Conjunctivae and EOM are normal. Right eye exhibits no discharge. Left eye exhibits no discharge.  Neck: Normal range of motion. Neck supple.  No cervical spinal or paraspinal tenderness to palpation throughout.  No limitations with neck ROM.    Cardiovascular: Normal rate, regular rhythm, normal heart sounds and intact distal pulses.  Exam reveals no gallop and no friction rub.   No murmur heard. Dorsalis pedis pulses present and equal bilaterally  Pulmonary/Chest: Effort normal and breath sounds normal. No respiratory distress. He has no wheezes. He has no rales. He exhibits no tenderness.  Abdominal: Soft. He exhibits no distension. There is no tenderness.  Musculoskeletal: Normal range of motion. He exhibits tenderness. He exhibits no edema.        Arms: Tenderness to palpation to the left paraspinal muscles diffusely. No lumbar spinal tenderness. Strength 5/5 in the upper and lower extremities bilaterally. Positive straight leg raise on the left. Patient able to ambulate without difficulty or ataxia. No LE edema or calf tenderness bilaterally  Neurological: He is alert and oriented to person, place, and time.  Gross sensation intact in the LE. Patellar reflexes intact bilaterally  Skin: Skin is warm and dry. He is not diaphoretic.  Psychiatric: He has a normal mood and affect. His behavior is normal.    ED Course  Procedures (including critical care time) DIAGNOSTIC STUDIES: Oxygen Saturation is 97% on RA, normal by my interpretation.   COORDINATION OF CARE: 12:46 PM- Will provide Percocet prior to discharge and advised pt to follow up with his PCP on Monday. Pt verbalizes understanding  and agrees to plan.  Medications  oxyCODONE-acetaminophen (PERCOCET/ROXICET) 5-325 MG per tablet 2 tablet (2 tablets Oral Given 02/14/14 1251)    Labs Review Labs Reviewed - No data to display  Imaging Review No results found.   EKG Interpretation None      MDM   Kenneth [name] is a 58 y.o. male with h/o HIV, HTN, CAD, arthritis, and chronic back pain who presents to the Emergency Department complaining of severe chronic unchanged back pain that has been going on for months. Patient has had multiple ED visits for this in the past. No warning signs or symptoms of back pain including loss of bowel or bladder control, night sweats, waking from sleep with back pain, unexplained fevers or weight loss, history of cancer, or IV drug use. No concern for cauda equina, epidural abscess, or other serious/life threatening cause of back pain. Patient afebrile and non-toxic in appearance. Patient neurovascularly intact. Patient instructed to follow-up with his surgeon and pain management specialist. Did not provide narcotic pain medication. Return precautions,  discharge instructions, and follow-up was discussed with the patient before discharge.     Discharge Medication List as of 02/14/2014 12:50 PM      Final impressions: 1. Chronic back pain      Mercy Moore PA-C   I personally performed the services described in this documentation, which was scribed in my presence. The recorded information has been reviewed and is accurate.    Lucila Maine, PA-C 02/17/14 [phone]

## 2014-02-14 NOTE — Discharge Instructions (Signed)
- Return to the emergency department if you develop any changing/worsening condition, fever, loss of bowel/bladder function, weakness, loss of sensation, or any other concerns (please read additional information regarding your condition below) - Follow-up at your pain management appointment in July and call your surgeon on Monday    Back Pain, Adult Low back pain is very common. About 1 in 5 people have back pain.The cause of low back pain is rarely dangerous. The pain often gets better over time.About half of people with a sudden onset of back pain feel better in just 2 weeks. About 8 in 10 people feel better by 6 weeks.  CAUSES Some common causes of back pain include:  Strain of the muscles or ligaments supporting the spine.  Wear and tear (degeneration) of the spinal discs.  Arthritis.  Direct injury to the back. DIAGNOSIS Most of the time, the direct cause of low back pain is not known.However, back pain can be treated effectively even when the exact cause of the pain is unknown.Answering your caregiver's questions about your overall health and symptoms is one of the most accurate ways to make sure the cause of your pain is not dangerous. If your caregiver needs more information, he or she may order lab work or imaging tests (X-rays or MRIs).However, even if imaging tests show changes in your back, this usually does not require surgery. HOME CARE INSTRUCTIONS For many people, back pain returns.Since low back pain is rarely dangerous, it is often a condition that people can learn to [facility] their own.   Remain active. It is stressful on the back to sit or stand in one place. Do not sit, drive, or stand in one place for more than 30 minutes at a time. Take short walks on level surfaces as soon as pain allows.Try to increase the length of time you walk each day.  Do not stay in bed.Resting more than 1 or 2 days can delay your recovery.  Do not avoid exercise or work.Your body is  made to move.It is not dangerous to be active, even though your back may hurt.Your back will likely heal faster if you return to being active before your pain is gone.  Pay attention to your body when you bend and lift. Many people have less discomfortwhen lifting if they bend their knees, keep the load close to their bodies,and avoid twisting. Often, the most comfortable positions are those that put less stress on your recovering back.  Find a comfortable position to sleep. Use a firm mattress and lie on your side with your knees slightly bent. If you lie on your back, put a pillow under your knees.  Only take over-the-counter or prescription medicines as directed by your caregiver. Over-the-counter medicines to reduce pain and inflammation are often the most helpful.Your caregiver may prescribe muscle relaxant drugs.These medicines help dull your pain so you can more quickly return to your normal activities and healthy exercise.  Put ice on the injured area.  Put ice in a plastic bag.  Place a towel between your skin and the bag.  Leave the ice on for 15-20 minutes, 03-04 times a day for the first 2 to 3 days. After that, ice and heat may be alternated to reduce pain and spasms.  Ask your caregiver about trying back exercises and gentle massage. This may be of some benefit.  Avoid feeling anxious or stressed.Stress increases muscle tension and can worsen back pain.It is important to recognize when you are anxious or  stressed and learn ways to manage it.Exercise is a great option. SEEK MEDICAL CARE IF:  You have pain that is not relieved with rest or medicine.  You have pain that does not improve in 1 week.  You have new symptoms.  You are generally not feeling well. SEEK IMMEDIATE MEDICAL CARE IF:   You have pain that radiates from your back into your legs.  You develop new bowel or bladder control problems.  You have unusual weakness or numbness in your arms or  legs.  You develop nausea or vomiting.  You develop abdominal pain.  You feel faint. Document Released: 09/04/2005 Document Revised: 03/05/2012 Document Reviewed: 01/23/2011 [facility] Patient Information 2014 [place], [place].   Emergency Department Resource Guide 1) Find a Doctor and Pay Out of Pocket Although you won't have to find out who is covered by your insurance plan, it is a good idea to ask around and get recommendations. You will then need to call the office and see if the doctor you have chosen will accept you as a new patient and what types of options they offer for patients who are self-pay. Some doctors offer discounts or will set up payment plans for their patients who do not have insurance, but you will need to ask so you aren't surprised when you get to your appointment.  2) Contact Your Local Health Department Not all health departments have doctors that can see patients for sick visits, but many do, so it is worth a call to see if yours does. If you don't know where your local health department is, you can check in your phone book. The CDC also has a tool to help you locate your state's health department, and many state websites also have listings of all of their local health departments.  3) Find a Walk-in Clinic If your illness is not likely to be very severe or complicated, you may want to try a walk in clinic. These are popping up all over the country in pharmacies, drugstores, and shopping centers. They're usually staffed by nurse practitioners or physician assistants that have been trained to treat common illnesses and complaints. They're usually fairly quick and inexpensive. However, if you have serious medical issues or chronic medical problems, these are probably not your best option.  No Primary Care Doctor: - Call Health Connect at  [phone] - they can help you locate a primary care doctor that  accepts your insurance, provides certain services, etc. - Physician  Referral Service- [phone]  Chronic Pain Problems: Organization         Address  Phone   Notes  [facility]  [phone] Patients need to be referred by their primary care doctor.   Medication Assistance: Organization         Address  Phone   Notes  [facility] [address] [place] [place], [place] [address] [phone] --Must be a resident of [facility] -- Must have NO insurance coverage whatsoever (no Medicaid/ Medicare, etc.) -- The pt. MUST have a primary care doctor that directs their care regularly and follows them in the community   Med[facility]ist  [phone]   [employer]  [phone]    Agencies that provide inexpensive medical care: Organization         Address  Phone   Notes  [facility]  [phone]   [facility]    [phone]   Women's  [facility] [place], [place] [address] [phone]   [place] [address] [place] [phone]   Planned Parenthood    [phone]   [facility]    [phone]   [place] and [place] [address], [place] Phone:  [phone], Fax:  [phone] Hours of Operation:  9 am - 6 pm, M-F.  Also accepts Medicaid/Medicare and self-pay.  [facility] for [place], [address], [place] Phone: [phone], Fax: [phone]. Hours of Operation:  8:30 am - 5:30 pm, M-F.  Also accepts Medicaid and self-pay.  [facility] [address] [place] Phone: [phone]   [place], [place], [place] [phone], Ext. [phone] Mondays & Thursdays: 7-9 AM.  First 15 patients are seen on a first come, first serve basis.    [place] Providers:  Organization         Address  Phone   Notes  [facility] [address], [place] [phone] Also accepts self-pay patients.  [facility] [address], [place]  [phone]   [place], [address], [place] [phone]   [facility] [address], [place] [phone]   Lucianne Lei [address], [place]   [phone] Only accepts [place] Access [place] patients after they have their name applied to their card.   Self-Pay (no insurance) in [facility]:  Organization         Address  Phone   Notes  Sickle Cell Patients, [facility] [address] [facility] [place] [phone]   [name] [place] [phone]   [facility] [place]  [place], [place], [place] [phone]   [facility]/Dr. Osei-Bonsu  [address], [place] or [address], [place] [phone] Phone number for both [place] and [place] locations is the same.  Urgent Medical and [facility] [address] [place] [phone]   [facility] [address], [place] or [address] [phone]   [facility] [address] [place] [phone], phone; [phone], fax Sees patients 1st and 3rd Saturday of every month.  Must not qualify for public or private insurance (i.e. Medicaid, Medicare, [place] Health Choice, Veterans' Benefits)  Household income should be no more than 200% of the poverty level The clinic cannot treat you if you are pregnant or think you are pregnant  Sexually transmitted diseases are not treated at the clinic.    Dental Care: Organization         Address  Phone  Notes  [facility] of [facility] [place] [phone] Accepts children up to age 62 who are enrolled  in [place] or [place]; pregnant women with a Medicaid card; and children who have applied for Medicaid or [place] Health Choice, but were declined, whose parents can pay a reduced fee at time of service.  [facility] of [facility]  [address], [place] [phone] Accepts children up to age 54 who are  enrolled in Medicaid or [place] Health Choice; pregnant women with a Medicaid card; and children who have applied for Medicaid or [place] Health Choice, but were declined, whose parents can pay a reduced fee at time of service.  [facility] Adult Dental Access PROGRAM  [place] [phone] Patients are seen by appointment only. Walk-ins are not accepted. [place] will see patients 75 years of age and older. Monday - Tuesday (8am-5pm) Most Wednesdays (8:30-5pm) $30 per visit, cash only  [facility] Adult Dental Access PROGRAM  [address], [facility] [phone] Patients are seen by appointment only. Walk-ins are not accepted. [place] will see patients 53 years of age and older. One Wednesday Evening (Monthly: Volunteer Based).  $30 per visit, cash only  [place]  [phone] for adults; Children under age 42, call Graduate Pediatric Dentistry at [phone]. Children aged 41-14, please call [phone] to request a pediatric application.  Dental services are provided in all areas of dental care including fillings, crowns and bridges, complete and partial dentures, implants, gum treatment, root canals, and extractions. Preventive care is also provided. Treatment is provided to both adults and children. Patients are selected via a lottery and there is often a waiting list.   [facility] [address] [place]  [phone] [email or web address]   [facility] [address] [place], [place] [phone], Ext. [phone] Second and Fourth Thursday of each month, opens at  6:30 AM; Clinic ends at 9 AM.  Patients are seen on a first-come first-served basis, and a limited number are seen during each clinic.   [facility]  [address] [place] D[place]er [place], [place] [phone]   Eligibility Requirements You must have lived in [place], [place], or [place] counties for at least the last three months.   You cannot be eligible for state or federal sponsored [employer], including [employer], [place], or [employer].   You generally cannot be eligible for healthcare insurance through your employer.    How to apply: Eligibility screenings are held every Tuesday and Wednesday afternoon from 1:00 pm until 4:00 pm. You do not need an appointment for the interview!  [facility] [address] [place], [place]   [place]  [place] Department  [place]  [phone]    Behavioral Health Resources in the Community: Intensive Outpatient Programs Organization         Address  Phone  Notes  [place]. [address] [place], [place] [phone]   [facility] Outpatient [address] [place], [place]   ADS: Alcohol & Drug S[facility]cs [address] [place], [place]   [place] [address],  [place], [place] or [phone]   Substance Abuse Resources Organization         Address  Phone  Notes  Alcohol and Drug Services  [phone]   [place]  [phone]   [facility]   Chinita Pester  [phone]   Residential & Outpatient Substance Abuse Program  [phone]   Psychological Services Organization         Address  Phone  Notes  [facility]  [place]  [phone]   [place] [address] [place]  [phone] or [phone]    [place] Crisis Teams Organization         Address  Phone  Notes  [facility], Mobile Crisis Care Unit  [phone]   [facility]  [address] [place], [place]   [facility] [address], [place] [phone]    Self-Help/Support Groups Organization         Address  Phone             Notes  [place]. of [place] - variety of support groups  [place] Call for more information  Narcotics Anonymous (NA), [facility] [address], [employer] [place]  2 meetings at this location   Special educational needs teacher         Address  Phone  Notes  [facility]idential [facility] [place],    [place]  [phone]   [facility]  [address], [place] [phone], [place], [place]   [facility] [place], [place] [phone] Admissions: 8am-3pm M-F  Incentives Substance [place] [address]    [place], [place] [phone]   [facility] [address] [place], [place], [place]   [facility] [address]  [place], [place]   [facility] - Intensive Outpatient [address] Kristeen [address], [place], [place]   [facility] ([place] [place].,  [place], [place] [phone] or [phone]   [facility] Services (RTS) [address], [place], [place] Accepts Medicaid  [facility] [address]  [place] [phone] Substance Abuse/Addiction Treatment   [facility] Organization         Address  Phone  Notes  [facility]  [phone]   Domenic Schwab, PhD [address] Arlis Porta [place], [place]   [phone] or [phone]   [place], [place] [phone]   [facility]  [address] [place], [place] [phone] Insurance/Medicaid/sponsorship through [facility] and Families [address], [address] [place]                                    [place], [place] [phone] [place] [address], [place] [phone]    Dr. Adele Schilder  [phone]   [facility] Dept. 1) [address] [place] 2) [place] 3)  [place] [address], [place] [phone]  [phone]   [place] [phone] or [phone] (After Hours)

## 2014-02-14 NOTE — ED Notes (Signed)
Per pt sts lower back pain radiating down left leg. sts has been going on for the past few months and getting worse.

## 2014-02-19 ENCOUNTER — Encounter (HOSPITAL_COMMUNITY): Payer: Self-pay | Admitting: Emergency Medicine

## 2014-02-19 ENCOUNTER — Emergency Department (HOSPITAL_COMMUNITY)
Admission: EM | Admit: 2014-02-19 | Discharge: 2014-02-19 | Disposition: A | Discharge status: Home / Self Care | Payer: Medicare Other | Attending: Emergency Medicine | Admitting: Emergency Medicine

## 2014-02-19 DIAGNOSIS — M545 Low back pain, unspecified: Secondary | ICD-10-CM | POA: Insufficient documentation

## 2014-02-19 DIAGNOSIS — M549 Dorsalgia, unspecified: Secondary | ICD-10-CM

## 2014-02-19 DIAGNOSIS — F17200 Nicotine dependence, unspecified, uncomplicated: Secondary | ICD-10-CM | POA: Insufficient documentation

## 2014-02-19 DIAGNOSIS — Z79899 Other long term (current) drug therapy: Secondary | ICD-10-CM | POA: Insufficient documentation

## 2014-02-19 DIAGNOSIS — Z9089 Acquired absence of other organs: Secondary | ICD-10-CM | POA: Insufficient documentation

## 2014-02-19 DIAGNOSIS — M19019 Primary osteoarthritis, unspecified shoulder: Secondary | ICD-10-CM | POA: Insufficient documentation

## 2014-02-19 DIAGNOSIS — I2510 Atherosclerotic heart disease of native coronary artery without angina pectoris: Secondary | ICD-10-CM | POA: Insufficient documentation

## 2014-02-19 DIAGNOSIS — Z21 Asymptomatic human immunodeficiency virus [HIV] infection status: Secondary | ICD-10-CM | POA: Insufficient documentation

## 2014-02-19 DIAGNOSIS — G8929 Other chronic pain: Secondary | ICD-10-CM | POA: Insufficient documentation

## 2014-02-19 DIAGNOSIS — Z8701 Personal history of pneumonia (recurrent): Secondary | ICD-10-CM | POA: Insufficient documentation

## 2014-02-19 DIAGNOSIS — I10 Essential (primary) hypertension: Secondary | ICD-10-CM | POA: Insufficient documentation

## 2014-02-19 DIAGNOSIS — Z8661 Personal history of infections of the central nervous system: Secondary | ICD-10-CM | POA: Insufficient documentation

## 2014-02-19 MED ORDER — PROMETHAZINE HCL 25 MG PO TABS: 25.0000 mg | ORAL_TABLET | Freq: Four times a day (QID) | ORAL | Status: DC | PRN

## 2014-02-19 MED ORDER — ONDANSETRON 8 MG PO TBDP
8.0000 mg | ORAL_TABLET | Freq: Once | ORAL | Status: AC
  Administered 2014-02-19: 8 mg via ORAL
  Filled 2014-02-19: qty 2

## 2014-02-19 MED ORDER — OXYCODONE-ACETAMINOPHEN 5-325 MG PO TABS
2.0000 | ORAL_TABLET | Freq: Once | ORAL | Status: AC
  Administered 2014-02-19: 2 via ORAL
  Filled 2014-02-19: qty 2

## 2014-02-19 MED ORDER — OXYCODONE-ACETAMINOPHEN 5-325 MG PO TABS: 1.0000 | ORAL_TABLET | Freq: Four times a day (QID) | ORAL | Status: DC | PRN

## 2014-02-19 NOTE — ED Notes (Signed)
Pt with chronic back pain from back surgeries.  Has fragments in back.  Pt is to see pain clinic in July.  At home meds are not working.  Pain is going down legs and has before.

## 2014-02-19 NOTE — ED Provider Notes (Signed)
CSN: [id number]     Arrival date & time 02/19/14  1101 History  This chart was scribed for Cleatrice Burke, PA, working with Osvaldo Shipper, MD, by Delphia Grates, ED Scribe. This patient was seen in room WTR5/WTR5 and the patient's care was started at 12:39 PM.    Chief Complaint  Patient presents with  . Back Pain    The history is provided by the patient. No language interpreter was used.    HPI Comments: Kenneth [name] is a 58 y.o. male who presents to the Emergency Department complaining of chronic back pain that began in 2006 as a result of back surgery (preformed in [place], [place]). Patient describes the pain as sharp and states it radiates down his left leg. Patient is trying to become established with pain management next month. He states the pain is worse in the morning when he tries to get up, but then somewhat relieves itself after he ambulates. He has taken tylenol with no relief. He denies fever, chills, nausea, emesis, bladder or bowel problems. Patient states he is supposed to see his PCP (Dr. Owens Shark) some time this month. Patient is HIV positive, and has history of HTN and CAD. Patient takes his antivirals as prescribed and states that his viral load in undetectable. Patient denies any drug use.  Past Medical History  Diagnosis Date  . HIV positive 2004  . Hypertension   . Meningitis   . Coronary artery disease   . Chest pain at rest 09/23/2012  . Chronic bronchitis     "q year last 5 yr or so" (09/24/2012)  . Exertional dyspnea   . Migraines   . Arthritis     "both shoulders" (09/24/2012)  . Chronic lower back pain   . Pneumonia   . Lumbar disc disease   . Iliac artery aneurysm     Right   Past Surgical History  Procedure Laterality Date  . Intussusception repair  10/2011  . Posterior lumbar fusion  1995  . Elbow surgery  ~ 1997    "removed some stones; right" (09/24/2012)  . Appendectomy  2013  . Back surgery  2006    4 BACK SURGERIES  . Abdominal surgery    .  Spine surgery      Injury to back 1995   Family History  Problem Relation Age of Onset  . Heart disease Father   . Heart disease Brother   . Diabetes Brother    History  Substance Use Topics  . Smoking status: Current Every Day Smoker -- 0.50 packs/day for 45 years    Types: Cigarettes    Last Attempt to Quit: 09/24/2012  . Smokeless tobacco: Never Used     Comment: 09/24/2012 "been stopped now ~ 8 month".  3 per day now.  . Alcohol Use: No    Review of Systems  Constitutional: Negative for fever and chills.  Gastrointestinal: Negative for nausea and vomiting.  Musculoskeletal: Positive for back pain.  All other systems reviewed and are negative.     Allergies  Bee venom; Flexeril; Hydrocodone; Nabumetone; [name]; Tramadol; Ace inhibitors; and Other  Home Medications   Prior to Admission medications   Medication Sig Start Date End Date Taking? Authorizing Provider  amLODipine (NORVASC) 10 MG tablet Take 10 mg by mouth every morning.   Yes Historical Provider, MD  elvitegravir-cobicistat-emtricitabine-tenofovir (STRIBILD) 150-150-200-300 MG TABS tablet Take 1 tablet by mouth daily with breakfast.   Yes Historical Provider, MD  EPINEPHrine (EPI-PEN) 0.3 mg/0.3 mL  SOAJ Inject 0.3 mg into the muscle once as needed (Anaphylaxis).  03/09/13  Yes Montine Circle, PA-C  nitroGLYCERIN (NITROSTAT) 0.4 MG SL tablet Place 0.4 mg under the tongue every 5 (five) minutes as needed for chest pain.  08/11/13  Yes Tonia Brooms, MD   Triage Vitals: BP 130/93  Pulse 98  Temp(Src) 98.3 F (36.8 C) (Oral)  Resp 20  SpO2 98%  Physical Exam  Nursing note and vitals reviewed. Constitutional: He is oriented to person, place, and time. He appears well-developed and well-nourished. No distress.  HENT:  Head: Normocephalic and atraumatic.  Right Ear: External ear normal.  Left Ear: External ear normal.  Nose: Nose normal.  Eyes: Conjunctivae are normal.  Neck: Normal range of motion. No  tracheal deviation present.  Cardiovascular: Normal rate, regular rhythm, normal heart sounds, intact distal pulses and normal pulses.   Pulses:      Posterior tibial pulses are 2+ on the right side, and 2+ on the left side.  Pulmonary/Chest: Effort normal and breath sounds normal. No stridor.  Abdominal: Soft. He exhibits no distension. There is no tenderness.  Musculoskeletal: Normal range of motion.  TTP in lumbar spine. No deformities or step offs. Strength 5/5 in lower extremities bilaterally. Patient ambulates with antalgic gait.  Neurological: He is alert and oriented to person, place, and time.  Skin: Skin is warm and dry. He is not diaphoretic.  Psychiatric: He has a normal mood and affect. His behavior is normal.    ED Course  Procedures (including critical care time)  DIAGNOSTIC STUDIES: Oxygen Saturation is 98% on room air, normal by my interpretation.    COORDINATION OF CARE: At 9292 Discussed treatment plan with patient which includes Percocet. Patient agrees.   Labs Review Labs Reviewed - No data to display  Imaging Review No results found.   EKG Interpretation None      MDM   Final diagnoses:  Chronic back pain    Patient with back pain.  No neurological deficits and normal neuro exam.  Patient can walk but states is painful.  No loss of bowel or bladder control.  No concern for cauda equina.  No fever, night sweats, weight loss, h/o cancer, IVDU.  RICE protocol and pain medicine indicated and discussed with patient.    I personally performed the services described in this documentation, which was scribed in my presence. The recorded information has been reviewed and is accurate.    Elwyn Lade, PA-C 02/19/14 1312

## 2014-02-19 NOTE — Discharge Instructions (Signed)
Back Pain, Adult Low back pain is very common. About 1 in 5 people have back pain.The cause of low back pain is rarely dangerous. The pain often gets better over time.About half of people with a sudden onset of back pain feel better in just 2 weeks. About 8 in 10 people feel better by 6 weeks.  CAUSES Some common causes of back pain include:  Strain of the muscles or ligaments supporting the spine.  Wear and tear (degeneration) of the spinal discs.  Arthritis.  Direct injury to the back. DIAGNOSIS Most of the time, the direct cause of low back pain is not known.However, back pain can be treated effectively even when the exact cause of the pain is unknown.Answering your caregiver's questions about your overall health and symptoms is one of the most accurate ways to make sure the cause of your pain is not dangerous. If your caregiver needs more information, he or she may order lab work or imaging tests (X-rays or MRIs).However, even if imaging tests show changes in your back, this usually does not require surgery. HOME CARE INSTRUCTIONS For many people, back pain returns.Since low back pain is rarely dangerous, it is often a condition that people can learn to manageon their own.   Remain active. It is stressful on the back to sit or stand in one place. Do not sit, drive, or stand in one place for more than 30 minutes at a time. Take short walks on level surfaces as soon as pain allows.Try to increase the length of time you walk each day.  Do not stay in bed.Resting more than 1 or 2 days can delay your recovery.  Do not avoid exercise or work.Your body is made to move.It is not dangerous to be active, even though your back may hurt.Your back will likely heal faster if you return to being active before your pain is gone.  Pay attention to your body when you bend and lift. Many people have less discomfortwhen lifting if they bend their knees, keep the load close to their bodies,and  avoid twisting. Often, the most comfortable positions are those that put less stress on your recovering back.  Find a comfortable position to sleep. Use a firm mattress and lie on your side with your knees slightly bent. If you lie on your back, put a pillow under your knees.  Only take over-the-counter or prescription medicines as directed by your caregiver. Over-the-counter medicines to reduce pain and inflammation are often the most helpful.Your caregiver may prescribe muscle relaxant drugs.These medicines help dull your pain so you can more quickly return to your normal activities and healthy exercise.  Put ice on the injured area.  Put ice in a plastic bag.  Place a towel between your skin and the bag.  Leave the ice on for 15-20 minutes, 03-04 times a day for the first 2 to 3 days. After that, ice and heat may be alternated to reduce pain and spasms.  Ask your caregiver about trying back exercises and gentle massage. This may be of some benefit.  Avoid feeling anxious or stressed.Stress increases muscle tension and can worsen back pain.It is important to recognize when you are anxious or stressed and learn ways to manage it.Exercise is a great option. SEEK MEDICAL CARE IF:  You have pain that is not relieved with rest or medicine.  You have pain that does not improve in 1 week.  You have new symptoms.  You are generally not feeling well. SEEK   IMMEDIATE MEDICAL CARE IF:   You have pain that radiates from your back into your legs.  You develop new bowel or bladder control problems.  You have unusual weakness or numbness in your arms or legs.  You develop nausea or vomiting.  You develop abdominal pain.  You feel faint. Document Released: 09/04/2005 Document Revised: 03/05/2012 Document Reviewed: 01/23/2011 [employer] Patient Information 2014 [employer].  

## 2014-02-19 NOTE — ED Provider Notes (Signed)
Medical screening examination/treatment/procedure(s) were performed by non-physician practitioner and as supervising physician I was immediately available for consultation/collaboration.   EKG Interpretation None        Osvaldo Shipper, MD 02/19/14 [phone]

## 2014-02-19 NOTE — ED Provider Notes (Signed)
Medical screening examination/treatment/procedure(s) were performed by non-physician practitioner and as supervising physician I was immediately available for consultation/collaboration.   EKG Interpretation None        [place], DO 02/19/14 [phone]

## 2014-03-01 ENCOUNTER — Emergency Department (HOSPITAL_COMMUNITY)
Admission: EM | Admit: 2014-03-01 | Discharge: 2014-03-01 | Disposition: A | Discharge status: Home / Self Care | Payer: Medicare Other | Attending: Emergency Medicine | Admitting: Emergency Medicine

## 2014-03-01 ENCOUNTER — Encounter (HOSPITAL_COMMUNITY): Payer: Self-pay | Admitting: Emergency Medicine

## 2014-03-01 DIAGNOSIS — G8929 Other chronic pain: Secondary | ICD-10-CM

## 2014-03-01 DIAGNOSIS — I10 Essential (primary) hypertension: Secondary | ICD-10-CM | POA: Insufficient documentation

## 2014-03-01 DIAGNOSIS — Z8701 Personal history of pneumonia (recurrent): Secondary | ICD-10-CM | POA: Insufficient documentation

## 2014-03-01 DIAGNOSIS — M549 Dorsalgia, unspecified: Secondary | ICD-10-CM

## 2014-03-01 DIAGNOSIS — M545 Low back pain, unspecified: Secondary | ICD-10-CM | POA: Insufficient documentation

## 2014-03-01 DIAGNOSIS — M542 Cervicalgia: Secondary | ICD-10-CM | POA: Insufficient documentation

## 2014-03-01 DIAGNOSIS — M19019 Primary osteoarthritis, unspecified shoulder: Secondary | ICD-10-CM | POA: Insufficient documentation

## 2014-03-01 DIAGNOSIS — Z21 Asymptomatic human immunodeficiency virus [HIV] infection status: Secondary | ICD-10-CM | POA: Insufficient documentation

## 2014-03-01 DIAGNOSIS — I2510 Atherosclerotic heart disease of native coronary artery without angina pectoris: Secondary | ICD-10-CM | POA: Insufficient documentation

## 2014-03-01 DIAGNOSIS — Z79899 Other long term (current) drug therapy: Secondary | ICD-10-CM | POA: Insufficient documentation

## 2014-03-01 DIAGNOSIS — G43909 Migraine, unspecified, not intractable, without status migrainosus: Secondary | ICD-10-CM | POA: Insufficient documentation

## 2014-03-01 DIAGNOSIS — Z8709 Personal history of other diseases of the respiratory system: Secondary | ICD-10-CM | POA: Insufficient documentation

## 2014-03-01 DIAGNOSIS — Z9889 Other specified postprocedural states: Secondary | ICD-10-CM | POA: Insufficient documentation

## 2014-03-01 DIAGNOSIS — F17200 Nicotine dependence, unspecified, uncomplicated: Secondary | ICD-10-CM | POA: Insufficient documentation

## 2014-03-01 MED ORDER — OXYCODONE-ACETAMINOPHEN 5-325 MG PO TABS
2.0000 | ORAL_TABLET | Freq: Once | ORAL | Status: AC
  Administered 2014-03-01: 2 via ORAL
  Filled 2014-03-01: qty 2

## 2014-03-01 NOTE — ED Notes (Signed)
Pt reports chronic back pain.  Pain started around 0330. Pain 10/10. Ambulatory with  pain

## 2014-03-01 NOTE — Discharge Instructions (Signed)
Back Pain, Adult Low back pain is very common. About 1 in 5 people have back pain.The cause of low back pain is rarely dangerous. The pain often gets better over time.About half of people with a sudden onset of back pain feel better in just 2 weeks. About 8 in 10 people feel better by 6 weeks.  CAUSES Some common causes of back pain include:  Strain of the muscles or ligaments supporting the spine.  Wear and tear (degeneration) of the spinal discs.  Arthritis.  Direct injury to the back. DIAGNOSIS Most of the time, the direct cause of low back pain is not known.However, back pain can be treated effectively even when the exact cause of the pain is unknown.Answering your caregiver's questions about your overall health and symptoms is one of the most accurate ways to make sure the cause of your pain is not dangerous. If your caregiver needs more information, he or she may order lab work or imaging tests (X-rays or MRIs).However, even if imaging tests show changes in your back, this usually does not require surgery. HOME CARE INSTRUCTIONS For many people, back pain returns.Since low back pain is rarely dangerous, it is often a condition that people can learn to manageon their own.   Remain active. It is stressful on the back to sit or stand in one place. Do not sit, drive, or stand in one place for more than 30 minutes at a time. Take short walks on level surfaces as soon as pain allows.Try to increase the length of time you walk each day.  Do not stay in bed.Resting more than 1 or 2 days can delay your recovery.  Do not avoid exercise or work.Your body is made to move.It is not dangerous to be active, even though your back may hurt.Your back will likely heal faster if you return to being active before your pain is gone.  Pay attention to your body when you bend and lift. Many people have less discomfortwhen lifting if they bend their knees, keep the load close to their bodies,and  avoid twisting. Often, the most comfortable positions are those that put less stress on your recovering back.  Find a comfortable position to sleep. Use a firm mattress and lie on your side with your knees slightly bent. If you lie on your back, put a pillow under your knees.  Only take over-the-counter or prescription medicines as directed by your caregiver. Over-the-counter medicines to reduce pain and inflammation are often the most helpful.Your caregiver may prescribe muscle relaxant drugs.These medicines help dull your pain so you can more quickly return to your normal activities and healthy exercise.  Put ice on the injured area.  Put ice in a plastic bag.  Place a towel between your skin and the bag.  Leave the ice on for 15-20 minutes, 03-04 times a day for the first 2 to 3 days. After that, ice and heat may be alternated to reduce pain and spasms.  Ask your caregiver about trying back exercises and gentle massage. This may be of some benefit.  Avoid feeling anxious or stressed.Stress increases muscle tension and can worsen back pain.It is important to recognize when you are anxious or stressed and learn ways to manage it.Exercise is a great option. SEEK MEDICAL CARE IF:  You have pain that is not relieved with rest or medicine.  You have pain that does not improve in 1 week.  You have new symptoms.  You are generally not feeling well. SEEK   IMMEDIATE MEDICAL CARE IF:   You have pain that radiates from your back into your legs.  You develop new bowel or bladder control problems.  You have unusual weakness or numbness in your arms or legs.  You develop nausea or vomiting.  You develop abdominal pain.  You feel faint. Document Released: 09/04/2005 Document Revised: 03/05/2012 Document Reviewed: 01/23/2011 [employer] Patient Information 2014 [employer].  

## 2014-03-01 NOTE — ED Provider Notes (Signed)
CSN: [id number]     Arrival date & time 03/01/14  1251 History  This chart was scribed for non-physician practitioner, Glendell Docker, NP working with Hoy Morn, MD by Frederich Balding, ED scribe. This patient was seen in room WTR6/WTR6 and the patient's care was started at 1:22 PM.   Chief Complaint  Patient presents with  . Back Pain   The history is provided by the patient. No language interpreter was used.   HPI Comments: Kenneth [name] is a 58 y.o. male with history of HIV, lumbar disc disease, arthritis and hypertension who presents to the Emergency Department complaining of chronic lower back pain that radiates into his left leg. States this is his normal pain. Denies new falls or injury. Pt states his Medicaid card came in last week so he is going to try to get in with his new PCP next week to get all new doctors. Denies fever.   Past Medical History  Diagnosis Date  . HIV positive 2004  . Hypertension   . Meningitis   . Coronary artery disease   . Chest pain at rest 09/23/2012  . Chronic bronchitis     "q year last 5 yr or so" (09/24/2012)  . Exertional dyspnea   . Migraines   . Arthritis     "both shoulders" (09/24/2012)  . Chronic lower back pain   . Pneumonia   . Lumbar disc disease   . Iliac artery aneurysm     Right   Past Surgical History  Procedure Laterality Date  . Intussusception repair  10/2011  . Posterior lumbar fusion  1995  . Elbow surgery  ~ 1997    "removed some stones; right" (09/24/2012)  . Appendectomy  2013  . Back surgery  2006    4 BACK SURGERIES  . Abdominal surgery    . Spine surgery      Injury to back 1995   Family History  Problem Relation Age of Onset  . Heart disease Father   . Heart disease Brother   . Diabetes Brother    History  Substance Use Topics  . Smoking status: Current Every Day Smoker -- 0.50 packs/day for 45 years    Types: Cigarettes    Last Attempt to Quit: 09/24/2012  . Smokeless tobacco: Never Used     Comment:  09/24/2012 "been stopped now ~ 8 month".  3 per day now.  . Alcohol Use: No    Review of Systems  Constitutional: Negative for fever.  Musculoskeletal: Positive for back pain and neck pain.  All other systems reviewed and are negative.  Allergies  Bee venom; Flexeril; Hydrocodone; Nabumetone; [name]; Tramadol; Ace inhibitors; and Other  Home Medications   Prior to Admission medications   Medication Sig Start Date End Date Taking? Authorizing Provider  amLODipine (NORVASC) 10 MG tablet Take 10 mg by mouth every morning.    Historical Provider, MD  elvitegravir-cobicistat-emtricitabine-tenofovir (STRIBILD) 150-150-200-300 MG TABS tablet Take 1 tablet by mouth daily with breakfast.    Historical Provider, MD  EPINEPHrine (EPI-PEN) 0.3 mg/0.3 mL SOAJ Inject 0.3 mg into the muscle once as needed (Anaphylaxis).  03/09/13   Montine Circle, PA-C  nitroGLYCERIN (NITROSTAT) 0.4 MG SL tablet Place 0.4 mg under the tongue every 5 (five) minutes as needed for chest pain.  08/11/13   Tonia Brooms, MD  oxyCODONE-acetaminophen (PERCOCET/ROXICET) 5-325 MG per tablet Take 1-2 tablets by mouth every 6 (six) hours as needed for moderate pain or severe pain. 02/19/14  Elwyn Lade, PA-C  promethazine (PHENERGAN) 25 MG tablet Take 1 tablet (25 mg total) by mouth every 6 (six) hours as needed for nausea or vomiting. 02/19/14   Kara Mead Merrell, PA-C   BP 124/97  Pulse 96  Temp(Src) 98.1 F (36.7 C) (Oral)  Resp 16  SpO2 99%  Physical Exam  Nursing note and vitals reviewed. Constitutional: He is oriented to person, place, and time. He appears well-developed and well-nourished. No distress.  HENT:  Head: Normocephalic and atraumatic.  Eyes: Conjunctivae and EOM are normal.  Neck: Normal range of motion. No tracheal deviation present.  Cardiovascular: Normal rate, regular rhythm and normal heart sounds.   Pulmonary/Chest: Effort normal and breath sounds normal. No respiratory distress. He has no  wheezes. He has no rales.  Musculoskeletal: Normal range of motion.  Generalized lumbar tenderness. Moving all extremities without difficulty.   Neurological: He is alert and oriented to person, place, and time.  Skin: Skin is warm and dry.  Psychiatric: He has a normal mood and affect. His behavior is normal.    ED Course  Procedures (including critical care time)  DIAGNOSTIC STUDIES: Oxygen Saturation is 99% on RA, normal by my interpretation.    COORDINATION OF CARE: 1:24 PM-Discussed treatment plan which includes reviewing his history with pt at bedside and pt agreed to plan.   Labs Review Labs Reviewed - No data to display  Imaging Review No results found.   EKG Interpretation None      MDM   Final diagnoses:  Chronic back pain    Pt has chronic pain. No fever. Pt states that he is being seen by a new pcp this week. No neuro deficits  I personally performed the services described in this documentation, which was scribed in my presence. The recorded information has been reviewed and is accurate.  Glendell Docker, NP 03/01/14 1345

## 2014-03-01 NOTE — ED Provider Notes (Signed)
Medical screening examination/treatment/procedure(s) were performed by non-physician practitioner and as supervising physician I was immediately available for consultation/collaboration.   EKG Interpretation None        Hoy Morn, MD 03/01/14 1511

## 2014-03-02 ENCOUNTER — Encounter (HOSPITAL_COMMUNITY): Payer: Self-pay | Admitting: Emergency Medicine

## 2014-03-02 ENCOUNTER — Emergency Department (HOSPITAL_COMMUNITY)
Admission: EM | Admit: 2014-03-02 | Discharge: 2014-03-02 | Disposition: A | Discharge status: Home / Self Care | Payer: Medicare Other | Attending: Emergency Medicine | Admitting: Emergency Medicine

## 2014-03-02 DIAGNOSIS — Z21 Asymptomatic human immunodeficiency virus [HIV] infection status: Secondary | ICD-10-CM | POA: Diagnosis not present

## 2014-03-02 DIAGNOSIS — Z8661 Personal history of infections of the central nervous system: Secondary | ICD-10-CM | POA: Insufficient documentation

## 2014-03-02 DIAGNOSIS — Z9889 Other specified postprocedural states: Secondary | ICD-10-CM | POA: Diagnosis not present

## 2014-03-02 DIAGNOSIS — G8929 Other chronic pain: Secondary | ICD-10-CM | POA: Diagnosis not present

## 2014-03-02 DIAGNOSIS — F17200 Nicotine dependence, unspecified, uncomplicated: Secondary | ICD-10-CM | POA: Diagnosis not present

## 2014-03-02 DIAGNOSIS — G43909 Migraine, unspecified, not intractable, without status migrainosus: Secondary | ICD-10-CM | POA: Insufficient documentation

## 2014-03-02 DIAGNOSIS — I2510 Atherosclerotic heart disease of native coronary artery without angina pectoris: Secondary | ICD-10-CM | POA: Insufficient documentation

## 2014-03-02 DIAGNOSIS — Z8701 Personal history of pneumonia (recurrent): Secondary | ICD-10-CM | POA: Insufficient documentation

## 2014-03-02 DIAGNOSIS — M549 Dorsalgia, unspecified: Secondary | ICD-10-CM | POA: Diagnosis present

## 2014-03-02 DIAGNOSIS — M12819 Other specific arthropathies, not elsewhere classified, unspecified shoulder: Secondary | ICD-10-CM | POA: Diagnosis not present

## 2014-03-02 DIAGNOSIS — Z79899 Other long term (current) drug therapy: Secondary | ICD-10-CM | POA: Insufficient documentation

## 2014-03-02 DIAGNOSIS — Z8709 Personal history of other diseases of the respiratory system: Secondary | ICD-10-CM | POA: Diagnosis not present

## 2014-03-02 DIAGNOSIS — I10 Essential (primary) hypertension: Secondary | ICD-10-CM | POA: Insufficient documentation

## 2014-03-02 MED ORDER — KETOROLAC TROMETHAMINE 60 MG/2ML IM SOLN
60.0000 mg | Freq: Once | INTRAMUSCULAR | Status: AC
  Administered 2014-03-02: 60 mg via INTRAMUSCULAR
  Filled 2014-03-02: qty 2

## 2014-03-02 NOTE — ED Provider Notes (Signed)
CSN: [id number]     Arrival date & time 03/02/14  4268 History  This chart was scribed for non-physician practitioner Glendell Docker, NP working with Carmin Muskrat, MD by Eston Mould, ED Scribe. This patient was seen in room TR08C/TR08C and the patient's care was started at 10:18 AM .    Chief Complaint  Patient presents with  . Back Pain   The history is provided by the patient. No language interpreter was used.   HPI Comments: Kenneth [name] is a 58 y.o. male with a hx of HIV+, 4 back surgeries, and chronic back pain who presents to the Emergency Department complaining of continuous back pain. Pt was seen at [facility] yesterday afternoon. Pt states he received his Medicaid card yesterday and is awaiting to hear from [facility] for an earlier apt; apt is scheduled for March 30, 2014 and was encouraged to come to the ED for further evaluation. He states his pain has not improved since yesterday. Denies fever, chill, and recent falls.  Past Medical History  Diagnosis Date  . HIV positive 2004  . Hypertension   . Meningitis   . Coronary artery disease   . Chest pain at rest 09/23/2012  . Chronic bronchitis     "q year last 5 yr or so" (09/24/2012)  . Exertional dyspnea   . Migraines   . Arthritis     "both shoulders" (09/24/2012)  . Chronic lower back pain   . Pneumonia   . Lumbar disc disease   . Iliac artery aneurysm     Right   Past Surgical History  Procedure Laterality Date  . Intussusception repair  10/2011  . Posterior lumbar fusion  1995  . Elbow surgery  ~ 1997    "removed some stones; right" (09/24/2012)  . Appendectomy  2013  . Back surgery  2006    4 BACK SURGERIES  . Abdominal surgery    . Spine surgery      Injury to back 1995   Family History  Problem Relation Age of Onset  . Heart disease Father   . Heart disease Brother   . Diabetes Brother    History  Substance Use Topics  . Smoking status: Current Every Day Smoker -- 0.50 packs/day for 45 years     Types: Cigarettes    Last Attempt to Quit: 09/24/2012  . Smokeless tobacco: Never Used     Comment: 09/24/2012 "been stopped now ~ 8 month".  3 per day now.  . Alcohol Use: No    Review of Systems  Constitutional: Negative for fever and chills.  Musculoskeletal: Positive for back pain and myalgias. Negative for gait problem.  All other systems reviewed and are negative.  Allergies  Bee venom; Flexeril; Hydrocodone; Nabumetone; [name]; Tramadol; Ace inhibitors; and Other  Home Medications   Prior to Admission medications   Medication Sig Start Date End Date Taking? Authorizing Provider  amLODipine (NORVASC) 10 MG tablet Take 10 mg by mouth every morning.    Historical Provider, MD  elvitegravir-cobicistat-emtricitabine-tenofovir (STRIBILD) 150-150-200-300 MG TABS tablet Take 1 tablet by mouth daily with breakfast.    Historical Provider, MD  EPINEPHrine (EPI-PEN) 0.3 mg/0.3 mL SOAJ Inject 0.3 mg into the muscle once as needed (Anaphylaxis).  03/09/13   Montine Circle, PA-C  nitroGLYCERIN (NITROSTAT) 0.4 MG SL tablet Place 0.4 mg under the tongue every 5 (five) minutes as needed for chest pain.  08/11/13   Tonia Brooms, MD  oxyCODONE-acetaminophen (PERCOCET/ROXICET) 5-325 MG per tablet Take 1-2 tablets  by mouth every 6 (six) hours as needed for moderate pain or severe pain. 02/19/14   Elwyn Lade, PA-C  promethazine (PHENERGAN) 25 MG tablet Take 1 tablet (25 mg total) by mouth every 6 (six) hours as needed for nausea or vomiting. 02/19/14   Kara Mead Merrell, PA-C   BP 139/106  Pulse 86  Temp(Src) 97.9 F (36.6 C) (Oral)  Resp 18  Wt 158 lb (71.668 kg)  SpO2 98%  Physical Exam  Nursing note and vitals reviewed. Constitutional: He is oriented to person, place, and time. He appears well-developed and well-nourished. No distress.  HENT:  Head: Normocephalic and atraumatic.  Eyes: EOM are normal.  Neck: Neck supple. No tracheal deviation present.  Cardiovascular: Normal rate.    Pulmonary/Chest: Effort normal. No respiratory distress.  Musculoskeletal: Normal range of motion.  Neurological: He is alert and oriented to person, place, and time.  Skin: Skin is warm and dry.  Psychiatric: He has a normal mood and affect. His behavior is normal.   ED Course  Procedures  DIAGNOSTIC STUDIES: Oxygen Saturation is 98% on RA, normal by my interpretation.    COORDINATION OF CARE: 10:20 AM-Discussed treatment plan which includes shot of Toradol while in the ED. Pt agreed to plan.   Labs Review Labs Reviewed - No data to display  Imaging Review No results found.   EKG Interpretation None     MDM   Final diagnoses:  Chronic back pain    Pain is chronic in nature. No fever. No change in chronic pain just hasn't been able to get his narcotics. Pt is scheduled to see tried July 13  I personally performed the services described in this documentation, which was scribed in my presence. The recorded information has been reviewed and is accurate.    Glendell Docker, NP 03/02/14 1028

## 2014-03-02 NOTE — ED Notes (Signed)
Pt c/o chronic back pain. Had injury in 2006 and reports he has had "four surgeries" from a doctor in [place], [place]. Today, c/o LBP with radiation to left hip and leg.

## 2014-03-02 NOTE — Discharge Instructions (Signed)
Back Pain, Adult Low back pain is very common. About 1 in 5 people have back pain.The cause of low back pain is rarely dangerous. The pain often gets better over time.About half of people with a sudden onset of back pain feel better in just 2 weeks. About 8 in 10 people feel better by 6 weeks.  CAUSES Some common causes of back pain include:  Strain of the muscles or ligaments supporting the spine.  Wear and tear (degeneration) of the spinal discs.  Arthritis.  Direct injury to the back. DIAGNOSIS Most of the time, the direct cause of low back pain is not known.However, back pain can be treated effectively even when the exact cause of the pain is unknown.Answering your caregiver's questions about your overall health and symptoms is one of the most accurate ways to make sure the cause of your pain is not dangerous. If your caregiver needs more information, he or she may order lab work or imaging tests (X-rays or MRIs).However, even if imaging tests show changes in your back, this usually does not require surgery. HOME CARE INSTRUCTIONS For many people, back pain returns.Since low back pain is rarely dangerous, it is often a condition that people can learn to manageon their own.   Remain active. It is stressful on the back to sit or stand in one place. Do not sit, drive, or stand in one place for more than 30 minutes at a time. Take short walks on level surfaces as soon as pain allows.Try to increase the length of time you walk each day.  Do not stay in bed.Resting more than 1 or 2 days can delay your recovery.  Do not avoid exercise or work.Your body is made to move.It is not dangerous to be active, even though your back may hurt.Your back will likely heal faster if you return to being active before your pain is gone.  Pay attention to your body when you bend and lift. Many people have less discomfortwhen lifting if they bend their knees, keep the load close to their bodies,and  avoid twisting. Often, the most comfortable positions are those that put less stress on your recovering back.  Find a comfortable position to sleep. Use a firm mattress and lie on your side with your knees slightly bent. If you lie on your back, put a pillow under your knees.  Only take over-the-counter or prescription medicines as directed by your caregiver. Over-the-counter medicines to reduce pain and inflammation are often the most helpful.Your caregiver may prescribe muscle relaxant drugs.These medicines help dull your pain so you can more quickly return to your normal activities and healthy exercise.  Put ice on the injured area.  Put ice in a plastic bag.  Place a towel between your skin and the bag.  Leave the ice on for 15-20 minutes, 03-04 times a day for the first 2 to 3 days. After that, ice and heat may be alternated to reduce pain and spasms.  Ask your caregiver about trying back exercises and gentle massage. This may be of some benefit.  Avoid feeling anxious or stressed.Stress increases muscle tension and can worsen back pain.It is important to recognize when you are anxious or stressed and learn ways to manage it.Exercise is a great option. SEEK MEDICAL CARE IF:  You have pain that is not relieved with rest or medicine.  You have pain that does not improve in 1 week.  You have new symptoms.  You are generally not feeling well. SEEK   IMMEDIATE MEDICAL CARE IF:   You have pain that radiates from your back into your legs.  You develop new bowel or bladder control problems.  You have unusual weakness or numbness in your arms or legs.  You develop nausea or vomiting.  You develop abdominal pain.  You feel faint. Document Released: 09/04/2005 Document Revised: 03/05/2012 Document Reviewed: 01/23/2011 [employer] Patient Information 2014 [employer].  

## 2014-03-02 NOTE — ED Notes (Signed)
Pt c/o low back pain ongoing for last few months. Pt tried to see his PMD but was told to come to ED. Pt with slow ambulation, denies recent injury. Pt denies urinary symptoms.

## 2014-03-02 NOTE — ED Provider Notes (Signed)
  Medical screening examination/treatment/procedure(s) were performed by non-physician practitioner and as supervising physician I was immediately available for consultation/collaboration.   EKG Interpretation None         Blessen Kimbrough, MD 03/02/14 1625 

## 2014-03-05 ENCOUNTER — Encounter (HOSPITAL_COMMUNITY): Payer: Self-pay | Admitting: Emergency Medicine

## 2014-03-05 ENCOUNTER — Emergency Department (HOSPITAL_COMMUNITY)
Admission: EM | Admit: 2014-03-05 | Discharge: 2014-03-05 | Disposition: A | Discharge status: Home / Self Care | Payer: Medicare Other | Attending: Emergency Medicine | Admitting: Emergency Medicine

## 2014-03-05 DIAGNOSIS — M25559 Pain in unspecified hip: Secondary | ICD-10-CM | POA: Diagnosis not present

## 2014-03-05 DIAGNOSIS — M129 Arthropathy, unspecified: Secondary | ICD-10-CM | POA: Diagnosis not present

## 2014-03-05 DIAGNOSIS — Z8709 Personal history of other diseases of the respiratory system: Secondary | ICD-10-CM | POA: Insufficient documentation

## 2014-03-05 DIAGNOSIS — M545 Low back pain, unspecified: Secondary | ICD-10-CM | POA: Insufficient documentation

## 2014-03-05 DIAGNOSIS — I10 Essential (primary) hypertension: Secondary | ICD-10-CM | POA: Insufficient documentation

## 2014-03-05 DIAGNOSIS — I2510 Atherosclerotic heart disease of native coronary artery without angina pectoris: Secondary | ICD-10-CM | POA: Insufficient documentation

## 2014-03-05 DIAGNOSIS — G8929 Other chronic pain: Secondary | ICD-10-CM | POA: Diagnosis not present

## 2014-03-05 DIAGNOSIS — G43909 Migraine, unspecified, not intractable, without status migrainosus: Secondary | ICD-10-CM | POA: Diagnosis not present

## 2014-03-05 DIAGNOSIS — Z9889 Other specified postprocedural states: Secondary | ICD-10-CM | POA: Insufficient documentation

## 2014-03-05 DIAGNOSIS — M79609 Pain in unspecified limb: Secondary | ICD-10-CM | POA: Insufficient documentation

## 2014-03-05 DIAGNOSIS — Z79899 Other long term (current) drug therapy: Secondary | ICD-10-CM | POA: Diagnosis not present

## 2014-03-05 DIAGNOSIS — Z21 Asymptomatic human immunodeficiency virus [HIV] infection status: Secondary | ICD-10-CM | POA: Diagnosis not present

## 2014-03-05 DIAGNOSIS — Z8701 Personal history of pneumonia (recurrent): Secondary | ICD-10-CM | POA: Insufficient documentation

## 2014-03-05 DIAGNOSIS — F17200 Nicotine dependence, unspecified, uncomplicated: Secondary | ICD-10-CM | POA: Diagnosis not present

## 2014-03-05 MED ORDER — OXYCODONE-ACETAMINOPHEN 5-325 MG PO TABS
2.0000 | ORAL_TABLET | Freq: Once | ORAL | Status: AC
  Administered 2014-03-05: 2 via ORAL
  Filled 2014-03-05: qty 2

## 2014-03-05 MED ORDER — DIAZEPAM 5 MG/ML IJ SOLN
5.0000 mg | Freq: Once | INTRAMUSCULAR | Status: AC
  Administered 2014-03-05: 5 mg via INTRAMUSCULAR
  Filled 2014-03-05: qty 2

## 2014-03-05 NOTE — ED Notes (Signed)
Pt verbalizes understanding of d/c instructions and denies any further needs at this time. 

## 2014-03-05 NOTE — ED Notes (Signed)
ED PA at bedside

## 2014-03-05 NOTE — ED Provider Notes (Signed)
CSN: [id number]     Arrival date & time 03/05/14  2245 History  This chart was scribed for non-physician practitioner, Montine Circle, PA-C, working with Richarda Blade, MD, by Delphia Grates, ED Scribe. This patient was seen in room TR05C/TR05C and the patient's care was started at 11:07 PM.    Chief Complaint  Patient presents with  . Back Pain  . Leg Pain     The history is provided by the patient. No language interpreter was used.    HPI Comments: Kenneth [name] is a 58 y.o. male with history of chronic back pain who presents to the Emergency Department with a chief complaint of left lower back pain that began PTA. Patient has history of 4 back surgeries (2006), and was attempting to change a tire earlier today, when his back started hurting. Patient describes the pain as stabbing. There is associated left hip and left leg pain. He denies bowel or bladder incontinence. Patient is HIV positive and has history of HTN and CAD. Patient denies IV drug use.   Past Medical History  Diagnosis Date  . HIV positive 2004  . Hypertension   . Meningitis   . Coronary artery disease   . Chest pain at rest 09/23/2012  . Chronic bronchitis     "q year last 5 yr or so" (09/24/2012)  . Exertional dyspnea   . Migraines   . Arthritis     "both shoulders" (09/24/2012)  . Chronic lower back pain   . Pneumonia   . Lumbar disc disease   . Iliac artery aneurysm     Right   Past Surgical History  Procedure Laterality Date  . Intussusception repair  10/2011  . Posterior lumbar fusion  1995  . Elbow surgery  ~ 1997    "removed some stones; right" (09/24/2012)  . Appendectomy  2013  . Back surgery  2006    4 BACK SURGERIES  . Abdominal surgery    . Spine surgery      Injury to back 1995   Family History  Problem Relation Age of Onset  . Heart disease Father   . Heart disease Brother   . Diabetes Brother    History  Substance Use Topics  . Smoking status: Current Every Day Smoker -- 0.50  packs/day for 45 years    Types: Cigarettes    Last Attempt to Quit: 09/24/2012  . Smokeless tobacco: Never Used     Comment: 09/24/2012 "been stopped now ~ 8 month".  3 per day now.  . Alcohol Use: No    Review of Systems  Constitutional: Negative for fever and chills.  Gastrointestinal:       No bowel incontinence  Genitourinary:       No urinary incontinence  Musculoskeletal: Positive for arthralgias, back pain and myalgias.  Neurological:       No saddle anesthesia      Allergies  Bee venom; Flexeril; Hydrocodone; Nabumetone; [name]; Tramadol; Ace inhibitors; and Other  Home Medications   Prior to Admission medications   Medication Sig Start Date End Date Taking? Authorizing Provider  amLODipine (NORVASC) 10 MG tablet Take 10 mg by mouth every morning.    Historical Provider, MD  elvitegravir-cobicistat-emtricitabine-tenofovir (STRIBILD) 150-150-200-300 MG TABS tablet Take 1 tablet by mouth daily with breakfast.    Historical Provider, MD  EPINEPHrine (EPI-PEN) 0.3 mg/0.3 mL SOAJ Inject 0.3 mg into the muscle once as needed (Anaphylaxis).  03/09/13   Montine Circle, PA-C  nitroGLYCERIN (NITROSTAT) 0.4  MG SL tablet Place 0.4 mg under the tongue every 5 (five) minutes as needed for chest pain.  08/11/13   Tonia Brooms, MD  oxyCODONE-acetaminophen (PERCOCET/ROXICET) 5-325 MG per tablet Take 1-2 tablets by mouth every 6 (six) hours as needed for moderate pain or severe pain. 02/19/14   Elwyn Lade, PA-C  promethazine (PHENERGAN) 25 MG tablet Take 1 tablet (25 mg total) by mouth every 6 (six) hours as needed for nausea or vomiting. 02/19/14   Elwyn Lade, PA-C   Triage Vitals: BP 131/86  Pulse 107  Temp(Src) 97.8 F (36.6 C) (Oral)  Resp 20  Wt 146 lb 11.2 oz (66.543 kg)  SpO2 100%  Physical Exam  Nursing note and vitals reviewed. Constitutional: He is oriented to person, place, and time. He appears well-developed and well-nourished. No distress.  HENT:  Head:  Normocephalic and atraumatic.  Eyes: Conjunctivae and EOM are normal. Right eye exhibits no discharge. Left eye exhibits no discharge. No scleral icterus.  Neck: Normal range of motion. Neck supple. No tracheal deviation present.  Cardiovascular: Normal rate, regular rhythm and normal heart sounds.  Exam reveals no gallop and no friction rub.   No murmur heard. Pulmonary/Chest: Effort normal and breath sounds normal. No respiratory distress. He has no wheezes.  Abdominal: Soft. He exhibits no distension. There is no tenderness.  Musculoskeletal: Normal range of motion.  Lumbar left paraspinal muscles tender to palpation, no bony tenderness, step-offs, or gross abnormality or deformity of spine, patient is able to ambulate, moves all extremities  Bilateral great toe extension intact Bilateral plantar/dorsiflexion intact  Neurological: He is alert and oriented to person, place, and time. He has normal reflexes.  Sensation and strength intact bilaterally Symmetrical reflexes  Skin: Skin is warm. He is not diaphoretic.  Psychiatric: He has a normal mood and affect. His behavior is normal. Judgment and thought content normal.    ED Course  Procedures (including critical care time)  DIAGNOSTIC STUDIES: Oxygen Saturation is 100% on room air, normal by my interpretation.    COORDINATION OF CARE: At 2311 Discussed treatment plan with patient which includes Valium and Percocet. Patient agrees.   Labs Review Labs Reviewed - No data to display  Imaging Review No results found.   EKG Interpretation None      MDM   Final diagnoses:  Left-sided low back pain without sciatica    Patient with back pain.  No neurological deficits and normal neuro exam.  Patient is ambulatory.  No loss of bowel or bladder control.  Doubt cauda equina.  Denies fever,  doubt epidural abscess or other lesion. Recommend back exercises, stretching, RICE.    I personally performed the services described in  this documentation, which was scribed in my presence. The recorded information has been reviewed and is accurate.     Montine Circle, PA-C 03/05/14 2344

## 2014-03-05 NOTE — ED Notes (Signed)
Pt states that he got a flat tire this afternoon and was attempting to change to tire and his back started hurting. Pt had to wait 3 hours until someone came to help him and then he came in to be seen. Pt has history of back fusion and surgery last fusion done in 2006. Pt also has pain to left hip and down left leg.

## 2014-03-05 NOTE — Discharge Instructions (Signed)

## 2014-03-09 ENCOUNTER — Encounter (HOSPITAL_COMMUNITY): Payer: Self-pay | Admitting: Emergency Medicine

## 2014-03-09 ENCOUNTER — Emergency Department (HOSPITAL_COMMUNITY)
Admission: EM | Admit: 2014-03-09 | Discharge: 2014-03-09 | Disposition: A | Discharge status: Home / Self Care | Payer: Medicare Other | Attending: Emergency Medicine | Admitting: Emergency Medicine

## 2014-03-09 DIAGNOSIS — Z21 Asymptomatic human immunodeficiency virus [HIV] infection status: Secondary | ICD-10-CM | POA: Insufficient documentation

## 2014-03-09 DIAGNOSIS — M549 Dorsalgia, unspecified: Secondary | ICD-10-CM

## 2014-03-09 DIAGNOSIS — Z8669 Personal history of other diseases of the nervous system and sense organs: Secondary | ICD-10-CM | POA: Diagnosis not present

## 2014-03-09 DIAGNOSIS — Z9889 Other specified postprocedural states: Secondary | ICD-10-CM | POA: Insufficient documentation

## 2014-03-09 DIAGNOSIS — F17200 Nicotine dependence, unspecified, uncomplicated: Secondary | ICD-10-CM | POA: Insufficient documentation

## 2014-03-09 DIAGNOSIS — M545 Low back pain, unspecified: Secondary | ICD-10-CM | POA: Insufficient documentation

## 2014-03-09 DIAGNOSIS — I10 Essential (primary) hypertension: Secondary | ICD-10-CM | POA: Diagnosis not present

## 2014-03-09 DIAGNOSIS — I2510 Atherosclerotic heart disease of native coronary artery without angina pectoris: Secondary | ICD-10-CM | POA: Diagnosis not present

## 2014-03-09 DIAGNOSIS — G8929 Other chronic pain: Secondary | ICD-10-CM | POA: Insufficient documentation

## 2014-03-09 DIAGNOSIS — Z8709 Personal history of other diseases of the respiratory system: Secondary | ICD-10-CM | POA: Insufficient documentation

## 2014-03-09 DIAGNOSIS — Z8701 Personal history of pneumonia (recurrent): Secondary | ICD-10-CM | POA: Diagnosis not present

## 2014-03-09 DIAGNOSIS — M19019 Primary osteoarthritis, unspecified shoulder: Secondary | ICD-10-CM | POA: Diagnosis not present

## 2014-03-09 MED ORDER — OXYCODONE-ACETAMINOPHEN 5-325 MG PO TABS: 1.0000 | ORAL_TABLET | Freq: Four times a day (QID) | ORAL | Status: DC | PRN

## 2014-03-09 MED ORDER — OXYCODONE-ACETAMINOPHEN 5-325 MG PO TABS
1.0000 | ORAL_TABLET | Freq: Once | ORAL | Status: AC
  Administered 2014-03-09: 1 via ORAL
  Filled 2014-03-09: qty 1

## 2014-03-09 NOTE — ED Provider Notes (Signed)
Medical screening examination/treatment/procedure(s) were performed by non-physician practitioner and as supervising physician I was immediately available for consultation/collaboration.   EKG Interpretation None        Richarda Blade, MD 03/09/14 [phone]

## 2014-03-09 NOTE — Discharge Instructions (Signed)
Nitroglycerin to keep your appointment with your new primary care physician on July 13

## 2014-03-09 NOTE — ED Notes (Signed)
Pt presents with c/o back pain. Pt says his back has been bothering him for approx 2.5 months. Pt says that he has had some difficulty getting in to see a PCP. Pt has a hx of back injury/pain. Ambulatory to triage.

## 2014-03-09 NOTE — ED Provider Notes (Signed)
CSN: [id number]     Arrival date & time 03/09/14  2156 History  This chart was scribed for non-physician practitioner, Barbaraann Rondo, NP-C working with April K. Randal Buba, MD by Einar Pheasant, ED scribe. This patient was seen in room [facility] and the patient's care was started at 11:18 PM.    Chief Complaint  Patient presents with  . Back Pain    The history is provided by the patient. No language interpreter was used.   HPI Comments: Kenneth [name] is a 58 y.o. male who presents to the Emergency Department complaining of lower back pain that has been ongoing for 2 months. Pt states that he has been trying to get in to see a PCP but has not been able to. Pt states that his scheduled visit is July 13. He states that he has had 4 back surgeries, his last one being in 2006. Pt states that he would really like to enroll in a pain clinic. He states that in the past he has taken oxycodone, with minimal relief. He denies any bladder or bowel incontinence.   Past Medical History  Diagnosis Date  . HIV positive 2004  . Hypertension   . Meningitis   . Coronary artery disease   . Chest pain at rest 09/23/2012  . Chronic bronchitis     "q year last 5 yr or so" (09/24/2012)  . Exertional dyspnea   . Migraines   . Arthritis     "both shoulders" (09/24/2012)  . Chronic lower back pain   . Pneumonia   . Lumbar disc disease   . Iliac artery aneurysm     Right   Past Surgical History  Procedure Laterality Date  . Intussusception repair  10/2011  . Posterior lumbar fusion  1995  . Elbow surgery  ~ 1997    "removed some stones; right" (09/24/2012)  . Appendectomy  2013  . Back surgery  2006    4 BACK SURGERIES  . Abdominal surgery    . Spine surgery      Injury to back 1995   Family History  Problem Relation Age of Onset  . Heart disease Father   . Heart disease Brother   . Diabetes Brother    History  Substance Use Topics  . Smoking status: Current Every Day Smoker -- 0.50 packs/day for 45 years     Types: Cigarettes    Last Attempt to Quit: 09/24/2012  . Smokeless tobacco: Never Used     Comment: 09/24/2012 "been stopped now ~ 8 month".  3 per day now.  . Alcohol Use: No    Review of Systems  Constitutional: Negative for fever.  Respiratory: Negative for shortness of breath.   Musculoskeletal: Positive for back pain.  Skin: Negative for rash and wound.  Neurological: Negative for weakness and numbness.  All other systems reviewed and are negative.     Allergies  Bee venom; Flexeril; Hydrocodone; Nabumetone; [name]; Tramadol; Ace inhibitors; and Other  Home Medications   Prior to Admission medications   Medication Sig Start Date End Date Taking? Authorizing Provider  EPINEPHrine (EPI-PEN) 0.3 mg/0.3 mL SOAJ Inject 0.3 mg into the muscle once as needed (Anaphylaxis).  03/09/13   Montine Circle, PA-C  nitroGLYCERIN (NITROSTAT) 0.4 MG SL tablet Place 0.4 mg under the tongue every 5 (five) minutes as needed for chest pain.  08/11/13   Tonia Brooms, MD  oxyCODONE-acetaminophen (PERCOCET/ROXICET) 5-325 MG per tablet Take 1-2 tablets by mouth every 6 (six) hours as needed  for moderate pain or severe pain. 02/19/14   Elwyn Lade, PA-C  oxyCODONE-acetaminophen (PERCOCET/ROXICET) 5-325 MG per tablet Take 1 tablet by mouth every 6 (six) hours as needed for severe pain. 03/09/14   Garald Balding, NP  promethazine (PHENERGAN) 25 MG tablet Take 1 tablet (25 mg total) by mouth every 6 (six) hours as needed for nausea or vomiting. 02/19/14   Elwyn Lade, PA-C   Triage Vitals: BP 131/91  Pulse 84  Temp(Src) 98 F (36.7 C) (Oral)  Resp 20  SpO2 98%  Physical Exam  Nursing note and vitals reviewed. Constitutional: He is oriented to person, place, and time. He appears well-developed and well-nourished.  HENT:  Head: Normocephalic.  Eyes: Pupils are equal, round, and reactive to light.  Neck: Normal range of motion.  Cardiovascular: Normal rate.   Pulmonary/Chest: Effort  normal.  Abdominal: Soft.  Musculoskeletal: Normal range of motion. He exhibits no edema and no tenderness.       Back:  Multiple surgeries   Neurological: He is alert and oriented to person, place, and time.  Skin: Skin is warm. No erythema.    ED Course  Procedures (including critical care time)  DIAGNOSTIC STUDIES: Oxygen Saturation is 98% on RA, normal by my interpretation.    COORDINATION OF CARE: 11:20 PM- Pt advised of plan for treatment and pt agrees.  Labs Review Labs Reviewed - No data to display  Imaging Review No results found.   EKG Interpretation None      MDM  Patient has appointment on July 13 th for first visit   Trying to get into pain clinic Final diagnoses:  Chronic back pain       I personally performed the services described in this documentation, which was scribed in my presence. The recorded information has been reviewed and is accurate.    Garald Balding, NP 03/09/14 2337

## 2014-03-10 NOTE — ED Provider Notes (Signed)
Medical screening examination/treatment/procedure(s) were performed by non-physician practitioner and as supervising physician I was immediately available for consultation/collaboration.   EKG Interpretation None       Gregrey Bloyd K Rudell Marlowe-Rasch, MD 03/10/14 [phone]

## 2014-03-12 ENCOUNTER — Emergency Department (HOSPITAL_COMMUNITY)
Admission: EM | Admit: 2014-03-12 | Discharge: 2014-03-12 | Disposition: A | Discharge status: Home / Self Care | Payer: Medicare Other | Attending: Emergency Medicine | Admitting: Emergency Medicine

## 2014-03-12 ENCOUNTER — Encounter (HOSPITAL_COMMUNITY): Payer: Self-pay | Admitting: Emergency Medicine

## 2014-03-12 DIAGNOSIS — Z8709 Personal history of other diseases of the respiratory system: Secondary | ICD-10-CM | POA: Diagnosis not present

## 2014-03-12 DIAGNOSIS — M545 Low back pain, unspecified: Secondary | ICD-10-CM | POA: Diagnosis present

## 2014-03-12 DIAGNOSIS — G8929 Other chronic pain: Secondary | ICD-10-CM | POA: Diagnosis not present

## 2014-03-12 DIAGNOSIS — Z8661 Personal history of infections of the central nervous system: Secondary | ICD-10-CM | POA: Diagnosis not present

## 2014-03-12 DIAGNOSIS — Z21 Asymptomatic human immunodeficiency virus [HIV] infection status: Secondary | ICD-10-CM | POA: Insufficient documentation

## 2014-03-12 DIAGNOSIS — M5380 Other specified dorsopathies, site unspecified: Secondary | ICD-10-CM | POA: Diagnosis not present

## 2014-03-12 DIAGNOSIS — F17200 Nicotine dependence, unspecified, uncomplicated: Secondary | ICD-10-CM | POA: Insufficient documentation

## 2014-03-12 DIAGNOSIS — Z8701 Personal history of pneumonia (recurrent): Secondary | ICD-10-CM | POA: Insufficient documentation

## 2014-03-12 DIAGNOSIS — I2510 Atherosclerotic heart disease of native coronary artery without angina pectoris: Secondary | ICD-10-CM | POA: Insufficient documentation

## 2014-03-12 DIAGNOSIS — I10 Essential (primary) hypertension: Secondary | ICD-10-CM | POA: Diagnosis not present

## 2014-03-12 DIAGNOSIS — M19019 Primary osteoarthritis, unspecified shoulder: Secondary | ICD-10-CM | POA: Diagnosis not present

## 2014-03-12 MED ORDER — MORPHINE SULFATE 4 MG/ML IJ SOLN
6.0000 mg | Freq: Once | INTRAMUSCULAR | Status: AC
  Administered 2014-03-12: 6 mg via INTRAMUSCULAR
  Filled 2014-03-12: qty 2

## 2014-03-12 MED ORDER — PREDNISONE 50 MG PO TABS: 50.0000 mg | ORAL_TABLET | Freq: Every day | ORAL | Status: DC

## 2014-03-12 MED ORDER — PERCOCET 5-325 MG PO TABS: 1.0000 | ORAL_TABLET | Freq: Four times a day (QID) | ORAL | Status: DC | PRN

## 2014-03-12 MED ORDER — DEXAMETHASONE SODIUM PHOSPHATE 10 MG/ML IJ SOLN
10.0000 mg | Freq: Once | INTRAMUSCULAR | Status: AC
  Administered 2014-03-12: 10 mg via INTRAMUSCULAR
  Filled 2014-03-12: qty 1

## 2014-03-12 MED ORDER — DIAZEPAM 5 MG PO TABS: 5.0000 mg | ORAL_TABLET | Freq: Three times a day (TID) | ORAL | Status: DC | PRN

## 2014-03-12 NOTE — ED Provider Notes (Signed)
Medical screening examination/treatment/procedure(s) were performed by non-physician practitioner and as supervising physician I was immediately available for consultation/collaboration.   EKG Interpretation None        Wandra Arthurs, MD 03/12/14 1546

## 2014-03-12 NOTE — Discharge Instructions (Signed)
Return here as needed.  Followup with your primary care Dr. he can also followup for an urgent care for recheck and further evaluation

## 2014-03-12 NOTE — ED Provider Notes (Signed)
CSN: [id number]     Arrival date & time 03/12/14  1307 History  This chart was scribed for non-physician practitioner, Dalia Heading, PA-C, working with Wandra Arthurs, MD, by Delphia Grates, ED Scribe. This patient was seen in room WTR6/WTR6 and the patient's care was started at 1:22 PM.    Chief Complaint  Patient presents with  . Back Pain    3 month hx of recurrent low back pain     The history is provided by the patient. No language interpreter was used.    HPI Comments: Kenneth [name] is a 58 y.o. male, with history of chronic back pain, who presents to the Emergency Department complaining of gradually worsening, lower back pain onset 3 months ago. Patient has history of 4 back surgeries (last surgery performed in 2006) and was seen here 3 days ago for similar pain. Patient states the pain radiates to his left leg. He states he has an appointment with his PCP (Dr. Owens Shark) on July 13th, however, he states he has been unable to get an earlier appointment. He denies fever, chills, nausea, emesis, bowel or bladder incontinence. Patient states he would like to be established with pain management. Patient is HIV positive and has history of HTN and CAD.    Past Medical History  Diagnosis Date  . HIV positive 2004  . Hypertension   . Meningitis   . Coronary artery disease   . Chest pain at rest 09/23/2012  . Chronic bronchitis     "q year last 5 yr or so" (09/24/2012)  . Exertional dyspnea   . Migraines   . Arthritis     "both shoulders" (09/24/2012)  . Chronic lower back pain   . Pneumonia   . Lumbar disc disease   . Iliac artery aneurysm     Right   Past Surgical History  Procedure Laterality Date  . Intussusception repair  10/2011  . Posterior lumbar fusion  1995  . Elbow surgery  ~ 1997    "removed some stones; right" (09/24/2012)  . Appendectomy  2013  . Back surgery  2006    4 BACK SURGERIES  . Abdominal surgery    . Spine surgery      Injury to back 1995   Family  History  Problem Relation Age of Onset  . Heart disease Father   . Heart disease Brother   . Diabetes Brother    History  Substance Use Topics  . Smoking status: Current Every Day Smoker -- 0.50 packs/day for 45 years    Types: Cigarettes    Last Attempt to Quit: 09/24/2012  . Smokeless tobacco: Never Used     Comment: 09/24/2012 "been stopped now ~ 8 month".  3 per day now.  . Alcohol Use: No    Review of Systems  Constitutional: Negative for fever and chills.  Gastrointestinal: Negative for nausea and vomiting.  Musculoskeletal: Positive for back pain.  All other systems reviewed and are negative.     Allergies  Bee venom; Flexeril; Hydrocodone; Nabumetone; [name]; Tramadol; Ace inhibitors; and Other  Home Medications   Prior to Admission medications   Medication Sig Start Date End Date Taking? Authorizing Provider  EPINEPHrine (EPI-PEN) 0.3 mg/0.3 mL SOAJ Inject 0.3 mg into the muscle once as needed (Anaphylaxis).  03/09/13   Montine Circle, PA-C  nitroGLYCERIN (NITROSTAT) 0.4 MG SL tablet Place 0.4 mg under the tongue every 5 (five) minutes as needed for chest pain.  08/11/13   Tonia Brooms, MD  oxyCODONE-acetaminophen (PERCOCET/ROXICET) 5-325 MG per tablet Take 1-2 tablets by mouth every 6 (six) hours as needed for moderate pain or severe pain. 02/19/14   Elwyn Lade, PA-C  oxyCODONE-acetaminophen (PERCOCET/ROXICET) 5-325 MG per tablet Take 1 tablet by mouth every 6 (six) hours as needed for severe pain. 03/09/14   Garald Balding, NP  promethazine (PHENERGAN) 25 MG tablet Take 1 tablet (25 mg total) by mouth every 6 (six) hours as needed for nausea or vomiting. 02/19/14   Elwyn Lade, PA-C   There were no vitals taken for this visit.  Physical Exam  Nursing note and vitals reviewed. Constitutional: He is oriented to person, place, and time. He appears well-developed and well-nourished. No distress.  HENT:  Head: Normocephalic and atraumatic.  Neck: Neck supple.  No tracheal deviation present.  Cardiovascular: Normal rate, regular rhythm and normal heart sounds.  Exam reveals no friction rub.   No murmur heard. Pulmonary/Chest: Effort normal and breath sounds normal. No respiratory distress.  Musculoskeletal:       Lumbar back: He exhibits tenderness, pain and spasm. He exhibits normal range of motion, no bony tenderness, no swelling, no deformity, no laceration and normal pulse.       Back:  Neurological: He is alert and oriented to person, place, and time. He has normal strength and normal reflexes. No sensory deficit. He exhibits normal muscle tone. Coordination and gait normal.  Skin: Skin is warm and dry.    ED Course  Procedures (including critical care time)  DIAGNOSTIC STUDIES: Oxygen Saturation is 98% on room air, normal by my interpretation.    COORDINATION OF CARE: At 1330 Discussed treatment plan with patient which includes morphine and Decadron. Patient agrees.    Patient is advised followup with his primary care Dr. also advised him that the  emergency department.  Is not sufficient, place, to manage chronic pain.  The patient no neurological deficits noted on exam.  Ambulates without difficulty    Brent General, PA-C 03/12/14 1433

## 2014-03-12 NOTE — ED Notes (Signed)
Pt stated that he his back was injured in 1995. Increased pain over the last few months. Denies numbness and tingling in legs

## 2014-03-17 ENCOUNTER — Emergency Department (HOSPITAL_COMMUNITY)
Admission: EM | Admit: 2014-03-17 | Discharge: 2014-03-17 | Disposition: A | Discharge status: Home / Self Care | Payer: Medicare Other | Attending: Emergency Medicine | Admitting: Emergency Medicine

## 2014-03-17 ENCOUNTER — Encounter (HOSPITAL_COMMUNITY): Payer: Self-pay | Admitting: Emergency Medicine

## 2014-03-17 DIAGNOSIS — M545 Low back pain, unspecified: Secondary | ICD-10-CM | POA: Insufficient documentation

## 2014-03-17 DIAGNOSIS — I2510 Atherosclerotic heart disease of native coronary artery without angina pectoris: Secondary | ICD-10-CM | POA: Diagnosis not present

## 2014-03-17 DIAGNOSIS — M5137 Other intervertebral disc degeneration, lumbosacral region: Secondary | ICD-10-CM | POA: Diagnosis not present

## 2014-03-17 DIAGNOSIS — I10 Essential (primary) hypertension: Secondary | ICD-10-CM | POA: Diagnosis not present

## 2014-03-17 DIAGNOSIS — Z8679 Personal history of other diseases of the circulatory system: Secondary | ICD-10-CM | POA: Insufficient documentation

## 2014-03-17 DIAGNOSIS — IMO0002 Reserved for concepts with insufficient information to code with codable children: Secondary | ICD-10-CM | POA: Insufficient documentation

## 2014-03-17 DIAGNOSIS — Z8701 Personal history of pneumonia (recurrent): Secondary | ICD-10-CM | POA: Insufficient documentation

## 2014-03-17 DIAGNOSIS — Z79899 Other long term (current) drug therapy: Secondary | ICD-10-CM | POA: Diagnosis not present

## 2014-03-17 DIAGNOSIS — I723 Aneurysm of iliac artery: Secondary | ICD-10-CM | POA: Insufficient documentation

## 2014-03-17 DIAGNOSIS — M129 Arthropathy, unspecified: Secondary | ICD-10-CM | POA: Diagnosis not present

## 2014-03-17 DIAGNOSIS — F17200 Nicotine dependence, unspecified, uncomplicated: Secondary | ICD-10-CM | POA: Insufficient documentation

## 2014-03-17 DIAGNOSIS — G8929 Other chronic pain: Secondary | ICD-10-CM | POA: Diagnosis not present

## 2014-03-17 DIAGNOSIS — Z21 Asymptomatic human immunodeficiency virus [HIV] infection status: Secondary | ICD-10-CM | POA: Diagnosis not present

## 2014-03-17 DIAGNOSIS — R109 Unspecified abdominal pain: Secondary | ICD-10-CM | POA: Diagnosis present

## 2014-03-17 DIAGNOSIS — Z8709 Personal history of other diseases of the respiratory system: Secondary | ICD-10-CM | POA: Diagnosis not present

## 2014-03-17 DIAGNOSIS — M51379 Other intervertebral disc degeneration, lumbosacral region without mention of lumbar back pain or lower extremity pain: Secondary | ICD-10-CM | POA: Insufficient documentation

## 2014-03-17 DIAGNOSIS — Z8669 Personal history of other diseases of the nervous system and sense organs: Secondary | ICD-10-CM | POA: Insufficient documentation

## 2014-03-17 LAB — BASIC METABOLIC PANEL
BUN: 18 mg/dL (ref 6–23)
CO2: 27 mEq/L (ref 19–32)
Calcium: 9.4 mg/dL (ref 8.4–10.5)
Chloride: 102 mEq/L (ref 96–112)
Creatinine, Ser: 0.88 mg/dL (ref 0.50–1.35)
GFR calc Af Amer: >90 mL/min (ref >90)
GFR calc non Af Amer: >90 mL/min (ref >90)
Glucose, Bld: 129 mg/dL — ABNORMAL HIGH (ref 70–99)
Potassium: 3.8 mEq/L (ref 3.7–5.3)
Sodium: 140 mEq/L (ref 137–147)

## 2014-03-17 LAB — CBC
HCT: 41.8 % (ref 39.0–52.0)
Hemoglobin: 13.9 g/dL (ref 13.0–17.0)
MCH: 26.7 pg (ref 26.0–34.0)
MCHC: 33.3 g/dL (ref 30.0–36.0)
MCV: 80.4 fL (ref 78.0–100.0)
Platelets: 311 10*3/uL (ref 150–400)
RBC: 5.2 MIL/uL (ref 4.22–5.81)
RDW: 15.7 % — ABNORMAL HIGH (ref 11.5–15.5)
WBC: 10.9 10*3/uL — ABNORMAL HIGH (ref 4.0–10.5)

## 2014-03-17 MED ORDER — METHOCARBAMOL 750 MG PO TABS: 750.0000 mg | ORAL_TABLET | Freq: Four times a day (QID) | ORAL | Status: DC

## 2014-03-17 MED ORDER — METHOCARBAMOL 500 MG PO TABS
1000.0000 mg | ORAL_TABLET | Freq: Once | ORAL | Status: AC
  Administered 2014-03-17: 1000 mg via ORAL
  Filled 2014-03-17: qty 2

## 2014-03-17 MED ORDER — ONDANSETRON HCL 4 MG/2ML IJ SOLN
4.0000 mg | Freq: Once | INTRAMUSCULAR | Status: AC
  Administered 2014-03-17: 4 mg via INTRAVENOUS
  Filled 2014-03-17: qty 2

## 2014-03-17 MED ORDER — FENTANYL CITRATE 0.05 MG/ML IJ SOLN
50.0000 ug | Freq: Once | INTRAMUSCULAR | Status: AC
  Administered 2014-03-17: 50 ug via INTRAVENOUS
  Filled 2014-03-17: qty 2

## 2014-03-17 NOTE — ED Notes (Signed)
Pt stated that he is not able to urinate.   Will try again in 30 minutes. 

## 2014-03-17 NOTE — ED Provider Notes (Signed)
CSN: [id number]     Arrival date & time 03/17/14  0047 History   First MD Initiated Contact with Patient 03/17/14 0149     Chief Complaint  Patient presents with  . Flank Pain     (Consider location/radiation/quality/duration/timing/severity/associated sxs/prior Treatment) HPI 58 year old male presents to emergency department with complaint of back pain.  Patient reports he has had chronic back pain since having injury in 2006.  He reports several prior back surgeries.  Patient has been seen in emergency department several times over the last several weeks to months.  Patient reports he is pending a pain clinic in [place].  He reports he took the Percocet and Valium prescribed to him on his last visit a few days ago without improvement.  Patient reports a spasm-like pain on either side of his spine in the middle of his back.  He denies any weight loss, no fevers or chills, no bowel or bladder incontinence.  Patient is HIV positive, but not currently on any antiretrovirals.  Pain is similar to his chronic back pain.  Patient is followed by internal medicine clinic. Past Medical History  Diagnosis Date  . HIV positive 2004  . Hypertension   . Meningitis   . Coronary artery disease   . Chest pain at rest 09/23/2012  . Chronic bronchitis     "q year last 5 yr or so" (09/24/2012)  . Exertional dyspnea   . Migraines   . Arthritis     "both shoulders" (09/24/2012)  . Chronic lower back pain   . Pneumonia   . Lumbar disc disease   . Iliac artery aneurysm     Right   Past Surgical History  Procedure Laterality Date  . Intussusception repair  10/2011  . Posterior lumbar fusion  1995  . Elbow surgery  ~ 1997    "removed some stones; right" (09/24/2012)  . Appendectomy  2013  . Back surgery  2006    4 BACK SURGERIES  . Abdominal surgery    . Spine surgery      Injury to back 1995   Family History  Problem Relation Age of Onset  . Heart disease Father   . Heart disease Brother   . Diabetes  Brother    History  Substance Use Topics  . Smoking status: Current Every Day Smoker -- 0.50 packs/day for 45 years    Types: Cigarettes    Last Attempt to Quit: 09/24/2012  . Smokeless tobacco: Never Used     Comment: 09/24/2012 "been stopped now ~ 8 month".  3 per day now.  . Alcohol Use: No    Review of Systems   See History of Present Illness; otherwise all other systems are reviewed and negative  Allergies  Bee venom; Flexeril; Hydrocodone; Nabumetone; [name]; Tramadol; Ace inhibitors; and Other  Home Medications   Prior to Admission medications   Medication Sig Start Date End Date Taking? Authorizing Provider  diazepam (VALIUM) 5 MG tablet Take 1 tablet (5 mg total) by mouth every 8 (eight) hours as needed for muscle spasms. 03/12/14  Yes Resa Miner Lawyer, PA-C  oxyCODONE-acetaminophen (PERCOCET/ROXICET) 5-325 MG per tablet Take 1-2 tablets by mouth every 6 (six) hours as needed for moderate pain or severe pain. 02/19/14  Yes Elwyn Lade, PA-C  predniSONE (DELTASONE) 50 MG tablet Take 1 tablet (50 mg total) by mouth daily. 03/12/14  Yes Resa Miner Lawyer, PA-C  EPINEPHrine (EPI-PEN) 0.3 mg/0.3 mL SOAJ Inject 0.3 mg into the muscle once as  needed (Anaphylaxis).  03/09/13   Montine Circle, PA-C  methocarbamol (ROBAXIN-750) 750 MG tablet Take 1 tablet (750 mg total) by mouth 4 (four) times daily. 03/17/14   Kalman Drape, MD  nitroGLYCERIN (NITROSTAT) 0.4 MG SL tablet Place 0.4 mg under the tongue every 5 (five) minutes as needed for chest pain.  08/11/13   Tonia Brooms, MD   BP 168/93  Pulse 64  Temp(Src) 98.4 F (36.9 C) (Oral)  Resp 20  Ht 5\' 8"  (1.727 m)  Wt 160 lb (72.576 kg)  BMI 24.33 kg/m2  SpO2 100% Physical Exam  Nursing note and vitals reviewed. Constitutional: He is oriented to person, place, and time. He appears well-developed and well-nourished. He appears distressed.  Cardiovascular: Normal rate, regular rhythm, normal heart sounds and intact distal  pulses.  Exam reveals no gallop and no friction rub.   No murmur heard. Pulmonary/Chest: Effort normal and breath sounds normal. No respiratory distress. He has no wheezes. He has no rales. He exhibits no tenderness.  Abdominal: Soft. Bowel sounds are normal. He exhibits no distension and no mass. There is no tenderness. There is no rebound and no guarding.  Musculoskeletal: Normal range of motion. He exhibits tenderness (patient has diffuse tenderness to his entire back.  Patient is distractible at times, and I am able to palpate certain areas without flinching). He exhibits no edema.  Neurological: He is alert and oriented to person, place, and time. He displays normal reflexes. No cranial nerve deficit. He exhibits normal muscle tone. Coordination normal.  Skin: Skin is warm and dry. No rash noted. No erythema. No pallor.    ED Course  Procedures (including critical care time) Labs Review Labs Reviewed  CBC - Abnormal; Notable for the following:    WBC 10.9 (*)    RDW 15.7 (*)    All other components within normal limits  BASIC METABOLIC PANEL - Abnormal; Notable for the following:    Glucose, Bld 129 (*)    All other components within normal limits  URINALYSIS, ROUTINE W REFLEX MICROSCOPIC    Imaging Review No results found.   EKG Interpretation None      MDM   Final diagnoses:  Chronic lower back pain    58 year old male with chronic back pain, patient has been again educated that the emergency department is not the best venue for his chronic pain issues.  He should followup as per care Dr. and/or with the pain clinic that he is due to see next week.  Patient has been offered a prescription for Robaxin to help with spasm.    Kalman Drape, MD 03/17/14 [phone]

## 2014-03-17 NOTE — ED Notes (Signed)
Patient c/o bilateral flank pain. Denies urinary symptoms

## 2014-03-17 NOTE — Discharge Instructions (Signed)
Chronic Back Pain  When back pain lasts longer than 3 months, it is called chronic back pain.People with chronic back pain often go through certain periods that are more intense (flare-ups).  CAUSES Chronic back pain can be caused by wear and tear (degeneration) on different structures in your back. These structures include:  The bones of your spine (vertebrae) and the joints surrounding your spinal cord and nerve roots (facets).  The strong, fibrous tissues that connect your vertebrae (ligaments). Degeneration of these structures may result in pressure on your nerves. This can lead to constant pain. HOME CARE INSTRUCTIONS  Avoid bending, heavy lifting, prolonged sitting, and activities which make the problem worse.  Take brief periods of rest throughout the day to reduce your pain. Lying down or standing usually is better than sitting while you are resting.  Take over-the-counter or prescription medicines only as directed by your caregiver. SEEK IMMEDIATE MEDICAL CARE IF:   You have weakness or numbness in one of your legs or feet.  You have trouble controlling your bladder or bowels.  You have nausea, vomiting, abdominal pain, shortness of breath, or fainting. Document Released: 10/12/2004 Document Revised: 11/27/2011 Document Reviewed: 08/19/2011 [facility] Patient Information 2015 [place], [place]. This information is not intended to replace advice given to you by your health care provider. Make sure you discuss any questions you have with your health care provider.  Chronic Pain Chronic pain can be defined as pain that is off and on and lasts for 3-6 months or longer. Many things cause chronic pain, which can make it difficult to make a diagnosis. There are many treatment options available for chronic pain. However, finding a treatment that works well for you may require trying various approaches until the right one is found. Many people benefit from a combination of two or more types  of treatment to control their pain. SYMPTOMS  Chronic pain can occur anywhere in the body and can range from mild to very severe. Some types of chronic pain include:  Headache.  Low back pain.  Cancer pain.  Arthritis pain.  Neurogenic pain. This is pain resulting from damage to nerves. People with chronic pain may also have other symptoms such as:  Depression.  Anger.  Insomnia.  Anxiety. DIAGNOSIS  Your health care provider will help diagnose your condition over time. In many cases, the initial focus will be on excluding possible conditions that could be causing the pain. Depending on your symptoms, your health care provider may order tests to diagnose your condition. Some of these tests may include:   Blood tests.   CT scan.   MRI.   X-rays.   Ultrasounds.   Nerve conduction studies.  You may need to see a specialist.  TREATMENT  Finding treatment that works well may take time. You may be referred to a pain specialist. He or she may prescribe medicine or therapies, such as:   Mindful meditation or yoga.  Shots (injections) of numbing or pain-relieving medicines into the spine or area of pain.  Local electrical stimulation.  Acupuncture.   Massage therapy.   Aroma, color, light, or sound therapy.   Biofeedback.   Working with a physical therapist to keep from getting stiff.   Regular, gentle exercise.   Cognitive or behavioral therapy.   Group support.  Sometimes, surgery may be recommended.  HOME CARE INSTRUCTIONS   Take all medicines as directed by your health care provider.   Lessen stress in your life by relaxing and doing  things such as listening to calming music.   Exercise or be active as directed by your health care provider.   Eat a healthy diet and include things such as vegetables, fruits, fish, and lean meats in your diet.   Keep all follow-up appointments with your health care provider.   Attend a support  group with others suffering from chronic pain. SEEK MEDICAL CARE IF:   Your pain gets worse.   You develop a new pain that was not there before.   You cannot tolerate medicines given to you by your health care provider.   You have new symptoms since your last visit with your health care provider.  SEEK IMMEDIATE MEDICAL CARE IF:   You feel weak.   You have decreased sensation or numbness.   You lose control of bowel or bladder function.   Your pain suddenly gets much worse.   You develop shaking.  You develop chills.  You develop confusion.  You develop chest pain.  You develop shortness of breath.  MAKE SURE YOU:  Understand these instructions.  Will watch your condition.  Will get help right away if you are not doing well or get worse. Document Released: 05/27/2002 Document Revised: 05/07/2013 Document Reviewed: 02/28/2013 [facility] Patient Information 2015 [place], [place]. This information is not intended to replace advice given to you by your health care provider. Make sure you discuss any questions you have with your health care provider.

## 2014-03-20 ENCOUNTER — Encounter (HOSPITAL_COMMUNITY): Payer: Self-pay | Admitting: Emergency Medicine

## 2014-03-20 ENCOUNTER — Emergency Department (HOSPITAL_COMMUNITY)
Admission: EM | Admit: 2014-03-20 | Discharge: 2014-03-20 | Disposition: A | Discharge status: Home / Self Care | Payer: Medicare Other | Attending: Emergency Medicine | Admitting: Emergency Medicine

## 2014-03-20 DIAGNOSIS — M545 Low back pain, unspecified: Secondary | ICD-10-CM | POA: Diagnosis present

## 2014-03-20 DIAGNOSIS — I10 Essential (primary) hypertension: Secondary | ICD-10-CM | POA: Diagnosis not present

## 2014-03-20 DIAGNOSIS — Z21 Asymptomatic human immunodeficiency virus [HIV] infection status: Secondary | ICD-10-CM | POA: Insufficient documentation

## 2014-03-20 DIAGNOSIS — Z79899 Other long term (current) drug therapy: Secondary | ICD-10-CM | POA: Insufficient documentation

## 2014-03-20 DIAGNOSIS — M549 Dorsalgia, unspecified: Secondary | ICD-10-CM

## 2014-03-20 DIAGNOSIS — I2510 Atherosclerotic heart disease of native coronary artery without angina pectoris: Secondary | ICD-10-CM | POA: Insufficient documentation

## 2014-03-20 DIAGNOSIS — M129 Arthropathy, unspecified: Secondary | ICD-10-CM | POA: Diagnosis not present

## 2014-03-20 DIAGNOSIS — F17200 Nicotine dependence, unspecified, uncomplicated: Secondary | ICD-10-CM | POA: Diagnosis not present

## 2014-03-20 DIAGNOSIS — G8929 Other chronic pain: Secondary | ICD-10-CM | POA: Diagnosis not present

## 2014-03-20 DIAGNOSIS — Z8709 Personal history of other diseases of the respiratory system: Secondary | ICD-10-CM | POA: Diagnosis not present

## 2014-03-20 DIAGNOSIS — IMO0002 Reserved for concepts with insufficient information to code with codable children: Secondary | ICD-10-CM | POA: Insufficient documentation

## 2014-03-20 DIAGNOSIS — Z8701 Personal history of pneumonia (recurrent): Secondary | ICD-10-CM | POA: Diagnosis not present

## 2014-03-20 MED ORDER — OXYCODONE-ACETAMINOPHEN 5-325 MG PO TABS: 1.0000 | ORAL_TABLET | Freq: Four times a day (QID) | ORAL | Status: DC | PRN

## 2014-03-20 MED ORDER — OXYCODONE-ACETAMINOPHEN 5-325 MG PO TABS
1.0000 | ORAL_TABLET | Freq: Once | ORAL | Status: AC
  Administered 2014-03-20: 1 via ORAL
  Filled 2014-03-20: qty 1

## 2014-03-20 NOTE — ED Provider Notes (Signed)
Medical screening examination/treatment/procedure(s) were performed by non-physician practitioner and as supervising physician I was immediately available for consultation/collaboration.  Ramsay Bognar L Gretta Samons, MD 03/20/14 1627 

## 2014-03-20 NOTE — ED Provider Notes (Signed)
CSN: [id number]     Arrival date & time 03/20/14  0109 History   First MD Initiated Contact with Patient 03/20/14 1019     Chief Complaint  Patient presents with  . Back Pain     (Consider location/radiation/quality/duration/timing/severity/associated sxs/prior Treatment) HPI Comments: Kenneth [name] is a 58 y.o. Male with a PMHx of chronic back pain presenting today due to continued back pain. Has been seen twice in the last week. Has an appt with his PCP on 03/30/14. States this pain is the result of an injury from 7. Pain is chronic, ongoing, constant, 8/10, non-radiating. No aggravating or alleviating factors. Pain is the same as his typical back pain, he just hasn't had follow up yet. Usually takes Oxycodone which helps. Denies paresthesias, CP, SOB, weakness, loss of bowel/bladder function or saddle anesthesia. Denies urinary symptoms.   The history is provided by the patient. No language interpreter was used.    Past Medical History  Diagnosis Date  . HIV positive 2004  . Hypertension   . Meningitis   . Coronary artery disease   . Chest pain at rest 09/23/2012  . Chronic bronchitis     "q year last 5 yr or so" (09/24/2012)  . Exertional dyspnea   . Migraines   . Arthritis     "both shoulders" (09/24/2012)  . Chronic lower back pain   . Pneumonia   . Lumbar disc disease   . Iliac artery aneurysm     Right   Past Surgical History  Procedure Laterality Date  . Intussusception repair  10/2011  . Posterior lumbar fusion  1995  . Elbow surgery  ~ 1997    "removed some stones; right" (09/24/2012)  . Appendectomy  2013  . Back surgery  2006    4 BACK SURGERIES  . Abdominal surgery    . Spine surgery      Injury to back 1995   Family History  Problem Relation Age of Onset  . Heart disease Father   . Heart disease Brother   . Diabetes Brother    History  Substance Use Topics  . Smoking status: Current Every Day Smoker -- 0.50 packs/day for 45 years    Types: Cigarettes   Last Attempt to Quit: 09/24/2012  . Smokeless tobacco: Never Used     Comment: 09/24/2012 "been stopped now ~ 8 month".  3 per day now.  . Alcohol Use: No    Review of Systems  Constitutional: Negative for fever and chills.  Respiratory: Negative for shortness of breath.   Cardiovascular: Negative for chest pain and leg swelling.  Gastrointestinal: Negative for nausea, vomiting, abdominal pain and constipation.  Genitourinary: Negative for dysuria, urgency, hematuria and difficulty urinating.  Musculoskeletal: Positive for back pain. Negative for arthralgias, joint swelling, neck pain and neck stiffness.  Neurological: Negative for dizziness, weakness, light-headedness and numbness.      Allergies  Bee venom; Flexeril; Hydrocodone; Nabumetone; [name]; Tramadol; Ace inhibitors; and Other  Home Medications   Prior to Admission medications   Medication Sig Start Date End Date Taking? Authorizing Provider  methocarbamol (ROBAXIN-750) 750 MG tablet Take 1 tablet (750 mg total) by mouth 4 (four) times daily. 03/17/14  Yes Kalman Drape, MD  oxyCODONE-acetaminophen (PERCOCET/ROXICET) 5-325 MG per tablet Take 1-2 tablets by mouth every 6 (six) hours as needed for moderate pain or severe pain. 02/19/14  Yes Elwyn Lade, PA-C  predniSONE (DELTASONE) 50 MG tablet Take 1 tablet (50 mg total) by mouth daily.  03/12/14  Yes Resa Miner Lawyer, PA-C  diazepam (VALIUM) 5 MG tablet Take 1 tablet (5 mg total) by mouth every 8 (eight) hours as needed for muscle spasms. 03/12/14   Resa Miner Lawyer, PA-C  EPINEPHrine (EPI-PEN) 0.3 mg/0.3 mL SOAJ Inject 0.3 mg into the muscle once as needed (Anaphylaxis).  03/09/13   Montine Circle, PA-C  nitroGLYCERIN (NITROSTAT) 0.4 MG SL tablet Place 0.4 mg under the tongue every 5 (five) minutes as needed for chest pain.  08/11/13   Tonia Brooms, MD  oxyCODONE-acetaminophen (PERCOCET/ROXICET) 5-325 MG per tablet Take 1-2 tablets by mouth every 6 (six) hours as  needed for severe pain. 03/20/14   Rakeen Gaillard Strupp Camprubi-Soms, PA-C   BP 156/105  Pulse 98  Temp(Src) 97.9 F (36.6 C) (Oral)  Resp 20  SpO2 99% Physical Exam  Nursing note and vitals reviewed. Constitutional: He is oriented to person, place, and time. Vital signs are normal. He appears well-developed and well-nourished. No distress.  HENT:  Head: Normocephalic and atraumatic.  Mouth/Throat: Mucous membranes are normal.  Eyes: Conjunctivae and EOM are normal. Right eye exhibits no discharge. Left eye exhibits no discharge.  Neck: Normal range of motion. Neck supple.  Cardiovascular: Normal rate and intact distal pulses.   Pulmonary/Chest: Effort normal.  Abdominal: Normal appearance. He exhibits no distension. There is no CVA tenderness.  No CVA TTP  Musculoskeletal: Normal range of motion.       Lumbar back: He exhibits tenderness.  Lumbar spine TTP with midline scar noted. Minimal paraspinous spasms. Strength 5/5 bilaterally in all extremities  Neurological: He is alert and oriented to person, place, and time. He has normal strength and normal reflexes. No sensory deficit.  Strength 5/5 in all extremities, sensation grossly intact.  Skin: Skin is warm, dry and intact. No rash noted.  Psychiatric: He has a normal mood and affect.    ED Course  Procedures (including critical care time) Labs Review Labs Reviewed - No data to display  Imaging Review No results found.   EKG Interpretation None      MDM   Final diagnoses:  Back pain, chronic   Kenneth [name] is a 58 y.o. male presenting for chronic back pain. States he's got f/up with PCP on 7/13. Discussed with pt that the ED is not the correct place for chronic pain issues, and he needs to establish care. Has been given Valium and Robaxin in the last week. Will give few Percocet, and d/c. Low clinical suspicion for cauda equina, or urinary sources of pain. No red flag s/s of low back pain. No s/s of central cord  compression. Lower extremities are neurovascularly intact and patient is ambulating without difficulty. Urged patient not to drink alcohol, drive, or perform any other activities that requires focus while taking medications. The patient verbalizes understanding and agrees with the plan. Stable at d/c.  BP 156/105  Pulse 98  Temp(Src) 97.9 F (36.6 C) (Oral)  Resp 20  SpO2 99%     Patty Sermons Camprubi-Soms, PA-C 03/20/14 1215

## 2014-03-20 NOTE — ED Notes (Signed)
Pt has a hx of back pain. Pt has an appt with a new PCP 7/13. Pt has not improved and he has grandchildren with him this weekend and wants to see about getting better for them. No new injuries noted.

## 2014-03-20 NOTE — Discharge Instructions (Signed)
Back Pain:  Your back pain should be treated with medicines such as ibuprofen or aleve and this back pain should get better over the next 2 weeks.  However if you develop severe or worsening pain, low back pain with fever, numbness, weakness or inability to walk or urinate, you should return to the ER immediately.  Please follow up with your doctor this week for a recheck if still having symptoms.  Low back pain is discomfort in the lower back that may be due to injuries to muscles and ligaments around the spine.  Occasionally, it may be caused by a a problem to a part of the spine called a disc.  The pain may last several days or a week;  However, most patients get completely well in 4 weeks.  Self - care:  The application of heat can help soothe the pain.  Maintaining your daily activities, including walking, is encourged, as it will help you get better faster than just staying in bed. Perform gentle stretching as discussed. Drink plenty of fluids.  Medications are also useful to help with pain control.  A commonly prescribed medications includes acetaminophen.  This medication is generally safe, though you should not take more than 6 of the extra strength (500mg ) pills a day.  Non steroidal anti inflammatory medications including Ibuprofen and naproxen;  These medications help both pain and swelling and are very useful in treating back pain.  They should be taken with food, as they can cause stomach upset, and more seriously, stomach bleeding.    Muscle relaxants:  These medications can help with muscle tightness that is a cause of lower back pain.  Most of these medications can cause drowsiness, and it is not safe to drive or use dangerous machinery while taking them.  SEEK IMMEDIATE MEDICAL ATTENTION IF: New numbness, tingling, weakness, or problem with the use of your arms or legs.  Severe back pain not relieved with medications.  Difficulty with or loss of control of your bowel or bladder  control.  Increasing pain in any areas of the body (such as chest or abdominal pain).  Shortness of breath, dizziness or fainting.  Nausea (feeling sick to your stomach), vomiting, fever, or sweats.  You will need to follow up with  Your primary healthcare provider in 1-2 weeks for reassessment.  If you do not have a doctor see the list below.  Back Exercises Back exercises help treat and prevent back injuries. The goal of back exercises is to increase the strength of your abdominal and back muscles and the flexibility of your back. These exercises should be started when you no longer have back pain. Back exercises include:  Pelvic Tilt. Lie on your back with your knees bent. Tilt your pelvis until the lower part of your back is against the floor. Hold this position 5 to 10 sec and repeat 5 to 10 times.  Knee to Chest. Pull first 1 knee up against your chest and hold for 20 to 30 seconds, repeat this with the other knee, and then both knees. This may be done with the other leg straight or bent, whichever feels better.  Sit-Ups or Curl-Ups. Bend your knees 90 degrees. Start with tilting your pelvis, and do a partial, slow sit-up, lifting your trunk only 30 to 45 degrees off the floor. Take at least 2 to 3 seconds for each sit-up. Do not do sit-ups with your knees out straight. If partial sit-ups are difficult, simply do the above but  with only tightening your abdominal muscles and holding it as directed.  Hip-Lift. Lie on your back with your knees flexed 90 degrees. Push down with your feet and shoulders as you raise your hips a couple inches off the floor; hold for 10 seconds, repeat 5 to 10 times.  Back arches. Lie on your stomach, propping yourself up on bent elbows. Slowly press on your hands, causing an arch in your low back. Repeat 3 to 5 times. Any initial stiffness and discomfort should lessen with repetition over time.  Shoulder-Lifts. Lie face down with arms beside your body. Keep hips  and torso pressed to floor as you slowly lift your head and shoulders off the floor. Do not overdo your exercises, especially in the beginning. Exercises may cause you some mild back discomfort which lasts for a few minutes; however, if the pain is more severe, or lasts for more than 15 minutes, do not continue exercises until you see your caregiver. Improvement with exercise therapy for back problems is slow.  See your caregivers for assistance with developing a proper back exercise program. Document Released: 10/12/2004 Document Revised: 11/27/2011 Document Reviewed: 07/06/2011 [facility] Patient Information 2015 [place], [place]. This information is not intended to replace advice given to you by your health care provider. Make sure you discuss any questions you have with your health care provider.

## 2014-03-26 ENCOUNTER — Emergency Department (HOSPITAL_COMMUNITY)
Admission: EM | Admit: 2014-03-26 | Discharge: 2014-03-26 | Disposition: A | Discharge status: Home / Self Care | Payer: Medicare Other | Attending: Emergency Medicine | Admitting: Emergency Medicine

## 2014-03-26 ENCOUNTER — Encounter (HOSPITAL_COMMUNITY): Payer: Self-pay | Admitting: Emergency Medicine

## 2014-03-26 DIAGNOSIS — Z8701 Personal history of pneumonia (recurrent): Secondary | ICD-10-CM | POA: Diagnosis not present

## 2014-03-26 DIAGNOSIS — M545 Low back pain, unspecified: Secondary | ICD-10-CM

## 2014-03-26 DIAGNOSIS — Z21 Asymptomatic human immunodeficiency virus [HIV] infection status: Secondary | ICD-10-CM | POA: Diagnosis not present

## 2014-03-26 DIAGNOSIS — Z8669 Personal history of other diseases of the nervous system and sense organs: Secondary | ICD-10-CM | POA: Insufficient documentation

## 2014-03-26 DIAGNOSIS — I10 Essential (primary) hypertension: Secondary | ICD-10-CM | POA: Diagnosis not present

## 2014-03-26 DIAGNOSIS — I2510 Atherosclerotic heart disease of native coronary artery without angina pectoris: Secondary | ICD-10-CM | POA: Insufficient documentation

## 2014-03-26 DIAGNOSIS — G8929 Other chronic pain: Secondary | ICD-10-CM | POA: Diagnosis not present

## 2014-03-26 DIAGNOSIS — M129 Arthropathy, unspecified: Secondary | ICD-10-CM | POA: Diagnosis not present

## 2014-03-26 DIAGNOSIS — F17200 Nicotine dependence, unspecified, uncomplicated: Secondary | ICD-10-CM | POA: Insufficient documentation

## 2014-03-26 DIAGNOSIS — IMO0002 Reserved for concepts with insufficient information to code with codable children: Secondary | ICD-10-CM | POA: Diagnosis not present

## 2014-03-26 DIAGNOSIS — Z8709 Personal history of other diseases of the respiratory system: Secondary | ICD-10-CM | POA: Insufficient documentation

## 2014-03-26 DIAGNOSIS — Z9889 Other specified postprocedural states: Secondary | ICD-10-CM | POA: Insufficient documentation

## 2014-03-26 MED ORDER — CYCLOBENZAPRINE HCL 10 MG PO TABS: 10.0000 mg | ORAL_TABLET | Freq: Two times a day (BID) | ORAL | Status: DC | PRN

## 2014-03-26 MED ORDER — METHOCARBAMOL 500 MG PO TABS: 500.0000 mg | ORAL_TABLET | Freq: Two times a day (BID) | ORAL | Status: DC

## 2014-03-26 MED ORDER — PREDNISONE 20 MG PO TABS: 60.0000 mg | ORAL_TABLET | Freq: Every day | ORAL | Status: DC

## 2014-03-26 MED ORDER — OXYCODONE-ACETAMINOPHEN 5-325 MG PO TABS
1.0000 | ORAL_TABLET | Freq: Once | ORAL | Status: AC
  Administered 2014-03-26: 1 via ORAL
  Filled 2014-03-26: qty 1

## 2014-03-26 NOTE — ED Notes (Addendum)
Patient c/o back pain x 5 weeks and recently has gotten worse. Patient denies any numbness of extremities.

## 2014-03-26 NOTE — Discharge Instructions (Signed)

## 2014-03-26 NOTE — ED Notes (Signed)
Initial Contact - pt A+Ox4, ambulatory with steady gait, reports c/o acute on chronic back pain, sts "was working in the yard", denies n/t to extremities, denies b/b changes or complaints.  Skin PWD.  Speaking full/clear sentences, rr even/un-lab.  MAEI.  NAD.

## 2014-03-26 NOTE — ED Provider Notes (Signed)
CSN: [id number]     Arrival date & time 03/26/14  1633 History  This chart was scribed for non-physician practitioner, Dewaine Oats, PA-C, working with Arbie Cookey, *, by Delphia Grates, ED Scribe. This patient was seen in room WTR6/WTR6 and the patient's care was started at 8:36 PM.     Chief Complaint  Patient presents with  . Back Pain     The history is provided by the patient. No language interpreter was used.    HPI Comments: Kenneth [name] is a 58 y.o. male who presents to the Emergency Department complaining of gradually worsening, chronic back pain that began 5 weeks ago. Patient has been seen at the ED multiple times in the past 2 months for the same. Patient has history of 4 back surgeries and states he has an appointment iwht his PCP July, 13, 2015 for a follow-up. He denies numbness, weakness, paresthesia, or bowel/bladder incontinence. Patient is HIV positive and has history of HTN and CAD.   Past Medical History  Diagnosis Date  . HIV positive 2004  . Hypertension   . Meningitis   . Coronary artery disease   . Chest pain at rest 09/23/2012  . Chronic bronchitis     "q year last 5 yr or so" (09/24/2012)  . Exertional dyspnea   . Migraines   . Arthritis     "both shoulders" (09/24/2012)  . Chronic lower back pain   . Pneumonia   . Lumbar disc disease   . Iliac artery aneurysm     Right   Past Surgical History  Procedure Laterality Date  . Intussusception repair  10/2011  . Posterior lumbar fusion  1995  . Elbow surgery  ~ 1997    "removed some stones; right" (09/24/2012)  . Appendectomy  2013  . Back surgery  2006    4 BACK SURGERIES  . Abdominal surgery    . Spine surgery      Injury to back 1995   Family History  Problem Relation Age of Onset  . Heart disease Father   . Heart disease Brother   . Diabetes Brother    History  Substance Use Topics  . Smoking status: Current Every Day Smoker -- 0.50 packs/day for 45 years    Types: Cigarettes     Last Attempt to Quit: 09/24/2012  . Smokeless tobacco: Never Used     Comment: 09/24/2012 "been stopped now ~ 8 month".  3 per day now.  . Alcohol Use: No    Review of Systems  Constitutional: Negative for fever.  Genitourinary: Negative for dysuria.  Musculoskeletal: Positive for back pain.  All other systems reviewed and are negative.     Allergies  Bee venom; Flexeril; Hydrocodone; Nabumetone; [name]; Tramadol; Ace inhibitors; and Other  Home Medications   Prior to Admission medications   Medication Sig Start Date End Date Taking? Authorizing Provider  diazepam (VALIUM) 5 MG tablet Take 1 tablet (5 mg total) by mouth every 8 (eight) hours as needed for muscle spasms. 03/12/14  Yes Resa Miner Lawyer, PA-C  oxyCODONE-acetaminophen (PERCOCET/ROXICET) 5-325 MG per tablet Take 1-2 tablets by mouth every 6 (six) hours as needed for severe pain. 03/20/14  Yes Mercedes Strupp Camprubi-Soms, PA-C  predniSONE (DELTASONE) 50 MG tablet Take 1 tablet (50 mg total) by mouth daily. 03/12/14  Yes Resa Miner Lawyer, PA-C  EPINEPHrine (EPI-PEN) 0.3 mg/0.3 mL SOAJ Inject 0.3 mg into the muscle once as needed (Anaphylaxis).  03/09/13   Herbie Baltimore  Browning, PA-C  nitroGLYCERIN (NITROSTAT) 0.4 MG SL tablet Place 0.4 mg under the tongue every 5 (five) minutes as needed for chest pain.  08/11/13   Tonia Brooms, MD   Triage Vitals: BP 127/93  Pulse 91  Temp(Src) 98.4 F (36.9 C) (Oral)  Resp 20  SpO2 99%  Physical Exam  Nursing note and vitals reviewed. Constitutional: He is oriented to person, place, and time. He appears well-developed and well-nourished. No distress.  HENT:  Head: Normocephalic and atraumatic.  Eyes: Conjunctivae and EOM are normal.  Neck: Neck supple.  Cardiovascular: Normal rate.   Pulmonary/Chest: Effort normal.  Musculoskeletal: Normal range of motion. He exhibits tenderness.  Bilateral lower back tenderness without tenderness. Well-healed midline scar. FROM in lower  extremities. Fully weight bearing without ataxia.  Neurological: He is alert and oriented to person, place, and time.  Skin: Skin is warm and dry.  Psychiatric: He has a normal mood and affect. His behavior is normal.    ED Course  Procedures (including critical care time)  DIAGNOSTIC STUDIES: Oxygen Saturation is 99% on room air, normal by my interpretation.    COORDINATION OF CARE: At 2039 Discussed treatment plan with patient which includes Flexeril and Deltasone. Patient agrees.   Labs Review Labs Reviewed - No data to display  Imaging Review No results found.   EKG Interpretation None      MDM   Final diagnoses:  Chronic low back pain    Pain treated in ED without narcotic prescriptions for home based on numerous ED visits for chronic pain. Encouraged to keep outpatient appointment.   I personally performed the services described in this documentation, which was scribed in my presence. The recorded information has been reviewed and is accurate.     Dewaine Oats, PA-C 03/26/14 2132

## 2014-03-26 NOTE — ED Provider Notes (Signed)
Medical screening examination/treatment/procedure(s) were performed by non-physician practitioner and as supervising physician I was immediately available for consultation/collaboration.   EKG Interpretation None        Houston Siren III, MD 03/26/14 2221

## 2014-03-29 ENCOUNTER — Emergency Department (HOSPITAL_COMMUNITY)
Admission: EM | Admit: 2014-03-29 | Discharge: 2014-03-29 | Disposition: A | Discharge status: Home / Self Care | Payer: Medicare Other | Attending: Emergency Medicine | Admitting: Emergency Medicine

## 2014-03-29 ENCOUNTER — Encounter (HOSPITAL_COMMUNITY): Payer: Self-pay | Admitting: Emergency Medicine

## 2014-03-29 DIAGNOSIS — Z8661 Personal history of infections of the central nervous system: Secondary | ICD-10-CM | POA: Diagnosis not present

## 2014-03-29 DIAGNOSIS — Z8739 Personal history of other diseases of the musculoskeletal system and connective tissue: Secondary | ICD-10-CM | POA: Diagnosis not present

## 2014-03-29 DIAGNOSIS — Z21 Asymptomatic human immunodeficiency virus [HIV] infection status: Secondary | ICD-10-CM | POA: Insufficient documentation

## 2014-03-29 DIAGNOSIS — G8929 Other chronic pain: Secondary | ICD-10-CM | POA: Insufficient documentation

## 2014-03-29 DIAGNOSIS — Z8701 Personal history of pneumonia (recurrent): Secondary | ICD-10-CM | POA: Diagnosis not present

## 2014-03-29 DIAGNOSIS — F17200 Nicotine dependence, unspecified, uncomplicated: Secondary | ICD-10-CM | POA: Diagnosis not present

## 2014-03-29 DIAGNOSIS — Z8679 Personal history of other diseases of the circulatory system: Secondary | ICD-10-CM | POA: Diagnosis not present

## 2014-03-29 DIAGNOSIS — Z8669 Personal history of other diseases of the nervous system and sense organs: Secondary | ICD-10-CM | POA: Insufficient documentation

## 2014-03-29 DIAGNOSIS — I10 Essential (primary) hypertension: Secondary | ICD-10-CM | POA: Diagnosis not present

## 2014-03-29 DIAGNOSIS — M549 Dorsalgia, unspecified: Secondary | ICD-10-CM | POA: Diagnosis present

## 2014-03-29 DIAGNOSIS — I2510 Atherosclerotic heart disease of native coronary artery without angina pectoris: Secondary | ICD-10-CM | POA: Insufficient documentation

## 2014-03-29 DIAGNOSIS — Z8709 Personal history of other diseases of the respiratory system: Secondary | ICD-10-CM | POA: Diagnosis not present

## 2014-03-29 MED ORDER — OXYCODONE-ACETAMINOPHEN 5-325 MG PO TABS
1.0000 | ORAL_TABLET | Freq: Once | ORAL | Status: AC
  Administered 2014-03-29: 1 via ORAL
  Filled 2014-03-29: qty 1

## 2014-03-29 NOTE — ED Notes (Signed)
Pt c/o lower back pain for 2 months.  He is supposed to have a stimulator placed soon

## 2014-03-29 NOTE — ED Provider Notes (Signed)
CSN: [id number]     Arrival date & time 03/29/14  0532 History   First MD Initiated Contact with Patient 03/29/14 [phone]     Chief Complaint  Patient presents with  . Back Pain     (Consider location/radiation/quality/duration/timing/severity/associated sxs/prior Treatment) The history is provided by the patient. No language interpreter was used.  Ritchie Klee is a 58 year old male with past medical history of HIV, hypertension, chronic back pain with history of 4 back surgeries, coronary artery disease presenting to the ED with back pain has been ongoing for the past 3 months with worsening over the past 2 weeks. Patient reports that the discomfort is localized to lower portion of his back described as a sharp, shooting sensation that is constant without radiation. As per patient, reported his appointment on 04/02/2014 where a stimulator is going to be placed by a neurosurgeon. Patient with a recently has a scheduled appointment with a primary care provider-Dr. Orland Dec his first appointment is on 03/30/2014. Stated that he cannot see with pain-reported that the pain is become more intense. Denies fall, injuries, numbness, tingling, loss of sensation, urinary and bowel incontinence, nausea, vomiting, abdominal pain or chest pain, shortness of breath, difficulty breathing, dizziness, changes to pain pattern/symptoms. PCP Dr. Owens Shark  Past Medical History  Diagnosis Date  . HIV positive 2004  . Hypertension   . Meningitis   . Coronary artery disease   . Chest pain at rest 09/23/2012  . Chronic bronchitis     "q year last 5 yr or so" (09/24/2012)  . Exertional dyspnea   . Migraines   . Arthritis     "both shoulders" (09/24/2012)  . Chronic lower back pain   . Pneumonia   . Lumbar disc disease   . Iliac artery aneurysm     Right   Past Surgical History  Procedure Laterality Date  . Intussusception repair  10/2011  . Posterior lumbar fusion  1995  . Elbow surgery  ~ 1997    "removed some  stones; right" (09/24/2012)  . Appendectomy  2013  . Back surgery  2006    4 BACK SURGERIES  . Abdominal surgery    . Spine surgery      Injury to back 1995   Family History  Problem Relation Age of Onset  . Heart disease Father   . Heart disease Brother   . Diabetes Brother    History  Substance Use Topics  . Smoking status: Current Every Day Smoker -- 0.50 packs/day for 45 years    Types: Cigarettes    Last Attempt to Quit: 09/24/2012  . Smokeless tobacco: Never Used     Comment: 09/24/2012 "been stopped now ~ 8 month".  3 per day now.  . Alcohol Use: No    Review of Systems  Respiratory: Negative for chest tightness and shortness of breath.   Cardiovascular: Negative for chest pain and leg swelling.  Gastrointestinal: Negative for nausea, vomiting and diarrhea.  Musculoskeletal: Positive for back pain. Negative for neck pain.  Neurological: Negative for weakness and numbness.      Allergies  Bee venom; Flexeril; Hydrocodone; Nabumetone; [name]; Tramadol; Ace inhibitors; and Other  Home Medications   Prior to Admission medications   Medication Sig Start Date End Date Taking? Authorizing Provider  acetaminophen (TYLENOL) 500 MG tablet Take 500 mg by mouth every 6 (six) hours as needed for mild pain.   Yes Historical Provider, MD  EPINEPHrine (EPI-PEN) 0.3 mg/0.3 mL SOAJ Inject 0.3 mg into the  muscle once as needed (Anaphylaxis).  03/09/13  Yes Montine Circle, PA-C  nitroGLYCERIN (NITROSTAT) 0.4 MG SL tablet Place 0.4 mg under the tongue every 5 (five) minutes as needed for chest pain.  08/11/13  Yes Tonia Brooms, MD   BP 129/92  Pulse 80  Temp(Src) 97.7 F (36.5 C) (Oral)  Resp 16  Ht 5\' 8"  (1.727 m)  Wt 225 lb (102.059 kg)  BMI 34.22 kg/m2  SpO2 97% Physical Exam  Nursing note and vitals reviewed. Constitutional: He is oriented to person, place, and time. No distress.  Frail appearing elderly man  HENT:  Head: Normocephalic and atraumatic.  Mouth/Throat:  Oropharynx is clear and moist. No oropharyngeal exudate.  Eyes: Conjunctivae and EOM are normal. Pupils are equal, round, and reactive to light. Right eye exhibits no discharge. Left eye exhibits no discharge.  Neck: Normal range of motion. Neck supple. No tracheal deviation present.  Cardiovascular: Normal rate and regular rhythm.  Exam reveals no friction rub.   No murmur heard. Pulses:      Radial pulses are 2+ on the right side, and 2+ on the left side.       Dorsalis pedis pulses are 2+ on the right side, and 2+ on the left side.  Pulmonary/Chest: Effort normal and breath sounds normal. No respiratory distress. He has no wheezes. He has no rales.  Musculoskeletal: Normal range of motion.       Lumbar back: He exhibits tenderness and bony tenderness. He exhibits normal range of motion, no swelling, no edema, no deformity, no laceration and no pain.       Back:  Healed scar noted to the lumbosacral region, mid line. Negative deformities identified to the spine. Discomfort upon palpation to the mid lumbosacral and bilateral paravertebral regions. Full ROM to upper and lower extremities without difficulty noted, negative ataxia noted.  Lymphadenopathy:    He has no cervical adenopathy.  Neurological: He is alert and oriented to person, place, and time. No cranial nerve deficit. He exhibits normal muscle tone. Coordination normal.  Cranial nerves III-XII grossly intact Strength 5+/5+ to upper and lower extremities bilaterally with resistance applied, equal distribution noted Equal grip strength bilaterally Sensation intact with differentiation to sharp and dull touch Negative bilateral saddle paresthesias Negative facial drooping Negative slurred speech  Negative aphasia Gait proper-negative step-offs or sway. Negative arm drift Fine motor skills intact  Skin: Skin is warm and dry. No rash noted. He is not diaphoretic. No erythema.  Psychiatric: He has a normal mood and affect. His  behavior is normal. Thought content normal.    ED Course  Procedures (including critical care time)  9:24 AM Patient ambulated well. Negative steps off or sway noted.   Labs Review Labs Reviewed - No data to display  Imaging Review No results found.   EKG Interpretation None      MDM   Final diagnoses:  Chronic back pain    Medications  oxyCODONE-acetaminophen (PERCOCET/ROXICET) 5-325 MG per tablet 1 tablet (1 tablet Oral Given 03/29/14 0850)     Filed Vitals:   03/29/14 0542 03/29/14 0700 03/29/14 0800 03/29/14 0900  BP: 136/100 126/94 128/90 129/92  Pulse: 99 84 83 80  Temp: 97.7 F (36.5 C)     TempSrc: Oral     Resp: 28 16    Height:      Weight:      SpO2: 98% 95% 98% 97%    This provider reviewed patient's chart. Patient is been seen and  assessed in ED setting numerous times regarding chronic back pain. This would be the patient's 22nd visit in the past 6 months regarding back pain. Patient was last seen on 03/20/2014 where patient was discharged with Percocets. Patient was last seen on 03/26/2014 where he was discharged with [facility], prednisone, Robaxin. No emergent imaging negative time secondary to no new changes to symptoms, fall, injury. Negative focal neurological deficits noted. Strength intact with equal distribution. Sensation intact. Full range of motion to upper and lower tremors bilaterally. Patient ambulated well with negative findings - negative ataxia noted.  Doubt cauda equina. Doubt epidural abscess. Suspicion to be acute exacerbation of chronic back pain. Patient is scheduled to get stimulator placed in 04/02/2014 as well as has a primary care provider appointment on 03/30/2014. Pain medications administered in ED setting. Patient stable, afebrile. Patient is septic appearing. Discharged patient. Discussed with patient to keep appointments-discussed importance of following up with neurosurgery. Discussed with patient to avoid a physical shortness  activity. Discussed with patient to closely monitor symptoms and if symptoms are to worsen or change to report back to the ED - strict return instructions given.  Patient agreed to plan of care, understood, all questions answered.    Jamse Mead, PA-C 03/29/14 1715

## 2014-03-29 NOTE — ED Notes (Signed)
Pt ambulated well in hall. No problems.

## 2014-03-29 NOTE — Discharge Instructions (Signed)
Please call your doctor for a followup appointment within 24-48 hours. When you talk to your doctor please let them know that you were seen in the emergency department and have them acquire all of your records so that they can discuss the findings with you and formulate a treatment plan to fully care for your new and ongoing problems. Please keep appointment with Dr. Owens Shark on 03/30/2014 Please keep appointment with Dr. Maryjean Ka on 04/02/2014 regarding stimulator placement Please rest and stay hydrated Please avoid any physical or strenuous activity  Please continue to wear back brace at all times Please continue to monitor symptoms closely and if symptoms are to worsen or change (fever greater than 101, chills, sweating, nausea, vomiting, chest pain, shortness of breath, difficulty breathing, numbness, tingling, fall, injury, worsening or changes to pain, inability to control, urine or bowel movements) please report back to the ED immediately   Back Pain, Adult Low back pain is very common. About 1 in 5 people have back pain.The cause of low back pain is rarely dangerous. The pain often gets better over time.About half of people with a sudden onset of back pain feel better in just 2 weeks. About 8 in 10 people feel better by 6 weeks.  CAUSES Some common causes of back pain include:  Strain of the muscles or ligaments supporting the spine.  Wear and tear (degeneration) of the spinal discs.  Arthritis.  Direct injury to the back. DIAGNOSIS Most of the time, the direct cause of low back pain is not known.However, back pain can be treated effectively even when the exact cause of the pain is unknown.Answering your caregiver's questions about your overall health and symptoms is one of the most accurate ways to make sure the cause of your pain is not dangerous. If your caregiver needs more information, he or she may order lab work or imaging tests (X-rays or MRIs).However, even if imaging tests show  changes in your back, this usually does not require surgery. HOME CARE INSTRUCTIONS For many people, back pain returns.Since low back pain is rarely dangerous, it is often a condition that people can learn to [facility] their own.   Remain active. It is stressful on the back to sit or stand in one place. Do not sit, drive, or stand in one place for more than 30 minutes at a time. Take short walks on level surfaces as soon as pain allows.Try to increase the length of time you walk each day.  Do not stay in bed.Resting more than 1 or 2 days can delay your recovery.  Do not avoid exercise or work.Your body is made to move.It is not dangerous to be active, even though your back may hurt.Your back will likely heal faster if you return to being active before your pain is gone.  Pay attention to your body when you bend and lift. Many people have less discomfortwhen lifting if they bend their knees, keep the load close to their bodies,and avoid twisting. Often, the most comfortable positions are those that put less stress on your recovering back.  Find a comfortable position to sleep. Use a firm mattress and lie on your side with your knees slightly bent. If you lie on your back, put a pillow under your knees.  Only take over-the-counter or prescription medicines as directed by your caregiver. Over-the-counter medicines to reduce pain and inflammation are often the most helpful.Your caregiver may prescribe muscle relaxant drugs.These medicines help dull your pain so you can more quickly  return to your normal activities and healthy exercise.  Put ice on the injured area.  Put ice in a plastic bag.  Place a towel between your skin and the bag.  Leave the ice on for 15-20 minutes, 03-04 times a day for the first 2 to 3 days. After that, ice and heat may be alternated to reduce pain and spasms.  Ask your caregiver about trying back exercises and gentle massage. This may be of some  benefit.  Avoid feeling anxious or stressed.Stress increases muscle tension and can worsen back pain.It is important to recognize when you are anxious or stressed and learn ways to manage it.Exercise is a great option. SEEK MEDICAL CARE IF:  You have pain that is not relieved with rest or medicine.  You have pain that does not improve in 1 week.  You have new symptoms.  You are generally not feeling well. SEEK IMMEDIATE MEDICAL CARE IF:   You have pain that radiates from your back into your legs.  You develop new bowel or bladder control problems.  You have unusual weakness or numbness in your arms or legs.  You develop nausea or vomiting.  You develop abdominal pain.  You feel faint. Document Released: 09/04/2005 Document Revised: 03/05/2012 Document Reviewed: 01/23/2011 [facility] Patient Information 2015 [place], [place]. This information is not intended to replace advice given to you by your health care provider. Make sure you discuss any questions you have with your health care provider.

## 2014-03-30 NOTE — ED Provider Notes (Signed)
Medical screening examination/treatment/procedure(s) were performed by non-physician practitioner and as supervising physician I was immediately available for consultation/collaboration.   EKG Interpretation None        Malvin Johns, MD 03/30/14 2021

## 2014-03-31 ENCOUNTER — Emergency Department (HOSPITAL_COMMUNITY)
Admission: EM | Admit: 2014-03-31 | Discharge: 2014-03-31 | Disposition: A | Discharge status: Home / Self Care | Payer: Medicare Other | Attending: Emergency Medicine | Admitting: Emergency Medicine

## 2014-03-31 ENCOUNTER — Encounter (HOSPITAL_COMMUNITY): Payer: Self-pay | Admitting: Emergency Medicine

## 2014-03-31 DIAGNOSIS — Z8739 Personal history of other diseases of the musculoskeletal system and connective tissue: Secondary | ICD-10-CM | POA: Diagnosis not present

## 2014-03-31 DIAGNOSIS — M545 Low back pain, unspecified: Secondary | ICD-10-CM | POA: Diagnosis present

## 2014-03-31 DIAGNOSIS — Z8701 Personal history of pneumonia (recurrent): Secondary | ICD-10-CM | POA: Insufficient documentation

## 2014-03-31 DIAGNOSIS — I10 Essential (primary) hypertension: Secondary | ICD-10-CM | POA: Insufficient documentation

## 2014-03-31 DIAGNOSIS — M549 Dorsalgia, unspecified: Secondary | ICD-10-CM

## 2014-03-31 DIAGNOSIS — G8929 Other chronic pain: Secondary | ICD-10-CM | POA: Diagnosis not present

## 2014-03-31 DIAGNOSIS — I2510 Atherosclerotic heart disease of native coronary artery without angina pectoris: Secondary | ICD-10-CM | POA: Diagnosis not present

## 2014-03-31 DIAGNOSIS — Z21 Asymptomatic human immunodeficiency virus [HIV] infection status: Secondary | ICD-10-CM | POA: Diagnosis not present

## 2014-03-31 DIAGNOSIS — Z79899 Other long term (current) drug therapy: Secondary | ICD-10-CM | POA: Diagnosis not present

## 2014-03-31 DIAGNOSIS — F17200 Nicotine dependence, unspecified, uncomplicated: Secondary | ICD-10-CM | POA: Insufficient documentation

## 2014-03-31 MED ORDER — PROMETHAZINE HCL 25 MG/ML IJ SOLN
25.0000 mg | Freq: Once | INTRAMUSCULAR | Status: AC
  Administered 2014-03-31: 25 mg via INTRAMUSCULAR
  Filled 2014-03-31: qty 1

## 2014-03-31 MED ORDER — OXYCODONE-ACETAMINOPHEN 5-325 MG PO TABS
2.0000 | ORAL_TABLET | Freq: Once | ORAL | Status: AC
  Administered 2014-03-31: 2 via ORAL
  Filled 2014-03-31: qty 2

## 2014-03-31 NOTE — ED Notes (Signed)
Patient c/o low back pain and vomiting since taking carisoprodol 350 mg tid x 2 days. Patient states he had a prescription for zofran and that is not working for him.

## 2014-03-31 NOTE — Discharge Instructions (Signed)

## 2014-03-31 NOTE — ED Provider Notes (Signed)
CSN: [id number]     Arrival date & time 03/31/14  1134 History   First MD Initiated Contact with Patient 03/31/14 1215     Chief Complaint  Patient presents with  . Back Pain  . Emesis     (Consider location/radiation/quality/duration/timing/severity/associated sxs/prior Treatment) Patient is a 58 y.o. male presenting with back pain and vomiting. The history is provided by the patient.  Back Pain Emesis  patient here complaining of lower back pain x3 days. Patient has a history of chronic back pain and is scheduled to see a neurosurgeon for a muscle stim other implant in 2 days. He was seen here 2 days ago for similar symptoms and given soma which is causing nausea. He denies any red flags for a cauda equina. He exhibits annular pain. Pain characterized as sharp localized to his lower lumbar spine and worse with movement. Better with rest. No bowel or bladder dysfunction. Denies any rashes to his back.  Past Medical History  Diagnosis Date  . HIV positive 2004  . Hypertension   . Meningitis   . Coronary artery disease   . Chest pain at rest 09/23/2012  . Chronic bronchitis     "q year last 5 yr or so" (09/24/2012)  . Exertional dyspnea   . Migraines   . Arthritis     "both shoulders" (09/24/2012)  . Chronic lower back pain   . Pneumonia   . Lumbar disc disease   . Iliac artery aneurysm     Right   Past Surgical History  Procedure Laterality Date  . Intussusception repair  10/2011  . Posterior lumbar fusion  1995  . Elbow surgery  ~ 1997    "removed some stones; right" (09/24/2012)  . Appendectomy  2013  . Back surgery  2006    4 BACK SURGERIES  . Abdominal surgery    . Spine surgery      Injury to back 1995   Family History  Problem Relation Age of Onset  . Heart disease Father   . Heart disease Brother   . Diabetes Brother    History  Substance Use Topics  . Smoking status: Current Every Day Smoker -- 0.50 packs/day for 45 years    Types: Cigarettes    Last Attempt  to Quit: 09/24/2012  . Smokeless tobacco: Never Used     Comment: 09/24/2012 "been stopped now ~ 8 month".  3 per day now.  . Alcohol Use: No    Review of Systems  Gastrointestinal: Positive for vomiting.  Musculoskeletal: Positive for back pain.  All other systems reviewed and are negative.     Allergies  Bee venom; Flexeril; Hydrocodone; Nabumetone; [name]; Tramadol; Ace inhibitors; and Other  Home Medications   Prior to Admission medications   Medication Sig Start Date End Date Taking? Authorizing Provider  acetaminophen (TYLENOL) 500 MG tablet Take 500 mg by mouth every 6 (six) hours as needed for mild pain.   Yes Historical Provider, MD  amLODipine (NORVASC) 5 MG tablet Take 5 mg by mouth daily.   Yes Historical Provider, MD  carisoprodol (SOMA) 350 MG tablet Take 350 mg by mouth 4 (four) times daily as needed for muscle spasms.   Yes Historical Provider, MD  diazepam (VALIUM) 5 MG tablet Take 5 mg by mouth every 6 (six) hours as needed for anxiety.   Yes Historical Provider, MD  EPINEPHrine (EPI-PEN) 0.3 mg/0.3 mL SOAJ Inject 0.3 mg into the muscle once as needed (Anaphylaxis).  03/09/13  Yes  Montine Circle, PA-C  nitroGLYCERIN (NITROSTAT) 0.4 MG SL tablet Place 0.4 mg under the tongue every 5 (five) minutes as needed for chest pain.  08/11/13  Yes Tonia Brooms, MD   BP 118/100  Pulse 108  Temp(Src) 97.8 F (36.6 C) (Oral)  Resp 20  Ht 5\' 8"  (1.727 m)  Wt 157 lb (71.215 kg)  BMI 23.88 kg/m2  SpO2 100% Physical Exam  Nursing note and vitals reviewed. Constitutional: He is oriented to person, place, and time. He appears well-developed and well-nourished.  Non-toxic appearance. No distress.  HENT:  Head: Normocephalic and atraumatic.  Eyes: Conjunctivae, EOM and lids are normal. Pupils are equal, round, and reactive to light.  Neck: Normal range of motion. Neck supple. No tracheal deviation present. No mass present.  Cardiovascular: Normal rate, regular rhythm and  normal heart sounds.  Exam reveals no gallop.   No murmur heard. Pulmonary/Chest: Effort normal and breath sounds normal. No stridor. No respiratory distress. He has no decreased breath sounds. He has no wheezes. He has no rhonchi. He has no rales.  Abdominal: Soft. Normal appearance and bowel sounds are normal. He exhibits no distension. There is no tenderness. There is no rebound and no CVA tenderness.  Musculoskeletal: Normal range of motion. He exhibits no edema and no tenderness.       Back:  Neurological: He is alert and oriented to person, place, and time. He has normal strength. No cranial nerve deficit or sensory deficit. Coordination and gait normal. GCS eye subscore is 4. GCS verbal subscore is 5. GCS motor subscore is 6.  Skin: Skin is warm and dry. No abrasion and no rash noted.  Psychiatric: He has a normal mood and affect. His speech is normal and behavior is normal.    ED Course  Procedures (including critical care time) Labs Review Labs Reviewed - No data to display  Imaging Review No results found.   EKG Interpretation None      MDM   Final diagnoses:  None    Patient given injection of promethazine here as well as 2 Percocet by mouth. He is encouraged to followup with his doctor. He is able to ambulate. No acute neurological deficits noted.    Leota Jacobsen, MD 03/31/14 [id number]

## 2014-04-02 ENCOUNTER — Other Ambulatory Visit: Payer: Self-pay | Admitting: Anesthesiology

## 2014-04-02 ENCOUNTER — Emergency Department (HOSPITAL_COMMUNITY)
Admission: EM | Admit: 2014-04-02 | Discharge: 2014-04-02 | Disposition: A | Discharge status: Home / Self Care | Payer: Medicare Other | Attending: Emergency Medicine | Admitting: Emergency Medicine

## 2014-04-02 ENCOUNTER — Inpatient Hospital Stay (HOSPITAL_COMMUNITY): Admission: RE | Admit: 2014-04-02 | Payer: Medicare Other | Source: Ambulatory Visit

## 2014-04-02 ENCOUNTER — Encounter (HOSPITAL_COMMUNITY): Payer: Self-pay | Admitting: Emergency Medicine

## 2014-04-02 DIAGNOSIS — F17200 Nicotine dependence, unspecified, uncomplicated: Secondary | ICD-10-CM | POA: Diagnosis not present

## 2014-04-02 DIAGNOSIS — Z8701 Personal history of pneumonia (recurrent): Secondary | ICD-10-CM | POA: Diagnosis not present

## 2014-04-02 DIAGNOSIS — Z9889 Other specified postprocedural states: Secondary | ICD-10-CM | POA: Diagnosis not present

## 2014-04-02 DIAGNOSIS — I10 Essential (primary) hypertension: Secondary | ICD-10-CM | POA: Insufficient documentation

## 2014-04-02 DIAGNOSIS — Z8709 Personal history of other diseases of the respiratory system: Secondary | ICD-10-CM | POA: Insufficient documentation

## 2014-04-02 DIAGNOSIS — M129 Arthropathy, unspecified: Secondary | ICD-10-CM | POA: Diagnosis not present

## 2014-04-02 DIAGNOSIS — I2510 Atherosclerotic heart disease of native coronary artery without angina pectoris: Secondary | ICD-10-CM | POA: Diagnosis not present

## 2014-04-02 DIAGNOSIS — Z8661 Personal history of infections of the central nervous system: Secondary | ICD-10-CM | POA: Insufficient documentation

## 2014-04-02 DIAGNOSIS — G8929 Other chronic pain: Secondary | ICD-10-CM | POA: Diagnosis not present

## 2014-04-02 DIAGNOSIS — M545 Low back pain, unspecified: Secondary | ICD-10-CM | POA: Insufficient documentation

## 2014-04-02 DIAGNOSIS — Z79899 Other long term (current) drug therapy: Secondary | ICD-10-CM | POA: Insufficient documentation

## 2014-04-02 DIAGNOSIS — Z21 Asymptomatic human immunodeficiency virus [HIV] infection status: Secondary | ICD-10-CM | POA: Insufficient documentation

## 2014-04-02 DIAGNOSIS — G43909 Migraine, unspecified, not intractable, without status migrainosus: Secondary | ICD-10-CM | POA: Diagnosis not present

## 2014-04-02 MED ORDER — OXYCODONE-ACETAMINOPHEN 5-325 MG PO TABS
1.0000 | ORAL_TABLET | Freq: Once | ORAL | Status: AC
  Administered 2014-04-02: 1 via ORAL
  Filled 2014-04-02: qty 1

## 2014-04-02 NOTE — ED Provider Notes (Signed)
CSN: [id number]     Arrival date & time 04/02/14  1100 History  This chart was scribed for Starlyn Skeans, PA-C, non-physician practitioner working with Virgel Manifold, MD by Vernell Barrier, ED scribe. This patient was seen in room [place] and the patient's care was started at 12:31 PM.    Chief Complaint  Patient presents with  . Back Pain   Patient is a 58 y.o. male presenting with back pain. The history is provided by the patient. No language interpreter was used.  Back Pain Location:  Lumbar spine Quality:  Shooting Pain severity:  Severe Pain is:  Same all the time Onset quality:  Gradual Timing:  Constant Progression:  Worsening Chronicity:  Chronic Relieved by:  Nothing Worsened by:  Movement, standing, ambulation, bending, touching and sitting Ineffective treatments:  OTC medications and muscle relaxants Associated symptoms: no bladder incontinence, no bowel incontinence, no fever, no numbness, no perianal numbness and no tingling   Risk factors: recent surgery    HPI Comments: Mikail Goostree is a 58 y.o. male w/ hx of chronic back pain, HTN, and HIV presents to the Emergency Department complaining of ongoing lumbar back pain. Pain rating 10/10. Able to ambulate. Pain mildly radiating to left hip; states he feels like the bones in his left hip are rubbing together and increasing pain. Taking 3 Tylenol extra strength daily for pain with no relief. No other OTC medications used. Has used Prednisone in the past with no relief. Has been referred to an orthopedist but states he has not followed up. Went to PCP 1 day prior; was told there was nothing that could be done for him in regards to pain medication. PCP stated she would try to get him into pain clinics but was unsure how long that would take. Has had 4 prior fusion back surgeries; last surgery completed in [place], [place]. No screws placed to date. Allergies to ibuprofen. Unsure of CD4; last viral load he states was good. On  Stribild to treat; compliant with medication. Admits to smoking up to 9 cigarettes daily. Denies any illicit drug usage. Denies bowel/bladder dysfunction, numbness/tingling in groin, fever, chills, nausea, vomiting,   PCP: Dr. Cletis [place]  Past Medical History  Diagnosis Date  . HIV positive 2004  . Hypertension   . Meningitis   . Coronary artery disease   . Chest pain at rest 09/23/2012  . Chronic bronchitis     "q year last 5 yr or so" (09/24/2012)  . Exertional dyspnea   . Migraines   . Arthritis     "both shoulders" (09/24/2012)  . Chronic lower back pain   . Pneumonia   . Lumbar disc disease   . Iliac artery aneurysm     Right   Past Surgical History  Procedure Laterality Date  . Intussusception repair  10/2011  . Posterior lumbar fusion  1995  . Elbow surgery  ~ 1997    "removed some stones; right" (09/24/2012)  . Appendectomy  2013  . Back surgery  2006    4 BACK SURGERIES  . Abdominal surgery    . Spine surgery      Injury to back 1995   Family History  Problem Relation Age of Onset  . Heart disease Father   . Heart disease Brother   . Diabetes Brother    History  Substance Use Topics  . Smoking status: Current Every Day Smoker -- 0.50 packs/day for 45 years    Types: Cigarettes    Last  Attempt to Quit: 09/24/2012  . Smokeless tobacco: Never Used     Comment: 09/24/2012 "been stopped now ~ 8 month".  3 per day now.  . Alcohol Use: No    Review of Systems  Constitutional: Negative for fever and chills.  Gastrointestinal: Negative for nausea, vomiting and bowel incontinence.  Genitourinary: Negative for bladder incontinence.  Musculoskeletal: Positive for back pain.  Neurological: Negative for tingling and numbness.  All other systems reviewed and are negative.  Allergies  Bee venom; Flexeril; Hydrocodone; Nabumetone; [name]; Tramadol; Ace inhibitors; and Other  Home Medications   Prior to Admission medications   Medication Sig Start Date End Date  Taking? Authorizing Provider  acetaminophen (TYLENOL) 500 MG tablet Take 500 mg by mouth every 6 (six) hours as needed for mild pain.    Historical Provider, MD  amLODipine (NORVASC) 5 MG tablet Take 5 mg by mouth daily.    Historical Provider, MD  carisoprodol (SOMA) 350 MG tablet Take 350 mg by mouth 4 (four) times daily as needed for muscle spasms.    Historical Provider, MD  diazepam (VALIUM) 5 MG tablet Take 5 mg by mouth every 6 (six) hours as needed for anxiety.    Historical Provider, MD  EPINEPHrine (EPI-PEN) 0.3 mg/0.3 mL SOAJ Inject 0.3 mg into the muscle once as needed (Anaphylaxis).  03/09/13   Montine Circle, PA-C  nitroGLYCERIN (NITROSTAT) 0.4 MG SL tablet Place 0.4 mg under the tongue every 5 (five) minutes as needed for chest pain.  08/11/13   Tonia Brooms, MD   Triage vitals: BP 132/96  Pulse 88  Temp(Src) 97.8 F (36.6 C) (Oral)  Resp 18  SpO2 100%  Physical Exam  Nursing note and vitals reviewed. Constitutional: He is oriented to person, place, and time. He appears well-developed and well-nourished. No distress.  HENT:  Head: Normocephalic and atraumatic.  Eyes: Conjunctivae and EOM are normal.  Neck: Neck supple. No tracheal deviation present.  Cardiovascular: Normal rate, regular rhythm and normal heart sounds.  Exam reveals no gallop and no friction rub.   No murmur heard. Pulmonary/Chest: Effort normal and breath sounds normal. No respiratory distress. He has no wheezes. He has no rales.  Musculoskeletal: Normal range of motion.       Lumbar back: He exhibits tenderness, bony tenderness and pain. He exhibits normal range of motion, no swelling and no deformity.  Patient rises slowly from sitting to standing.  They walk without an antalgic gait.  There is no evidence of erythema, ecchymosis, or gross deformity.  There is tenderness to palpation over lumbar spine and bilateral paraspinal muscles.  Active ROM full.  Sensation to light touch is intact over all  extremities.  Strength is symmetric and equal in all extremities.  DTRs are equal and symmetric in all extremities.       Neurological: He is alert and oriented to person, place, and time. No cranial nerve deficit or sensory deficit.  Reflex Scores:      Patellar reflexes are 2+ on the right side and 2+ on the left side.      Achilles reflexes are 2+ on the right side and 2+ on the left side. Skin: Skin is warm and dry.  Psychiatric: He has a normal mood and affect. His behavior is normal. Judgment and thought content normal.    ED Course  Procedures (including critical care time) DIAGNOSTIC STUDIES: Oxygen Saturation is 100% on room air, normal by my interpretation.    COORDINATION OF CARE: At 12:43  PM: Discussed treatment plan with patient which includes in ED treatment and referral to pain management and orthopedics. Patient agrees.   Labs Review Labs Reviewed - No data to display  Imaging Review No results found.   EKG Interpretation None      MDM   Final diagnoses:  Chronic low back pain   Patient is a 58 y.o. Male who presents to the ED with chronic low back pain.  Patient has been seen here over 15 times for low back pain since May.  Patient is not a candidate for narcotic prescription to take home at this time.  Back exam is normal and there are no signs of cauda equina at this time.  Patient denies any use of IV drugs in his lifetime.  Patient was seen by his PCP but she will not do chronic pain management at this time.  Patient will be given oxycodone here and discharged at this time.  Patient will be referred to [place] follow-up and will be given a referral to pain clinic at this time.  Patient was told to return for cauda equina symptoms.     I personally performed the services described in this documentation, which was scribed in my presence. The recorded information has been reviewed and is accurate.     Cherylann Parr, PA-C 04/02/14 1254

## 2014-04-02 NOTE — ED Notes (Signed)
Per pt, chronic back pain-has not seen MD-comes to different hospitals for pain meds

## 2014-04-02 NOTE — Discharge Instructions (Signed)

## 2014-04-02 NOTE — ED Provider Notes (Signed)
Medical screening examination/treatment/procedure(s) were performed by non-physician practitioner and as supervising physician I was immediately available for consultation/collaboration.   EKG Interpretation None       Virgel Manifold, MD 04/02/14 [phone]

## 2014-04-04 ENCOUNTER — Emergency Department (HOSPITAL_COMMUNITY)
Admission: EM | Admit: 2014-04-04 | Discharge: 2014-04-04 | Disposition: A | Discharge status: Home / Self Care | Payer: Medicare Other | Attending: Emergency Medicine | Admitting: Emergency Medicine

## 2014-04-04 ENCOUNTER — Encounter (HOSPITAL_COMMUNITY): Payer: Self-pay | Admitting: Emergency Medicine

## 2014-04-04 DIAGNOSIS — I723 Aneurysm of iliac artery: Secondary | ICD-10-CM | POA: Diagnosis not present

## 2014-04-04 DIAGNOSIS — Z8701 Personal history of pneumonia (recurrent): Secondary | ICD-10-CM | POA: Insufficient documentation

## 2014-04-04 DIAGNOSIS — Z79899 Other long term (current) drug therapy: Secondary | ICD-10-CM | POA: Diagnosis not present

## 2014-04-04 DIAGNOSIS — G43909 Migraine, unspecified, not intractable, without status migrainosus: Secondary | ICD-10-CM | POA: Insufficient documentation

## 2014-04-04 DIAGNOSIS — M51379 Other intervertebral disc degeneration, lumbosacral region without mention of lumbar back pain or lower extremity pain: Secondary | ICD-10-CM | POA: Insufficient documentation

## 2014-04-04 DIAGNOSIS — M5430 Sciatica, unspecified side: Secondary | ICD-10-CM | POA: Insufficient documentation

## 2014-04-04 DIAGNOSIS — Z8739 Personal history of other diseases of the musculoskeletal system and connective tissue: Secondary | ICD-10-CM | POA: Insufficient documentation

## 2014-04-04 DIAGNOSIS — I2510 Atherosclerotic heart disease of native coronary artery without angina pectoris: Secondary | ICD-10-CM | POA: Diagnosis not present

## 2014-04-04 DIAGNOSIS — F17200 Nicotine dependence, unspecified, uncomplicated: Secondary | ICD-10-CM | POA: Diagnosis not present

## 2014-04-04 DIAGNOSIS — J411 Mucopurulent chronic bronchitis: Secondary | ICD-10-CM | POA: Diagnosis not present

## 2014-04-04 DIAGNOSIS — G8928 Other chronic postprocedural pain: Secondary | ICD-10-CM | POA: Diagnosis not present

## 2014-04-04 DIAGNOSIS — M545 Low back pain: Secondary | ICD-10-CM

## 2014-04-04 DIAGNOSIS — Z21 Asymptomatic human immunodeficiency virus [HIV] infection status: Secondary | ICD-10-CM | POA: Diagnosis not present

## 2014-04-04 DIAGNOSIS — Z8669 Personal history of other diseases of the nervous system and sense organs: Secondary | ICD-10-CM | POA: Insufficient documentation

## 2014-04-04 DIAGNOSIS — M5137 Other intervertebral disc degeneration, lumbosacral region: Secondary | ICD-10-CM | POA: Insufficient documentation

## 2014-04-04 DIAGNOSIS — I10 Essential (primary) hypertension: Secondary | ICD-10-CM | POA: Insufficient documentation

## 2014-04-04 DIAGNOSIS — M549 Dorsalgia, unspecified: Secondary | ICD-10-CM | POA: Diagnosis present

## 2014-04-04 MED ORDER — KETOROLAC TROMETHAMINE 30 MG/ML IJ SOLN
30.0000 mg | Freq: Once | INTRAMUSCULAR | Status: AC
  Administered 2014-04-04: 30 mg via INTRAMUSCULAR
  Filled 2014-04-04: qty 1

## 2014-04-04 MED ORDER — ACETAMINOPHEN 325 MG PO TABS: 650.0000 mg | ORAL_TABLET | Freq: Four times a day (QID) | ORAL | Status: DC | PRN

## 2014-04-04 NOTE — ED Notes (Signed)
C/o chronic back pain, relates to past injury in 1995. Pain ongoing for 5-6 months. Denies sx other than pain. No meds PTA.

## 2014-04-04 NOTE — ED Provider Notes (Signed)
CSN: [id number]     Arrival date & time 04/04/14  1953 History  This chart was scribed for non-physician practitioner working with Babette Relic, MD by Mercy Moore, ED Scribe. This patient was seen in room TR10C/TR10C and the patient's care was started at 9:30 PM.   Chief Complaint  Patient presents with  . Back Pain     HPI Comments: Kenneth [name] is a 58 y.o. male who presents to the Emergency Department complaining of back pain, ongoing for 5-6 months. Patient presents wearing a back brace which provides support.  Patient reports going for a walk this morning and after taking nap in a recliner patient reports increased back pain. Treatment with Tylenol, without relief. Patient denies recent injury and reports that his current back pain is similar to his chronic pain. PCP: Dr. Owens Shark Previous back surgery performed by Dr. Ladona Mow.     The history is provided by the patient. No language interpreter was used.    Past Medical History  Diagnosis Date  . HIV positive 2004  . Hypertension   . Meningitis   . Coronary artery disease   . Chest pain at rest 09/23/2012  . Chronic bronchitis     "q year last 5 yr or so" (09/24/2012)  . Exertional dyspnea   . Migraines   . Arthritis     "both shoulders" (09/24/2012)  . Chronic lower back pain   . Pneumonia   . Lumbar disc disease   . Iliac artery aneurysm     Right   Past Surgical History  Procedure Laterality Date  . Intussusception repair  10/2011  . Posterior lumbar fusion  1995  . Elbow surgery  ~ 1997    "removed some stones; right" (09/24/2012)  . Appendectomy  2013  . Back surgery  2006    4 BACK SURGERIES  . Abdominal surgery    . Spine surgery      Injury to back 1995   Family History  Problem Relation Age of Onset  . Heart disease Father   . Heart disease Brother   . Diabetes Brother    History  Substance Use Topics  . Smoking status: Current Every Day Smoker -- 0.50 packs/day for 45 years    Types: Cigarettes    Last  Attempt to Quit: 09/24/2012  . Smokeless tobacco: Never Used     Comment: 09/24/2012 "been stopped now ~ 8 month".  3 per day now.  . Alcohol Use: No    Review of Systems  Constitutional: Negative for fever and chills.  Gastrointestinal: Negative for nausea, vomiting and diarrhea.  Genitourinary: Negative for dysuria, urgency, enuresis and difficulty urinating.  Musculoskeletal: Positive for back pain.  Neurological: Negative for weakness and numbness.      Allergies  Bee venom; Flexeril; Hydrocodone; Ibuprofen; Nabumetone; [name]; Tramadol; Ace inhibitors; and Other  Home Medications   Prior to Admission medications   Medication Sig Start Date End Date Taking? Authorizing Provider  amLODipine (NORVASC) 5 MG tablet Take 5 mg by mouth daily.   Yes Historical Provider, MD  carisoprodol (SOMA) 350 MG tablet Take 350 mg by mouth 4 (four) times daily as needed for muscle spasms.   Yes Historical Provider, MD  EPINEPHrine (EPI-PEN) 0.3 mg/0.3 mL SOAJ Inject 0.3 mg into the muscle once as needed (Anaphylaxis).  03/09/13   Montine Circle, PA-C  nitroGLYCERIN (NITROSTAT) 0.4 MG SL tablet Place 0.4 mg under the tongue every 5 (five) minutes as needed for chest pain.  08/11/13   Tonia Brooms, MD   Triage Vitals: BP 122/94  Pulse 98  Temp(Src) 98.4 F (36.9 C) (Oral)  Resp 16  SpO2 96% Physical Exam  Nursing note and vitals reviewed. Constitutional: He is oriented to person, place, and time. He appears well-developed and well-nourished.  Non-toxic appearance. He does not have a sickly appearance. He does not appear ill. No distress.  HENT:  Head: Normocephalic and atraumatic.  Eyes: EOM are normal.  Neck: Neck supple.  Pulmonary/Chest: Effort normal. No respiratory distress.  Abdominal: Soft.  Musculoskeletal: Normal range of motion. He exhibits no edema and no tenderness.  No midline C-spine, T-spine, or L-spine tenderness with no step-offs, crepitus, or deformities noted. Large  midline scar L-spine , nontender to palpation no signs of infection. Normal and equal sensation and strength to bilateral lower extremities. Able to ambulate without assistance in the ED.  Neurological: He is alert and oriented to person, place, and time. No cranial nerve deficit or sensory deficit. He exhibits normal muscle tone.  Skin: Skin is warm and dry. He is not diaphoretic.  Psychiatric: He has a normal mood and affect. His behavior is normal.    ED Course  Procedures (including critical care time) COORDINATION OF CARE: 9:34 PM- Discussed treatment plan with patient at bedside and patient agreed to plan.      MDM   Final diagnoses:  Low back pain, unspecified back pain laterality, with sciatica presence unspecified   Patient with long-standing back pain, 24 visits to this emergency apartment in the past 6 months. Afebrile no signs or symptoms of cauda equina or abscess. Patient has a history of substance abuse and discussed that would not be prescribing him narcotics at this time. Will give Toradol and have the patient followup with his spine surgeon in [place]. Discussed and treatment plan with the patient. Return precautions given. Reports understanding and no other concerns at this time.  Patient is stable for discharge at this time.  Meds given in ED:  Medications  ketorolac (TORADOL) 30 MG/ML injection 30 mg (30 mg Intramuscular Given 04/04/14 2146)    Discharge Medication List as of 04/04/2014  9:46 PM    START taking these medications   Details  acetaminophen (TYLENOL) 325 MG tablet Take 2 tablets (650 mg total) by mouth every 6 (six) hours as needed., Starting 04/04/2014, Until Discontinued, Print         I personally performed the services described in this documentation, which was scribed in my presence. The recorded information has been reviewed and is accurate.    Lorrine Kin, PA-C 04/06/14 [phone]

## 2014-04-04 NOTE — Discharge Instructions (Signed)
Call for a follow up appointment with a Family or Primary Care Provider.  Call the doctor in [place], who previously operated on your back for a follow up appointment. Return if Symptoms worsen.   Take medication as prescribed.  You can continue taking tylenol as previously prescribed. Ice your back 3-4 times a day

## 2014-04-11 NOTE — ED Provider Notes (Signed)
Medical screening examination/treatment/procedure(s) were performed by non-physician practitioner and as supervising physician I was immediately available for consultation/collaboration.   EKG Interpretation None       Babette Relic, MD 04/11/14 [phone]

## 2014-05-08 ENCOUNTER — Ambulatory Visit (HOSPITAL_COMMUNITY): Admission: RE | Admit: 2014-05-08 | Payer: Medicare Other | Source: Ambulatory Visit | Admitting: Anesthesiology

## 2014-05-08 ENCOUNTER — Encounter (HOSPITAL_COMMUNITY): Admission: RE | Payer: Self-pay | Source: Ambulatory Visit

## 2014-05-08 SURGERY — INSERTION, SPINAL CORD STIMULATOR, LUMBAR
Anesthesia: Monitor Anesthesia Care

## 2014-06-09 NOTE — Addendum Note (Signed)
Addended by: Lorne Skeens D on: 06/09/2014 05:02 PM   Modules accepted: Orders

## 2014-06-18 ENCOUNTER — Ambulatory Visit: Payer: Medicare Other | Admitting: Internal Medicine

## 2014-07-06 ENCOUNTER — Telehealth: Payer: Self-pay | Admitting: *Deleted

## 2014-07-06 ENCOUNTER — Ambulatory Visit: Payer: Medicare Other | Admitting: Internal Medicine

## 2014-07-06 NOTE — Telephone Encounter (Signed)
Left message requesting pt call for another appt.

## 2014-08-10 ENCOUNTER — Other Ambulatory Visit: Payer: Self-pay | Admitting: Internal Medicine

## 2014-10-01 ENCOUNTER — Encounter (HOSPITAL_COMMUNITY): Payer: Self-pay | Admitting: Anesthesiology

## 2014-10-28 ENCOUNTER — Other Ambulatory Visit: Payer: Self-pay | Admitting: Infectious Diseases

## 2014-10-28 DIAGNOSIS — B20 Human immunodeficiency virus [HIV] disease: Secondary | ICD-10-CM

## 2015-03-22 ENCOUNTER — Encounter (HOSPITAL_COMMUNITY): Payer: Self-pay | Admitting: Emergency Medicine

## 2015-03-22 ENCOUNTER — Emergency Department (HOSPITAL_COMMUNITY)
Admission: EM | Admit: 2015-03-22 | Discharge: 2015-03-22 | Disposition: A | Discharge status: Home / Self Care | Payer: Medicare Other | Attending: Emergency Medicine | Admitting: Emergency Medicine

## 2015-03-22 ENCOUNTER — Emergency Department (HOSPITAL_COMMUNITY): Payer: Medicare Other

## 2015-03-22 DIAGNOSIS — M1990 Unspecified osteoarthritis, unspecified site: Secondary | ICD-10-CM | POA: Insufficient documentation

## 2015-03-22 DIAGNOSIS — Z9889 Other specified postprocedural states: Secondary | ICD-10-CM | POA: Diagnosis not present

## 2015-03-22 DIAGNOSIS — Z79899 Other long term (current) drug therapy: Secondary | ICD-10-CM | POA: Diagnosis not present

## 2015-03-22 DIAGNOSIS — G8929 Other chronic pain: Secondary | ICD-10-CM | POA: Diagnosis not present

## 2015-03-22 DIAGNOSIS — Y9389 Activity, other specified: Secondary | ICD-10-CM | POA: Insufficient documentation

## 2015-03-22 DIAGNOSIS — X58XXXA Exposure to other specified factors, initial encounter: Secondary | ICD-10-CM | POA: Insufficient documentation

## 2015-03-22 DIAGNOSIS — S3992XA Unspecified injury of lower back, initial encounter: Secondary | ICD-10-CM | POA: Diagnosis present

## 2015-03-22 DIAGNOSIS — Z8709 Personal history of other diseases of the respiratory system: Secondary | ICD-10-CM | POA: Diagnosis not present

## 2015-03-22 DIAGNOSIS — Y999 Unspecified external cause status: Secondary | ICD-10-CM | POA: Insufficient documentation

## 2015-03-22 DIAGNOSIS — Z8701 Personal history of pneumonia (recurrent): Secondary | ICD-10-CM | POA: Insufficient documentation

## 2015-03-22 DIAGNOSIS — Z21 Asymptomatic human immunodeficiency virus [HIV] infection status: Secondary | ICD-10-CM | POA: Diagnosis not present

## 2015-03-22 DIAGNOSIS — Y929 Unspecified place or not applicable: Secondary | ICD-10-CM | POA: Insufficient documentation

## 2015-03-22 DIAGNOSIS — I2510 Atherosclerotic heart disease of native coronary artery without angina pectoris: Secondary | ICD-10-CM | POA: Diagnosis not present

## 2015-03-22 DIAGNOSIS — S39012A Strain of muscle, fascia and tendon of lower back, initial encounter: Secondary | ICD-10-CM | POA: Diagnosis not present

## 2015-03-22 DIAGNOSIS — Z720 Tobacco use: Secondary | ICD-10-CM | POA: Diagnosis not present

## 2015-03-22 DIAGNOSIS — I10 Essential (primary) hypertension: Secondary | ICD-10-CM | POA: Insufficient documentation

## 2015-03-22 MED ORDER — METHOCARBAMOL 500 MG PO TABS
500.0000 mg | ORAL_TABLET | Freq: Once | ORAL | Status: AC
  Administered 2015-03-22: 500 mg via ORAL
  Filled 2015-03-22: qty 1

## 2015-03-22 MED ORDER — HYDROMORPHONE HCL 1 MG/ML IJ SOLN
1.0000 mg | Freq: Once | INTRAMUSCULAR | Status: AC
  Administered 2015-03-22: 1 mg via INTRAMUSCULAR
  Filled 2015-03-22: qty 1

## 2015-03-22 MED ORDER — METHOCARBAMOL 500 MG PO TABS: 500.0000 mg | ORAL_TABLET | Freq: Two times a day (BID) | ORAL | Status: DC

## 2015-03-22 MED ORDER — OXYCODONE-ACETAMINOPHEN 5-325 MG PO TABS: 1.0000 | ORAL_TABLET | ORAL | Status: DC | PRN

## 2015-03-22 NOTE — ED Notes (Signed)
Patient in with complaints of back pain that started today, states that he was moving furniture and thinks he may have a pulled muscle. Pain 10/10 lower back.

## 2015-03-22 NOTE — ED Provider Notes (Signed)
CSN: [id number]     Arrival date & time 03/22/15  1403 History  This chart was scribed for non-physician practitioner, Jeannett Senior, PA-C working with Serita Grit, MD by Rayna Sexton, ED scribe. This patient was seen in room WTR8/WTR8 and the patient's care was started at 2:51 PM.    Chief Complaint  Patient presents with  . Back Pain   The history is provided by the patient. No language interpreter was used.    HPI Comments: Kenneth [name] is a 59 y.o. male, with a history of chronic lower back pain and lumbar disc disease, who presents to the Emergency Department complaining of constant, moderate, generalized, back pain with onset this morning. Pt notes lifting furniture and feeling immediate pain to the affected region. Pt notes intermittent radiation of pain to his bilateral legs and neck and describes it as a "tightening" feeling. He notes a history of chronic back pain with his last surgery in 2006 and further notes typically taking percocet for pain management. He denies any incontinence of his bowels or bladder, numbness, or weakness.  Past Medical History  Diagnosis Date  . HIV positive 2004  . Hypertension   . Meningitis   . Coronary artery disease   . Chest pain at rest 09/23/2012  . Chronic bronchitis     "q year last 5 yr or so" (09/24/2012)  . Exertional dyspnea   . Migraines   . Arthritis     "both shoulders" (09/24/2012)  . Chronic lower back pain   . Pneumonia   . Lumbar disc disease   . Iliac artery aneurysm     Right   Past Surgical History  Procedure Laterality Date  . Intussusception repair  10/2011  . Posterior lumbar fusion  1995  . Elbow surgery  ~ 1997    "removed some stones; right" (09/24/2012)  . Appendectomy  2013  . Back surgery  2006    4 BACK SURGERIES  . Abdominal surgery    . Spine surgery      Injury to back 1995   Family History  Problem Relation Age of Onset  . Heart disease Father   . Heart disease Brother   . Diabetes Brother     History  Substance Use Topics  . Smoking status: Current Every Day Smoker -- 0.50 packs/day for 45 years    Types: Cigarettes    Last Attempt to Quit: 09/24/2012  . Smokeless tobacco: Never Used     Comment: 09/24/2012 "been stopped now ~ 8 month".  3 per day now.  . Alcohol Use: No    Review of Systems  Gastrointestinal:       Negative incontinence of bowels  Genitourinary:       Negative incontinence of bladder  Musculoskeletal: Positive for myalgias, back pain, neck pain and neck stiffness.  Skin: Negative for wound.  Neurological: Negative for weakness and numbness.      Allergies  Bee venom; Flexeril; Hydrocodone; Ibuprofen; Nabumetone; [name]; Tramadol; Ace inhibitors; and Other  Home Medications   Prior to Admission medications   Medication Sig Start Date End Date Taking? Authorizing Provider  acetaminophen (TYLENOL) 325 MG tablet Take 2 tablets (650 mg total) by mouth every 6 (six) hours as needed. 04/04/14   Harvie Heck, PA-C  amLODipine (NORVASC) 5 MG tablet Take 5 mg by mouth daily.    Historical Provider, MD  carisoprodol (SOMA) 350 MG tablet Take 350 mg by mouth 4 (four) times daily as needed for muscle spasms.  Historical Provider, MD  EPINEPHrine (EPI-PEN) 0.3 mg/0.3 mL SOAJ Inject 0.3 mg into the muscle once as needed (Anaphylaxis).  03/09/13   Montine Circle, PA-C  nitroGLYCERIN (NITROSTAT) 0.4 MG SL tablet Place 0.4 mg under the tongue every 5 (five) minutes as needed for chest pain.  08/11/13   Trinda Pascal, MD  STRIBILD 150-150-200-300 MG TABS tablet TAKE 1 TABLET DAILY WITH BREAKFAST. 08/10/14   Michel Bickers, MD   BP 119/87 mmHg  Pulse 92  Temp(Src) 98.6 F (37 C) (Oral)  Resp 18  SpO2 98% Physical Exam  Constitutional: He is oriented to person, place, and time. He appears well-developed and well-nourished. No distress.  HENT:  Head: Normocephalic and atraumatic.  Mouth/Throat: Oropharynx is clear and moist.  Eyes: Conjunctivae and EOM  are normal. Pupils are equal, round, and reactive to light.  Neck: Normal range of motion. Neck supple. No tracheal deviation present.  Cardiovascular: Normal rate.   Pulmonary/Chest: Breath sounds normal. No respiratory distress.  Abdominal: Soft.  Musculoskeletal: Normal range of motion.  Midline lumbar spine tenderness, left paravertebral lumbar spinal muscle tenderness. No pain with bilateral straight leg raise.  Neurological: He is alert and oriented to person, place, and time.  5/5 and equal lower extremity strength. 2+ and equal patellar reflexes bilaterally. Pt able to dorsiflex bilateral toes and feet with good strength against resistance. Equal sensation bilaterally over thighs and lower legs. Gait is normal   Skin: Skin is warm and dry.  Psychiatric: He has a normal mood and affect. His behavior is normal.  Nursing note and vitals reviewed.   ED Course  Procedures  DIAGNOSTIC STUDIES: Oxygen Saturation is 98% on RA, normal by my interpretation.    COORDINATION OF CARE: 2:55 PM Discussed treatment plan with pt at bedside and pt agreed to plan.  Labs Review Labs Reviewed - No data to display  Imaging Review Dg Lumbar Spine Complete  03/22/2015   CLINICAL DATA:  Injured moving furniture, severe low back pain shooting down legs  EXAM: LUMBAR SPINE - COMPLETE 4+ VIEW  COMPARISON:  CT abdomen and pelvis 02/01/2014  FINDINGS: Hypoplastic last rib pair.  Five non-rib-bearing lumbar vertebra.  Multilevel facet degenerative changes lower lumbar spine.  Scattered disc space narrowing at mid to lower lumbar spine.  Vertebral body heights maintained without fracture or subluxation.  No bone destruction or spondylolysis.  Scattered vascular calcifications in pelvis.  SI joints symmetric.  IMPRESSION: Degenerative disc and facet disease changes of the mid to lower lumbar spine.  No acute osseous abnormalities.   Electronically Signed   By: Lavonia Dana M.D.   On: 03/22/2015 15:25     EKG  Interpretation None      MDM   Final diagnoses:  Lumbar strain, initial encounter    Patient with history of chronic back pain, here with lower back pain after lifting heavy furniture. On exam patient has midline lumbar spine tenderness as well as left paravertebral tenderness. Neurovascularly intact. X-ray obtained is negative. Patient feels better after Dilaudid 1 mg IM, Robaxin, home with Percocet, Robaxin. Patient is allergic to NSAIDs. Follow-up with primary care doctor. At this time there is no evidence of cauda equina, patient is afebrile, ambulatory, neurovascularly intact  Filed Vitals:   03/22/15 1407  BP: 119/87  Pulse: 92  Temp: 98.6 F (37 C)  TempSrc: Oral  Resp: 18  SpO2: 98%   I personally performed the services described in this documentation, which was scribed in my presence. The  recorded information has been reviewed and is accurate.   Jeannett Senior, PA-C 03/22/15 [place], MD 03/25/15 [id number]

## 2015-03-22 NOTE — Discharge Instructions (Signed)
Take Percocet as prescribed as needed for severe pain. Robaxin for muscle spasms. Try heating pads. Stretches. See exercises below. Follow-up with primary care doctor.   Back Pain, Adult Low back pain is very common. About 1 in 5 people have back pain.The cause of low back pain is rarely dangerous. The pain often gets better over time.About half of people with a sudden onset of back pain feel better in just 2 weeks. About 8 in 10 people feel better by 6 weeks.  CAUSES Some common causes of back pain include:  Strain of the muscles or ligaments supporting the spine.  Wear and tear (degeneration) of the spinal discs.  Arthritis.  Direct injury to the back. DIAGNOSIS Most of the time, the direct cause of low back pain is not known.However, back pain can be treated effectively even when the exact cause of the pain is unknown.Answering your caregiver's questions about your overall health and symptoms is one of the most accurate ways to make sure the cause of your pain is not dangerous. If your caregiver needs more information, he or she may order lab work or imaging tests (X-rays or MRIs).However, even if imaging tests show changes in your back, this usually does not require surgery. HOME CARE INSTRUCTIONS For many people, back pain returns.Since low back pain is rarely dangerous, it is often a condition that people can learn to [facility] their own.   Remain active. It is stressful on the back to sit or stand in one place. Do not sit, drive, or stand in one place for more than 30 minutes at a time. Take short walks on level surfaces as soon as pain allows.Try to increase the length of time you walk each day.  Do not stay in bed.Resting more than 1 or 2 days can delay your recovery.  Do not avoid exercise or work.Your body is made to move.It is not dangerous to be active, even though your back may hurt.Your back will likely heal faster if you return to being active before your pain is  gone.  Pay attention to your body when you bend and lift. Many people have less discomfortwhen lifting if they bend their knees, keep the load close to their bodies,and avoid twisting. Often, the most comfortable positions are those that put less stress on your recovering back.  Find a comfortable position to sleep. Use a firm mattress and lie on your side with your knees slightly bent. If you lie on your back, put a pillow under your knees.  Only take over-the-counter or prescription medicines as directed by your caregiver. Over-the-counter medicines to reduce pain and inflammation are often the most helpful.Your caregiver may prescribe muscle relaxant drugs.These medicines help dull your pain so you can more quickly return to your normal activities and healthy exercise.  Put ice on the injured area.  Put ice in a plastic bag.  Place a towel between your skin and the bag.  Leave the ice on for 15-20 minutes, 03-04 times a day for the first 2 to 3 days. After that, ice and heat may be alternated to reduce pain and spasms.  Ask your caregiver about trying back exercises and gentle massage. This may be of some benefit.  Avoid feeling anxious or stressed.Stress increases muscle tension and can worsen back pain.It is important to recognize when you are anxious or stressed and learn ways to manage it.Exercise is a great option. SEEK MEDICAL CARE IF:  You have pain that is not relieved with  rest or medicine.  You have pain that does not improve in 1 week.  You have new symptoms.  You are generally not feeling well. SEEK IMMEDIATE MEDICAL CARE IF:   You have pain that radiates from your back into your legs.  You develop new bowel or bladder control problems.  You have unusual weakness or numbness in your arms or legs.  You develop nausea or vomiting.  You develop abdominal pain.  You feel faint. Document Released: 09/04/2005 Document Revised: 03/05/2012 Document Reviewed:  01/06/2014 [facility] Patient Information 2015 [place], [place]. This information is not intended to replace advice given to you by your health care provider. Make sure you discuss any questions you have with your health care provider.   Back Exercises Back exercises help treat and prevent back injuries. The goal of back exercises is to increase the strength of your abdominal and back muscles and the flexibility of your back. These exercises should be started when you no longer have back pain. Back exercises include:  Pelvic Tilt. Lie on your back with your knees bent. Tilt your pelvis until the lower part of your back is against the floor. Hold this position 5 to 10 sec and repeat 5 to 10 times.  Knee to Chest. Pull first 1 knee up against your chest and hold for 20 to 30 seconds, repeat this with the other knee, and then both knees. This may be done with the other leg straight or bent, whichever feels better.  Sit-Ups or Curl-Ups. Bend your knees 90 degrees. Start with tilting your pelvis, and do a partial, slow sit-up, lifting your trunk only 30 to 45 degrees off the floor. Take at least 2 to 3 seconds for each sit-up. Do not do sit-ups with your knees out straight. If partial sit-ups are difficult, simply do the above but with only tightening your abdominal muscles and holding it as directed.  Hip-Lift. Lie on your back with your knees flexed 90 degrees. Push down with your feet and shoulders as you raise your hips a couple inches off the floor; hold for 10 seconds, repeat 5 to 10 times.  Back arches. Lie on your stomach, propping yourself up on bent elbows. Slowly press on your hands, causing an arch in your low back. Repeat 3 to 5 times. Any initial stiffness and discomfort should lessen with repetition over time.  Shoulder-Lifts. Lie face down with arms beside your body. Keep hips and torso pressed to floor as you slowly lift your head and shoulders off the floor. Do not overdo your exercises,  especially in the beginning. Exercises may cause you some mild back discomfort which lasts for a few minutes; however, if the pain is more severe, or lasts for more than 15 minutes, do not continue exercises until you see your caregiver. Improvement with exercise therapy for back problems is slow.  See your caregivers for assistance with developing a proper back exercise program. Document Released: 10/12/2004 Document Revised: 11/27/2011 Document Reviewed: 07/06/2011 [facility] Patient Information 2015 [place], [place]. This information is not intended to replace advice given to you by your health care provider. Make sure you discuss any questions you have with your health care provider.

## 2015-04-25 ENCOUNTER — Encounter (HOSPITAL_COMMUNITY): Payer: Self-pay | Admitting: Emergency Medicine

## 2015-04-25 ENCOUNTER — Emergency Department (HOSPITAL_COMMUNITY)
Admission: EM | Admit: 2015-04-25 | Discharge: 2015-04-25 | Disposition: A | Discharge status: Home / Self Care | Payer: Medicare Other | Attending: Emergency Medicine | Admitting: Emergency Medicine

## 2015-04-25 DIAGNOSIS — M5416 Radiculopathy, lumbar region: Secondary | ICD-10-CM | POA: Diagnosis not present

## 2015-04-25 DIAGNOSIS — I10 Essential (primary) hypertension: Secondary | ICD-10-CM | POA: Diagnosis not present

## 2015-04-25 DIAGNOSIS — M19011 Primary osteoarthritis, right shoulder: Secondary | ICD-10-CM | POA: Diagnosis not present

## 2015-04-25 DIAGNOSIS — M19012 Primary osteoarthritis, left shoulder: Secondary | ICD-10-CM | POA: Insufficient documentation

## 2015-04-25 DIAGNOSIS — I2510 Atherosclerotic heart disease of native coronary artery without angina pectoris: Secondary | ICD-10-CM | POA: Insufficient documentation

## 2015-04-25 DIAGNOSIS — Z8709 Personal history of other diseases of the respiratory system: Secondary | ICD-10-CM | POA: Diagnosis not present

## 2015-04-25 DIAGNOSIS — G8929 Other chronic pain: Secondary | ICD-10-CM | POA: Insufficient documentation

## 2015-04-25 DIAGNOSIS — Z79899 Other long term (current) drug therapy: Secondary | ICD-10-CM | POA: Diagnosis not present

## 2015-04-25 DIAGNOSIS — Z21 Asymptomatic human immunodeficiency virus [HIV] infection status: Secondary | ICD-10-CM | POA: Diagnosis not present

## 2015-04-25 DIAGNOSIS — M545 Low back pain: Secondary | ICD-10-CM | POA: Diagnosis present

## 2015-04-25 DIAGNOSIS — Z8701 Personal history of pneumonia (recurrent): Secondary | ICD-10-CM | POA: Insufficient documentation

## 2015-04-25 DIAGNOSIS — Z720 Tobacco use: Secondary | ICD-10-CM | POA: Diagnosis not present

## 2015-04-25 MED ORDER — OXYCODONE-ACETAMINOPHEN 5-325 MG PO TABS: 1.0000 | ORAL_TABLET | ORAL | Status: DC | PRN

## 2015-04-25 MED ORDER — DEXAMETHASONE SODIUM PHOSPHATE 10 MG/ML IJ SOLN
10.0000 mg | Freq: Once | INTRAMUSCULAR | Status: AC
  Administered 2015-04-25: 10 mg via INTRAMUSCULAR
  Filled 2015-04-25: qty 1

## 2015-04-25 MED ORDER — METHOCARBAMOL 500 MG PO TABS: 500.0000 mg | ORAL_TABLET | Freq: Two times a day (BID) | ORAL | Status: DC

## 2015-04-25 MED ORDER — PREDNISONE 10 MG PO TABS: ORAL_TABLET | ORAL | Status: DC

## 2015-04-25 MED ORDER — OXYCODONE-ACETAMINOPHEN 5-325 MG PO TABS
2.0000 | ORAL_TABLET | Freq: Once | ORAL | Status: AC
  Administered 2015-04-25: 2 via ORAL
  Filled 2015-04-25: qty 2

## 2015-04-25 NOTE — ED Notes (Signed)
Per pt co lower back pain radiating to left leg. Pt has difficulty ambulating to room. Pt denies Hx of sciatica nor fall or injury.

## 2015-04-25 NOTE — Discharge Instructions (Signed)
Take prednisone as prescribed until all gone for inflammation. Take percocet for severe pain. Robaxin for spasms. Follow up with your doctor or neurosurgeon as referred.   Radicular Pain Radicular pain in either the arm or leg is usually from a bulging or herniated disk in the spine. A piece of the herniated disk may press against the nerves as the nerves exit the spine. This causes pain which is felt at the tips of the nerves down the arm or leg. Other causes of radicular pain may include:  Fractures.  Heart disease.  Cancer.  An abnormal and usually degenerative state of the nervous system or nerves (neuropathy). Diagnosis may require CT or MRI scanning to determine the primary cause.  Nerves that start at the neck (nerve roots) may cause radicular pain in the outer shoulder and arm. It can spread down to the thumb and fingers. The symptoms vary depending on which nerve root has been affected. In most cases radicular pain improves with conservative treatment. Neck problems may require physical therapy, a neck collar, or cervical traction. Treatment may take many weeks, and surgery may be considered if the symptoms do not improve.  Conservative treatment is also recommended for sciatica. Sciatica causes pain to radiate from the lower back or buttock area down the leg into the foot. Often there is a history of back problems. Most patients with sciatica are better after 2 to 4 weeks of rest and other supportive care. Short term bed rest can reduce the disk pressure considerably. Sitting, however, is not a good position since this increases the pressure on the disk. You should avoid bending, lifting, and all other activities which make the problem worse. Traction can be used in severe cases. Surgery is usually reserved for patients who do not improve within the first months of treatment. Only take over-the-counter or prescription medicines for pain, discomfort, or fever as directed by your caregiver.  Narcotics and muscle relaxants may help by relieving more severe pain and spasm and by providing mild sedation. Cold or massage can give significant relief. Spinal manipulation is not recommended. It can increase the degree of disc protrusion. Epidural steroid injections are often effective treatment for radicular pain. These injections deliver medicine to the spinal nerve in the space between the protective covering of the spinal cord and back bones (vertebrae). Your caregiver can give you more information about steroid injections. These injections are most effective when given within two weeks of the onset of pain.  You should see your caregiver for follow up care as recommended. A program for neck and back injury rehabilitation with stretching and strengthening exercises is an important part of management.  SEEK IMMEDIATE MEDICAL CARE IF:  You develop increased pain, weakness, or numbness in your arm or leg.  You develop difficulty with bladder or bowel control.  You develop abdominal pain. Document Released: 10/12/2004 Document Revised: 11/27/2011 Document Reviewed: 12/28/2008 [facility] Patient Information 2015 [place], [place]. This information is not intended to replace advice given to you by your health care provider. Make sure you discuss any questions you have with your health care provider.

## 2015-04-25 NOTE — ED Provider Notes (Signed)
CSN: [id number]     Arrival date & time 04/25/15  1539 History  This chart was scribed for Kenneth Senior, PA-C, working with Forde Dandy, MD by Starleen Arms, ED Scribe. This patient was seen in room WTR7/WTR7 and the patient's care was started at 4:18 PM.   Chief Complaint  Patient presents with  . Back Pain    lower   The history is provided by the patient. No language interpreter was used.    HPI Comments: Ari Bernabei is a 59 y.o. male who presents to the Emergency Department complaining of an exacerbation of his chronic low back pain with radiation into the left buttock.  There was no injury and the patient suspects this flare was caused by use of a new mattress.  This episode is typical of his chronic back pain and he has not taken anything for this complaint.  The patient is followed by a neurosurgeon and is scheduled to be seen on 8/27.  He denies numbness/weakness in lower extremities, fever, dysuria, bowel/bladder incontinence.   Past Medical History  Diagnosis Date  . HIV positive 2004  . Hypertension   . Meningitis   . Coronary artery disease   . Chest pain at rest 09/23/2012  . Chronic bronchitis     "q year last 5 yr or so" (09/24/2012)  . Exertional dyspnea   . Migraines   . Arthritis     "both shoulders" (09/24/2012)  . Chronic lower back pain   . Pneumonia   . Lumbar disc disease   . Iliac artery aneurysm     Right   Past Surgical History  Procedure Laterality Date  . Intussusception repair  10/2011  . Posterior lumbar fusion  1995  . Elbow surgery  ~ 1997    "removed some stones; right" (09/24/2012)  . Appendectomy  2013  . Back surgery  2006    4 BACK SURGERIES  . Abdominal surgery    . Spine surgery      Injury to back 1995   Family History  Problem Relation Age of Onset  . Heart disease Father   . Heart disease Brother   . Diabetes Brother    History  Substance Use Topics  . Smoking status: Current Every Day Smoker -- 0.50 packs/day for 45 years     Types: Cigarettes    Last Attempt to Quit: 09/24/2012  . Smokeless tobacco: Never Used     Comment: 09/24/2012 "been stopped now ~ 8 month".  3 per day now.  . Alcohol Use: No    Review of Systems  Constitutional: Negative for fever.  Genitourinary: Negative for dysuria.  Musculoskeletal: Positive for back pain.  Neurological: Negative for weakness and numbness.      Allergies  Bee venom; Flexeril; Hydrocodone; Ibuprofen; Nabumetone; [name]; Tramadol; Ace inhibitors; and Other  Home Medications   Prior to Admission medications   Medication Sig Start Date End Date Taking? Authorizing Provider  acetaminophen (TYLENOL) 325 MG tablet Take 2 tablets (650 mg total) by mouth every 6 (six) hours as needed. 04/04/14   Harvie Heck, PA-C  amLODipine (NORVASC) 5 MG tablet Take 5 mg by mouth daily.    Historical Provider, MD  carisoprodol (SOMA) 350 MG tablet Take 350 mg by mouth 4 (four) times daily as needed for muscle spasms.    Historical Provider, MD  EPINEPHrine (EPI-PEN) 0.3 mg/0.3 mL SOAJ Inject 0.3 mg into the muscle once as needed (Anaphylaxis).  03/09/13   Montine Circle, PA-C  methocarbamol (ROBAXIN) 500 MG tablet Take 1 tablet (500 mg total) by mouth 2 (two) times daily. 03/22/15   Ahnesti Townsend, PA-C  nitroGLYCERIN (NITROSTAT) 0.4 MG SL tablet Place 0.4 mg under the tongue every 5 (five) minutes as needed for chest pain.  08/11/13   Trinda Pascal, MD  oxyCODONE-acetaminophen (PERCOCET) 5-325 MG per tablet Take 1 tablet by mouth every 4 (four) hours as needed for severe pain. 03/22/15   Reilley Latorre, PA-C  STRIBILD 150-150-200-300 MG TABS tablet TAKE 1 TABLET DAILY WITH BREAKFAST. 08/10/14   Michel Bickers, MD   BP 122/86 mmHg  Pulse 94  Temp(Src) 98.6 F (37 C) (Oral)  Resp 14  SpO2 98% Physical Exam  Constitutional: He is oriented to person, place, and time. He appears well-developed and well-nourished. No distress.  HENT:  Head: Normocephalic and atraumatic.   Eyes: Conjunctivae and EOM are normal.  Neck: Neck supple. No tracheal deviation present.  Cardiovascular: Normal rate, regular rhythm and normal heart sounds.   Pulmonary/Chest: Effort normal and breath sounds normal. No respiratory distress. He has no wheezes. He has no rales.  Musculoskeletal: Normal range of motion.  Midline lumbar spine tenderness.  left perispinal lower back muscle tenderness. Pain with straight left leg raise.  Neurological: He is alert and oriented to person, place, and time.  5/5 and equal lower extremity strength. 2+ and equal patellar reflexes bilaterally. Pt able to dorsiflex bilateral toes and feet with good strength against resistance. Equal sensation bilaterally over thighs and lower legs.   Skin: Skin is warm and dry.  Psychiatric: He has a normal mood and affect. His behavior is normal.  Nursing note and vitals reviewed.   ED Course  Procedures (including critical care time)  DIAGNOSTIC STUDIES: Oxygen Saturation is 98% on RA, normal by my interpretation.    COORDINATION OF CARE:  4:22 PM Discussed treatment plan with patient at bedside.  Patient acknowledges and agrees with plan.    Labs Review Labs Reviewed - No data to display  Imaging Review No results found.   EKG Interpretation None      MDM   Final diagnoses:  Lumbar radicular pain   Pt neurovascularly intact. Normal reflexes. Normal sensation. No loss of bowels or urinary incontinence/retention. No recent falls or injuries. Pt ambulatory. Afebrile. No signs of cauda equina. This is acute on chronic pain. Patient has had prior surgery, requests referral to a neurosurgeon in [place].Will refer to ours on-call. Will treat pain with Percocet, prednisone,Flexeril. Follow-up as soon as able. Return precautions discussed.  Filed Vitals:   04/25/15 1545  BP: 122/86  Pulse: 94  Temp: 98.6 F (37 C)  TempSrc: Oral  Resp: 14  SpO2: 98%   I personally performed the services  described in this documentation, which was scribed in my presence. The recorded information has been reviewed and is accurate.   Kenneth Senior, PA-C 04/25/15 [place], MD 04/26/15 [phone]

## 2015-05-10 ENCOUNTER — Emergency Department
Admission: EM | Admit: 2015-05-10 | Discharge: 2015-05-10 | Disposition: A | Discharge status: Hospice | Payer: Medicare Other

## 2015-05-24 ENCOUNTER — Emergency Department
Admission: EM | Admit: 2015-05-24 | Discharge: 2015-05-24 | Disposition: A | Discharge status: Home / Self Care | Payer: Medicare Other | Attending: Emergency Medicine | Admitting: Emergency Medicine

## 2015-05-24 DIAGNOSIS — G8929 Other chronic pain: Secondary | ICD-10-CM | POA: Diagnosis not present

## 2015-05-24 DIAGNOSIS — Z720 Tobacco use: Secondary | ICD-10-CM | POA: Insufficient documentation

## 2015-05-24 DIAGNOSIS — M545 Low back pain, unspecified: Secondary | ICD-10-CM

## 2015-05-24 DIAGNOSIS — Z79899 Other long term (current) drug therapy: Secondary | ICD-10-CM | POA: Insufficient documentation

## 2015-05-24 DIAGNOSIS — I10 Essential (primary) hypertension: Secondary | ICD-10-CM | POA: Diagnosis not present

## 2015-05-24 MED ORDER — DEXAMETHASONE SODIUM PHOSPHATE 10 MG/ML IJ SOLN
10.0000 mg | Freq: Once | INTRAMUSCULAR | Status: AC
  Administered 2015-05-24: 10 mg via INTRAMUSCULAR
  Filled 2015-05-24: qty 1

## 2015-05-24 MED ORDER — ACETAMINOPHEN 500 MG PO TABS
1000.0000 mg | ORAL_TABLET | Freq: Once | ORAL | Status: AC
  Administered 2015-05-24: 1000 mg via ORAL

## 2015-05-24 MED ORDER — ORPHENADRINE CITRATE ER 100 MG PO TB12: 100.0000 mg | ORAL_TABLET | Freq: Two times a day (BID) | ORAL | Status: DC

## 2015-05-24 MED ORDER — PREDNISONE 10 MG PO TABS: 10.0000 mg | ORAL_TABLET | Freq: Two times a day (BID) | ORAL | Status: DC

## 2015-05-24 MED ORDER — ACETAMINOPHEN 500 MG PO TABS
ORAL_TABLET | ORAL | Status: AC
  Filled 2015-05-24: qty 2

## 2015-05-24 MED ORDER — ORPHENADRINE CITRATE 30 MG/ML IJ SOLN
60.0000 mg | INTRAMUSCULAR | Status: AC
  Administered 2015-05-24: 60 mg via INTRAMUSCULAR
  Filled 2015-05-24: qty 2

## 2015-05-24 NOTE — Discharge Instructions (Signed)
Chronic Back Pain  When back pain lasts longer than 3 months, it is called chronic back pain.People with chronic back pain often go through certain periods that are more intense (flare-ups).  CAUSES Chronic back pain can be caused by wear and tear (degeneration) on different structures in your back. These structures include:  The bones of your spine (vertebrae) and the joints surrounding your spinal cord and nerve roots (facets).  The strong, fibrous tissues that connect your vertebrae (ligaments). Degeneration of these structures may result in pressure on your nerves. This can lead to constant pain. HOME CARE INSTRUCTIONS  Avoid bending, heavy lifting, prolonged sitting, and activities which make the problem worse.  Take brief periods of rest throughout the day to reduce your pain. Lying down or standing usually is better than sitting while you are resting.  Take over-the-counter or prescription medicines only as directed by your caregiver. SEEK IMMEDIATE MEDICAL CARE IF:   You have weakness or numbness in one of your legs or feet.  You have trouble controlling your bladder or bowels.  You have nausea, vomiting, abdominal pain, shortness of breath, or fainting. Document Released: 10/12/2004 Document Revised: 11/27/2011 Document Reviewed: 08/19/2011 [facility] Patient Information 2015 [place], [place]. This information is not intended to replace advice given to you by your health care provider. Make sure you discuss any questions you have with your health care provider.  Emergency care providers appreciate that many patients coming to [place] are in severe pain and we wish to address their pain in the safest, most responsible manner.  It is important to recognize, however, that the proper treatment of chronic pain differs from that of the pain of injuries and acute illnesses.  Our goal is to provider quality, safe, personalized care and we thank you for giving [place] the opportunity to serve  you.  The use of narcotics and related agents for chronic pain syndromes may lead to additional physical and psychological problems.  Nearly as many people die from prescription narcotics each year as die from car crashes.  Additionally, this risk is increased if such prescriptions are obtained from a variety of sources.  Therefore, only your primary care physician or a pain management specialist is able to safely treat such syndromes with narcotic medications long-term.  Documentation revealing such prescriptions have been sought from multiple sources may prohibit [place] from providing a refill or different narcotic medication.  Your name may be checked first through the [place].  This database is a record of controlled substance medication prescriptions that the patient has received.  This has been established by [facility] in an effort to eliminate the dangerous, and often life-threatening, practice of obtaining multiple prescriptions from different medical providers.  If you have a chronic pain syndrome (i.e. chronic headaches, recurrent back or neck pain, dental pain, abdominal or pelvic pain without a specific diagnosis, or neuropathic pain such as fibromyalgia) or recurrent visits for the same condition without an acute diagnosis, you may be treated with non-narcotics and other non-addictive medicines.  Allergic reactions or negative side effects that may be reported by a patient to such medications will not typically lead to the use of a narcotic analgesic or other controlled substance as an alternative.  Patients managing chronic pain with a personal physician should have provisions in place for breakthrough pain.  If you are in crisis, you should call your physician.  If your physician directs you to the emergency department, please have the doctor call  and speak to our attending physician concerning your care.  When patients come to the Emergency  Department (ED) with acute medical conditions in which the ED physician feels it is appropriate to prescribe narcotic or sedating pain medication, the physician will prescribe these in very limited quantities.  The amount of these medications will last only until you can see your primary care physician in his/her office.  Any patient who returns to the ED seeking refills should expect only non-narcotic pain medications.  In the event an acute medical condition exists and the emergency physician feels it is necessary that the patient be given a narcotic or sedating medication, a responsible adult driver should be present in the room prior to the medication being given by the nurse.  Prescriptions for narcotic or sedating medications that have been lost, stolen, or expired will NOT be refilled in the ED.  Patients who have chronic pain may receive non-narcotic prescriptions until seen by their primary care physician.  It is every patient's personal responsibility to maintain active prescriptions with his or her primary care physician or specialist.  Take the prescription meds as directed. Follow-up with your primary provider or specialist for ongoing chronic pain management.
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
## 2015-05-24 NOTE — ED Notes (Signed)
Pt states has been moving his daughter's things to college and has done a lot of heavy lifting over last 2 weeks. Pt states for last 1.5 weeks has had low back pain. Pt denies nausea, shob, fever, chills, vomiting. Cms intact in all extremities.

## 2015-06-24 ENCOUNTER — Other Ambulatory Visit: Payer: Medicare Other

## 2015-07-12 ENCOUNTER — Telehealth: Payer: Self-pay | Admitting: *Deleted

## 2015-07-12 ENCOUNTER — Ambulatory Visit: Payer: Medicare Other | Admitting: Internal Medicine

## 2015-07-12 NOTE — Telephone Encounter (Signed)
Asked pt to call [facility] to let us know if he is still living in the [facility] area or if he has moved.  If living in [facility] the pt needs to "Walk-In" to the Center for follow-up care.

## 2015-08-22 ENCOUNTER — Encounter (HOSPITAL_COMMUNITY): Payer: Self-pay | Admitting: Emergency Medicine

## 2015-08-22 ENCOUNTER — Emergency Department (HOSPITAL_COMMUNITY)
Admission: EM | Admit: 2015-08-22 | Discharge: 2015-08-22 | Disposition: A | Discharge status: Home / Self Care | Payer: Medicare Other | Attending: Emergency Medicine | Admitting: Emergency Medicine

## 2015-08-22 DIAGNOSIS — Z21 Asymptomatic human immunodeficiency virus [HIV] infection status: Secondary | ICD-10-CM | POA: Diagnosis not present

## 2015-08-22 DIAGNOSIS — M546 Pain in thoracic spine: Secondary | ICD-10-CM | POA: Diagnosis not present

## 2015-08-22 DIAGNOSIS — Z8701 Personal history of pneumonia (recurrent): Secondary | ICD-10-CM | POA: Diagnosis not present

## 2015-08-22 DIAGNOSIS — G43909 Migraine, unspecified, not intractable, without status migrainosus: Secondary | ICD-10-CM | POA: Insufficient documentation

## 2015-08-22 DIAGNOSIS — Z79899 Other long term (current) drug therapy: Secondary | ICD-10-CM | POA: Diagnosis not present

## 2015-08-22 DIAGNOSIS — F17210 Nicotine dependence, cigarettes, uncomplicated: Secondary | ICD-10-CM | POA: Diagnosis not present

## 2015-08-22 DIAGNOSIS — I2510 Atherosclerotic heart disease of native coronary artery without angina pectoris: Secondary | ICD-10-CM | POA: Insufficient documentation

## 2015-08-22 DIAGNOSIS — Z8709 Personal history of other diseases of the respiratory system: Secondary | ICD-10-CM | POA: Insufficient documentation

## 2015-08-22 DIAGNOSIS — G8929 Other chronic pain: Secondary | ICD-10-CM | POA: Diagnosis not present

## 2015-08-22 DIAGNOSIS — M545 Low back pain: Secondary | ICD-10-CM | POA: Insufficient documentation

## 2015-08-22 DIAGNOSIS — Z8661 Personal history of infections of the central nervous system: Secondary | ICD-10-CM | POA: Insufficient documentation

## 2015-08-22 DIAGNOSIS — M549 Dorsalgia, unspecified: Secondary | ICD-10-CM

## 2015-08-22 DIAGNOSIS — I10 Essential (primary) hypertension: Secondary | ICD-10-CM | POA: Insufficient documentation

## 2015-08-22 MED ORDER — OXYCODONE-ACETAMINOPHEN 5-325 MG PO TABS
2.0000 | ORAL_TABLET | Freq: Once | ORAL | Status: AC
  Administered 2015-08-22: 2 via ORAL
  Filled 2015-08-22: qty 2

## 2015-08-22 MED ORDER — ACETAMINOPHEN 500 MG PO TABS: 500.0000 mg | ORAL_TABLET | Freq: Four times a day (QID) | ORAL | Status: DC | PRN

## 2015-08-22 MED ORDER — PREDNISONE 20 MG PO TABS: 40.0000 mg | ORAL_TABLET | Freq: Every day | ORAL | Status: DC

## 2015-08-22 MED ORDER — METHOCARBAMOL 500 MG PO TABS
500.0000 mg | ORAL_TABLET | Freq: Once | ORAL | Status: AC
  Administered 2015-08-22: 500 mg via ORAL
  Filled 2015-08-22: qty 1

## 2015-08-22 MED ORDER — METHOCARBAMOL 500 MG PO TABS: 500.0000 mg | ORAL_TABLET | Freq: Two times a day (BID) | ORAL | Status: DC

## 2015-08-22 NOTE — ED Notes (Signed)
Pt state he has a hx of back problems that have somehow been exacerbated. No specific trauma. Lower back pain. Alert and oriented. Denies urinary symptoms.

## 2015-08-22 NOTE — ED Notes (Signed)
Awake. Verbally responsive. A/O x4. Resp even and unlabored. No audible adventitious breath sounds noted. ABC's intact.  

## 2015-08-22 NOTE — ED Provider Notes (Signed)
CSN: [id number]     Arrival date & time 08/22/15  1634 History  By signing my name below, I, Soijett Blue, attest that this documentation has been prepared under the direction and in the presence of Bernerd Limbo, PA-C Electronically Signed: Soijett Blue, ED Scribe. 08/22/2015. 5:27 PM.   Chief Complaint  Patient presents with  . Back Pain    The history is provided by the patient. No language interpreter was used.    HPI Comments: Kenneth [name] is a 59 y.o. male with a medical hx of HIV, HTN, CAD, chronic low back pain, and lumbar disc disease who presents to the Emergency Department complaining of chronic low back pain onset 2 days ago. He reports that the back pain does not radiate. He notes that his current back pain is an exacerbation of his chronic back pain. Denies injury/trauma to his back recently. He denies seeing a neurosurgeon for his spine. He states that he has had 4 surgeries to his spine. He states that he has tried to find a neurosurgeon so that he can have treatment of his chronic back pain. He states that he has tried tylenol and a back brace with relief for his symptoms. Pt denies bowel/bladder incontinence, CP, abdominal pain, n/v, numbness, tingling, gait problem, and any other symptoms. Denies CA or IV drug use. Denies allergies to medications. He notes that he sees Dr. Megan Salon for his HIV management and he takes Stribild. His last CD4 count and viral load was 7 months ago and was "good." Per record review, CD4 810 and viral load undetectable in October 2015.   Per pt chart review, pt has been seen multiple times in the ED and Urgent Care for his chronic back pain with his last visit being 06/26/15 at a Urgent Care where he was rx percocet, neurontin, and referred to a pain clinic for the relief of his symptoms.    Past Medical History  Diagnosis Date  . HIV [place] ([place]) 2004  . Hypertension   . Meningitis   . Coronary artery disease   . Chest pain at rest 09/23/2012   . Chronic bronchitis ([place])     "q year last 5 yr or so" (09/24/2012)  . Exertional dyspnea   . Migraines   . Arthritis     "both shoulders" (09/24/2012)  . Chronic lower back pain   . Pneumonia   . Lumbar disc disease   . Iliac artery aneurysm [facility])     Right   Past Surgical History  Procedure Laterality Date  . Intussusception repair  10/2011  . Posterior lumbar fusion  1995  . Elbow surgery  ~ 1997    "removed some stones; right" (09/24/2012)  . Appendectomy  2013  . Back surgery  2006    4 BACK SURGERIES  . Abdominal surgery    . Spine surgery      Injury to back 1995   Family History  Problem Relation Age of Onset  . Heart disease Father   . Heart disease Brother   . Diabetes Brother    Social History  Substance Use Topics  . Smoking status: Current Every Day Smoker -- 0.50 packs/day for 45 years    Types: Cigarettes    Last Attempt to Quit: 09/24/2012  . Smokeless tobacco: Never Used     Comment: 09/24/2012 "been stopped now ~ 8 month".  3 per day now.  . Alcohol Use: No      Review of Systems  Cardiovascular: Negative  for chest pain.  Gastrointestinal: Negative for abdominal pain.       No bowel incontinence  Genitourinary:       No bladder incontinence  Musculoskeletal: Positive for back pain. Negative for joint swelling and gait problem.  Skin: Negative for color change, rash and wound.  Neurological: Negative for weakness and numbness.       No tingling      Allergies  Bee venom; Flexeril; Hydrocodone; Ibuprofen; Nabumetone; [name]; Tramadol; Ace inhibitors; and Other  Home Medications   Prior to Admission medications   Medication Sig Start Date End Date Taking? Authorizing Provider  amLODipine (NORVASC) 5 MG tablet Take 5 mg by mouth daily.   Yes Historical Provider, MD  EPINEPHrine (EPI-PEN) 0.3 mg/0.3 mL SOAJ Inject 0.3 mg into the muscle once as needed (Anaphylaxis).  03/09/13  Yes Montine Circle, PA-C  nitroGLYCERIN (NITROSTAT) 0.4 MG SL  tablet Place 0.4 mg under the tongue every 5 (five) minutes as needed for chest pain.  08/11/13  Yes Trinda Pascal, MD  orphenadrine (NORFLEX) 100 MG tablet Take 1 tablet (100 mg total) by mouth 2 (two) times daily. 05/24/15  Yes Jenise V Bacon Menshew, PA-C  STRIBILD 150-150-200-300 MG TABS tablet TAKE 1 TABLET DAILY WITH BREAKFAST. 08/10/14  Yes Michel Bickers, MD  acetaminophen (TYLENOL) 500 MG tablet Take 1 tablet (500 mg total) by mouth every 6 (six) hours as needed. 08/22/15   Marella Chimes, PA-C  methocarbamol (ROBAXIN) 500 MG tablet Take 1 tablet (500 mg total) by mouth 2 (two) times daily. 08/22/15   Marella Chimes, PA-C  oxyCODONE-acetaminophen (PERCOCET) 5-325 MG per tablet Take 1 tablet by mouth every 4 (four) hours as needed for severe pain. Patient not taking: Reported on 04/25/2015 03/22/15   Jeannett Senior, PA-C  oxyCODONE-acetaminophen (PERCOCET) 5-325 MG per tablet Take 1 tablet by mouth every 4 (four) hours as needed for severe pain. Patient not taking: Reported on 08/22/2015 04/25/15   Tatyana Kirichenko, PA-C  predniSONE (DELTASONE) 20 MG tablet Take 2 tablets (40 mg total) by mouth daily. 08/22/15   Marella Chimes, PA-C    BP 122/87 mmHg  Pulse 93  Temp(Src) 97.9 F (36.6 C) (Oral)  Resp 18  SpO2 100% Physical Exam  Constitutional: He is oriented to person, place, and time. He appears well-developed and well-nourished. No distress.  HENT:  Head: Normocephalic and atraumatic.  Right Ear: External ear normal.  Left Ear: External ear normal.  Nose: Nose normal.  Eyes: Conjunctivae and EOM are normal. Right eye exhibits no discharge. Left eye exhibits no discharge. No scleral icterus.  Neck: Normal range of motion. Neck supple.  Cardiovascular: Normal rate, regular rhythm, normal heart sounds and intact distal pulses.  Exam reveals no gallop and no friction rub.   No murmur heard. Pulmonary/Chest: Effort normal and breath sounds normal. No respiratory  distress. He has no wheezes. He has no rales. He exhibits no tenderness.  Abdominal: Soft. Bowel sounds are normal. He exhibits no distension and no mass. There is no tenderness. There is no rebound and no guarding.  Musculoskeletal: Normal range of motion. He exhibits tenderness. He exhibits no edema.       Thoracic back: He exhibits tenderness. He exhibits no bony tenderness and no deformity.       Lumbar back: He exhibits tenderness. He exhibits no deformity.  TTP to right thoracic paraspinal muscles with no midline tenderness, step-off, or deformities. Diffuse TTP of lumbar spine and paraspinal muscles with no  step-off or deformities. Strength and sensation intact. Pt able to ambulate without difficulty.   Neurological: He is alert and oriented to person, place, and time. He has normal strength and normal reflexes. No sensory deficit. Gait normal.  Skin: Skin is warm and dry. He is not diaphoretic.  Psychiatric: He has a normal mood and affect. His behavior is normal.  Nursing note and vitals reviewed.   ED Course  Procedures (including critical care time)  DIAGNOSTIC STUDIES: Oxygen Saturation is 100% on RA, nl by my interpretation.    COORDINATION OF CARE: 5:22 PM Discussed treatment plan with pt at bedside which includes f/u with spine surgeon, robaxin, and percocet and pt agreed to plan.  Labs Review Labs Reviewed - No data to display  Imaging Review No results found.    EKG Interpretation None      MDM   Final diagnoses:  Back pain, unspecified location    59 year old male presents with acute on chronic low back pain, which she states started 2 days ago. Denies fever, chills, chest pain, shortness of breath, abdominal pain, nausea, vomiting, numbness, weakness, paresthesia, saddle anesthesia, bowel or bladder incontinence, history of malignancy, history of IV drug use.  Patient is afebrile. Vital signs stable. Heart regular rate and rhythm. Lungs clear to  auscultation bilaterally. Abdomen soft, nontender, nondistended. TTP to right thoracic paraspinal muscles. No midline tenderness, step-off, or deformity. Diffuse TTP of lumbar spine and paraspinal muscles. No step-off or deformity. Hardware in lumbar spine present. Strength and sensation intact. DTRs intact. Patient able to ambulate without difficulty.  Patient states his symptoms are consistent with his history of back pain, and denies change in symptoms or recent injury. Do not feel imaging is indicated at this time. Low suspicion for cauda equina, hematoma, abscess. Patient appears uncomfortable due to pain, will give pain medication in the ED and discharge with tylenol, robaxin, and prednisone (patient states he is unable to take NSAIDs). Patient to follow-up with neurosurgery for further evaluation and management. Return precautions discussed. Patient verbalizes his understanding and is in agreement with plan.   BP 122/87 mmHg  Pulse 93  Temp(Src) 97.9 F (36.6 C) (Oral)  Resp 18  SpO2 100%  I personally performed the services described in this documentation, which was scribed in my presence. The recorded information has been reviewed and is accurate.     Marella Chimes, PA-C 08/22/15 1745  Harvel Quale, MD 08/23/15 [phone]

## 2015-08-22 NOTE — Discharge Instructions (Signed)
1. Medications: tylenol, robaxin, prednisone, usual home medications 2. Treatment: rest, drink plenty of fluids 3. Follow Up: please followup with neurosurgery for discussion of your diagnoses and further evaluation after today's visit; please return to the ER for numbness, weakness, loss of control of your bowel or bladder, new or worsening symptoms   Back Pain, Adult Back pain is very common in adults.The cause of back pain is rarely dangerous and the pain often gets better over time.The cause of your back pain may not be known. Some common causes of back pain include:  Strain of the muscles or ligaments supporting the spine.  Wear and tear (degeneration) of the spinal disks.  Arthritis.  Direct injury to the back. For many people, back pain may return. Since back pain is rarely dangerous, most people can learn to manage this condition on their own. HOME CARE INSTRUCTIONS Watch your back pain for any changes. The following actions may help to lessen any discomfort you are feeling:  Remain active. It is stressful on your back to sit or stand in one place for long periods of time. Do not sit, drive, or stand in one place for more than 30 minutes at a time. Take short walks on even surfaces as soon as you are able.Try to increase the length of time you walk each day.  Exercise regularly as directed by your health care provider. Exercise helps your back heal faster. It also helps avoid future injury by keeping your muscles strong and flexible.  Do not stay in bed.Resting more than 1-2 days can delay your recovery.  Pay attention to your body when you bend and lift. The most comfortable positions are those that put less stress on your recovering back. Always use proper lifting techniques, including:  Bending your knees.  Keeping the load close to your body.  Avoiding twisting.  Find a comfortable position to sleep. Use a firm mattress and lie on your side with your knees slightly  bent. If you lie on your back, put a pillow under your knees.  Avoid feeling anxious or stressed.Stress increases muscle tension and can worsen back pain.It is important to recognize when you are anxious or stressed and learn ways to manage it, such as with exercise.  Take medicines only as directed by your health care provider. Over-the-counter medicines to reduce pain and inflammation are often the most helpful.Your health care provider may prescribe muscle relaxant drugs.These medicines help dull your pain so you can more quickly return to your normal activities and healthy exercise.  Apply ice to the injured area:  Put ice in a plastic bag.  Place a towel between your skin and the bag.  Leave the ice on for 20 minutes, 2-3 times a day for the first 2-3 days. After that, ice and heat may be alternated to reduce pain and spasms.  Maintain a healthy weight. Excess weight puts extra stress on your back and makes it difficult to maintain good posture. SEEK MEDICAL CARE IF:  You have pain that is not relieved with rest or medicine.  You have increasing pain going down into the legs or buttocks.  You have pain that does not improve in one week.  You have night pain.  You lose weight.  You have a fever or chills. SEEK IMMEDIATE MEDICAL CARE IF:   You develop new bowel or bladder control problems.  You have unusual weakness or numbness in your arms or legs.  You develop nausea or vomiting.  You  develop abdominal pain.  You feel faint.   This information is not intended to replace advice given to you by your health care provider. Make sure you discuss any questions you have with your health care provider.   Document Released: 09/04/2005 Document Revised: 09/25/2014 Document Reviewed: 01/06/2014 Elsevier Interactive Patient Education 2016 [employer].   Emergency Department Resource Guide 1) Find a Doctor and Pay Out of Pocket Although you won't have to find out who is  covered by your insurance plan, it is a good idea to ask around and get recommendations. You will then need to call the office and see if the doctor you have chosen will accept you as a new patient and what types of options they offer for patients who are self-pay. Some doctors offer discounts or will set up payment plans for their patients who do not have insurance, but you will need to ask so you aren't surprised when you get to your appointment.  2) Contact Your Local Health Department Not all health departments have doctors that can see patients for sick visits, but many do, so it is worth a call to see if yours does. If you don't know where your local health department is, you can check in your phone book. The CDC also has a tool to help you locate your state's health department, and many state websites also have listings of all of their local health departments.  3) Find a [facility] If your illness is not likely to be very severe or complicated, you may want to try a walk in clinic. These are popping up all over the country in pharmacies, drugstores, and shopping centers. They're usually staffed by nurse practitioners or physician assistants that have been trained to treat common illnesses and complaints. They're usually fairly quick and inexpensive. However, if you have serious medical issues or chronic medical problems, these are probably not your best option.  No Primary Care Doctor: - Call Health Connect at  [phone] - they can help you locate a primary care doctor that  accepts your insurance, provides certain services, etc. - Physician Referral Service- [phone]  Chronic Pain Problems: Organization         Address  Phone   Notes  [facility]  [phone] Patients need to be referred by their primary care doctor.   Medication Assistance: Organization         Address  Phone   Notes  [facility] Medication [facility] [address] [place], [place] [address] [phone] --Must be a resident of [facility] -- Must have NO insurance coverage whatsoever (no Medicaid/ Medicare, etc.) -- The pt. MUST have a primary care doctor that directs their care regularly and follows them in the community   MedAssist  [phone]   [employer]  [phone]    Agencies that provide inexpensive medical care: Organization         Address  Phone   Notes  [place]  [phone]   [facility]    [phone]   [facility] [place], [place] [address] [phone]   [place] [address], [place] [phone]   Planned Parenthood    [phone]   [facility]    [phone]   [place] and [place] [address], [place] Phone:  [phone], Fax:  [phone] Hours of Operation:  9 am - 6 pm, M-F.  Also accepts Medicaid/Medicare and self-pay.  [facility] for [place], [address], [place] Phone: [phone], Fax: [phone]. Hours of Operation:  8:30 am - 5:30 pm, M-F.  Also accepts Medicaid and self-pay.  [facility] [address] [place] Phone: [phone]   [place], [place], [place] [phone], Ext. [phone] Mondays & Thursdays: 7-9 AM.  First 15 patients are seen on a first come, first serve basis.    [place] Providers:  Organization         Address  Phone   Notes  [facility] [address], [place] [phone] Also accepts self-pay patients.  [facility] [id number] [place], [place]  [phone]   [place], [address], [place] [phone]   [facility] [address] [place] [phone]   Lucianne Lei [address], [place]   [phone] Only accepts [place] Access [place] patients after they have their name applied to their card.   Self-Pay (no insurance) in [facility]:  Organization         Address  Phone   Notes  Sickle Cell Patients, [facility] Internal Medicine [place] [phone]   [facility] Urgent Care [place] [phone]   [facility] [place]  [place], [place], [place] [phone]   [facility]/Dr. Osei-Bonsu  [address], [place] or [address], [place] [phone] Phone number for both [place] and [place] locations is the same.  Urgent Medical and [facility] [address], [place] [phone]   [facility] [address] [place] or [address] [phone]   [facility] [address], [place] [phone], phone; [phone], fax Sees patients 1st and 3rd Saturday of every month.  Must not qualify for public or private insurance (i.e. Medicaid, Medicare, [place] Health Choice, Veterans' Benefits)  Household income should be no more than 200% of the poverty level The clinic cannot treat you if you are pregnant or think you are pregnant  Sexually transmitted diseases are not treated at the clinic.    Dental Care: Organization         Address  Phone  Notes  [facility] Department of [facility] [place] [phone] Accepts children up to age 64 who are enrolled in [place] or [place]; pregnant women with a Medicaid card; and children who have applied for Medicaid or [place] Health Choice, but were declined, whose parents can pay a reduced fee at time of service.  [facility] Department of [facility]  [address], [place] [phone] Accepts children up to age 60 who are enrolled  in [place] or [place]; pregnant women with a Medicaid card; and children who have applied for Medicaid or [place] Health Choice, but were declined, whose parents can pay a reduced fee at time of service.  [place] Adult Dental Access PROGRAM  [place] [phone] Patients are seen by appointment only. Walk-ins are not accepted. [place]  Dental will see patients 45 years of age and older. Monday - Tuesday (8am-5pm) Most Wednesdays (8:30-5pm) $30 per visit, cash only  [facility] Adult Dental Access PROGRAM  [address], [facility] [phone] Patients are seen by appointment only. Walk-ins are not accepted. [place] will see patients 54 years of age and older. One Wednesday Evening (Monthly: Volunteer Based).  $30 per visit, cash only  [place]  [phone] for adults; Children under age 65, call Graduate Pediatric Dentistry at [phone]. Children aged 41-14, please call [phone] to request a pediatric application.  Dental services are provided in all areas of dental care including fillings, crowns and bridges, complete and partial dentures, implants, gum treatment, root canals, and extractions. Preventive care is also provided. Treatment is provided to both adults and children. Patients are selected via a lottery and there is often a waiting list.   [facility] [address] [place]  [phone] [email or web address]   [facility] [address] [place], [place] [phone], Ext. [phone] Second and Fourth Thursday of each month, opens at 6:30 AM; Clinic ends at 9 AM.  Patients are seen on a first-come first-served basis, and a limited number are seen during each clinic.   [facility]  [address] Hillard Danker [place], [place] [phone]   Eligibility Requirements You must have lived in [place], [place], or [place] counties for at least the last three months.   You cannot be  eligible for state or federal sponsored [employer], including [employer], [place], or [employer].   You generally cannot be eligible for healthcare insurance through your employer.    How to apply: Eligibility screenings are held every Tuesday and Wednesday afternoon from 1:00 pm until 4:00 pm. You do not need an appointment for the interview!  [facility] [address] [place] [address] [place], [place]   [place]  [place] Department  [place]  [phone]    Behavioral Health Resources in the Community: Intensive Outpatient Programs Organization         Address  Phone  Notes  [place] [address] [place] [address], [place], [place] [phone]   [facility] Outpatient [address] [place], [place]   ADS: Alcohol & Drug Svcs [address] [place], [place]   [place] [address],  [place], [place] or [phone]   Substance Abuse Resources Organization         Address  Phone  Notes  Alcohol and Drug Services  [phone]   [place]  [phone]   [facility] [place]   Chinita Pester  [phone]   Residential & Outpatient Substance Abuse Program  [phone]   Psychological Services Organization         Address  Phone  Notes  [facility] [place]  [place]  [phone]   [place] [address], [place] or [phone]    [place] Crisis Teams Organization         Address  Phone  Notes  [facility], [place] Crisis Care Unit  [phone]   Assertive Psychotherapeutic Services  [address] [place], [place]   [facility] [address] [place] [phone]    Self-Help/Support Groups Organization  Address  Phone             Notes  [place] [place] - variety of support groups  [place] Call for more information  Narcotics Anonymous (NA), [facility]aring [facility] [address], [employer] [place]  2 meetings at this location   Special educational needs teacher         Address  Phone  Notes  ASAP Residential Treatment [place],    [place]  [phone]   [facility]  [address], [place] [id number], [place], [place]   [place], [place] [phone] Admissions: 8am-3pm M-F  Incentives Substance [place] [address]    [place], [place] [phone]   [facility] [address] [place], [place], [place]   [facility] [address]  [place], [place]   [facility] Programs - Intensive Outpatient [address], [place], [place]   [facility] ([place] [address]  [place], [place] [phone] or [phone]   [facility] Services (RTS) [address], [place], [place] Accepts Medicaid  [facility] [place] [address],  [place] [phone] Substance Abuse/Addiction Treatment   [facility] Organization         Address  Phone  Notes  [facility]  [phone]   Domenic Schwab, PhD [address] Arlis [place], [place]   [phone] or [phone]   [facility] [place], [place] [phone]   [facility] [address] [place], [place] [phone] Insurance/Medicaid/sponsorship through [facility] and Families [address] [place]                                    [place], [place] [phone] [place] [address], [place] [phone]    Dr. Adele Schilder  [phone]   [facility] Dept. 1) [address] [place] 2) [place] 3)  [place] [address], [place] [phone]  [phone]   [place] [phone] or [phone] (After Hours)

## 2015-08-22 NOTE — ED Notes (Signed)
Pt reported having increase pain to upper and lower back but denies injury/trauma to area, incontinence of bowels/bladder. Pt able to ambulate with steady gait. (+)PMS, CRT brisk to ext.

## 2015-09-19 ENCOUNTER — Emergency Department (HOSPITAL_COMMUNITY)
Admission: EM | Admit: 2015-09-19 | Discharge: 2015-09-19 | Discharge status: Against Medical Advice | Payer: Medicare Other | Attending: Emergency Medicine | Admitting: Emergency Medicine

## 2015-09-19 ENCOUNTER — Encounter (HOSPITAL_COMMUNITY): Payer: Self-pay | Admitting: *Deleted

## 2015-09-19 DIAGNOSIS — G8929 Other chronic pain: Secondary | ICD-10-CM | POA: Insufficient documentation

## 2015-09-19 DIAGNOSIS — I2510 Atherosclerotic heart disease of native coronary artery without angina pectoris: Secondary | ICD-10-CM | POA: Diagnosis not present

## 2015-09-19 DIAGNOSIS — I10 Essential (primary) hypertension: Secondary | ICD-10-CM | POA: Insufficient documentation

## 2015-09-19 DIAGNOSIS — M79603 Pain in arm, unspecified: Secondary | ICD-10-CM | POA: Diagnosis not present

## 2015-09-19 DIAGNOSIS — F17210 Nicotine dependence, cigarettes, uncomplicated: Secondary | ICD-10-CM | POA: Diagnosis not present

## 2015-09-19 DIAGNOSIS — Z21 Asymptomatic human immunodeficiency virus [HIV] infection status: Secondary | ICD-10-CM | POA: Insufficient documentation

## 2015-09-19 DIAGNOSIS — Z8701 Personal history of pneumonia (recurrent): Secondary | ICD-10-CM | POA: Diagnosis not present

## 2015-09-19 NOTE — ED Notes (Signed)
Called pt in waiting room to update vitals no answer

## 2015-09-19 NOTE — ED Notes (Signed)
Attempted to call patient to go back to a room with no answer x3. EMT up front states she called for patient as well with no response.

## 2015-09-19 NOTE — ED Notes (Signed)
Pt reports onset today of pain to bilateral arms and hand, has areas of swelling or "knots" to his arms and hands. Denies fever.

## 2015-11-19 ENCOUNTER — Encounter: Payer: Self-pay | Admitting: Internal Medicine

## 2015-11-22 DIAGNOSIS — R7303 Prediabetes: Secondary | ICD-10-CM

## 2015-11-22 HISTORY — DX: Prediabetes: R73.03

## 2016-02-13 ENCOUNTER — Encounter (HOSPITAL_COMMUNITY): Payer: Self-pay

## 2016-02-13 ENCOUNTER — Emergency Department (HOSPITAL_COMMUNITY)
Admission: EM | Admit: 2016-02-13 | Discharge: 2016-02-13 | Disposition: A | Discharge status: Home / Self Care | Payer: Medicare Other | Attending: Emergency Medicine | Admitting: Emergency Medicine

## 2016-02-13 DIAGNOSIS — Z21 Asymptomatic human immunodeficiency virus [HIV] infection status: Secondary | ICD-10-CM | POA: Insufficient documentation

## 2016-02-13 DIAGNOSIS — Z79899 Other long term (current) drug therapy: Secondary | ICD-10-CM | POA: Diagnosis not present

## 2016-02-13 DIAGNOSIS — F17210 Nicotine dependence, cigarettes, uncomplicated: Secondary | ICD-10-CM | POA: Insufficient documentation

## 2016-02-13 DIAGNOSIS — M545 Low back pain, unspecified: Secondary | ICD-10-CM

## 2016-02-13 DIAGNOSIS — I2510 Atherosclerotic heart disease of native coronary artery without angina pectoris: Secondary | ICD-10-CM | POA: Insufficient documentation

## 2016-02-13 DIAGNOSIS — I10 Essential (primary) hypertension: Secondary | ICD-10-CM | POA: Insufficient documentation

## 2016-02-13 DIAGNOSIS — G8929 Other chronic pain: Secondary | ICD-10-CM

## 2016-02-13 MED ORDER — METHOCARBAMOL 500 MG PO TABS: 500.0000 mg | ORAL_TABLET | Freq: Two times a day (BID) | ORAL | Status: DC

## 2016-02-13 MED ORDER — ACETAMINOPHEN 500 MG PO TABS: 500.0000 mg | ORAL_TABLET | Freq: Four times a day (QID) | ORAL | Status: DC | PRN

## 2016-02-13 MED ORDER — PREDNISONE 20 MG PO TABS
60.0000 mg | ORAL_TABLET | Freq: Once | ORAL | Status: AC
  Administered 2016-02-13: 60 mg via ORAL
  Filled 2016-02-13: qty 3

## 2016-02-13 MED ORDER — METHOCARBAMOL 500 MG PO TABS
500.0000 mg | ORAL_TABLET | Freq: Once | ORAL | Status: AC
  Administered 2016-02-13: 500 mg via ORAL
  Filled 2016-02-13: qty 1

## 2016-02-13 MED ORDER — PREDNISONE 20 MG PO TABS: 40.0000 mg | ORAL_TABLET | Freq: Every day | ORAL | Status: DC

## 2016-02-13 NOTE — ED Notes (Signed)
Patient here with 2-3 days of lower back pain and neck pain. Reports that this flares up from time to time. Denies trauma

## 2016-02-13 NOTE — ED Provider Notes (Signed)
CSN: [id number]     Arrival date & time 02/13/16  1535 History   First MD Initiated Contact with Patient 02/13/16 1548     Chief Complaint  Patient presents with  . Back Pain     (Consider location/radiation/quality/duration/timing/severity/associated sxs/prior Treatment) HPI   60 year old male with history of chronic low back pain, HIV, CAD presenting with complaints of back and neck pain. Patient states he has recurrent back and neck pain. He has been pain-free for the past 7-8 months. He went on a car trip this past week and felt that it may happen worsen his back pain. For the past 3-4 days he has had sharp throbbing pain from his low back that radiates to his neck. Pain is worsened with movement, pain is constant, moderate in severity and not improve despite taking Tylenol. He denies any associated fever, lightheadedness or dizziness, bowel bladder incontinence or saddle anesthesia. No history of IV drug use or active cancer. Has history of HIV and is compliant with his medication. He report that the treatment he received during last visit did help. His doctor is currently is trying to schedule a follow-up appointment with a neurosurgeon in the near future. 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 Denies any numbness or weakness to his legs. No recent injury. His pain felt similar to prior back pain.  Past Medical History  Diagnosis Date  . HIV positive ([place]) 2004  . Hypertension   . Meningitis   . Coronary artery disease   . Chest pain at rest 09/23/2012  . Chronic bronchitis ([place])     "q year last 5 yr or so" (09/24/2012)  . Exertional dyspnea   . Migraines   . Arthritis     "both shoulders" (09/24/2012)  . Chronic lower back pain   . Pneumonia   . Lumbar disc disease   . 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 Intussusception repair  10/2011  . Posterior lumbar fusion  1995  . Elbow surgery  ~ 1997    "removed some  stones; right" (09/24/2012)  . Appendectomy  2013  . Back surgery  2006    4 BACK SURGERIES  . Abdominal surgery    . Spine surgery      Injury to back 1995   Family History  Problem Relation Age of Onset  . Heart disease Father   . Heart disease Brother   . Diabetes Brother    Social History  Substance Use Topics  . Smoking status: Current Every Day Smoker -- 0.50 packs/day for 45 years    Types: Cigarettes    Last Attempt to Quit: 09/24/2012  . Smokeless tobacco: Never Used     Comment: 09/24/2012 "been stopped now ~ 8 month".  3 per day now.  . Alcohol Use: No    Review of Systems  All other systems reviewed and are negative.     Allergies  Bee venom; Flexeril; Hydrocodone; Ibuprofen; Nabumetone; [name]; Tramadol; Ace inhibitors; and Other  Home Medications   Prior to Admission medications   Medication Sig Start Date End Date Taking? Authorizing Provider  acetaminophen (TYLENOL) 500 MG tablet Take 1 tablet (500 mg total) by mouth every 6 (six) hours as needed. 08/22/15   Marella Chimes, PA-C  amLODipine (NORVASC) 5 MG tablet Take 5 mg by mouth daily.    Historical Provider, MD  EPINEPHrine (EPI-PEN) 0.3 mg/0.3 mL SOAJ Inject 0.3  mg into the muscle once as needed (Anaphylaxis).  03/09/13   Montine Circle, PA-C  methocarbamol (ROBAXIN) 500 MG tablet Take 1 tablet (500 mg total) by mouth 2 (two) times daily. 08/22/15   Marella Chimes, PA-C  nitroGLYCERIN (NITROSTAT) 0.4 MG SL tablet Place 0.4 mg under the tongue every 5 (five) minutes as needed for chest pain.  08/11/13   Trinda Pascal, MD  orphenadrine (NORFLEX) 100 MG tablet Take 1 tablet (100 mg total) by mouth 2 (two) times daily. 05/24/15   Jenise V Bacon Menshew, PA-C  oxyCODONE-acetaminophen (PERCOCET) 5-325 MG per tablet Take 1 tablet by mouth every 4 (four) hours as needed for severe pain. Patient not taking: Reported on 04/25/2015 03/22/15   Jeannett Senior, PA-C  oxyCODONE-acetaminophen (PERCOCET) 5-325  MG per tablet Take 1 tablet by mouth every 4 (four) hours as needed for severe pain. Patient not taking: Reported on 08/22/2015 04/25/15   Tatyana Kirichenko, PA-C  predniSONE (DELTASONE) 20 MG tablet Take 2 tablets (40 mg total) by mouth daily. 08/22/15   Elizabeth C [place], PA-C  STRIBILD 150-150-200-300 MG TABS tablet TAKE 1 TABLET DAILY WITH BREAKFAST. 08/10/14   Michel Bickers, MD   BP 134/97 mmHg  Pulse 93  Temp(Src) 98.2 F (36.8 C) (Oral)  Resp 18  SpO2 98% Physical Exam  Constitutional: He is oriented to person, place, and time. He appears well-developed and well-nourished. No distress.  Well appearing African-American male laying in bed nontoxic in appearance  HENT:  Head: Atraumatic.  Eyes: Conjunctivae are normal.  Neck: Normal range of motion. Neck supple.  No nuchal rigidity. No carotid bruit.  Cardiovascular: Normal rate, regular rhythm and intact distal pulses.   Pulmonary/Chest: Effort normal and breath sounds normal. No respiratory distress. He has no wheezes. He has no rales. He exhibits no tenderness.  Abdominal: Soft. Bowel sounds are normal. He exhibits no distension. There is no tenderness.  Musculoskeletal: He exhibits tenderness (Tenderness along the entire spine midline on palpation without crepitus or step-off. Tenderness to bilateral lumbar paraspinal muscle and bilateral trapezius muscle with full range of motion.).  5 out 5 strength to bilateral lower extremities. Able to ambulate.  Neurological: He is alert and oriented to person, place, and time.  Skin: No rash noted.  Psychiatric: He has a normal mood and affect.  Nursing note and vitals reviewed.   ED Course  Procedures (including critical care time)   MDM   Final diagnoses:  Chronic bilateral low back pain without sciatica    BP 134/97 mmHg  Pulse 93  Temp(Src) 98.2 F (36.8 C) (Oral)  Resp 18  SpO2 98%   4:07 PM Patient presents with acute on chronic back pain radiates to neck. Has  history of aneurysm however I do not think his pain is related to his aneurysm or dissection. He has intact pulses. He has no red flags. He is able to emanate. Patient will be discharged with Tylenol, Robaxin, and prednisone. He will follow-up with neurosurgeon for further management. Return precaution discussed.  Domenic Moras, PA-C 02/13/16 1821  Ezequiel Essex, MD 02/13/16 2222

## 2016-02-13 NOTE — ED Notes (Signed)
Declined W/C at D/C and was escorted to lobby by RN. 

## 2016-02-13 NOTE — Discharge Instructions (Signed)

## 2016-02-14 ENCOUNTER — Encounter (HOSPITAL_COMMUNITY): Payer: Self-pay | Admitting: Nurse Practitioner

## 2016-02-14 ENCOUNTER — Emergency Department (HOSPITAL_COMMUNITY)
Admission: EM | Admit: 2016-02-14 | Discharge: 2016-02-14 | Disposition: A | Discharge status: Home / Self Care | Payer: Medicare Other | Attending: Emergency Medicine | Admitting: Emergency Medicine

## 2016-02-14 DIAGNOSIS — Z8709 Personal history of other diseases of the respiratory system: Secondary | ICD-10-CM | POA: Diagnosis not present

## 2016-02-14 DIAGNOSIS — F17210 Nicotine dependence, cigarettes, uncomplicated: Secondary | ICD-10-CM | POA: Diagnosis not present

## 2016-02-14 DIAGNOSIS — G8929 Other chronic pain: Secondary | ICD-10-CM | POA: Insufficient documentation

## 2016-02-14 DIAGNOSIS — Z79899 Other long term (current) drug therapy: Secondary | ICD-10-CM | POA: Diagnosis not present

## 2016-02-14 DIAGNOSIS — M549 Dorsalgia, unspecified: Secondary | ICD-10-CM | POA: Diagnosis present

## 2016-02-14 DIAGNOSIS — M545 Low back pain, unspecified: Secondary | ICD-10-CM

## 2016-02-14 DIAGNOSIS — G43909 Migraine, unspecified, not intractable, without status migrainosus: Secondary | ICD-10-CM | POA: Insufficient documentation

## 2016-02-14 DIAGNOSIS — M1990 Unspecified osteoarthritis, unspecified site: Secondary | ICD-10-CM | POA: Diagnosis not present

## 2016-02-14 DIAGNOSIS — Z8701 Personal history of pneumonia (recurrent): Secondary | ICD-10-CM | POA: Insufficient documentation

## 2016-02-14 DIAGNOSIS — B20 Human immunodeficiency virus [HIV] disease: Secondary | ICD-10-CM | POA: Diagnosis not present

## 2016-02-14 DIAGNOSIS — I10 Essential (primary) hypertension: Secondary | ICD-10-CM | POA: Diagnosis not present

## 2016-02-14 DIAGNOSIS — I2510 Atherosclerotic heart disease of native coronary artery without angina pectoris: Secondary | ICD-10-CM | POA: Insufficient documentation

## 2016-02-14 MED ORDER — OXYCODONE-ACETAMINOPHEN 5-325 MG PO TABS
1.0000 | ORAL_TABLET | Freq: Once | ORAL | Status: AC
  Administered 2016-02-14: 1 via ORAL
  Filled 2016-02-14: qty 1

## 2016-02-14 NOTE — ED Notes (Signed)
States he was here yesterday for neck and back pain. Pain is worse. He started prednisone and muscle relaxer yesterday with no relief. He also tried tylenol with no relief. States he can not sleep due to pain. Denies bowel/bladder changes. Ambulatory.

## 2016-02-14 NOTE — ED Notes (Signed)
Declined W/C at D/C and was escorted to lobby by RN. 

## 2016-02-14 NOTE — ED Provider Notes (Signed)
CSN: [id number]     Arrival date & time 02/14/16  1139 History   First MD Initiated Contact with Patient 02/14/16 1331     Chief Complaint  Patient presents with  . Back Pain    HPI   60 year old male presents today with back pain. Patient reports a history of chronic back pain, currently being seen in [place] by back specialist. Patient reports that his back pain has been worse over the last several days as he has been playing with his grandchildren more. Patient notes this is similar location to previous back pain, denies any distal neurological deficits, saddle anesthesia, fever, or any other red flags. Patient notes that he was seen yesterday given prednisone and muscle relaxers with no improvement in symptoms. Patient reports these taken 3 doses of prednisone at this time. Patient reports seeing that the difficulty.    Past Medical History  Diagnosis Date  . HIV positive ([place]) 2004  . Hypertension   . Meningitis   . Coronary artery disease   . Chest pain at rest 09/23/2012  . Chronic bronchitis ([place])     "q year last 5 yr or so" (09/24/2012)  . Exertional dyspnea   . Migraines   . Arthritis     "both shoulders" (09/24/2012)  . Chronic lower back pain   . Pneumonia   . Lumbar disc disease   . 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 Intussusception repair  10/2011  . Posterior lumbar fusion  1995  . Elbow surgery  ~ 1997    "removed some stones; right" (09/24/2012)  . Appendectomy  2013  . Back surgery  2006    4 BACK SURGERIES  . Abdominal surgery    . Spine surgery      Injury to back 1995   Family History  Problem Relation Age of Onset  . Heart disease Father   . Heart disease Brother   . Diabetes Brother    Social History  Substance Use Topics  . Smoking status: Current Every Day Smoker -- 0.50 packs/day for 45 years    Types: Cigarettes    Last Attempt to Quit: 09/24/2012  . Smokeless tobacco: Never Used      Comment: 09/24/2012 "been stopped now ~ 8 month".  3 per day now.  . Alcohol Use: No    Review of Systems  All other systems reviewed and are negative.    Allergies  Bee venom; Flexeril; Hydrocodone; Ibuprofen; Nabumetone; [name]; Tramadol; Ace inhibitors; and Other  Home Medications   Prior to Admission medications   Medication Sig Start Date End Date Taking? Authorizing Provider  acetaminophen (TYLENOL) 500 MG tablet Take 1 tablet (500 mg total) by mouth every 6 (six) hours as needed. 02/13/16   Domenic Moras, PA-C  amLODipine (NORVASC) 5 MG tablet Take 5 mg by mouth daily.    Historical Provider, MD  EPINEPHrine (EPI-PEN) 0.3 mg/0.3 mL SOAJ Inject 0.3 mg into the muscle once as needed (Anaphylaxis).  03/09/13   Montine Circle, PA-C  methocarbamol (ROBAXIN) 500 MG tablet Take 1 tablet (500 mg total) by mouth 2 (two) times daily. 02/13/16   Domenic Moras, PA-C  nitroGLYCERIN (NITROSTAT) 0.4 MG SL tablet Place 0.4 mg under the tongue every 5 (five) minutes as needed for chest pain.  08/11/13   Trinda Pascal, MD  orphenadrine (NORFLEX) 100 MG tablet Take 1 tablet (100 mg total) by mouth 2 (two)  times daily. 05/24/15   Jenise V Bacon Menshew, PA-C  oxyCODONE-acetaminophen (PERCOCET) 5-325 MG per tablet Take 1 tablet by mouth every 4 (four) hours as needed for severe pain. Patient not taking: Reported on 04/25/2015 03/22/15   Jeannett Senior, PA-C  oxyCODONE-acetaminophen (PERCOCET) 5-325 MG per tablet Take 1 tablet by mouth every 4 (four) hours as needed for severe pain. Patient not taking: Reported on 08/22/2015 04/25/15   Tatyana Kirichenko, PA-C  predniSONE (DELTASONE) 20 MG tablet Take 2 tablets (40 mg total) by mouth daily. 02/13/16   Domenic Moras, PA-C  STRIBILD 150-150-200-300 MG TABS tablet TAKE 1 TABLET DAILY WITH BREAKFAST. 08/10/14   Michel Bickers, MD   BP 120/89 mmHg  Pulse 90  Temp(Src) 97.9 F (36.6 C) (Oral)  Resp 18  SpO2 97% Physical Exam  Constitutional: He is oriented to  person, place, and time. He appears well-developed and well-nourished. No distress.  HENT:  Head: Normocephalic.  Neck: Normal range of motion. Neck supple.  Pulmonary/Chest: Effort normal.  Musculoskeletal: Normal range of motion. He exhibits tenderness. He exhibits no edema.  No C, T, or L spine tenderness to palpation. No obvious signs of trauma, deformity, infection, step-offs. Lung expansion normal. No scoliosis or kyphosis. Bilateral lower extremity strength 5 out of 5, sensation grossly intact, patellar reflexes 2+, pedal pulses 2+, Refill less than 3 seconds.  Surgical scar on lumbar spine, minor tenderness to palpation of surrounding soft tissues  Straight leg negative Ambulates without difficulty  Neurological: He is alert and oriented to person, place, and time.  Skin: Skin is warm and dry. He is not diaphoretic.  Psychiatric: He has a normal mood and affect. His behavior is normal. Judgment and thought content normal.  Nursing note and vitals reviewed.   ED Course  Procedures (including critical care time) Labs Review Labs Reviewed - No data to display  Imaging Review No results found. I have personally reviewed and evaluated these images and lab results as part of my medical decision-making.   EKG Interpretation None      MDM   Final diagnoses:  Bilateral low back pain without sciatica    Labs:  Imaging:  Consults:  Therapeutics:  Discharge Meds:   Assessment/Plan: Patient presents and comp get it back pain. Chronic in nature with acute worsening likely due to increased activity. He has no red flags for back pain, ambulatory without difficulty. Patient will be given a dose pain medication here discharged home with symptomatic care instructions and specialist follow-up. Patient verbalized understanding and agreement today's plan had no further questions or concerns at time of discharge.        Okey Regal, PA-C 02/14/16 [place],  MD 02/24/16 [phone]

## 2016-02-14 NOTE — ED Notes (Signed)
PT returns today because of ongoing back pain. Pt reports he has an appt. In [place] on June 8. Pt ambulatory to room  With out assistance.

## 2016-02-14 NOTE — Discharge Instructions (Signed)
Back Pain, Adult °Back pain is very common in adults. The cause of back pain is rarely dangerous and the pain often gets better over time. The cause of your back pain may not be known. Some common causes of back pain include: °1. Strain of the muscles or ligaments supporting the spine. °2. Wear and tear (degeneration) of the spinal disks. °3. Arthritis. °4. Direct injury to the back. °For many people, back pain may return. Since back pain is rarely dangerous, most people can learn to manage this condition on their own. °HOME CARE INSTRUCTIONS °Watch your back pain for any changes. The following actions may help to lessen any discomfort you are feeling: °1. Remain active. It is stressful on your back to sit or stand in one place for long periods of time. Do not sit, drive, or stand in one place for more than 30 minutes at a time. Take short walks on even surfaces as soon as you are able. Try to increase the length of time you walk each day. °2. Exercise regularly as directed by your health care provider. Exercise helps your back heal faster. It also helps avoid future injury by keeping your muscles strong and flexible. °3. Do not stay in bed. Resting more than 1-2 days can delay your recovery. °4. Pay attention to your body when you bend and lift. The most comfortable positions are those that put less stress on your recovering back. Always use proper lifting techniques, including: °1. Bending your knees. °2. Keeping the load close to your body. °3. Avoiding twisting. °5. Find a comfortable position to sleep. Use a firm mattress and lie on your side with your knees slightly bent. If you lie on your back, put a pillow under your knees. °6. Avoid feeling anxious or stressed. Stress increases muscle tension and can worsen back pain. It is important to recognize when you are anxious or stressed and learn ways to manage it, such as with exercise. °7. Take medicines only as directed by your health care provider.  Over-the-counter medicines to reduce pain and inflammation are often the most helpful. Your health care provider may prescribe muscle relaxant drugs. These medicines help dull your pain so you can more quickly return to your normal activities and healthy exercise. °8. Apply ice to the injured area: °1. Put ice in a plastic bag. °2. Place a towel between your skin and the bag. °3. Leave the ice on for 20 minutes, 2-3 times a day for the first 2-3 days. After that, ice and heat may be alternated to reduce pain and spasms. °9. Maintain a healthy weight. Excess weight puts extra stress on your back and makes it difficult to maintain good posture. °SEEK MEDICAL CARE IF: °1. You have pain that is not relieved with rest or medicine. °2. You have increasing pain going down into the legs or buttocks. °3. You have pain that does not improve in one week. °4. You have night pain. °5. You lose weight. °6. You have a fever or chills. °SEEK IMMEDIATE MEDICAL CARE IF:  °1. You develop new bowel or bladder control problems. °2. You have unusual weakness or numbness in your arms or legs. °3. You develop nausea or vomiting. °4. You develop abdominal pain. °5. You feel faint. °  °This information is not intended to replace advice given to you by your health care provider. Make sure you discuss any questions you have with your health care provider. °  °Document Released: 09/04/2005 Document Revised: 09/25/2014 Document Reviewed: 01/06/2014 °Elsevier Interactive Patient Education ©2016 Elsevier   Inc[employer] ° °Back Exercises °The following exercises strengthen the muscles that help to support the back. They also help to keep the lower back flexible. Doing these exercises can help to prevent back pain or lessen existing pain. °If you have back pain or discomfort, try doing these exercises 2-3 times each day or as told by your health care provider. When the pain goes away, do them once each day, but increase the number of times that you repeat the  steps for each exercise (do more repetitions). If you do not have back pain or discomfort, do these exercises once each day or as told by your health care provider. °EXERCISES °Single Knee to Chest °Repeat these steps 3-5 times for each leg: °5. Lie on your back on a firm bed or the floor with your legs extended. °6. Bring one knee to your chest. Your other leg should stay extended and in contact with the floor. °7. Hold your knee in place by grabbing your knee or thigh. °8. Pull on your knee until you feel a gentle stretch in your lower back. °9. Hold the stretch for 10-30 seconds. °10. Slowly release and straighten your leg. °Pelvic Tilt °Repeat these steps 5-10 times: °10. Lie on your back on a firm bed or the floor with your legs extended. °11. Bend your knees so they are pointing toward the ceiling and your feet are flat on the floor. °12. Tighten your lower abdominal muscles to press your lower back against the floor. This motion will tilt your pelvis so your tailbone points up toward the ceiling instead of pointing to your feet or the floor. °13. With gentle tension and even breathing, hold this position for 5-10 seconds. °Cat-Cow °Repeat these steps until your lower back becomes more flexible: °7. Get into a hands-and-knees position on a firm surface. Keep your hands under your shoulders, and keep your knees under your hips. You may place padding under your knees for comfort. °8. Let your head hang down, and point your tailbone toward the floor so your lower back becomes rounded like the back of a cat. °9. Hold this position for 5 seconds. °10. Slowly lift your head and point your tailbone up toward the ceiling so your back forms a sagging arch like the back of a cow. °11. Hold this position for 5 seconds. °Press-Ups °Repeat these steps 5-10 times: °6. Lie on your abdomen (face-down) on the floor. °7. Place your palms near your head, about shoulder-width apart. °8. While you keep your back as relaxed as  possible and keep your hips on the floor, slowly straighten your arms to raise the top half of your body and lift your shoulders. Do not use your back muscles to raise your upper torso. You may adjust the placement of your hands to make yourself more comfortable. °9. Hold this position for 5 seconds while you keep your back relaxed. °10. Slowly return to lying flat on the floor. °Bridges °Repeat these steps 10 times: °1. Lie on your back on a firm surface. °2. Bend your knees so they are pointing toward the ceiling and your feet are flat on the floor. °3. Tighten your buttocks muscles and lift your buttocks off of the floor until your waist is at almost the same height as your knees. You should feel the muscles working in your buttocks and the back of your thighs. If you do not feel these muscles, slide your feet 1-2 inches farther away from your buttocks. °4. Hold this   position for 3-5 seconds. °5. Slowly lower your hips to the starting position, and allow your buttocks muscles to relax completely. °If this exercise is too easy, try doing it with your arms crossed over your chest. °Abdominal Crunches °Repeat these steps 5-10 times: °1. Lie on your back on a firm bed or the floor with your legs extended. °2. Bend your knees so they are pointing toward the ceiling and your feet are flat on the floor. °3. Cross your arms over your chest. °4. Tip your chin slightly toward your chest without bending your neck. °5. Tighten your abdominal muscles and slowly raise your trunk (torso) high enough to lift your shoulder blades a tiny bit off of the floor. Avoid raising your torso higher than that, because it can put too much stress on your low back and it does not help to strengthen your abdominal muscles. °6. Slowly return to your starting position. °Back Lifts °Repeat these steps 5-10 times: °1. Lie on your abdomen (face-down) with your arms at your sides, and rest your forehead on the floor. °2. Tighten the muscles in your  legs and your buttocks. °3. Slowly lift your chest off of the floor while you keep your hips pressed to the floor. Keep the back of your head in line with the curve in your back. Your eyes should be looking at the floor. °4. Hold this position for 3-5 seconds. °5. Slowly return to your starting position. °SEEK MEDICAL CARE IF: °· Your back pain or discomfort gets much worse when you do an exercise. °· Your back pain or discomfort does not lessen within 2 hours after you exercise. °If you have any of these problems, stop doing these exercises right away. Do not do them again unless your health care provider says that you can. °SEEK IMMEDIATE MEDICAL CARE IF: °· You develop sudden, severe back pain. If this happens, stop doing the exercises right away. Do not do them again unless your health care provider says that you can. °  °This information is not intended to replace advice given to you by your health care provider. Make sure you discuss any questions you have with your health care provider. °  °Document Released: 10/12/2004 Document Revised: 05/26/2015 Document Reviewed: 10/29/2014 °Elsevier Interactive Patient Education ©2016 Elsevier Inc. ° °

## 2016-10-16 DIAGNOSIS — F17200 Nicotine dependence, unspecified, uncomplicated: Secondary | ICD-10-CM | POA: Insufficient documentation

## 2016-11-11 ENCOUNTER — Emergency Department (HOSPITAL_COMMUNITY): Payer: Medicare Other

## 2016-11-11 ENCOUNTER — Emergency Department (HOSPITAL_COMMUNITY)
Admission: EM | Admit: 2016-11-11 | Discharge: 2016-11-11 | Disposition: A | Discharge status: Home / Self Care | Payer: Medicare Other | Attending: Emergency Medicine | Admitting: Emergency Medicine

## 2016-11-11 DIAGNOSIS — J449 Chronic obstructive pulmonary disease, unspecified: Secondary | ICD-10-CM | POA: Insufficient documentation

## 2016-11-11 DIAGNOSIS — I509 Heart failure, unspecified: Secondary | ICD-10-CM | POA: Insufficient documentation

## 2016-11-11 DIAGNOSIS — I2510 Atherosclerotic heart disease of native coronary artery without angina pectoris: Secondary | ICD-10-CM | POA: Insufficient documentation

## 2016-11-11 DIAGNOSIS — I110 Hypertensive heart disease with heart failure: Secondary | ICD-10-CM | POA: Diagnosis not present

## 2016-11-11 DIAGNOSIS — F17210 Nicotine dependence, cigarettes, uncomplicated: Secondary | ICD-10-CM | POA: Diagnosis not present

## 2016-11-11 DIAGNOSIS — M25512 Pain in left shoulder: Secondary | ICD-10-CM | POA: Diagnosis not present

## 2016-11-11 DIAGNOSIS — Z79899 Other long term (current) drug therapy: Secondary | ICD-10-CM | POA: Diagnosis not present

## 2016-11-11 LAB — CBC WITH DIFFERENTIAL/PLATELET
Basophils Absolute: 0 10*3/uL (ref 0.0–0.1)
Basophils Relative: 1 %
Eosinophils Absolute: 0 10*3/uL (ref 0.0–0.7)
Eosinophils Relative: 1 %
HCT: 39.8 % (ref 39.0–52.0)
Hemoglobin: 13.2 g/dL (ref 13.0–17.0)
Lymphocytes Relative: 36 %
Lymphs Abs: 2.3 10*3/uL (ref 0.7–4.0)
MCH: 26.6 pg (ref 26.0–34.0)
MCHC: 33.2 g/dL (ref 30.0–36.0)
MCV: 80.1 fL (ref 78.0–100.0)
Monocytes Absolute: 0.5 10*3/uL (ref 0.1–1.0)
Monocytes Relative: 8 %
Neutro Abs: 3.4 10*3/uL (ref 1.7–7.7)
Neutrophils Relative %: 54 %
Platelets: 255 10*3/uL (ref 150–400)
RBC: 4.97 MIL/uL (ref 4.22–5.81)
RDW: 15.1 % (ref 11.5–15.5)
WBC: 6.2 10*3/uL (ref 4.0–10.5)

## 2016-11-11 LAB — SEDIMENTATION RATE: Sed Rate: 20 mm/hr — ABNORMAL HIGH (ref 0–16)

## 2016-11-11 LAB — BASIC METABOLIC PANEL
Anion gap: 7 (ref 5–15)
BUN: 16 mg/dL (ref 6–20)
CO2: 25 mmol/L (ref 22–32)
Calcium: 9 mg/dL (ref 8.9–10.3)
Chloride: 105 mmol/L (ref 101–111)
Creatinine, Ser: 0.92 mg/dL (ref 0.61–1.24)
GFR calc Af Amer: >60 mL/min (ref >60)
GFR calc non Af Amer: >60 mL/min (ref >60)
Glucose, Bld: 88 mg/dL (ref 65–99)
Potassium: 3.2 mmol/L — ABNORMAL LOW (ref 3.5–5.1)
Sodium: 137 mmol/L (ref 135–145)

## 2016-11-11 MED ORDER — OXYCODONE-ACETAMINOPHEN 5-325 MG PO TABS: 1.0000 | ORAL_TABLET | Freq: Three times a day (TID) | ORAL | 0 refills | Status: DC | PRN

## 2016-11-11 MED ORDER — OXYCODONE-ACETAMINOPHEN 5-325 MG PO TABS
1.0000 | ORAL_TABLET | Freq: Once | ORAL | Status: AC
  Administered 2016-11-11: 1 via ORAL
  Filled 2016-11-11: qty 1

## 2016-11-11 NOTE — ED Triage Notes (Addendum)
Patient complaining of right shoulder pain that is getting worse. Patient states he can not lay on right shoulder or it throbs and keeps him up. Patient states that he has not had any injury to the right shoulder.

## 2016-11-11 NOTE — Discharge Instructions (Signed)
Xrays and lab results are normal. Please see the Orthopedist soon.
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
## 2016-11-11 NOTE — ED Notes (Signed)
Pt states moderate pain to shoulder, requesting pain medicine.

## 2017-05-13 ENCOUNTER — Emergency Department (HOSPITAL_COMMUNITY)
Admission: EM | Admit: 2017-05-13 | Discharge: 2017-05-13 | Disposition: A | Discharge status: Home / Self Care | Payer: Medicare Other | Attending: Emergency Medicine | Admitting: Emergency Medicine

## 2017-05-13 ENCOUNTER — Encounter (HOSPITAL_COMMUNITY): Payer: Self-pay | Admitting: Emergency Medicine

## 2017-05-13 DIAGNOSIS — S39012A Strain of muscle, fascia and tendon of lower back, initial encounter: Secondary | ICD-10-CM | POA: Insufficient documentation

## 2017-05-13 DIAGNOSIS — F17210 Nicotine dependence, cigarettes, uncomplicated: Secondary | ICD-10-CM | POA: Diagnosis not present

## 2017-05-13 DIAGNOSIS — M545 Low back pain, unspecified: Secondary | ICD-10-CM

## 2017-05-13 DIAGNOSIS — Y998 Other external cause status: Secondary | ICD-10-CM | POA: Diagnosis not present

## 2017-05-13 DIAGNOSIS — Z79899 Other long term (current) drug therapy: Secondary | ICD-10-CM | POA: Diagnosis not present

## 2017-05-13 DIAGNOSIS — I509 Heart failure, unspecified: Secondary | ICD-10-CM | POA: Diagnosis not present

## 2017-05-13 DIAGNOSIS — I2510 Atherosclerotic heart disease of native coronary artery without angina pectoris: Secondary | ICD-10-CM | POA: Insufficient documentation

## 2017-05-13 DIAGNOSIS — Y9389 Activity, other specified: Secondary | ICD-10-CM | POA: Insufficient documentation

## 2017-05-13 DIAGNOSIS — G8929 Other chronic pain: Secondary | ICD-10-CM

## 2017-05-13 DIAGNOSIS — J45909 Unspecified asthma, uncomplicated: Secondary | ICD-10-CM | POA: Diagnosis not present

## 2017-05-13 DIAGNOSIS — I110 Hypertensive heart disease with heart failure: Secondary | ICD-10-CM | POA: Diagnosis not present

## 2017-05-13 DIAGNOSIS — S3992XA Unspecified injury of lower back, initial encounter: Secondary | ICD-10-CM | POA: Diagnosis present

## 2017-05-13 DIAGNOSIS — X500XXA Overexertion from strenuous movement or load, initial encounter: Secondary | ICD-10-CM | POA: Insufficient documentation

## 2017-05-13 DIAGNOSIS — J449 Chronic obstructive pulmonary disease, unspecified: Secondary | ICD-10-CM | POA: Diagnosis not present

## 2017-05-13 DIAGNOSIS — Y929 Unspecified place or not applicable: Secondary | ICD-10-CM | POA: Insufficient documentation

## 2017-05-13 DIAGNOSIS — M62830 Muscle spasm of back: Secondary | ICD-10-CM | POA: Insufficient documentation

## 2017-05-13 MED ORDER — MELOXICAM 7.5 MG PO TABS: 7.5000 mg | ORAL_TABLET | Freq: Every day | ORAL | 0 refills | Status: DC

## 2017-05-13 MED ORDER — OXYCODONE-ACETAMINOPHEN 5-325 MG PO TABS: 1.0000 | ORAL_TABLET | Freq: Four times a day (QID) | ORAL | 0 refills | Status: DC | PRN

## 2017-05-13 MED ORDER — OXYCODONE-ACETAMINOPHEN 5-325 MG PO TABS
1.0000 | ORAL_TABLET | Freq: Once | ORAL | Status: AC
  Administered 2017-05-13: 1 via ORAL
  Filled 2017-05-13: qty 1

## 2017-05-13 MED ORDER — KETOROLAC TROMETHAMINE 30 MG/ML IJ SOLN
15.0000 mg | Freq: Once | INTRAMUSCULAR | Status: AC
  Administered 2017-05-13: 15 mg via INTRAMUSCULAR
  Filled 2017-05-13: qty 1

## 2017-05-13 MED ORDER — TIZANIDINE HCL 2 MG PO TABS: 2.0000 mg | ORAL_TABLET | Freq: Three times a day (TID) | ORAL | 0 refills | Status: DC | PRN

## 2017-05-13 MED ORDER — TIZANIDINE HCL 4 MG PO TABS
2.0000 mg | ORAL_TABLET | Freq: Once | ORAL | Status: AC
  Administered 2017-05-13: 2 mg via ORAL
  Filled 2017-05-13: qty 1

## 2017-05-13 NOTE — Discharge Instructions (Signed)
Back Pain: Your back pain should get better over the next 2-4 weeks.  However if you develop severe or worsening pain, low back pain with fever, numbness, weakness or inability to walk or urinate, you should return to the ER immediately.  Please follow up with your doctor this week for a recheck if still having symptoms.  Avoid heavy lifting over 10 pounds over the next two weeks.  Low back pain is discomfort in the lower back that may be due to injuries to muscles and ligaments around the spine.  Occasionally, it may be caused by a a problem to a part of the spine called a disc.  The pain may last several days or a week;  However, most patients get completely well in 4 weeks.  Self - care:  The application of heat can help soothe the pain.  Maintaining your daily activities, including walking, is encourged, as it will help you get better faster than just staying in bed. Perform gentle stretching as discussed. Drink plenty of fluids.  Medications are also useful to help with pain control.  A commonly prescribed medication includes percocet.  Do not drive or operate heavy machinery while taking this medication.  Non steroidal anti inflammatory medications including mobic:  These medications help both pain and swelling and are very useful in treating back pain.  They should be taken with food, as they can cause stomach upset, and more seriously, stomach bleeding.  Use mobic daily as directed, starting tomorrow, but don't use additional NSAIDs like ibuprofen/aleve/etc while taking mobic. Use additional tylenol as needed for additional pain relief.    Muscle relaxants:  These medications can help with muscle tightness that is a cause of lower back pain.  Most of these medications can cause drowsiness, and it is not safe to drive or use dangerous machinery while taking them.  SEEK IMMEDIATE MEDICAL ATTENTION IF: New numbness, tingling, weakness, or problem with the use of your arms or legs.  Severe back  pain not relieved with medications.  Difficulty with or loss of control of your bowel or bladder control.  Increasing pain in any areas of the body (such as chest or abdominal pain).  Shortness of breath, dizziness or fainting.  Nausea (feeling sick to your stomach), vomiting, fever, or sweats.  You will need to follow up with  Your primary healthcare provider in 1 week for reassessment.

## 2017-05-13 NOTE — ED Triage Notes (Signed)
Pt c/o lower back pain. States he was lifting heavy objects yesterday while helping daughter move and injured his back. C/o muscle tightness with walking, has tried treatment at home with no relief.
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
## 2017-06-10 ENCOUNTER — Emergency Department (HOSPITAL_COMMUNITY)
Admission: EM | Admit: 2017-06-10 | Discharge: 2017-06-10 | Disposition: A | Discharge status: Home / Self Care | Payer: Medicare Other | Attending: Emergency Medicine | Admitting: Emergency Medicine

## 2017-06-10 ENCOUNTER — Encounter (HOSPITAL_COMMUNITY): Payer: Self-pay

## 2017-06-10 DIAGNOSIS — J449 Chronic obstructive pulmonary disease, unspecified: Secondary | ICD-10-CM | POA: Diagnosis not present

## 2017-06-10 DIAGNOSIS — Z91030 Bee allergy status: Secondary | ICD-10-CM | POA: Insufficient documentation

## 2017-06-10 DIAGNOSIS — B350 Tinea barbae and tinea capitis: Secondary | ICD-10-CM | POA: Insufficient documentation

## 2017-06-10 DIAGNOSIS — J45909 Unspecified asthma, uncomplicated: Secondary | ICD-10-CM | POA: Diagnosis not present

## 2017-06-10 DIAGNOSIS — I110 Hypertensive heart disease with heart failure: Secondary | ICD-10-CM | POA: Diagnosis not present

## 2017-06-10 DIAGNOSIS — I249 Acute ischemic heart disease, unspecified: Secondary | ICD-10-CM | POA: Diagnosis not present

## 2017-06-10 DIAGNOSIS — M545 Low back pain, unspecified: Secondary | ICD-10-CM

## 2017-06-10 DIAGNOSIS — B20 Human immunodeficiency virus [HIV] disease: Secondary | ICD-10-CM | POA: Diagnosis not present

## 2017-06-10 DIAGNOSIS — I509 Heart failure, unspecified: Secondary | ICD-10-CM | POA: Insufficient documentation

## 2017-06-10 DIAGNOSIS — Z79899 Other long term (current) drug therapy: Secondary | ICD-10-CM | POA: Insufficient documentation

## 2017-06-10 DIAGNOSIS — G8929 Other chronic pain: Secondary | ICD-10-CM

## 2017-06-10 DIAGNOSIS — F17210 Nicotine dependence, cigarettes, uncomplicated: Secondary | ICD-10-CM | POA: Insufficient documentation

## 2017-06-10 MED ORDER — ACETAMINOPHEN 500 MG PO TABS: 1000.0000 mg | ORAL_TABLET | Freq: Once | ORAL | Status: DC

## 2017-06-10 MED ORDER — GRISEOFULVIN MICROSIZE 500 MG PO TABS: 500.0000 mg | ORAL_TABLET | Freq: Every day | ORAL | 0 refills | Status: DC

## 2017-06-10 MED ORDER — OXYCODONE HCL 5 MG PO TABS: 5.0000 mg | ORAL_TABLET | Freq: Once | ORAL | Status: DC

## 2017-06-10 NOTE — ED Triage Notes (Signed)
Pt presents for evaluation of lower back pain starting last night. Pt also reports has new "knot" developing to L side of scalp that was not present before. Has a large "knot" to posterior head as well, states area is painful. Pt denies fall, denies use of blood thinner. AxO x4.

## 2017-06-10 NOTE — Discharge Instructions (Signed)
Call your infectious disease doc in the morning.  Return for worsening symptoms.

## 2017-06-10 NOTE — ED Notes (Signed)
Pt stable, ambulatory, and verbalizes understanding of discharge instructions.  
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
## 2017-06-25 ENCOUNTER — Encounter (HOSPITAL_COMMUNITY): Payer: Self-pay | Admitting: Emergency Medicine

## 2017-06-25 ENCOUNTER — Emergency Department (HOSPITAL_COMMUNITY): Payer: Medicare Other

## 2017-06-25 ENCOUNTER — Emergency Department (HOSPITAL_COMMUNITY)
Admission: EM | Admit: 2017-06-25 | Discharge: 2017-06-25 | Disposition: A | Discharge status: Home / Self Care | Payer: Medicare Other | Attending: Emergency Medicine | Admitting: Emergency Medicine

## 2017-06-25 DIAGNOSIS — I110 Hypertensive heart disease with heart failure: Secondary | ICD-10-CM | POA: Insufficient documentation

## 2017-06-25 DIAGNOSIS — I2510 Atherosclerotic heart disease of native coronary artery without angina pectoris: Secondary | ICD-10-CM | POA: Diagnosis not present

## 2017-06-25 DIAGNOSIS — M545 Low back pain: Secondary | ICD-10-CM | POA: Insufficient documentation

## 2017-06-25 DIAGNOSIS — B20 Human immunodeficiency virus [HIV] disease: Secondary | ICD-10-CM | POA: Diagnosis not present

## 2017-06-25 DIAGNOSIS — M542 Cervicalgia: Secondary | ICD-10-CM | POA: Diagnosis not present

## 2017-06-25 DIAGNOSIS — Z79899 Other long term (current) drug therapy: Secondary | ICD-10-CM | POA: Insufficient documentation

## 2017-06-25 DIAGNOSIS — W19XXXA Unspecified fall, initial encounter: Secondary | ICD-10-CM

## 2017-06-25 DIAGNOSIS — F17210 Nicotine dependence, cigarettes, uncomplicated: Secondary | ICD-10-CM | POA: Diagnosis not present

## 2017-06-25 DIAGNOSIS — I509 Heart failure, unspecified: Secondary | ICD-10-CM | POA: Diagnosis not present

## 2017-06-25 MED ORDER — OXYCODONE-ACETAMINOPHEN 5-325 MG PO TABS: 1.0000 | ORAL_TABLET | Freq: Four times a day (QID) | ORAL | 0 refills | Status: DC | PRN

## 2017-06-25 MED ORDER — HYDROMORPHONE HCL 1 MG/ML IJ SOLN
1.0000 mg | Freq: Once | INTRAMUSCULAR | Status: AC
  Administered 2017-06-25: 1 mg via INTRAMUSCULAR
  Filled 2017-06-25: qty 1

## 2017-06-25 NOTE — Discharge Instructions (Signed)
Follow up with your md if problems °

## 2017-06-25 NOTE — ED Triage Notes (Signed)
Patient reports that his legs got weak this morning causing him to fall. Patient c/o lower back pain that radiates up his back.
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
## 2017-06-25 NOTE — ED Notes (Signed)
Pt ambulatory and independent at discharge.  Verbalized understanding of discharge instructions 

## 2017-06-29 ENCOUNTER — Encounter (HOSPITAL_COMMUNITY): Payer: Self-pay | Admitting: Emergency Medicine

## 2017-06-29 ENCOUNTER — Emergency Department (HOSPITAL_COMMUNITY)
Admission: EM | Admit: 2017-06-29 | Discharge: 2017-06-29 | Disposition: A | Discharge status: Home / Self Care | Payer: Medicare Other | Attending: Emergency Medicine | Admitting: Emergency Medicine

## 2017-06-29 DIAGNOSIS — W109XXA Fall (on) (from) unspecified stairs and steps, initial encounter: Secondary | ICD-10-CM | POA: Diagnosis not present

## 2017-06-29 DIAGNOSIS — J449 Chronic obstructive pulmonary disease, unspecified: Secondary | ICD-10-CM | POA: Insufficient documentation

## 2017-06-29 DIAGNOSIS — Z79899 Other long term (current) drug therapy: Secondary | ICD-10-CM | POA: Insufficient documentation

## 2017-06-29 DIAGNOSIS — I110 Hypertensive heart disease with heart failure: Secondary | ICD-10-CM | POA: Insufficient documentation

## 2017-06-29 DIAGNOSIS — I2510 Atherosclerotic heart disease of native coronary artery without angina pectoris: Secondary | ICD-10-CM | POA: Insufficient documentation

## 2017-06-29 DIAGNOSIS — M542 Cervicalgia: Secondary | ICD-10-CM | POA: Diagnosis not present

## 2017-06-29 DIAGNOSIS — M549 Dorsalgia, unspecified: Secondary | ICD-10-CM | POA: Insufficient documentation

## 2017-06-29 DIAGNOSIS — F17210 Nicotine dependence, cigarettes, uncomplicated: Secondary | ICD-10-CM | POA: Diagnosis not present

## 2017-06-29 DIAGNOSIS — Y999 Unspecified external cause status: Secondary | ICD-10-CM | POA: Diagnosis not present

## 2017-06-29 DIAGNOSIS — Y9389 Activity, other specified: Secondary | ICD-10-CM | POA: Diagnosis not present

## 2017-06-29 DIAGNOSIS — M7918 Myalgia, other site: Secondary | ICD-10-CM

## 2017-06-29 DIAGNOSIS — J45909 Unspecified asthma, uncomplicated: Secondary | ICD-10-CM | POA: Insufficient documentation

## 2017-06-29 DIAGNOSIS — I509 Heart failure, unspecified: Secondary | ICD-10-CM | POA: Insufficient documentation

## 2017-06-29 DIAGNOSIS — Y929 Unspecified place or not applicable: Secondary | ICD-10-CM | POA: Diagnosis not present

## 2017-06-29 MED ORDER — KETOROLAC TROMETHAMINE 15 MG/ML IJ SOLN
7.5000 mg | Freq: Once | INTRAMUSCULAR | Status: AC
  Administered 2017-06-29: 7.5 mg via INTRAMUSCULAR
  Filled 2017-06-29: qty 1

## 2017-06-29 MED ORDER — TIZANIDINE HCL 2 MG PO TABS: 2.0000 mg | ORAL_TABLET | Freq: Two times a day (BID) | ORAL | 0 refills | Status: DC | PRN

## 2017-06-29 MED ORDER — IBUPROFEN 600 MG PO TABS: 600.0000 mg | ORAL_TABLET | Freq: Two times a day (BID) | ORAL | 0 refills | Status: DC

## 2017-06-29 MED ORDER — NAPROXEN 375 MG PO TABS: 375.0000 mg | ORAL_TABLET | Freq: Two times a day (BID) | ORAL | 0 refills | Status: DC
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
## 2017-06-29 NOTE — ED Triage Notes (Signed)
Patient c/o neck and back pain after fall on Monday. Denies head injury and LOC. Seen for same on Monday but reports he is out of medication. States pain worsens with movement. Denies bowel and bladder changes.

## 2017-06-29 NOTE — Discharge Instructions (Signed)
Take naproxen twice a day with meals. Do not take other anti-inflammatories at the same time (Advil, Motrin, ibuprofen, Aleve). You may take Tylenol if you need further pain control. Use Zanaflex as needed for muscle stiffness or soreness. Have caution, as this may make you tired. Do not operate heavy machinery or drive until you know how this affects you. You may use ice or heat if this helps control your symptoms. Continue to do gentle exercise, do not stay seated or lying for too long, as this will worsen your symptoms.  Follow-up with your primary care doctor as scheduled. Return to the emergency room if you develop worsening pain, numbness, inability to walk, loss of bowel or bladder control, or any new or worsening symptoms.

## 2017-07-01 ENCOUNTER — Emergency Department (HOSPITAL_COMMUNITY)
Admission: EM | Admit: 2017-07-01 | Discharge: 2017-07-01 | Disposition: A | Discharge status: Home / Self Care | Payer: Medicare Other | Attending: Emergency Medicine | Admitting: Emergency Medicine

## 2017-07-01 ENCOUNTER — Encounter (HOSPITAL_COMMUNITY): Payer: Self-pay

## 2017-07-01 DIAGNOSIS — I110 Hypertensive heart disease with heart failure: Secondary | ICD-10-CM | POA: Insufficient documentation

## 2017-07-01 DIAGNOSIS — W108XXA Fall (on) (from) other stairs and steps, initial encounter: Secondary | ICD-10-CM | POA: Diagnosis not present

## 2017-07-01 DIAGNOSIS — S300XXD Contusion of lower back and pelvis, subsequent encounter: Secondary | ICD-10-CM

## 2017-07-01 DIAGNOSIS — I509 Heart failure, unspecified: Secondary | ICD-10-CM | POA: Insufficient documentation

## 2017-07-01 DIAGNOSIS — S300XXA Contusion of lower back and pelvis, initial encounter: Secondary | ICD-10-CM | POA: Insufficient documentation

## 2017-07-01 DIAGNOSIS — F17210 Nicotine dependence, cigarettes, uncomplicated: Secondary | ICD-10-CM | POA: Diagnosis not present

## 2017-07-01 DIAGNOSIS — Y929 Unspecified place or not applicable: Secondary | ICD-10-CM | POA: Insufficient documentation

## 2017-07-01 DIAGNOSIS — Y939 Activity, unspecified: Secondary | ICD-10-CM | POA: Diagnosis not present

## 2017-07-01 DIAGNOSIS — I2510 Atherosclerotic heart disease of native coronary artery without angina pectoris: Secondary | ICD-10-CM | POA: Insufficient documentation

## 2017-07-01 DIAGNOSIS — Z79899 Other long term (current) drug therapy: Secondary | ICD-10-CM | POA: Diagnosis not present

## 2017-07-01 DIAGNOSIS — Y999 Unspecified external cause status: Secondary | ICD-10-CM | POA: Diagnosis not present

## 2017-07-01 MED ORDER — DIPHENHYDRAMINE HCL 25 MG PO CAPS
50.0000 mg | ORAL_CAPSULE | Freq: Once | ORAL | Status: AC
  Administered 2017-07-01: 50 mg via ORAL
  Filled 2017-07-01: qty 2

## 2017-07-01 MED ORDER — FAMOTIDINE 20 MG PO TABS
20.0000 mg | ORAL_TABLET | Freq: Once | ORAL | Status: AC
Start: 2017-07-01 — End: 2017-07-01
  Administered 2017-07-01: 20 mg via ORAL
  Filled 2017-07-01: qty 1

## 2017-07-01 MED ORDER — FAMOTIDINE 20 MG PO TABS: 20.0000 mg | ORAL_TABLET | Freq: Two times a day (BID) | ORAL | 0 refills | Status: DC

## 2017-07-01 MED ORDER — OXYCODONE-ACETAMINOPHEN 5-325 MG PO TABS
1.0000 | ORAL_TABLET | Freq: Three times a day (TID) | ORAL | 0 refills | Status: DC | PRN
Start: 2017-07-01 — End: 2017-08-01

## 2017-07-01 MED ORDER — DIPHENHYDRAMINE HCL 25 MG PO CAPS: 25.0000 mg | ORAL_CAPSULE | Freq: Four times a day (QID) | ORAL | 0 refills | Status: DC | PRN

## 2017-07-01 MED ORDER — METHOCARBAMOL 500 MG PO TABS
500.0000 mg | ORAL_TABLET | Freq: Once | ORAL | Status: AC
  Administered 2017-07-01: 500 mg via ORAL
  Filled 2017-07-01: qty 1

## 2017-07-01 MED ORDER — OXYCODONE-ACETAMINOPHEN 5-325 MG PO TABS
1.0000 | ORAL_TABLET | Freq: Once | ORAL | Status: AC
  Administered 2017-07-01: 1 via ORAL
  Filled 2017-07-01: qty 1
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
## 2017-07-01 NOTE — ED Triage Notes (Signed)
Patient states he slipped down 4-5 steps 6 days ago. Patient c/o posterior neck pain,  upper, mid, and lower back pain. Patient denies hitting his head or having LOC.

## 2017-07-01 NOTE — Discharge Instructions (Signed)
Follow up with your doctor in 2 days for recheck. Return here as needed.

## 2017-07-29 ENCOUNTER — Emergency Department (HOSPITAL_COMMUNITY)
Admission: EM | Admit: 2017-07-29 | Discharge: 2017-07-29 | Disposition: A | Discharge status: Home / Self Care | Payer: Medicare Other | Attending: Emergency Medicine | Admitting: Emergency Medicine

## 2017-07-29 ENCOUNTER — Emergency Department (HOSPITAL_COMMUNITY): Payer: Medicare Other

## 2017-07-29 ENCOUNTER — Encounter (HOSPITAL_COMMUNITY): Payer: Self-pay | Admitting: Emergency Medicine

## 2017-07-29 DIAGNOSIS — I2510 Atherosclerotic heart disease of native coronary artery without angina pectoris: Secondary | ICD-10-CM | POA: Diagnosis not present

## 2017-07-29 DIAGNOSIS — X500XXA Overexertion from strenuous movement or load, initial encounter: Secondary | ICD-10-CM | POA: Diagnosis not present

## 2017-07-29 DIAGNOSIS — M25512 Pain in left shoulder: Secondary | ICD-10-CM | POA: Diagnosis present

## 2017-07-29 DIAGNOSIS — Z79899 Other long term (current) drug therapy: Secondary | ICD-10-CM | POA: Insufficient documentation

## 2017-07-29 DIAGNOSIS — Y929 Unspecified place or not applicable: Secondary | ICD-10-CM | POA: Diagnosis not present

## 2017-07-29 DIAGNOSIS — G8929 Other chronic pain: Secondary | ICD-10-CM | POA: Diagnosis not present

## 2017-07-29 DIAGNOSIS — B20 Human immunodeficiency virus [HIV] disease: Secondary | ICD-10-CM | POA: Insufficient documentation

## 2017-07-29 DIAGNOSIS — I110 Hypertensive heart disease with heart failure: Secondary | ICD-10-CM | POA: Diagnosis not present

## 2017-07-29 DIAGNOSIS — J45909 Unspecified asthma, uncomplicated: Secondary | ICD-10-CM | POA: Diagnosis not present

## 2017-07-29 DIAGNOSIS — Y9389 Activity, other specified: Secondary | ICD-10-CM | POA: Diagnosis not present

## 2017-07-29 DIAGNOSIS — Y999 Unspecified external cause status: Secondary | ICD-10-CM | POA: Insufficient documentation

## 2017-07-29 DIAGNOSIS — J449 Chronic obstructive pulmonary disease, unspecified: Secondary | ICD-10-CM | POA: Insufficient documentation

## 2017-07-29 DIAGNOSIS — F17210 Nicotine dependence, cigarettes, uncomplicated: Secondary | ICD-10-CM | POA: Insufficient documentation

## 2017-07-29 DIAGNOSIS — I509 Heart failure, unspecified: Secondary | ICD-10-CM | POA: Insufficient documentation

## 2017-07-29 MED ORDER — DIPHENHYDRAMINE HCL 25 MG PO CAPS
25.0000 mg | ORAL_CAPSULE | Freq: Once | ORAL | Status: AC
  Administered 2017-07-29: 25 mg via ORAL
  Filled 2017-07-29: qty 1

## 2017-07-29 MED ORDER — ACETAMINOPHEN 500 MG PO TABS
1000.0000 mg | ORAL_TABLET | Freq: Once | ORAL | Status: AC
  Administered 2017-07-29: 1000 mg via ORAL
  Filled 2017-07-29: qty 2

## 2017-07-29 MED ORDER — METHOCARBAMOL 500 MG PO TABS
500.0000 mg | ORAL_TABLET | Freq: Once | ORAL | Status: AC
Start: 2017-07-29 — End: 2017-07-29
  Administered 2017-07-29: 500 mg via ORAL
  Filled 2017-07-29: qty 1

## 2017-07-29 NOTE — Discharge Instructions (Signed)
Please read and follow all provided instructions.  You have been seen today for left shoulder pain  Tests performed today include: An x-ray of the affected area - does NOT show any broken bones or dislocations.  Vital signs. See below for your results today.   Home care instructions: -- *PRICE in the first 24-48 hours after injury: Protect (with brace, splint, sling), if given by your provider -please take your arm out of the shoulder sling several times per day and perform shoulder range of motion exercises provided to prevent something called frozen shoulder. Rest Ice- Do not apply ice pack directly to your skin, place towel or similar between your skin and ice/ice pack. Apply ice for 20 min, then remove for 40 min while awake Compression- Wear brace, elastic bandage, splint as directed by your provider Elevate affected extremity above the level of your heart when not walking around for the first 24-48 hours   Use 1000mg  Tylenol every 6 hours as needed for pain. Do not exceed this. Do not combine with other medications that have Tylenol in it. Do not have drink while using tylenol.   Follow-up instructions: Please follow-up with provided orthopedic physician (bone specialist) for further care.   Return instructions:  Please return if your toes or feet are numb or tingling, appear gray or blue, or you have severe pain (also elevate the leg and loosen splint or wrap if you were given one) Please return to the Emergency Department if you experience worsening symptoms.  Please return if you have any other emergent concerns. Additional Information:  Your vital signs today were: BP (!) 160/107    Pulse 90    Temp 97.7 F (36.5 C) (Oral)    Resp 16    SpO2 95%  If your blood pressure (BP) was elevated above 135/85 this visit, please have this repeated by your doctor within one month. ---------------
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
## 2017-07-29 NOTE — ED Triage Notes (Signed)
Pt reports he injured his L shoulder when he tried to catch a heavy object that was falling. Limited ROM

## 2017-08-01 ENCOUNTER — Other Ambulatory Visit: Payer: Self-pay

## 2017-08-01 ENCOUNTER — Emergency Department (HOSPITAL_COMMUNITY): Payer: No Typology Code available for payment source

## 2017-08-01 ENCOUNTER — Encounter (HOSPITAL_COMMUNITY): Payer: Self-pay

## 2017-08-01 ENCOUNTER — Emergency Department (HOSPITAL_COMMUNITY)
Admission: EM | Admit: 2017-08-01 | Discharge: 2017-08-01 | Disposition: A | Discharge status: Home / Self Care | Payer: No Typology Code available for payment source | Attending: Emergency Medicine | Admitting: Emergency Medicine

## 2017-08-01 DIAGNOSIS — Z79899 Other long term (current) drug therapy: Secondary | ICD-10-CM | POA: Diagnosis not present

## 2017-08-01 DIAGNOSIS — I110 Hypertensive heart disease with heart failure: Secondary | ICD-10-CM | POA: Insufficient documentation

## 2017-08-01 DIAGNOSIS — M545 Low back pain: Secondary | ICD-10-CM | POA: Diagnosis not present

## 2017-08-01 DIAGNOSIS — I509 Heart failure, unspecified: Secondary | ICD-10-CM | POA: Diagnosis not present

## 2017-08-01 DIAGNOSIS — I2510 Atherosclerotic heart disease of native coronary artery without angina pectoris: Secondary | ICD-10-CM | POA: Insufficient documentation

## 2017-08-01 DIAGNOSIS — F17210 Nicotine dependence, cigarettes, uncomplicated: Secondary | ICD-10-CM | POA: Diagnosis not present

## 2017-08-01 DIAGNOSIS — J449 Chronic obstructive pulmonary disease, unspecified: Secondary | ICD-10-CM | POA: Insufficient documentation

## 2017-08-01 MED ORDER — OXYCODONE-ACETAMINOPHEN 5-325 MG PO TABS: 1.0000 | ORAL_TABLET | Freq: Four times a day (QID) | ORAL | 0 refills | Status: DC | PRN

## 2017-08-01 MED ORDER — METHOCARBAMOL 500 MG PO TABS: 500.0000 mg | ORAL_TABLET | Freq: Two times a day (BID) | ORAL | 0 refills | Status: DC

## 2017-08-01 MED ORDER — MORPHINE SULFATE (PF) 4 MG/ML IV SOLN
4.0000 mg | Freq: Once | INTRAVENOUS | Status: AC
  Administered 2017-08-01: 4 mg via INTRAMUSCULAR
  Filled 2017-08-01: qty 1
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
## 2017-08-01 NOTE — ED Notes (Signed)
Pt verbalizes understanding of d/c paperwork, follow up instructions, and medications. Pt A/O x4, ambulatory. All belongings with patient upon departure.  
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
## 2017-08-06 ENCOUNTER — Emergency Department (HOSPITAL_COMMUNITY)
Admission: EM | Admit: 2017-08-06 | Discharge: 2017-08-06 | Disposition: A | Discharge status: Home / Self Care | Payer: No Typology Code available for payment source | Attending: Emergency Medicine | Admitting: Emergency Medicine

## 2017-08-06 ENCOUNTER — Encounter (HOSPITAL_COMMUNITY): Payer: Self-pay | Admitting: Emergency Medicine

## 2017-08-06 DIAGNOSIS — I509 Heart failure, unspecified: Secondary | ICD-10-CM | POA: Insufficient documentation

## 2017-08-06 DIAGNOSIS — I110 Hypertensive heart disease with heart failure: Secondary | ICD-10-CM | POA: Insufficient documentation

## 2017-08-06 DIAGNOSIS — I2510 Atherosclerotic heart disease of native coronary artery without angina pectoris: Secondary | ICD-10-CM | POA: Diagnosis not present

## 2017-08-06 DIAGNOSIS — S199XXD Unspecified injury of neck, subsequent encounter: Secondary | ICD-10-CM | POA: Diagnosis present

## 2017-08-06 DIAGNOSIS — S161XXD Strain of muscle, fascia and tendon at neck level, subsequent encounter: Secondary | ICD-10-CM | POA: Diagnosis not present

## 2017-08-06 DIAGNOSIS — F17210 Nicotine dependence, cigarettes, uncomplicated: Secondary | ICD-10-CM | POA: Insufficient documentation

## 2017-08-06 DIAGNOSIS — Z79899 Other long term (current) drug therapy: Secondary | ICD-10-CM | POA: Diagnosis not present

## 2017-08-06 DIAGNOSIS — J45909 Unspecified asthma, uncomplicated: Secondary | ICD-10-CM | POA: Insufficient documentation

## 2017-08-06 DIAGNOSIS — J449 Chronic obstructive pulmonary disease, unspecified: Secondary | ICD-10-CM | POA: Insufficient documentation

## 2017-08-06 MED ORDER — TIZANIDINE HCL 2 MG PO TABS: 2.0000 mg | ORAL_TABLET | Freq: Three times a day (TID) | ORAL | 0 refills | Status: DC | PRN

## 2017-08-06 MED ORDER — DIPHENHYDRAMINE HCL 25 MG PO CAPS
25.0000 mg | ORAL_CAPSULE | Freq: Once | ORAL | Status: AC
Start: 2017-08-06 — End: 2017-08-06
  Administered 2017-08-06: 25 mg via ORAL
  Filled 2017-08-06: qty 1

## 2017-08-06 MED ORDER — OXYCODONE-ACETAMINOPHEN 5-325 MG PO TABS
1.0000 | ORAL_TABLET | Freq: Once | ORAL | Status: AC
  Administered 2017-08-06: 1 via ORAL
  Filled 2017-08-06: qty 1

## 2017-08-06 MED ORDER — LIDOCAINE 5 % EX PTCH: 1.0000 | MEDICATED_PATCH | CUTANEOUS | 0 refills | Status: DC

## 2017-08-06 NOTE — Discharge Instructions (Signed)
Please read the attached information regarding neck exercises. Apply lidocaine patch to affected area. Take Zanaflex as needed for pain.  Continue Tylenol and Aleve. Follow-up with your primary care provider for further evaluation. Return to ED for worsening pain, injuries, falls, headaches, vision changes, fevers.

## 2017-08-06 NOTE — ED Triage Notes (Signed)
Pt in [place] 08/01/2017, seen in ED, pt states he continues to have lower neck pain. Pt states he has not had relief with prescriptions.
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
## 2017-08-07 ENCOUNTER — Emergency Department
Admission: EM | Admit: 2017-08-07 | Discharge: 2017-08-07 | Disposition: A | Discharge status: Home / Self Care | Payer: No Typology Code available for payment source | Attending: Emergency Medicine | Admitting: Emergency Medicine

## 2017-08-07 ENCOUNTER — Encounter: Payer: Self-pay | Admitting: Emergency Medicine

## 2017-08-07 ENCOUNTER — Other Ambulatory Visit: Payer: Self-pay

## 2017-08-07 DIAGNOSIS — J45909 Unspecified asthma, uncomplicated: Secondary | ICD-10-CM | POA: Diagnosis not present

## 2017-08-07 DIAGNOSIS — M545 Low back pain, unspecified: Secondary | ICD-10-CM

## 2017-08-07 DIAGNOSIS — I509 Heart failure, unspecified: Secondary | ICD-10-CM | POA: Insufficient documentation

## 2017-08-07 DIAGNOSIS — I2510 Atherosclerotic heart disease of native coronary artery without angina pectoris: Secondary | ICD-10-CM | POA: Insufficient documentation

## 2017-08-07 DIAGNOSIS — Y9389 Activity, other specified: Secondary | ICD-10-CM | POA: Diagnosis not present

## 2017-08-07 DIAGNOSIS — Z79899 Other long term (current) drug therapy: Secondary | ICD-10-CM | POA: Insufficient documentation

## 2017-08-07 DIAGNOSIS — F17210 Nicotine dependence, cigarettes, uncomplicated: Secondary | ICD-10-CM | POA: Insufficient documentation

## 2017-08-07 DIAGNOSIS — Y929 Unspecified place or not applicable: Secondary | ICD-10-CM | POA: Diagnosis not present

## 2017-08-07 DIAGNOSIS — Y999 Unspecified external cause status: Secondary | ICD-10-CM | POA: Insufficient documentation

## 2017-08-07 DIAGNOSIS — I110 Hypertensive heart disease with heart failure: Secondary | ICD-10-CM | POA: Insufficient documentation

## 2017-08-07 DIAGNOSIS — B20 Human immunodeficiency virus [HIV] disease: Secondary | ICD-10-CM | POA: Insufficient documentation

## 2017-08-07 DIAGNOSIS — G8929 Other chronic pain: Secondary | ICD-10-CM | POA: Diagnosis not present

## 2017-08-07 DIAGNOSIS — J449 Chronic obstructive pulmonary disease, unspecified: Secondary | ICD-10-CM | POA: Insufficient documentation

## 2017-08-07 MED ORDER — LIDOCAINE 5 % EX PTCH
1.0000 | MEDICATED_PATCH | CUTANEOUS | Status: DC
  Administered 2017-08-07: 1 via TRANSDERMAL
  Filled 2017-08-07: qty 1

## 2017-08-07 MED ORDER — KETOROLAC TROMETHAMINE 60 MG/2ML IM SOLN
60.0000 mg | Freq: Once | INTRAMUSCULAR | Status: AC
  Administered 2017-08-07: 60 mg via INTRAMUSCULAR
  Filled 2017-08-07: qty 2
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
## 2017-08-11 ENCOUNTER — Encounter (HOSPITAL_COMMUNITY): Payer: Self-pay

## 2017-08-11 ENCOUNTER — Emergency Department (HOSPITAL_COMMUNITY)
Admission: EM | Admit: 2017-08-11 | Discharge: 2017-08-11 | Disposition: A | Discharge status: Home / Self Care | Payer: Medicare Other | Attending: Emergency Medicine | Admitting: Emergency Medicine

## 2017-08-11 DIAGNOSIS — I110 Hypertensive heart disease with heart failure: Secondary | ICD-10-CM | POA: Diagnosis not present

## 2017-08-11 DIAGNOSIS — Z79899 Other long term (current) drug therapy: Secondary | ICD-10-CM | POA: Diagnosis not present

## 2017-08-11 DIAGNOSIS — F17210 Nicotine dependence, cigarettes, uncomplicated: Secondary | ICD-10-CM | POA: Insufficient documentation

## 2017-08-11 DIAGNOSIS — J449 Chronic obstructive pulmonary disease, unspecified: Secondary | ICD-10-CM | POA: Diagnosis not present

## 2017-08-11 DIAGNOSIS — I509 Heart failure, unspecified: Secondary | ICD-10-CM | POA: Insufficient documentation

## 2017-08-11 DIAGNOSIS — M545 Low back pain, unspecified: Secondary | ICD-10-CM

## 2017-08-11 DIAGNOSIS — I2510 Atherosclerotic heart disease of native coronary artery without angina pectoris: Secondary | ICD-10-CM | POA: Diagnosis not present

## 2017-08-11 DIAGNOSIS — B20 Human immunodeficiency virus [HIV] disease: Secondary | ICD-10-CM | POA: Diagnosis not present

## 2017-08-11 DIAGNOSIS — G8929 Other chronic pain: Secondary | ICD-10-CM | POA: Diagnosis not present

## 2017-08-11 DIAGNOSIS — J45909 Unspecified asthma, uncomplicated: Secondary | ICD-10-CM | POA: Insufficient documentation

## 2017-08-11 MED ORDER — KETOROLAC TROMETHAMINE 30 MG/ML IJ SOLN
30.0000 mg | Freq: Once | INTRAMUSCULAR | Status: AC
  Administered 2017-08-11: 30 mg via INTRAVENOUS
  Filled 2017-08-11: qty 1

## 2017-08-11 MED ORDER — METHOCARBAMOL 1000 MG/10ML IJ SOLN
1000.0000 mg | Freq: Once | INTRAVENOUS | Status: AC
  Administered 2017-08-11: 1000 mg via INTRAVENOUS
  Filled 2017-08-11: qty 10

## 2017-08-11 MED ORDER — CELECOXIB 100 MG PO CAPS: 100.0000 mg | ORAL_CAPSULE | Freq: Two times a day (BID) | ORAL | 0 refills | Status: DC

## 2017-08-11 MED ORDER — CYCLOBENZAPRINE HCL 10 MG PO TABS: 10.0000 mg | ORAL_TABLET | Freq: Three times a day (TID) | ORAL | 0 refills | Status: DC | PRN

## 2017-08-11 MED ORDER — DIPHENHYDRAMINE HCL 50 MG/ML IJ SOLN
25.0000 mg | Freq: Once | INTRAMUSCULAR | Status: AC
  Administered 2017-08-11: 25 mg via INTRAVENOUS
  Filled 2017-08-11: qty 1

## 2017-08-11 MED ORDER — OXYCODONE-ACETAMINOPHEN 5-325 MG PO TABS: 1.0000 | ORAL_TABLET | Freq: Four times a day (QID) | ORAL | 0 refills | Status: DC | PRN

## 2017-08-11 NOTE — ED Notes (Signed)
ED Provider at bedside. 
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
## 2017-08-11 NOTE — Discharge Instructions (Signed)
Apply ice several times a day. If pain persists, you may need to talk with your doctor about referral for physical therapy, and possibly to pain management.
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
## 2017-08-22 ENCOUNTER — Emergency Department (HOSPITAL_COMMUNITY)
Admission: EM | Admit: 2017-08-22 | Discharge: 2017-08-22 | Disposition: A | Discharge status: Home / Self Care | Payer: Medicare Other | Attending: Emergency Medicine | Admitting: Emergency Medicine

## 2017-08-22 ENCOUNTER — Encounter (HOSPITAL_COMMUNITY): Payer: Self-pay | Admitting: Emergency Medicine

## 2017-08-22 DIAGNOSIS — G8929 Other chronic pain: Secondary | ICD-10-CM | POA: Diagnosis not present

## 2017-08-22 DIAGNOSIS — M545 Low back pain, unspecified: Secondary | ICD-10-CM

## 2017-08-22 DIAGNOSIS — I110 Hypertensive heart disease with heart failure: Secondary | ICD-10-CM | POA: Insufficient documentation

## 2017-08-22 DIAGNOSIS — J449 Chronic obstructive pulmonary disease, unspecified: Secondary | ICD-10-CM | POA: Diagnosis not present

## 2017-08-22 DIAGNOSIS — F17210 Nicotine dependence, cigarettes, uncomplicated: Secondary | ICD-10-CM | POA: Diagnosis not present

## 2017-08-22 DIAGNOSIS — I2510 Atherosclerotic heart disease of native coronary artery without angina pectoris: Secondary | ICD-10-CM | POA: Diagnosis not present

## 2017-08-22 DIAGNOSIS — Z79899 Other long term (current) drug therapy: Secondary | ICD-10-CM | POA: Insufficient documentation

## 2017-08-22 DIAGNOSIS — I509 Heart failure, unspecified: Secondary | ICD-10-CM | POA: Diagnosis not present

## 2017-08-22 MED ORDER — KETOROLAC TROMETHAMINE 60 MG/2ML IM SOLN
60.0000 mg | Freq: Once | INTRAMUSCULAR | Status: AC
  Administered 2017-08-22: 60 mg via INTRAMUSCULAR
  Filled 2017-08-22: qty 2
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
## 2017-08-22 NOTE — ED Notes (Signed)
ED Provider at bedside. 

## 2017-09-09 ENCOUNTER — Emergency Department (HOSPITAL_COMMUNITY)
Admission: EM | Admit: 2017-09-09 | Discharge: 2017-09-10 | Disposition: A | Discharge status: Home / Self Care | Payer: Medicare Other | Attending: Emergency Medicine | Admitting: Emergency Medicine

## 2017-09-09 ENCOUNTER — Encounter (HOSPITAL_COMMUNITY): Payer: Self-pay | Admitting: Emergency Medicine

## 2017-09-09 ENCOUNTER — Other Ambulatory Visit: Payer: Self-pay

## 2017-09-09 ENCOUNTER — Emergency Department (HOSPITAL_COMMUNITY): Payer: Medicare Other

## 2017-09-09 DIAGNOSIS — M545 Low back pain: Secondary | ICD-10-CM | POA: Diagnosis not present

## 2017-09-09 DIAGNOSIS — Z79899 Other long term (current) drug therapy: Secondary | ICD-10-CM | POA: Insufficient documentation

## 2017-09-09 DIAGNOSIS — I10 Essential (primary) hypertension: Secondary | ICD-10-CM | POA: Insufficient documentation

## 2017-09-09 DIAGNOSIS — B20 Human immunodeficiency virus [HIV] disease: Secondary | ICD-10-CM | POA: Diagnosis not present

## 2017-09-09 DIAGNOSIS — F17210 Nicotine dependence, cigarettes, uncomplicated: Secondary | ICD-10-CM | POA: Insufficient documentation

## 2017-09-09 DIAGNOSIS — I2510 Atherosclerotic heart disease of native coronary artery without angina pectoris: Secondary | ICD-10-CM | POA: Diagnosis not present

## 2017-09-09 MED ORDER — DIPHENHYDRAMINE HCL 25 MG PO CAPS
25.0000 mg | ORAL_CAPSULE | Freq: Once | ORAL | Status: AC
  Administered 2017-09-09: 25 mg via ORAL
  Filled 2017-09-09: qty 1

## 2017-09-09 MED ORDER — HYDROMORPHONE HCL 1 MG/ML IJ SOLN
1.0000 mg | Freq: Once | INTRAMUSCULAR | Status: AC
  Administered 2017-09-09: 1 mg via INTRAMUSCULAR
  Filled 2017-09-09: qty 1

## 2017-09-09 MED ORDER — OXYCODONE-ACETAMINOPHEN 5-325 MG PO TABS: 1.0000 | ORAL_TABLET | ORAL | 0 refills | Status: DC | PRN

## 2017-09-09 MED ORDER — HYDROMORPHONE HCL 1 MG/ML IJ SOLN: 1.0000 mg | Freq: Once | INTRAMUSCULAR | Status: DC

## 2017-09-09 MED ORDER — LORAZEPAM 2 MG/ML IJ SOLN
1.0000 mg | Freq: Once | INTRAMUSCULAR | Status: AC
  Administered 2017-09-09: 1 mg via INTRAMUSCULAR
  Filled 2017-09-09: qty 1

## 2017-09-09 MED ORDER — DIPHENHYDRAMINE HCL 25 MG PO CAPS
ORAL_CAPSULE | ORAL | Status: AC
  Filled 2017-09-09: qty 1

## 2017-09-09 NOTE — ED Notes (Signed)
Pt reports that he has lower back pain 10/10 and bilateral hip that radiates down left leg. Pt reports that he took tylenol but was not effective in pain management. Pt is alert and oriented x 4 and is verbally responsive. Pt is accompanied
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
## 2017-09-09 NOTE — Discharge Instructions (Signed)
Your MRI showed no evidence of cord injury.  We suspect your muscular skeletal pain is due to the arthritis and degenerative disc disease.  Please follow-up with your spine team and your PCP for further pain management.  If any symptoms change or worsen, please return to the nearest emergency department.

## 2017-09-09 NOTE — ED Notes (Signed)
Bed: WA17 Expected date:  Expected time:  Means of arrival:  Comments: For triage 7

## 2017-09-09 NOTE — ED Notes (Signed)
Patient transported to MRI 
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
## 2017-11-14 ENCOUNTER — Other Ambulatory Visit: Payer: Self-pay

## 2017-11-14 ENCOUNTER — Emergency Department (HOSPITAL_COMMUNITY): Payer: Medicare Other

## 2017-11-14 ENCOUNTER — Emergency Department (HOSPITAL_COMMUNITY)
Admission: EM | Admit: 2017-11-14 | Discharge: 2017-11-14 | Disposition: A | Discharge status: Home / Self Care | Payer: Medicare Other | Attending: Emergency Medicine | Admitting: Emergency Medicine

## 2017-11-14 ENCOUNTER — Encounter (HOSPITAL_COMMUNITY): Payer: Self-pay

## 2017-11-14 DIAGNOSIS — Y929 Unspecified place or not applicable: Secondary | ICD-10-CM | POA: Insufficient documentation

## 2017-11-14 DIAGNOSIS — M25552 Pain in left hip: Secondary | ICD-10-CM | POA: Insufficient documentation

## 2017-11-14 DIAGNOSIS — J45909 Unspecified asthma, uncomplicated: Secondary | ICD-10-CM | POA: Diagnosis not present

## 2017-11-14 DIAGNOSIS — Y999 Unspecified external cause status: Secondary | ICD-10-CM | POA: Diagnosis not present

## 2017-11-14 DIAGNOSIS — I2510 Atherosclerotic heart disease of native coronary artery without angina pectoris: Secondary | ICD-10-CM | POA: Diagnosis not present

## 2017-11-14 DIAGNOSIS — F17210 Nicotine dependence, cigarettes, uncomplicated: Secondary | ICD-10-CM | POA: Diagnosis not present

## 2017-11-14 DIAGNOSIS — R1032 Left lower quadrant pain: Secondary | ICD-10-CM | POA: Diagnosis present

## 2017-11-14 DIAGNOSIS — Y939 Activity, unspecified: Secondary | ICD-10-CM | POA: Diagnosis not present

## 2017-11-14 DIAGNOSIS — Z79899 Other long term (current) drug therapy: Secondary | ICD-10-CM | POA: Insufficient documentation

## 2017-11-14 DIAGNOSIS — S39012A Strain of muscle, fascia and tendon of lower back, initial encounter: Secondary | ICD-10-CM | POA: Diagnosis not present

## 2017-11-14 DIAGNOSIS — J449 Chronic obstructive pulmonary disease, unspecified: Secondary | ICD-10-CM | POA: Diagnosis not present

## 2017-11-14 DIAGNOSIS — X58XXXA Exposure to other specified factors, initial encounter: Secondary | ICD-10-CM | POA: Diagnosis not present

## 2017-11-14 DIAGNOSIS — I110 Hypertensive heart disease with heart failure: Secondary | ICD-10-CM | POA: Diagnosis not present

## 2017-11-14 DIAGNOSIS — I509 Heart failure, unspecified: Secondary | ICD-10-CM | POA: Diagnosis not present

## 2017-11-14 LAB — BASIC METABOLIC PANEL
Anion gap: 8 (ref 5–15)
BUN: 15 mg/dL (ref 6–20)
CO2: 26 mmol/L (ref 22–32)
Calcium: 8.7 mg/dL — ABNORMAL LOW (ref 8.9–10.3)
Chloride: 105 mmol/L (ref 101–111)
Creatinine, Ser: 0.86 mg/dL (ref 0.61–1.24)
GFR calc Af Amer: >60 mL/min (ref >60)
GFR calc non Af Amer: >60 mL/min (ref >60)
Glucose, Bld: 86 mg/dL (ref 65–99)
Potassium: 3.3 mmol/L — ABNORMAL LOW (ref 3.5–5.1)
Sodium: 139 mmol/L (ref 135–145)

## 2017-11-14 LAB — CBC WITH DIFFERENTIAL/PLATELET
Basophils Absolute: 0 10*3/uL (ref 0.0–0.1)
Basophils Relative: 0 %
Eosinophils Absolute: 0 10*3/uL (ref 0.0–0.7)
Eosinophils Relative: 1 %
HCT: 39.3 % (ref 39.0–52.0)
Hemoglobin: 12.6 g/dL — ABNORMAL LOW (ref 13.0–17.0)
Lymphocytes Relative: 34 %
Lymphs Abs: 1.8 10*3/uL (ref 0.7–4.0)
MCH: 27.3 pg (ref 26.0–34.0)
MCHC: 32.1 g/dL (ref 30.0–36.0)
MCV: 85.1 fL (ref 78.0–100.0)
Monocytes Absolute: 0.4 10*3/uL (ref 0.1–1.0)
Monocytes Relative: 8 %
Neutro Abs: 2.9 10*3/uL (ref 1.7–7.7)
Neutrophils Relative %: 57 %
Platelets: 304 10*3/uL (ref 150–400)
RBC: 4.62 MIL/uL (ref 4.22–5.81)
RDW: 15.7 % — ABNORMAL HIGH (ref 11.5–15.5)
WBC: 5.1 10*3/uL (ref 4.0–10.5)

## 2017-11-14 LAB — SEDIMENTATION RATE: Sed Rate: 5 mm/hr (ref 0–16)

## 2017-11-14 LAB — C-REACTIVE PROTEIN: CRP: <0.8 mg/dL (ref <1.0)

## 2017-11-14 LAB — CK: Total CK: 120 U/L (ref 49–397)

## 2017-11-14 MED ORDER — HYDROCODONE-ACETAMINOPHEN 5-325 MG PO TABS: 1.0000 | ORAL_TABLET | Freq: Once | ORAL | Status: DC

## 2017-11-14 MED ORDER — OXYCODONE-ACETAMINOPHEN 5-325 MG PO TABS
1.0000 | ORAL_TABLET | Freq: Once | ORAL | Status: AC
  Administered 2017-11-14: 1 via ORAL
  Filled 2017-11-14: qty 1

## 2017-11-14 MED ORDER — ACETAMINOPHEN ER 650 MG PO TBCR: 650.0000 mg | EXTENDED_RELEASE_TABLET | Freq: Three times a day (TID) | ORAL | 0 refills | Status: DC | PRN

## 2017-11-14 NOTE — ED Notes (Signed)
Bed: [facility]PT3 Expected date:  Expected time:  Means of arrival:  Comments: 
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
## 2017-11-14 NOTE — Discharge Instructions (Signed)
All the results in the ER are normal, labs and imaging. We are not sure what is causing your symptoms. The workup in the ER is not complete, and is limited to screening for life threatening and emergent conditions only, so please see a primary care doctor for further evaluation.  RETURN TO THE ER IMMEDIATELY IF YOU START HAVING FEVERS, CHILLS, SEVERE PAIN, INABILITY TO WALK.

## 2017-11-14 NOTE — ED Triage Notes (Signed)
He c/o left-sided low back pain radiating into left hip/leg x 2 days. He is in no distress, but he grimaces at times, as if in pain.

## 2017-12-13 DIAGNOSIS — E782 Mixed hyperlipidemia: Secondary | ICD-10-CM

## 2017-12-13 HISTORY — DX: Mixed hyperlipidemia: E78.2

## 2018-02-01 DIAGNOSIS — Z6822 Body mass index (BMI) 22.0-22.9, adult: Secondary | ICD-10-CM | POA: Insufficient documentation

## 2018-03-22 ENCOUNTER — Emergency Department (HOSPITAL_COMMUNITY): Payer: Medicare Other

## 2018-03-22 ENCOUNTER — Other Ambulatory Visit: Payer: Self-pay

## 2018-03-22 ENCOUNTER — Encounter (HOSPITAL_COMMUNITY): Payer: Self-pay | Admitting: Emergency Medicine

## 2018-03-22 ENCOUNTER — Emergency Department (HOSPITAL_COMMUNITY)
Admission: EM | Admit: 2018-03-22 | Discharge: 2018-03-22 | Disposition: A | Discharge status: Home / Self Care | Payer: Medicare Other | Attending: Emergency Medicine | Admitting: Emergency Medicine

## 2018-03-22 DIAGNOSIS — I2510 Atherosclerotic heart disease of native coronary artery without angina pectoris: Secondary | ICD-10-CM | POA: Diagnosis not present

## 2018-03-22 DIAGNOSIS — S39012A Strain of muscle, fascia and tendon of lower back, initial encounter: Secondary | ICD-10-CM | POA: Diagnosis not present

## 2018-03-22 DIAGNOSIS — Y999 Unspecified external cause status: Secondary | ICD-10-CM | POA: Insufficient documentation

## 2018-03-22 DIAGNOSIS — Y939 Activity, unspecified: Secondary | ICD-10-CM | POA: Diagnosis not present

## 2018-03-22 DIAGNOSIS — Y929 Unspecified place or not applicable: Secondary | ICD-10-CM | POA: Diagnosis not present

## 2018-03-22 DIAGNOSIS — I10 Essential (primary) hypertension: Secondary | ICD-10-CM | POA: Diagnosis not present

## 2018-03-22 DIAGNOSIS — F17210 Nicotine dependence, cigarettes, uncomplicated: Secondary | ICD-10-CM | POA: Diagnosis not present

## 2018-03-22 DIAGNOSIS — B20 Human immunodeficiency virus [HIV] disease: Secondary | ICD-10-CM | POA: Insufficient documentation

## 2018-03-22 DIAGNOSIS — Z79899 Other long term (current) drug therapy: Secondary | ICD-10-CM | POA: Insufficient documentation

## 2018-03-22 DIAGNOSIS — Z041 Encounter for examination and observation following transport accident: Secondary | ICD-10-CM | POA: Diagnosis present

## 2018-03-22 MED ORDER — OXYCODONE-ACETAMINOPHEN 5-325 MG PO TABS
1.0000 | ORAL_TABLET | Freq: Once | ORAL | Status: AC
  Administered 2018-03-22: 1 via ORAL
  Filled 2018-03-22: qty 1

## 2018-03-22 MED ORDER — METHOCARBAMOL 500 MG PO TABS: 500.0000 mg | ORAL_TABLET | Freq: Two times a day (BID) | ORAL | 0 refills | Status: DC
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
## 2018-03-22 NOTE — Discharge Instructions (Signed)
Your x-ray of your back today was negative for acute abnormality. Please take the following medicine to help with your symptoms. Return to ED for worsening symptoms, trouble walking, numbness in legs, loss of bowel or bladder function or pain with urination.
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
## 2018-05-05 ENCOUNTER — Emergency Department (HOSPITAL_COMMUNITY)
Admission: EM | Admit: 2018-05-05 | Discharge: 2018-05-05 | Disposition: A | Discharge status: Home / Self Care | Payer: Medicare Other | Attending: Emergency Medicine | Admitting: Emergency Medicine

## 2018-05-05 ENCOUNTER — Encounter (HOSPITAL_COMMUNITY): Payer: Self-pay | Admitting: *Deleted

## 2018-05-05 DIAGNOSIS — M25512 Pain in left shoulder: Secondary | ICD-10-CM | POA: Insufficient documentation

## 2018-05-05 DIAGNOSIS — I110 Hypertensive heart disease with heart failure: Secondary | ICD-10-CM | POA: Insufficient documentation

## 2018-05-05 DIAGNOSIS — I2510 Atherosclerotic heart disease of native coronary artery without angina pectoris: Secondary | ICD-10-CM | POA: Diagnosis not present

## 2018-05-05 DIAGNOSIS — J449 Chronic obstructive pulmonary disease, unspecified: Secondary | ICD-10-CM | POA: Diagnosis not present

## 2018-05-05 DIAGNOSIS — I509 Heart failure, unspecified: Secondary | ICD-10-CM | POA: Diagnosis not present

## 2018-05-05 DIAGNOSIS — F17210 Nicotine dependence, cigarettes, uncomplicated: Secondary | ICD-10-CM | POA: Diagnosis not present

## 2018-05-05 DIAGNOSIS — M5432 Sciatica, left side: Secondary | ICD-10-CM

## 2018-05-05 DIAGNOSIS — Z21 Asymptomatic human immunodeficiency virus [HIV] infection status: Secondary | ICD-10-CM | POA: Diagnosis not present

## 2018-05-05 DIAGNOSIS — M5442 Lumbago with sciatica, left side: Secondary | ICD-10-CM | POA: Diagnosis not present

## 2018-05-05 DIAGNOSIS — M25552 Pain in left hip: Secondary | ICD-10-CM | POA: Diagnosis present

## 2018-05-05 DIAGNOSIS — Z79899 Other long term (current) drug therapy: Secondary | ICD-10-CM | POA: Diagnosis not present

## 2018-05-05 DIAGNOSIS — M25511 Pain in right shoulder: Secondary | ICD-10-CM | POA: Diagnosis not present

## 2018-05-05 MED ORDER — TIZANIDINE HCL 2 MG PO TABS: 2.0000 mg | ORAL_TABLET | Freq: Three times a day (TID) | ORAL | 0 refills | Status: DC | PRN

## 2018-05-05 MED ORDER — PREDNISONE 10 MG (21) PO TBPK: ORAL_TABLET | Freq: Every day | ORAL | 0 refills | Status: DC

## 2018-05-05 MED ORDER — KETOROLAC TROMETHAMINE 15 MG/ML IJ SOLN
15.0000 mg | Freq: Once | INTRAMUSCULAR | Status: AC
  Administered 2018-05-05: 15 mg via INTRAMUSCULAR
  Filled 2018-05-05: qty 1
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
## 2018-05-05 NOTE — Discharge Instructions (Addendum)
Take Prednisone and Zanaflex as prescribed. Follow up with your doctor, return to ER for worsening or concerning symptoms. Warm compresses to sore muscles for 20 minutes at a time, gentle stretching as discussed.

## 2018-05-05 NOTE — ED Triage Notes (Signed)
Pt complains of back pain, bilateral shoulder pain and neck pain x 3 days. Pt denies injury. Pain started while pt was laying on couch.

## 2018-08-17 ENCOUNTER — Emergency Department (HOSPITAL_COMMUNITY)
Admission: EM | Admit: 2018-08-17 | Discharge: 2018-08-18 | Disposition: A | Discharge status: Home / Self Care | Payer: Medicare Other | Attending: Emergency Medicine | Admitting: Emergency Medicine

## 2018-08-17 ENCOUNTER — Encounter (HOSPITAL_COMMUNITY): Payer: Self-pay | Admitting: *Deleted

## 2018-08-17 ENCOUNTER — Other Ambulatory Visit: Payer: Self-pay

## 2018-08-17 DIAGNOSIS — B20 Human immunodeficiency virus [HIV] disease: Secondary | ICD-10-CM | POA: Insufficient documentation

## 2018-08-17 DIAGNOSIS — Y929 Unspecified place or not applicable: Secondary | ICD-10-CM | POA: Diagnosis not present

## 2018-08-17 DIAGNOSIS — Z79899 Other long term (current) drug therapy: Secondary | ICD-10-CM | POA: Insufficient documentation

## 2018-08-17 DIAGNOSIS — Y939 Activity, unspecified: Secondary | ICD-10-CM | POA: Diagnosis not present

## 2018-08-17 DIAGNOSIS — I10 Essential (primary) hypertension: Secondary | ICD-10-CM | POA: Insufficient documentation

## 2018-08-17 DIAGNOSIS — W172XXA Fall into hole, initial encounter: Secondary | ICD-10-CM | POA: Diagnosis not present

## 2018-08-17 DIAGNOSIS — I2510 Atherosclerotic heart disease of native coronary artery without angina pectoris: Secondary | ICD-10-CM | POA: Diagnosis not present

## 2018-08-17 DIAGNOSIS — S79912A Unspecified injury of left hip, initial encounter: Secondary | ICD-10-CM | POA: Diagnosis present

## 2018-08-17 DIAGNOSIS — M25552 Pain in left hip: Secondary | ICD-10-CM | POA: Insufficient documentation

## 2018-08-17 DIAGNOSIS — Y999 Unspecified external cause status: Secondary | ICD-10-CM | POA: Insufficient documentation

## 2018-08-17 NOTE — ED Triage Notes (Signed)
Pt states started having pain about 5;30p yesterday and ran with kids and accidentally stepped in a hole and caught self.  Pt having pain in left buttock area, feels like bone is rubbing together when he stands.  Never had this pain before.

## 2018-08-17 NOTE — ED Notes (Signed)
Patient transported to X-ray 

## 2018-08-18 ENCOUNTER — Emergency Department (HOSPITAL_COMMUNITY): Payer: Medicare Other

## 2018-08-18 DIAGNOSIS — M25552 Pain in left hip: Secondary | ICD-10-CM | POA: Diagnosis not present

## 2018-08-18 MED ORDER — OXYCODONE-ACETAMINOPHEN 5-325 MG PO TABS
1.0000 | ORAL_TABLET | Freq: Once | ORAL | Status: AC
  Administered 2018-08-18: 1 via ORAL
  Filled 2018-08-18: qty 1

## 2018-08-18 MED ORDER — OXYCODONE-ACETAMINOPHEN 5-325 MG PO TABS: 1.0000 | ORAL_TABLET | Freq: Four times a day (QID) | ORAL | 0 refills | Status: DC | PRN

## 2018-08-18 MED ORDER — CELECOXIB 100 MG PO CAPS: 100.0000 mg | ORAL_CAPSULE | Freq: Two times a day (BID) | ORAL | 0 refills | Status: DC

## 2018-08-18 NOTE — ED Notes (Signed)
To mri 

## 2018-08-18 NOTE — Discharge Instructions (Signed)
The reason for your hip pain is not clear.  X-ray, CT scan, and MRI scan all failed to show evidence of any injury to the bone.  Please apply ice for 30 minutes at a time, 3-4 times a day.  Take the medications as prescribed.  Use crutches as needed.  Follow-up with the orthopedic physician.  Return to the emergency department if you are having any problems.

## 2018-08-18 NOTE — ED Notes (Signed)
Patient transported to CT 
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
## 2018-10-13 ENCOUNTER — Emergency Department (HOSPITAL_COMMUNITY): Payer: Medicare Other

## 2018-10-13 ENCOUNTER — Emergency Department (HOSPITAL_COMMUNITY)
Admission: EM | Admit: 2018-10-13 | Discharge: 2018-10-13 | Disposition: A | Discharge status: Home / Self Care | Payer: Medicare Other | Attending: Emergency Medicine | Admitting: Emergency Medicine

## 2018-10-13 DIAGNOSIS — M545 Low back pain, unspecified: Secondary | ICD-10-CM

## 2018-10-13 DIAGNOSIS — I2510 Atherosclerotic heart disease of native coronary artery without angina pectoris: Secondary | ICD-10-CM | POA: Insufficient documentation

## 2018-10-13 DIAGNOSIS — F17210 Nicotine dependence, cigarettes, uncomplicated: Secondary | ICD-10-CM | POA: Insufficient documentation

## 2018-10-13 DIAGNOSIS — B20 Human immunodeficiency virus [HIV] disease: Secondary | ICD-10-CM | POA: Insufficient documentation

## 2018-10-13 DIAGNOSIS — M79604 Pain in right leg: Secondary | ICD-10-CM | POA: Diagnosis not present

## 2018-10-13 DIAGNOSIS — J449 Chronic obstructive pulmonary disease, unspecified: Secondary | ICD-10-CM | POA: Insufficient documentation

## 2018-10-13 DIAGNOSIS — Z79899 Other long term (current) drug therapy: Secondary | ICD-10-CM | POA: Diagnosis not present

## 2018-10-13 DIAGNOSIS — I509 Heart failure, unspecified: Secondary | ICD-10-CM | POA: Insufficient documentation

## 2018-10-13 DIAGNOSIS — I110 Hypertensive heart disease with heart failure: Secondary | ICD-10-CM | POA: Diagnosis not present

## 2018-10-13 DIAGNOSIS — G8929 Other chronic pain: Secondary | ICD-10-CM

## 2018-10-13 MED ORDER — KETAMINE HCL 50 MG/ML IJ SOLN
1.5000 mg/kg | Freq: Once | INTRAMUSCULAR | Status: AC
  Administered 2018-10-13: 100 mg via INTRAMUSCULAR
  Filled 2018-10-13: qty 10

## 2018-10-13 MED ORDER — OXYCODONE-ACETAMINOPHEN 5-325 MG PO TABS
2.0000 | ORAL_TABLET | Freq: Once | ORAL | Status: AC
  Administered 2018-10-13: 2 via ORAL
  Filled 2018-10-13: qty 2

## 2018-10-13 NOTE — ED Triage Notes (Signed)
Pt arrives with back and leg pain, stepped in a hole about a week ago and has been having back and leg pain ever since. Went to see dr in [place], had an allergic (hives, throat swelling)

## 2018-10-13 NOTE — ED Notes (Signed)
Patient transported to CT 
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
## 2018-10-13 NOTE — ED Notes (Signed)
Pt back from CT

## 2018-10-13 NOTE — ED Notes (Signed)
Tachycardia and hypertension as ketamine kicked in. Pt stated was having trouble breathing, sats in ninties on room air, lung sounds clear. Dr Jeanell Sparrow at bedside, remained at bedside until heart rate slowed

## 2019-02-09 ENCOUNTER — Emergency Department (HOSPITAL_COMMUNITY)
Admission: EM | Admit: 2019-02-09 | Discharge: 2019-02-10 | Disposition: A | Discharge status: Home / Self Care | Payer: Medicare Other | Source: Home / Self Care | Attending: Emergency Medicine | Admitting: Emergency Medicine

## 2019-02-09 ENCOUNTER — Emergency Department (HOSPITAL_COMMUNITY)
Admission: EM | Admit: 2019-02-09 | Discharge: 2019-02-09 | Disposition: A | Discharge status: Home / Self Care | Payer: Medicare Other | Attending: Emergency Medicine | Admitting: Emergency Medicine

## 2019-02-09 ENCOUNTER — Encounter (HOSPITAL_COMMUNITY): Payer: Self-pay | Admitting: Emergency Medicine

## 2019-02-09 ENCOUNTER — Other Ambulatory Visit: Payer: Self-pay

## 2019-02-09 DIAGNOSIS — J449 Chronic obstructive pulmonary disease, unspecified: Secondary | ICD-10-CM | POA: Diagnosis not present

## 2019-02-09 DIAGNOSIS — I2510 Atherosclerotic heart disease of native coronary artery without angina pectoris: Secondary | ICD-10-CM | POA: Insufficient documentation

## 2019-02-09 DIAGNOSIS — B20 Human immunodeficiency virus [HIV] disease: Secondary | ICD-10-CM | POA: Insufficient documentation

## 2019-02-09 DIAGNOSIS — M545 Low back pain, unspecified: Secondary | ICD-10-CM

## 2019-02-09 DIAGNOSIS — Z87891 Personal history of nicotine dependence: Secondary | ICD-10-CM | POA: Diagnosis not present

## 2019-02-09 DIAGNOSIS — G8929 Other chronic pain: Secondary | ICD-10-CM | POA: Insufficient documentation

## 2019-02-09 DIAGNOSIS — Z79899 Other long term (current) drug therapy: Secondary | ICD-10-CM | POA: Insufficient documentation

## 2019-02-09 DIAGNOSIS — F17210 Nicotine dependence, cigarettes, uncomplicated: Secondary | ICD-10-CM | POA: Insufficient documentation

## 2019-02-09 DIAGNOSIS — I10 Essential (primary) hypertension: Secondary | ICD-10-CM | POA: Insufficient documentation

## 2019-02-09 MED ORDER — OXYCODONE-ACETAMINOPHEN 5-325 MG PO TABS
1.0000 | ORAL_TABLET | Freq: Once | ORAL | Status: AC
  Administered 2019-02-09: 1 via ORAL
  Filled 2019-02-09: qty 1

## 2019-02-09 MED ORDER — METHOCARBAMOL 500 MG PO TABS: 500.0000 mg | ORAL_TABLET | Freq: Two times a day (BID) | ORAL | 0 refills | Status: DC

## 2019-02-09 MED ORDER — LIDOCAINE 5 % EX PTCH: 1.0000 | MEDICATED_PATCH | CUTANEOUS | 0 refills | Status: DC

## 2019-02-09 NOTE — ED Triage Notes (Signed)
PT here for eval of back pain that is chronic/acute, decreased ROM and bladder/ bowel incontinence that started 5 days ago.

## 2019-02-09 NOTE — ED Notes (Signed)
Pt has been more active in past week than usual and has developed increased low back pain. Has not experienced any incontinence, but has bowel urgency.  No recent IV drug use for 5 years, does not go to pain clinic anymore for past 5 years approx.

## 2019-02-09 NOTE — ED Triage Notes (Signed)
Pt in POV, seem this AM for same. Reports lower back pain, chronic in nature. Ambulatory.
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
## 2019-02-09 NOTE — Discharge Instructions (Signed)
You may take muscle relaxant for pain.  Apply Lidoderm patch to affected area daily as it will help you with the pain.  Call and follow-up with your doctor for further care.

## 2019-02-09 NOTE — ED Notes (Signed)
Pt c/o lower back pain sinxce 1995  Chronic back pain since then he has had sevceral surgeries on his back also  No new injury

## 2019-02-10 ENCOUNTER — Telehealth (HOSPITAL_COMMUNITY): Payer: Self-pay | Admitting: Physician Assistant

## 2019-02-10 DIAGNOSIS — M545 Low back pain: Secondary | ICD-10-CM | POA: Diagnosis not present

## 2019-02-10 MED ORDER — METHOCARBAMOL 500 MG PO TABS: 500.0000 mg | ORAL_TABLET | Freq: Four times a day (QID) | ORAL | 0 refills | Status: DC | PRN

## 2019-02-10 MED ORDER — OXYCODONE-ACETAMINOPHEN 5-325 MG PO TABS: 1.0000 | ORAL_TABLET | Freq: Four times a day (QID) | ORAL | 0 refills | Status: DC | PRN

## 2019-02-10 MED ORDER — OXYCODONE-ACETAMINOPHEN 5-325 MG PO TABS
1.0000 | ORAL_TABLET | Freq: Once | ORAL | Status: AC
  Administered 2019-02-10: 1 via ORAL
  Filled 2019-02-10: qty 1

## 2019-02-10 MED ORDER — METHOCARBAMOL 1000 MG/10ML IJ SOLN
1000.0000 mg | Freq: Once | INTRAVENOUS | Status: AC
  Administered 2019-02-10: 1000 mg via INTRAVENOUS
  Filled 2019-02-10: qty 10
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
## 2019-02-10 NOTE — Discharge Instructions (Addendum)
Try using the over-the-counter lidocaine patches. They are 4% lidocaine, so not quite as strong as the prescription ones, but still may be helpful.

## 2019-03-24 ENCOUNTER — Encounter (HOSPITAL_COMMUNITY): Payer: Self-pay | Admitting: Emergency Medicine

## 2019-03-24 ENCOUNTER — Other Ambulatory Visit: Payer: Self-pay

## 2019-03-24 ENCOUNTER — Emergency Department (HOSPITAL_COMMUNITY): Payer: Medicare Other

## 2019-03-24 ENCOUNTER — Emergency Department (HOSPITAL_COMMUNITY)
Admission: EM | Admit: 2019-03-24 | Discharge: 2019-03-24 | Disposition: A | Discharge status: Home / Self Care | Payer: Medicare Other | Attending: Emergency Medicine | Admitting: Emergency Medicine

## 2019-03-24 DIAGNOSIS — I509 Heart failure, unspecified: Secondary | ICD-10-CM | POA: Insufficient documentation

## 2019-03-24 DIAGNOSIS — J449 Chronic obstructive pulmonary disease, unspecified: Secondary | ICD-10-CM | POA: Diagnosis not present

## 2019-03-24 DIAGNOSIS — Z21 Asymptomatic human immunodeficiency virus [HIV] infection status: Secondary | ICD-10-CM | POA: Insufficient documentation

## 2019-03-24 DIAGNOSIS — F17210 Nicotine dependence, cigarettes, uncomplicated: Secondary | ICD-10-CM | POA: Diagnosis not present

## 2019-03-24 DIAGNOSIS — I2510 Atherosclerotic heart disease of native coronary artery without angina pectoris: Secondary | ICD-10-CM | POA: Insufficient documentation

## 2019-03-24 DIAGNOSIS — Z79899 Other long term (current) drug therapy: Secondary | ICD-10-CM | POA: Diagnosis not present

## 2019-03-24 DIAGNOSIS — M545 Low back pain, unspecified: Secondary | ICD-10-CM

## 2019-03-24 DIAGNOSIS — I110 Hypertensive heart disease with heart failure: Secondary | ICD-10-CM | POA: Insufficient documentation

## 2019-03-24 MED ORDER — OXYCODONE-ACETAMINOPHEN 5-325 MG PO TABS
2.0000 | ORAL_TABLET | Freq: Once | ORAL | Status: AC
  Administered 2019-03-24: 2 via ORAL
  Filled 2019-03-24: qty 2

## 2019-03-24 NOTE — ED Triage Notes (Signed)
Pt arrives to ED from home with complaints of lower back pain after hearing a "pop" sound when picking up a couch this morning.
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
## 2019-03-24 NOTE — Discharge Instructions (Addendum)
Please read attached information. If you experience any new or worsening signs or symptoms please return to the emergency room for evaluation. Please follow-up with your primary care provider or specialist as discussed.  °

## 2019-03-24 NOTE — ED Notes (Signed)
Patient verbalizes understanding of discharge instructions. Opportunity for questioning and answers were provided. Armband removed by staff, pt discharged from ED.  

## 2019-04-22 DIAGNOSIS — M5417 Radiculopathy, lumbosacral region: Secondary | ICD-10-CM | POA: Insufficient documentation

## 2019-04-22 HISTORY — DX: Radiculopathy, lumbosacral region: M54.17

## 2019-05-18 ENCOUNTER — Encounter (HOSPITAL_COMMUNITY): Payer: Self-pay | Admitting: *Deleted

## 2019-05-18 ENCOUNTER — Other Ambulatory Visit: Payer: Self-pay

## 2019-05-18 ENCOUNTER — Emergency Department (HOSPITAL_COMMUNITY): Payer: Medicare Other

## 2019-05-18 ENCOUNTER — Emergency Department (HOSPITAL_COMMUNITY)
Admission: EM | Admit: 2019-05-18 | Discharge: 2019-05-18 | Disposition: A | Discharge status: Home / Self Care | Payer: Medicare Other | Attending: Emergency Medicine | Admitting: Emergency Medicine

## 2019-05-18 DIAGNOSIS — Z79899 Other long term (current) drug therapy: Secondary | ICD-10-CM | POA: Diagnosis not present

## 2019-05-18 DIAGNOSIS — M545 Low back pain, unspecified: Secondary | ICD-10-CM

## 2019-05-18 DIAGNOSIS — I509 Heart failure, unspecified: Secondary | ICD-10-CM | POA: Diagnosis not present

## 2019-05-18 DIAGNOSIS — J449 Chronic obstructive pulmonary disease, unspecified: Secondary | ICD-10-CM | POA: Insufficient documentation

## 2019-05-18 DIAGNOSIS — I110 Hypertensive heart disease with heart failure: Secondary | ICD-10-CM | POA: Insufficient documentation

## 2019-05-18 DIAGNOSIS — G8929 Other chronic pain: Secondary | ICD-10-CM

## 2019-05-18 DIAGNOSIS — J45909 Unspecified asthma, uncomplicated: Secondary | ICD-10-CM | POA: Diagnosis not present

## 2019-05-18 DIAGNOSIS — I2510 Atherosclerotic heart disease of native coronary artery without angina pectoris: Secondary | ICD-10-CM | POA: Diagnosis not present

## 2019-05-18 DIAGNOSIS — Z21 Asymptomatic human immunodeficiency virus [HIV] infection status: Secondary | ICD-10-CM | POA: Insufficient documentation

## 2019-05-18 DIAGNOSIS — F17210 Nicotine dependence, cigarettes, uncomplicated: Secondary | ICD-10-CM | POA: Diagnosis not present

## 2019-05-18 MED ORDER — METHOCARBAMOL 500 MG PO TABS: 500.0000 mg | ORAL_TABLET | Freq: Two times a day (BID) | ORAL | 0 refills | Status: DC

## 2019-05-18 MED ORDER — DIAZEPAM 5 MG/ML IJ SOLN
5.0000 mg | Freq: Once | INTRAMUSCULAR | Status: AC
  Administered 2019-05-18: 5 mg via INTRAMUSCULAR
  Filled 2019-05-18: qty 2

## 2019-05-18 MED ORDER — OXYCODONE-ACETAMINOPHEN 5-325 MG PO TABS
1.0000 | ORAL_TABLET | Freq: Once | ORAL | Status: AC
  Administered 2019-05-18: 16:00:00 1 via ORAL
  Filled 2019-05-18: qty 1

## 2019-05-18 MED ORDER — OXYCODONE-ACETAMINOPHEN 5-325 MG PO TABS: 1.0000 | ORAL_TABLET | Freq: Once | ORAL | Status: DC

## 2019-05-18 MED ORDER — OXYCODONE-ACETAMINOPHEN 5-325 MG PO TABS: 2.0000 | ORAL_TABLET | Freq: Once | ORAL | Status: DC

## 2019-05-18 NOTE — Discharge Instructions (Signed)
You can take 1000 mg of Tylenol.  Do not exceed 4000 mg of Tylenol a day.  Take Robaxin as prescribed. This medication will make you drowsy so do not drive or drink alcohol when taking it.  Follow-up with [facility] to establish a primary care doctor if you do not have one.   Return to the Emergency Department immediately for any worsening back pain, neck pain, difficulty walking, numbness/weaknss of your arms or legs, urinary or bowel accidents, fever or any other worsening or concerning symptoms.

## 2019-05-18 NOTE — ED Triage Notes (Signed)
PT reports he has on going back pain and now has bil. Leg pain in calf muscles that is new
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
## 2019-06-08 ENCOUNTER — Encounter (HOSPITAL_COMMUNITY): Payer: Self-pay

## 2019-06-08 ENCOUNTER — Other Ambulatory Visit: Payer: Self-pay

## 2019-06-08 ENCOUNTER — Emergency Department (HOSPITAL_COMMUNITY)
Admission: EM | Admit: 2019-06-08 | Discharge: 2019-06-08 | Disposition: A | Discharge status: Home / Self Care | Payer: Medicare Other | Attending: Emergency Medicine | Admitting: Emergency Medicine

## 2019-06-08 DIAGNOSIS — J45909 Unspecified asthma, uncomplicated: Secondary | ICD-10-CM | POA: Insufficient documentation

## 2019-06-08 DIAGNOSIS — Z79899 Other long term (current) drug therapy: Secondary | ICD-10-CM | POA: Diagnosis not present

## 2019-06-08 DIAGNOSIS — M545 Low back pain, unspecified: Secondary | ICD-10-CM

## 2019-06-08 DIAGNOSIS — M549 Dorsalgia, unspecified: Secondary | ICD-10-CM | POA: Diagnosis present

## 2019-06-08 DIAGNOSIS — I2510 Atherosclerotic heart disease of native coronary artery without angina pectoris: Secondary | ICD-10-CM | POA: Diagnosis not present

## 2019-06-08 DIAGNOSIS — I110 Hypertensive heart disease with heart failure: Secondary | ICD-10-CM | POA: Diagnosis not present

## 2019-06-08 DIAGNOSIS — F17210 Nicotine dependence, cigarettes, uncomplicated: Secondary | ICD-10-CM | POA: Diagnosis not present

## 2019-06-08 DIAGNOSIS — G8929 Other chronic pain: Secondary | ICD-10-CM | POA: Diagnosis not present

## 2019-06-08 DIAGNOSIS — B20 Human immunodeficiency virus [HIV] disease: Secondary | ICD-10-CM | POA: Insufficient documentation

## 2019-06-08 DIAGNOSIS — I509 Heart failure, unspecified: Secondary | ICD-10-CM | POA: Diagnosis not present

## 2019-06-08 DIAGNOSIS — F1410 Cocaine abuse, uncomplicated: Secondary | ICD-10-CM | POA: Diagnosis not present

## 2019-06-08 DIAGNOSIS — J449 Chronic obstructive pulmonary disease, unspecified: Secondary | ICD-10-CM | POA: Insufficient documentation

## 2019-06-08 MED ORDER — LIDOCAINE 5 % EX PTCH: 1.0000 | MEDICATED_PATCH | CUTANEOUS | 0 refills | Status: DC

## 2019-06-08 MED ORDER — OXYCODONE-ACETAMINOPHEN 5-325 MG PO TABS
2.0000 | ORAL_TABLET | Freq: Once | ORAL | Status: AC
  Administered 2019-06-08: 2 via ORAL
  Filled 2019-06-08: qty 2

## 2019-06-08 MED ORDER — METHOCARBAMOL 500 MG PO TABS
500.0000 mg | ORAL_TABLET | Freq: Once | ORAL | Status: AC
  Administered 2019-06-08: 500 mg via ORAL
  Filled 2019-06-08: qty 1

## 2019-06-08 MED ORDER — METHOCARBAMOL 500 MG PO TABS: 500.0000 mg | ORAL_TABLET | Freq: Two times a day (BID) | ORAL | 0 refills | Status: DC

## 2019-06-08 NOTE — ED Triage Notes (Signed)
Pt states that he is still having back pain. Pt states that he went to the pain clinic, but insurance has not gone through yet.
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
## 2019-06-08 NOTE — ED Notes (Signed)
ED Provider at bedside. 

## 2019-06-08 NOTE — Discharge Instructions (Signed)
It was my pleasure taking care of you today!   Please call the doctor currently managing your back pain to discuss your ED visit and see if they would like you to have a sooner follow up appointment than scheduled.   COLD THERAPY DIRECTIONS:  Ice or gel packs can be used to reduce both pain and swelling. Ice is the most helpful within the first 24 to 48 hours after an injury or flareup from overusing a muscle or joint.  Ice is effective, has very few side effects, and is safe for most people to use.   Return to the ED for worsening back pain, fever, weakness of either leg, or if you develop either (1) an inability to urinate or have bowel movements, or (2) loss of your ability to control your bathroom functions (if you start having "accidents"), or if you develop other new symptoms that concern you.

## 2019-07-23 ENCOUNTER — Emergency Department (HOSPITAL_COMMUNITY): Payer: Medicare Other

## 2019-07-23 ENCOUNTER — Emergency Department (HOSPITAL_COMMUNITY)
Admission: EM | Admit: 2019-07-23 | Discharge: 2019-07-23 | Disposition: A | Discharge status: Home / Self Care | Payer: Medicare Other | Attending: Emergency Medicine | Admitting: Emergency Medicine

## 2019-07-23 DIAGNOSIS — Z5320 Procedure and treatment not carried out because of patient's decision for unspecified reasons: Secondary | ICD-10-CM | POA: Diagnosis not present

## 2019-07-23 DIAGNOSIS — R52 Pain, unspecified: Secondary | ICD-10-CM

## 2019-07-23 MED ORDER — ACETAMINOPHEN 325 MG PO TABS: 650.0000 mg | ORAL_TABLET | Freq: Once | ORAL | Status: DC

## 2019-07-23 NOTE — ED Notes (Signed)
Pt not in room, belongings absent.

## 2019-07-23 NOTE — ED Triage Notes (Signed)
Pt reports generalized bodyaches, subjective fever aqt home, some cough. Pt alert, oriented x4, NAD at present. VSS.
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
## 2019-07-25 ENCOUNTER — Emergency Department (HOSPITAL_COMMUNITY)
Admission: EM | Admit: 2019-07-25 | Discharge: 2019-07-25 | Disposition: A | Discharge status: Home / Self Care | Payer: Medicare Other | Attending: Emergency Medicine | Admitting: Emergency Medicine

## 2019-07-25 ENCOUNTER — Emergency Department (HOSPITAL_COMMUNITY): Payer: Medicare Other

## 2019-07-25 ENCOUNTER — Encounter (HOSPITAL_COMMUNITY): Payer: Self-pay

## 2019-07-25 DIAGNOSIS — F17210 Nicotine dependence, cigarettes, uncomplicated: Secondary | ICD-10-CM | POA: Insufficient documentation

## 2019-07-25 DIAGNOSIS — J449 Chronic obstructive pulmonary disease, unspecified: Secondary | ICD-10-CM | POA: Diagnosis not present

## 2019-07-25 DIAGNOSIS — B20 Human immunodeficiency virus [HIV] disease: Secondary | ICD-10-CM | POA: Insufficient documentation

## 2019-07-25 DIAGNOSIS — Z79899 Other long term (current) drug therapy: Secondary | ICD-10-CM | POA: Diagnosis not present

## 2019-07-25 DIAGNOSIS — Z20828 Contact with and (suspected) exposure to other viral communicable diseases: Secondary | ICD-10-CM | POA: Insufficient documentation

## 2019-07-25 DIAGNOSIS — M5440 Lumbago with sciatica, unspecified side: Secondary | ICD-10-CM | POA: Insufficient documentation

## 2019-07-25 DIAGNOSIS — I509 Heart failure, unspecified: Secondary | ICD-10-CM | POA: Insufficient documentation

## 2019-07-25 DIAGNOSIS — M791 Myalgia, unspecified site: Secondary | ICD-10-CM | POA: Diagnosis present

## 2019-07-25 DIAGNOSIS — I110 Hypertensive heart disease with heart failure: Secondary | ICD-10-CM | POA: Diagnosis not present

## 2019-07-25 LAB — COMPREHENSIVE METABOLIC PANEL
ALT: 14 U/L (ref 0–44)
AST: 16 U/L (ref 15–41)
Albumin: 4.3 g/dL (ref 3.5–5.0)
Alkaline Phosphatase: 100 U/L (ref 38–126)
Anion gap: 8 (ref 5–15)
BUN: 22 mg/dL (ref 8–23)
CO2: 25 mmol/L (ref 22–32)
Calcium: 9.6 mg/dL (ref 8.9–10.3)
Chloride: 104 mmol/L (ref 98–111)
Creatinine, Ser: 1.04 mg/dL (ref 0.61–1.24)
GFR calc Af Amer: >60 mL/min (ref >60)
GFR calc non Af Amer: >60 mL/min (ref >60)
Glucose, Bld: 94 mg/dL (ref 70–99)
Potassium: 3.9 mmol/L (ref 3.5–5.1)
Sodium: 137 mmol/L (ref 135–145)
Total Bilirubin: 0.6 mg/dL (ref 0.3–1.2)
Total Protein: 8.2 g/dL — ABNORMAL HIGH (ref 6.5–8.1)

## 2019-07-25 LAB — CBC WITH DIFFERENTIAL/PLATELET
Abs Immature Granulocytes: 0.01 10*3/uL (ref 0.00–0.07)
Basophils Absolute: 0 10*3/uL (ref 0.0–0.1)
Basophils Relative: 1 %
Eosinophils Absolute: 0 10*3/uL (ref 0.0–0.5)
Eosinophils Relative: 1 %
HCT: 47.8 % (ref 39.0–52.0)
Hemoglobin: 15.1 g/dL (ref 13.0–17.0)
Immature Granulocytes: 0 %
Lymphocytes Relative: 29 %
Lymphs Abs: 1.7 10*3/uL (ref 0.7–4.0)
MCH: 27.3 pg (ref 26.0–34.0)
MCHC: 31.6 g/dL (ref 30.0–36.0)
MCV: 86.3 fL (ref 80.0–100.0)
Monocytes Absolute: 0.6 10*3/uL (ref 0.1–1.0)
Monocytes Relative: 10 %
Neutro Abs: 3.5 10*3/uL (ref 1.7–7.7)
Neutrophils Relative %: 59 %
Platelets: 274 10*3/uL (ref 150–400)
RBC: 5.54 MIL/uL (ref 4.22–5.81)
RDW: 14.9 % (ref 11.5–15.5)
WBC: 5.8 10*3/uL (ref 4.0–10.5)
nRBC: 0 % (ref 0.0–0.2)

## 2019-07-25 LAB — URINALYSIS, ROUTINE W REFLEX MICROSCOPIC
Bilirubin Urine: NEGATIVE
Glucose, UA: NEGATIVE mg/dL
Hgb urine dipstick: NEGATIVE
Ketones, ur: NEGATIVE mg/dL
Leukocytes,Ua: NEGATIVE
Nitrite: NEGATIVE
Protein, ur: NEGATIVE mg/dL
Specific Gravity, Urine: 1.01 (ref 1.005–1.030)
pH: 6 (ref 5.0–8.0)

## 2019-07-25 LAB — LACTIC ACID, PLASMA: Lactic Acid, Venous: 1.1 mmol/L (ref 0.5–1.9)

## 2019-07-25 LAB — SEDIMENTATION RATE: Sed Rate: 2 mm/hr (ref 0–16)

## 2019-07-25 LAB — SARS CORONAVIRUS 2 (TAT 6-24 HRS): SARS Coronavirus 2: NEGATIVE

## 2019-07-25 LAB — CK: Total CK: 101 U/L (ref 49–397)

## 2019-07-25 LAB — TROPONIN I (HIGH SENSITIVITY)
Troponin I (High Sensitivity): 3 ng/L (ref <18)
Troponin I (High Sensitivity): 3 ng/L (ref <18)

## 2019-07-25 LAB — C-REACTIVE PROTEIN: CRP: <0.8 mg/dL (ref <1.0)

## 2019-07-25 MED ORDER — OXYCODONE-ACETAMINOPHEN 5-325 MG PO TABS: 1.0000 | ORAL_TABLET | Freq: Four times a day (QID) | ORAL | 0 refills | Status: DC | PRN

## 2019-07-25 MED ORDER — PREDNISONE 20 MG PO TABS: ORAL_TABLET | ORAL | 0 refills | Status: DC

## 2019-07-25 MED ORDER — GADOBUTROL 1 MMOL/ML IV SOLN
7.0000 mL | Freq: Once | INTRAVENOUS | Status: AC | PRN
  Administered 2019-07-25: 7 mL via INTRAVENOUS

## 2019-07-25 MED ORDER — FENTANYL CITRATE (PF) 100 MCG/2ML IJ SOLN
100.0000 ug | Freq: Once | INTRAMUSCULAR | Status: AC
  Administered 2019-07-25: 100 ug via INTRAVENOUS
  Filled 2019-07-25: qty 2

## 2019-07-25 MED ORDER — SODIUM CHLORIDE 0.9 % IV BOLUS
1000.0000 mL | Freq: Once | INTRAVENOUS | Status: AC
  Administered 2019-07-25: 1000 mL via INTRAVENOUS

## 2019-07-25 MED ORDER — FENTANYL CITRATE (PF) 100 MCG/2ML IJ SOLN
50.0000 ug | INTRAMUSCULAR | Status: DC | PRN
  Administered 2019-07-25 (×4): 50 ug via INTRAVENOUS
  Filled 2019-07-25 (×4): qty 2

## 2019-07-25 NOTE — ED Notes (Signed)
Pt transported to MRI 

## 2019-07-25 NOTE — ED Notes (Signed)
Patient requesting food. PA made aware.

## 2019-07-25 NOTE — Discharge Instructions (Signed)
Please read and follow all provided instructions.  Your diagnoses today include:  1. Acute bilateral low back pain with sciatica, sciatica laterality unspecified   2. Myalgia     Tests performed today include:  Vital signs - see below for your results today  Blood counts and electrolytes  Coronavirus testing -was negative  Chest x-ray -pneumonia  Urine test-no infection  MRI of your lower back -does not show any infections, shows worsening of bulging disks in your back likely causing pain  Medications prescribed:   Prednisone - steroid medicine   It is best to take this medication in the morning to prevent sleeping problems. If you are diabetic, monitor your blood sugar closely and stop taking Prednisone if blood sugar is over 300. Take with food to prevent stomach upset.    Percocet (oxycodone/acetaminophen) - narcotic pain medication  DO NOT drive or perform any activities that require you to be awake and alert because this medicine can make you drowsy. BE VERY CAREFUL not to take multiple medicines containing Tylenol (also called acetaminophen). Doing so can lead to an overdose which can damage your liver and cause liver failure and possibly death.  Take any prescribed medications only as directed.  Home care instructions:   Follow any educational materials contained in this packet  Please rest, use ice or heat on your back for the next several days  Do not lift, push, pull anything more than 10 pounds for the next week  Follow-up instructions: Please follow-up with the neurosurgeon listed when you are able for further evaluation and treatment of your back pain.  Return instructions:  SEEK IMMEDIATE MEDICAL ATTENTION IF YOU HAVE:  New numbness, tingling, weakness, or problem with the use of your arms or legs  Severe back pain not relieved with medications  Loss control of your bowels or bladder  Increasing pain in any areas of the body (such as chest or  abdominal pain)  Shortness of breath, dizziness, or fainting.   Worsening nausea (feeling sick to your stomach), vomiting, fever, or sweats  Any other emergent concerns regarding your health   Additional Information:  Your vital signs today were: BP (!) 129/100    Pulse 96    Temp 98 F (36.7 C) (Oral)    Resp 18    SpO2 100%  If your blood pressure (BP) was elevated above 135/85 this visit, please have this repeated by your doctor within one month. --------------
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
## 2019-07-25 NOTE — Progress Notes (Signed)
TOC CM spoke to pt and states he has a PCP, [facility]. [place], [place] ED TOC CM [phone]

## 2019-07-25 NOTE — ED Notes (Signed)
MD at bedside. 

## 2019-07-25 NOTE — ED Notes (Signed)
Patient given sandwich and water

## 2019-07-25 NOTE — ED Triage Notes (Addendum)
Patient brought in by wife.   C/o lower back pain that radiates to shoulders and body aches X9 days.   10/10 pain discomfort   Denies falling or injuries    A/Ox4  Wheelchair in triage.  Patient wearing his own back brace.   Hx. Chronic lower back pain per chart

## 2019-07-25 NOTE — Progress Notes (Signed)
Attempted to call ED 2x with no answer. Will need to move on to next STAT and come back to Mr. Shami later on.

## 2019-07-29 ENCOUNTER — Ambulatory Visit: Payer: Medicare Other

## 2019-07-29 ENCOUNTER — Other Ambulatory Visit: Payer: Medicare Other

## 2019-07-30 ENCOUNTER — Encounter (HOSPITAL_COMMUNITY): Payer: Self-pay

## 2019-07-30 ENCOUNTER — Other Ambulatory Visit: Payer: Self-pay

## 2019-07-30 ENCOUNTER — Emergency Department (HOSPITAL_COMMUNITY)
Admission: EM | Admit: 2019-07-30 | Discharge: 2019-07-30 | Disposition: A | Discharge status: Home / Self Care | Payer: Medicare Other | Attending: Emergency Medicine | Admitting: Emergency Medicine

## 2019-07-30 ENCOUNTER — Other Ambulatory Visit: Payer: Self-pay | Admitting: *Deleted

## 2019-07-30 DIAGNOSIS — M545 Low back pain, unspecified: Secondary | ICD-10-CM

## 2019-07-30 DIAGNOSIS — I509 Heart failure, unspecified: Secondary | ICD-10-CM | POA: Insufficient documentation

## 2019-07-30 DIAGNOSIS — F17210 Nicotine dependence, cigarettes, uncomplicated: Secondary | ICD-10-CM | POA: Diagnosis not present

## 2019-07-30 DIAGNOSIS — Z79899 Other long term (current) drug therapy: Secondary | ICD-10-CM | POA: Diagnosis not present

## 2019-07-30 DIAGNOSIS — Z21 Asymptomatic human immunodeficiency virus [HIV] infection status: Secondary | ICD-10-CM | POA: Diagnosis not present

## 2019-07-30 DIAGNOSIS — M5489 Other dorsalgia: Secondary | ICD-10-CM | POA: Diagnosis present

## 2019-07-30 DIAGNOSIS — I110 Hypertensive heart disease with heart failure: Secondary | ICD-10-CM | POA: Diagnosis not present

## 2019-07-30 DIAGNOSIS — M5136 Other intervertebral disc degeneration, lumbar region: Secondary | ICD-10-CM | POA: Diagnosis not present

## 2019-07-30 DIAGNOSIS — M519 Unspecified thoracic, thoracolumbar and lumbosacral intervertebral disc disorder: Secondary | ICD-10-CM

## 2019-07-30 DIAGNOSIS — J449 Chronic obstructive pulmonary disease, unspecified: Secondary | ICD-10-CM | POA: Insufficient documentation

## 2019-07-30 DIAGNOSIS — Z113 Encounter for screening for infections with a predominantly sexual mode of transmission: Secondary | ICD-10-CM

## 2019-07-30 DIAGNOSIS — Z981 Arthrodesis status: Secondary | ICD-10-CM | POA: Insufficient documentation

## 2019-07-30 MED ORDER — GABAPENTIN 100 MG PO CAPS: 100.0000 mg | ORAL_CAPSULE | Freq: Three times a day (TID) | ORAL | 0 refills | Status: DC

## 2019-07-30 MED ORDER — GABAPENTIN 300 MG PO CAPS
300.0000 mg | ORAL_CAPSULE | Freq: Once | ORAL | Status: AC
  Administered 2019-07-30: 300 mg via ORAL
  Filled 2019-07-30: qty 1

## 2019-07-30 MED ORDER — OXYCODONE-ACETAMINOPHEN 5-325 MG PO TABS: 1.0000 | ORAL_TABLET | Freq: Four times a day (QID) | ORAL | 0 refills | Status: DC | PRN

## 2019-07-30 MED ORDER — ALBUTEROL SULFATE HFA 108 (90 BASE) MCG/ACT IN AERS
2.0000 | INHALATION_SPRAY | RESPIRATORY_TRACT | Status: DC | PRN
  Administered 2019-07-30: 2 via RESPIRATORY_TRACT
  Filled 2019-07-30: qty 6.7

## 2019-07-30 MED ORDER — HYDROMORPHONE HCL 2 MG/ML IJ SOLN
2.0000 mg | Freq: Once | INTRAMUSCULAR | Status: AC
  Administered 2019-07-30: 2 mg via INTRAMUSCULAR
  Filled 2019-07-30: qty 1

## 2019-07-30 NOTE — ED Triage Notes (Signed)
Pt complains of lower back pain that is chronic, he states that it started hurting last night with no relief with home meds

## 2019-07-30 NOTE — Discharge Instructions (Signed)
Try to be as active as possible so you do not get stiff.  Take the pain medication only when the pain is severe and take the gabapentin regularly.  It is very important for you to follow-up with the specialist in a regular doctor to have ongoing management.
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
## 2019-07-31 ENCOUNTER — Other Ambulatory Visit: Payer: Medicare Other

## 2019-07-31 ENCOUNTER — Ambulatory Visit: Payer: Medicare Other

## 2019-07-31 DIAGNOSIS — B20 Human immunodeficiency virus [HIV] disease: Secondary | ICD-10-CM

## 2019-07-31 DIAGNOSIS — Z113 Encounter for screening for infections with a predominantly sexual mode of transmission: Secondary | ICD-10-CM

## 2019-07-31 DIAGNOSIS — Z21 Asymptomatic human immunodeficiency virus [HIV] infection status: Secondary | ICD-10-CM

## 2019-08-01 ENCOUNTER — Emergency Department (HOSPITAL_COMMUNITY)
Admission: EM | Admit: 2019-08-01 | Discharge: 2019-08-01 | Disposition: A | Discharge status: Home / Self Care | Payer: Medicare Other | Attending: Emergency Medicine | Admitting: Emergency Medicine

## 2019-08-01 ENCOUNTER — Other Ambulatory Visit: Payer: Self-pay

## 2019-08-01 ENCOUNTER — Encounter (HOSPITAL_COMMUNITY): Payer: Self-pay | Admitting: Emergency Medicine

## 2019-08-01 DIAGNOSIS — F17210 Nicotine dependence, cigarettes, uncomplicated: Secondary | ICD-10-CM | POA: Diagnosis not present

## 2019-08-01 DIAGNOSIS — J449 Chronic obstructive pulmonary disease, unspecified: Secondary | ICD-10-CM | POA: Diagnosis not present

## 2019-08-01 DIAGNOSIS — M791 Myalgia, unspecified site: Secondary | ICD-10-CM | POA: Diagnosis not present

## 2019-08-01 DIAGNOSIS — Z79899 Other long term (current) drug therapy: Secondary | ICD-10-CM | POA: Insufficient documentation

## 2019-08-01 DIAGNOSIS — I509 Heart failure, unspecified: Secondary | ICD-10-CM | POA: Insufficient documentation

## 2019-08-01 DIAGNOSIS — I2510 Atherosclerotic heart disease of native coronary artery without angina pectoris: Secondary | ICD-10-CM | POA: Diagnosis not present

## 2019-08-01 DIAGNOSIS — M549 Dorsalgia, unspecified: Secondary | ICD-10-CM | POA: Diagnosis not present

## 2019-08-01 DIAGNOSIS — G8929 Other chronic pain: Secondary | ICD-10-CM | POA: Diagnosis not present

## 2019-08-01 DIAGNOSIS — I110 Hypertensive heart disease with heart failure: Secondary | ICD-10-CM | POA: Diagnosis not present

## 2019-08-01 DIAGNOSIS — Z21 Asymptomatic human immunodeficiency virus [HIV] infection status: Secondary | ICD-10-CM | POA: Insufficient documentation

## 2019-08-01 LAB — T-HELPER CELL (CD4) - (RCID CLINIC ONLY)
CD4 % Helper T Cell: 49 % (ref 33–65)
CD4 T Cell Abs: 951 /uL (ref 400–1790)

## 2019-08-01 MED ORDER — HYDROMORPHONE HCL 2 MG/ML IJ SOLN
2.0000 mg | Freq: Once | INTRAMUSCULAR | Status: AC
  Administered 2019-08-01: 2 mg via INTRAMUSCULAR
  Filled 2019-08-01: qty 1

## 2019-08-01 NOTE — Discharge Instructions (Addendum)
Continue taking the gabapentin that you are prescribed on your last visit.  You may also take 500 to 650 mg of Tylenol every 6 hours for your pain.  You may also buy over-the-counter salon pas patches, please use as directed.  You may also use warm compresses to help with your symptoms.  Please keep your appointment with the neuro surgeon and return to the emergency department for new or worsening symptoms including weakness, numbness, loss control of bowel or bladder function

## 2019-08-01 NOTE — ED Triage Notes (Signed)
Patient reports pain all over body x12 days. States history of chronic pains. Denies cough, fever, chest pain, SOB, N/V/D.
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
## 2019-08-01 NOTE — ED Notes (Signed)
Patient ambulatory to restroom without difficulty. 

## 2019-08-02 ENCOUNTER — Other Ambulatory Visit: Payer: Self-pay

## 2019-08-02 ENCOUNTER — Encounter (HOSPITAL_COMMUNITY): Payer: Self-pay | Admitting: Emergency Medicine

## 2019-08-02 ENCOUNTER — Emergency Department (HOSPITAL_COMMUNITY)
Admission: EM | Admit: 2019-08-02 | Discharge: 2019-08-02 | Disposition: A | Discharge status: Home / Self Care | Payer: Medicare Other | Attending: Emergency Medicine | Admitting: Emergency Medicine

## 2019-08-02 DIAGNOSIS — I509 Heart failure, unspecified: Secondary | ICD-10-CM | POA: Diagnosis not present

## 2019-08-02 DIAGNOSIS — M5442 Lumbago with sciatica, left side: Secondary | ICD-10-CM | POA: Insufficient documentation

## 2019-08-02 DIAGNOSIS — I110 Hypertensive heart disease with heart failure: Secondary | ICD-10-CM | POA: Diagnosis not present

## 2019-08-02 DIAGNOSIS — Z79899 Other long term (current) drug therapy: Secondary | ICD-10-CM | POA: Diagnosis not present

## 2019-08-02 DIAGNOSIS — M5441 Lumbago with sciatica, right side: Secondary | ICD-10-CM | POA: Insufficient documentation

## 2019-08-02 DIAGNOSIS — J449 Chronic obstructive pulmonary disease, unspecified: Secondary | ICD-10-CM | POA: Insufficient documentation

## 2019-08-02 DIAGNOSIS — B20 Human immunodeficiency virus [HIV] disease: Secondary | ICD-10-CM | POA: Diagnosis not present

## 2019-08-02 DIAGNOSIS — M545 Low back pain: Secondary | ICD-10-CM | POA: Diagnosis present

## 2019-08-02 DIAGNOSIS — F17210 Nicotine dependence, cigarettes, uncomplicated: Secondary | ICD-10-CM | POA: Insufficient documentation

## 2019-08-02 MED ORDER — METHOCARBAMOL 500 MG PO TABS: 500.0000 mg | ORAL_TABLET | Freq: Two times a day (BID) | ORAL | 0 refills | Status: DC

## 2019-08-02 MED ORDER — HYDROMORPHONE HCL 1 MG/ML IJ SOLN
1.0000 mg | Freq: Once | INTRAMUSCULAR | Status: AC
  Administered 2019-08-02: 1 mg via INTRAMUSCULAR
  Filled 2019-08-02: qty 1

## 2019-08-02 MED ORDER — METHOCARBAMOL 500 MG PO TABS
1000.0000 mg | ORAL_TABLET | Freq: Once | ORAL | Status: AC
  Administered 2019-08-02: 1000 mg via ORAL
  Filled 2019-08-02: qty 2

## 2019-08-02 MED ORDER — LIDOCAINE 5 % EX PTCH
1.0000 | MEDICATED_PATCH | CUTANEOUS | Status: DC
  Administered 2019-08-02: 1 via TRANSDERMAL
  Filled 2019-08-02: qty 1

## 2019-08-02 MED ORDER — LIDOCAINE 5 % EX PTCH: 1.0000 | MEDICATED_PATCH | CUTANEOUS | 0 refills | Status: DC

## 2019-08-02 NOTE — Discharge Instructions (Addendum)
Continue taking home medications as prescribed. Use Lidoderm to help with pain. Use the muscle relaxer to help with pain. Continue using Tylenol for pain. Follow-up with the [facility] health and wellness clinic listed below. Follow-up with the neurosurgeon at your scheduled appointment. Return to the emergency room if you develop fevers, loss of bowel bladder control, numbness, or any new, worsening, or concerning symptoms.
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
## 2019-08-02 NOTE — ED Notes (Signed)
rx x 2 given

## 2019-08-02 NOTE — ED Notes (Signed)
PT STATES HE DOES HAVE A RIDE

## 2019-08-02 NOTE — ED Triage Notes (Signed)
Pt c/o worsening back pain. Pt has had chronic back pain since back injury in 1995 and multiple surgeries. Pt states he has only been taking Tylenol without relief and is now having bilateral leg weakness.

## 2019-08-03 ENCOUNTER — Emergency Department (HOSPITAL_COMMUNITY)
Admission: EM | Admit: 2019-08-03 | Discharge: 2019-08-03 | Disposition: A | Discharge status: Home / Self Care | Payer: Medicare Other | Attending: Emergency Medicine | Admitting: Emergency Medicine

## 2019-08-03 ENCOUNTER — Emergency Department (HOSPITAL_COMMUNITY): Payer: Medicare Other

## 2019-08-03 ENCOUNTER — Encounter (HOSPITAL_COMMUNITY): Payer: Self-pay

## 2019-08-03 ENCOUNTER — Other Ambulatory Visit: Payer: Self-pay

## 2019-08-03 DIAGNOSIS — J449 Chronic obstructive pulmonary disease, unspecified: Secondary | ICD-10-CM | POA: Diagnosis not present

## 2019-08-03 DIAGNOSIS — Z79899 Other long term (current) drug therapy: Secondary | ICD-10-CM | POA: Diagnosis not present

## 2019-08-03 DIAGNOSIS — Z21 Asymptomatic human immunodeficiency virus [HIV] infection status: Secondary | ICD-10-CM | POA: Insufficient documentation

## 2019-08-03 DIAGNOSIS — I110 Hypertensive heart disease with heart failure: Secondary | ICD-10-CM | POA: Diagnosis not present

## 2019-08-03 DIAGNOSIS — G8929 Other chronic pain: Secondary | ICD-10-CM | POA: Insufficient documentation

## 2019-08-03 DIAGNOSIS — I2510 Atherosclerotic heart disease of native coronary artery without angina pectoris: Secondary | ICD-10-CM | POA: Diagnosis not present

## 2019-08-03 DIAGNOSIS — M7918 Myalgia, other site: Secondary | ICD-10-CM | POA: Insufficient documentation

## 2019-08-03 DIAGNOSIS — I509 Heart failure, unspecified: Secondary | ICD-10-CM | POA: Diagnosis not present

## 2019-08-03 DIAGNOSIS — R079 Chest pain, unspecified: Secondary | ICD-10-CM | POA: Insufficient documentation

## 2019-08-03 DIAGNOSIS — F17210 Nicotine dependence, cigarettes, uncomplicated: Secondary | ICD-10-CM | POA: Diagnosis not present

## 2019-08-03 LAB — CBC
HCT: 47.5 % (ref 39.0–52.0)
Hemoglobin: 15.2 g/dL (ref 13.0–17.0)
MCH: 27.6 pg (ref 26.0–34.0)
MCHC: 32 g/dL (ref 30.0–36.0)
MCV: 86.4 fL (ref 80.0–100.0)
Platelets: 261 10*3/uL (ref 150–400)
RBC: 5.5 MIL/uL (ref 4.22–5.81)
RDW: 15.7 % — ABNORMAL HIGH (ref 11.5–15.5)
WBC: 17.6 10*3/uL — ABNORMAL HIGH (ref 4.0–10.5)
nRBC: 0 % (ref 0.0–0.2)

## 2019-08-03 LAB — TROPONIN I (HIGH SENSITIVITY)
Troponin I (High Sensitivity): 3 ng/L (ref <18)
Troponin I (High Sensitivity): 2 ng/L (ref <18)

## 2019-08-03 LAB — BASIC METABOLIC PANEL
Anion gap: 10 (ref 5–15)
BUN: 18 mg/dL (ref 8–23)
CO2: 23 mmol/L (ref 22–32)
Calcium: 9.9 mg/dL (ref 8.9–10.3)
Chloride: 104 mmol/L (ref 98–111)
Creatinine, Ser: 0.95 mg/dL (ref 0.61–1.24)
GFR calc Af Amer: >60 mL/min (ref >60)
GFR calc non Af Amer: >60 mL/min (ref >60)
Glucose, Bld: 98 mg/dL (ref 70–99)
Potassium: 3.9 mmol/L (ref 3.5–5.1)
Sodium: 137 mmol/L (ref 135–145)

## 2019-08-03 MED ORDER — ACETAMINOPHEN 325 MG PO TABS
650.0000 mg | ORAL_TABLET | Freq: Once | ORAL | Status: AC
  Administered 2019-08-03: 650 mg via ORAL
  Filled 2019-08-03: qty 2

## 2019-08-03 MED ORDER — SODIUM CHLORIDE 0.9% FLUSH: 3.0000 mL | Freq: Once | INTRAVENOUS | Status: DC

## 2019-08-03 NOTE — ED Notes (Signed)
Pt given urinal and informed that we need a urine sample  
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
## 2019-08-03 NOTE — ED Notes (Signed)
Patient reminded that we need urine. Urinal at bedside.

## 2019-08-03 NOTE — ED Triage Notes (Signed)
Pt states he has had continuous chest pain since yesterday at 1700. Pt states he is having SOB, "hard to take a full breath". Pt states "feels like something is sitting on my chest". Pt states he has been taking blood pressure meds as prescribed. Pt denies being on blood thinners.

## 2019-08-03 NOTE — ED Notes (Signed)
Pt left before receiving paperwork

## 2019-08-03 NOTE — Discharge Instructions (Signed)
Please return for any problem.  Follow-up with your regular care provider as instructed. °

## 2019-08-04 ENCOUNTER — Other Ambulatory Visit: Payer: Self-pay

## 2019-08-04 ENCOUNTER — Emergency Department (HOSPITAL_COMMUNITY)
Admission: EM | Admit: 2019-08-04 | Discharge: 2019-08-04 | Disposition: A | Discharge status: Home / Self Care | Payer: Medicare Other | Attending: Emergency Medicine | Admitting: Emergency Medicine

## 2019-08-04 ENCOUNTER — Encounter (HOSPITAL_COMMUNITY): Payer: Self-pay

## 2019-08-04 DIAGNOSIS — I509 Heart failure, unspecified: Secondary | ICD-10-CM | POA: Diagnosis not present

## 2019-08-04 DIAGNOSIS — M545 Low back pain, unspecified: Secondary | ICD-10-CM

## 2019-08-04 DIAGNOSIS — Z79899 Other long term (current) drug therapy: Secondary | ICD-10-CM | POA: Insufficient documentation

## 2019-08-04 DIAGNOSIS — I110 Hypertensive heart disease with heart failure: Secondary | ICD-10-CM | POA: Diagnosis not present

## 2019-08-04 DIAGNOSIS — G8929 Other chronic pain: Secondary | ICD-10-CM

## 2019-08-04 DIAGNOSIS — Z21 Asymptomatic human immunodeficiency virus [HIV] infection status: Secondary | ICD-10-CM | POA: Diagnosis not present

## 2019-08-04 DIAGNOSIS — J449 Chronic obstructive pulmonary disease, unspecified: Secondary | ICD-10-CM | POA: Diagnosis not present

## 2019-08-04 DIAGNOSIS — I2510 Atherosclerotic heart disease of native coronary artery without angina pectoris: Secondary | ICD-10-CM | POA: Insufficient documentation

## 2019-08-04 MED ORDER — HYDROMORPHONE HCL 1 MG/ML IJ SOLN
2.0000 mg | Freq: Once | INTRAMUSCULAR | Status: AC
  Administered 2019-08-04: 2 mg via INTRAMUSCULAR
  Filled 2019-08-04: qty 2

## 2019-08-04 NOTE — Discharge Instructions (Addendum)
Follow-up with your surgeons as scheduled.
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
## 2019-08-04 NOTE — ED Triage Notes (Signed)
Pt endorses lower back pain x 2 weeks, hx of 4 previous back surgeries. Sent from back dr across the street for pain control. Had MRI 2 weeks ago. Waiting to hear back from dr to see if surgery is approved. VSS

## 2019-08-06 ENCOUNTER — Other Ambulatory Visit: Payer: Self-pay

## 2019-08-06 ENCOUNTER — Encounter (HOSPITAL_COMMUNITY): Payer: Self-pay | Admitting: Emergency Medicine

## 2019-08-06 ENCOUNTER — Emergency Department (HOSPITAL_COMMUNITY)
Admission: EM | Admit: 2019-08-06 | Discharge: 2019-08-06 | Disposition: A | Discharge status: Home / Self Care | Payer: Medicare Other | Attending: Emergency Medicine | Admitting: Emergency Medicine

## 2019-08-06 DIAGNOSIS — B20 Human immunodeficiency virus [HIV] disease: Secondary | ICD-10-CM | POA: Diagnosis not present

## 2019-08-06 DIAGNOSIS — Z79899 Other long term (current) drug therapy: Secondary | ICD-10-CM | POA: Diagnosis not present

## 2019-08-06 DIAGNOSIS — J449 Chronic obstructive pulmonary disease, unspecified: Secondary | ICD-10-CM | POA: Insufficient documentation

## 2019-08-06 DIAGNOSIS — G8929 Other chronic pain: Secondary | ICD-10-CM | POA: Insufficient documentation

## 2019-08-06 DIAGNOSIS — F1410 Cocaine abuse, uncomplicated: Secondary | ICD-10-CM | POA: Diagnosis not present

## 2019-08-06 DIAGNOSIS — M5126 Other intervertebral disc displacement, lumbar region: Secondary | ICD-10-CM | POA: Diagnosis not present

## 2019-08-06 DIAGNOSIS — I509 Heart failure, unspecified: Secondary | ICD-10-CM | POA: Diagnosis not present

## 2019-08-06 DIAGNOSIS — I2510 Atherosclerotic heart disease of native coronary artery without angina pectoris: Secondary | ICD-10-CM | POA: Diagnosis not present

## 2019-08-06 DIAGNOSIS — M545 Low back pain, unspecified: Secondary | ICD-10-CM

## 2019-08-06 DIAGNOSIS — I110 Hypertensive heart disease with heart failure: Secondary | ICD-10-CM | POA: Diagnosis not present

## 2019-08-06 DIAGNOSIS — M5136 Other intervertebral disc degeneration, lumbar region: Secondary | ICD-10-CM

## 2019-08-06 DIAGNOSIS — M549 Dorsalgia, unspecified: Secondary | ICD-10-CM | POA: Diagnosis present

## 2019-08-06 MED ORDER — KETOROLAC TROMETHAMINE 15 MG/ML IJ SOLN
30.0000 mg | Freq: Once | INTRAMUSCULAR | Status: AC
  Administered 2019-08-06: 30 mg via INTRAMUSCULAR
  Filled 2019-08-06: qty 2

## 2019-08-06 MED ORDER — HYDROMORPHONE HCL 1 MG/ML IJ SOLN
1.0000 mg | Freq: Once | INTRAMUSCULAR | Status: AC
  Administered 2019-08-06: 1 mg via SUBCUTANEOUS
  Filled 2019-08-06: qty 1

## 2019-08-06 MED ORDER — LIDOCAINE 5 % EX PTCH
1.0000 | MEDICATED_PATCH | CUTANEOUS | Status: DC
  Administered 2019-08-06: 1 via TRANSDERMAL
  Filled 2019-08-06: qty 1

## 2019-08-06 MED ORDER — METHOCARBAMOL 500 MG PO TABS: 500.0000 mg | ORAL_TABLET | Freq: Two times a day (BID) | ORAL | 0 refills | Status: DC

## 2019-08-06 MED ORDER — DICLOFENAC SODIUM 1 % EX GEL: 2.0000 g | Freq: Four times a day (QID) | CUTANEOUS | 0 refills | Status: AC

## 2019-08-06 MED ORDER — LIDOCAINE 5 % EX PTCH: 1.0000 | MEDICATED_PATCH | CUTANEOUS | 0 refills | Status: DC

## 2019-08-06 MED ORDER — METHOCARBAMOL 1000 MG/10ML IJ SOLN
1000.0000 mg | Freq: Once | INTRAMUSCULAR | Status: AC
  Administered 2019-08-06: 1000 mg via INTRAMUSCULAR
  Filled 2019-08-06: qty 10

## 2019-08-06 MED ORDER — GABAPENTIN 100 MG PO CAPS: 100.0000 mg | ORAL_CAPSULE | Freq: Three times a day (TID) | ORAL | 0 refills | Status: DC

## 2019-08-06 NOTE — Discharge Instructions (Signed)
Please follow-up with your spine doctor 11/2 and keep your appointment with your primary care doctor 11/1. Please return to ED for new or concerning symptoms including the ones that we discussed such as numbness in her groin area, control of your bowel movements or urination, or any new or concerning symptoms.  I have refilled the prescription for Robaxin and topical NSAID Voltaren gel.  Also refilled your prescription for gabapentin.

## 2019-08-06 NOTE — ED Triage Notes (Signed)
Pt endorses back pain for 13 days. Reports talking to a surgeon today and seeing him 12/2. States he has multiple bulging discs.
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
## 2019-08-08 ENCOUNTER — Telehealth: Payer: Self-pay | Admitting: *Deleted

## 2019-08-08 ENCOUNTER — Other Ambulatory Visit: Payer: Self-pay

## 2019-08-08 ENCOUNTER — Emergency Department (HOSPITAL_COMMUNITY)
Admission: EM | Admit: 2019-08-08 | Discharge: 2019-08-08 | Disposition: A | Discharge status: Home / Self Care | Payer: Medicare Other | Attending: Emergency Medicine | Admitting: Emergency Medicine

## 2019-08-08 ENCOUNTER — Encounter (HOSPITAL_COMMUNITY): Payer: Self-pay | Admitting: Emergency Medicine

## 2019-08-08 DIAGNOSIS — J449 Chronic obstructive pulmonary disease, unspecified: Secondary | ICD-10-CM | POA: Insufficient documentation

## 2019-08-08 DIAGNOSIS — Z21 Asymptomatic human immunodeficiency virus [HIV] infection status: Secondary | ICD-10-CM | POA: Diagnosis not present

## 2019-08-08 DIAGNOSIS — M545 Low back pain, unspecified: Secondary | ICD-10-CM

## 2019-08-08 DIAGNOSIS — Z79899 Other long term (current) drug therapy: Secondary | ICD-10-CM | POA: Diagnosis not present

## 2019-08-08 DIAGNOSIS — I2510 Atherosclerotic heart disease of native coronary artery without angina pectoris: Secondary | ICD-10-CM | POA: Insufficient documentation

## 2019-08-08 DIAGNOSIS — I10 Essential (primary) hypertension: Secondary | ICD-10-CM | POA: Diagnosis not present

## 2019-08-08 DIAGNOSIS — G8929 Other chronic pain: Secondary | ICD-10-CM

## 2019-08-08 MED ORDER — ACETAMINOPHEN 500 MG PO TABS
1000.0000 mg | ORAL_TABLET | Freq: Once | ORAL | Status: AC
  Administered 2019-08-08: 06:00:00 1000 mg via ORAL
  Filled 2019-08-08: qty 2

## 2019-08-08 MED ORDER — METHOCARBAMOL 500 MG PO TABS: 500.0000 mg | ORAL_TABLET | Freq: Two times a day (BID) | ORAL | 0 refills | Status: DC

## 2019-08-08 MED ORDER — METHOCARBAMOL 500 MG PO TABS
1000.0000 mg | ORAL_TABLET | Freq: Once | ORAL | Status: AC
  Administered 2019-08-08: 1000 mg via ORAL
  Filled 2019-08-08: qty 2

## 2019-08-08 MED ORDER — ACETAMINOPHEN 325 MG PO TABS: 650.0000 mg | ORAL_TABLET | Freq: Four times a day (QID) | ORAL | 0 refills | Status: DC | PRN
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
## 2019-08-08 NOTE — ED Notes (Signed)
Discharge instructions discussed with pt. Pt verbalized understanding with no questions at this time.  

## 2019-08-08 NOTE — Telephone Encounter (Signed)
Joshlyn Beadle J. Clydene Laming, RN, BSN, [place] [phone]  [facility] set up appointment with [place] on 12/8 @ 3:10.  Spoke with pt at via telephone and advised to please arrive 15 min early and take a picture ID and your current medications.  Pt verbalizes understanding of keeping appointment.

## 2019-08-08 NOTE — ED Notes (Signed)
Pt advised that urine sample is needed. Pt was given a urinal and said that he could try to give a urine sample.

## 2019-08-08 NOTE — ED Triage Notes (Signed)
Patient reports chronic low back pain worse these past several days , denies injury or fall , no dysuria or hematuria/ambulatory .

## 2019-08-10 LAB — CBC WITH DIFFERENTIAL/PLATELET
Absolute Monocytes: 567 cells/uL (ref 200–950)
Basophils Absolute: 31 cells/uL (ref 0–200)
Basophils Relative: 0.5 %
Eosinophils Absolute: 79 cells/uL (ref 15–500)
Eosinophils Relative: 1.3 %
HCT: 39.6 % (ref 38.5–50.0)
Hemoglobin: 13.1 g/dL — ABNORMAL LOW (ref 13.2–17.1)
Lymphs Abs: 2007 cells/uL (ref 850–3900)
MCH: 27.7 pg (ref 27.0–33.0)
MCHC: 33.1 g/dL (ref 32.0–36.0)
MCV: 83.7 fL (ref 80.0–100.0)
MPV: 12.5 fL (ref 7.5–12.5)
Monocytes Relative: 9.3 %
Neutro Abs: 3416 cells/uL (ref 1500–7800)
Neutrophils Relative %: 56 %
Platelets: 253 10*3/uL (ref 140–400)
RBC: 4.73 10*6/uL (ref 4.20–5.80)
RDW: 13.9 % (ref 11.0–15.0)
Total Lymphocyte: 32.9 %
WBC: 6.1 10*3/uL (ref 3.8–10.8)

## 2019-08-10 LAB — COMPLETE METABOLIC PANEL WITH GFR
AG Ratio: 1.4 (calc) (ref 1.0–2.5)
ALT: 10 U/L (ref 9–46)
AST: 12 U/L (ref 10–35)
Albumin: 3.7 g/dL (ref 3.6–5.1)
Alkaline phosphatase (APISO): 85 U/L (ref 35–144)
BUN: 20 mg/dL (ref 7–25)
CO2: 28 mmol/L (ref 20–32)
Calcium: 9.6 mg/dL (ref 8.6–10.3)
Chloride: 106 mmol/L (ref 98–110)
Creat: 0.96 mg/dL (ref 0.70–1.25)
GFR, Est African American: 96 mL/min/{1.73_m2} (ref >=60)
GFR, Est Non African American: 83 mL/min/{1.73_m2} (ref >=60)
Globulin: 2.6 g/dL (calc) (ref 1.9–3.7)
Glucose, Bld: 86 mg/dL (ref 65–99)
Potassium: 4.4 mmol/L (ref 3.5–5.3)
Sodium: 140 mmol/L (ref 135–146)
Total Bilirubin: 0.3 mg/dL (ref 0.2–1.2)
Total Protein: 6.3 g/dL (ref 6.1–8.1)

## 2019-08-10 LAB — HIV RNA, RTPCR W/R GT (RTI, PI,INT)
HIV 1 RNA Quant: <20 copies/mL
HIV-1 RNA Quant, Log: <1.3 Log copies/mL

## 2019-08-10 LAB — RPR: RPR Ser Ql: NONREACTIVE

## 2019-08-11 ENCOUNTER — Telehealth: Payer: Self-pay | Admitting: *Deleted

## 2019-08-11 ENCOUNTER — Other Ambulatory Visit: Payer: Self-pay

## 2019-08-11 ENCOUNTER — Encounter (HOSPITAL_COMMUNITY): Payer: Self-pay

## 2019-08-11 ENCOUNTER — Emergency Department (HOSPITAL_COMMUNITY)
Admission: EM | Admit: 2019-08-11 | Discharge: 2019-08-11 | Disposition: A | Discharge status: Home / Self Care | Payer: Medicare Other | Attending: Emergency Medicine | Admitting: Emergency Medicine

## 2019-08-11 DIAGNOSIS — Z79899 Other long term (current) drug therapy: Secondary | ICD-10-CM | POA: Insufficient documentation

## 2019-08-11 DIAGNOSIS — I110 Hypertensive heart disease with heart failure: Secondary | ICD-10-CM | POA: Insufficient documentation

## 2019-08-11 DIAGNOSIS — J449 Chronic obstructive pulmonary disease, unspecified: Secondary | ICD-10-CM | POA: Insufficient documentation

## 2019-08-11 DIAGNOSIS — G8929 Other chronic pain: Secondary | ICD-10-CM | POA: Diagnosis not present

## 2019-08-11 DIAGNOSIS — M545 Low back pain, unspecified: Secondary | ICD-10-CM

## 2019-08-11 DIAGNOSIS — I509 Heart failure, unspecified: Secondary | ICD-10-CM | POA: Insufficient documentation

## 2019-08-11 DIAGNOSIS — J45909 Unspecified asthma, uncomplicated: Secondary | ICD-10-CM | POA: Insufficient documentation

## 2019-08-11 DIAGNOSIS — F17210 Nicotine dependence, cigarettes, uncomplicated: Secondary | ICD-10-CM | POA: Diagnosis not present

## 2019-08-11 DIAGNOSIS — I259 Chronic ischemic heart disease, unspecified: Secondary | ICD-10-CM | POA: Insufficient documentation

## 2019-08-11 MED ORDER — OXYCODONE-ACETAMINOPHEN 5-325 MG PO TABS
1.0000 | ORAL_TABLET | Freq: Once | ORAL | Status: AC
  Administered 2019-08-11: 1 via ORAL
  Filled 2019-08-11: qty 1

## 2019-08-11 MED ORDER — OXYCODONE HCL 5 MG PO TABS: 2.5000 mg | ORAL_TABLET | Freq: Four times a day (QID) | ORAL | 0 refills | Status: DC | PRN

## 2019-08-11 MED ORDER — BACLOFEN 10 MG PO TABS: 10.0000 mg | ORAL_TABLET | Freq: Three times a day (TID) | ORAL | 0 refills | Status: AC

## 2019-08-11 NOTE — Discharge Instructions (Addendum)
SEEK IMMEDIATE MEDICAL ATTENTION IF: New numbness, tingling, weakness, or problem with the use of your arms or legs.  Severe back pain not relieved with medications.  Change in bowel or bladder control.  Increasing pain in any areas of the body (such as chest or abdominal pain).  Shortness of breath, dizziness or fainting.  Nausea (feeling sick to your stomach), vomiting, fever, or sweats.  

## 2019-08-11 NOTE — Telephone Encounter (Signed)
Call received from [place], she would like to see if I can help Kenneth [name] with pain management. He has about 9 ED visits due to unrelieved pain.   Call placed to Kenneth [name]. After a coulple of attempts and inability to leave a message (VM full) I sent Patience a message asking her to please share my number with the patient for him to reach me when he is free.
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
## 2019-08-11 NOTE — ED Triage Notes (Signed)
Patient c/o chronic back pain. Patient states he has an appointment with an orthopedic surgeon 08/20/19. Patient states he is needing something for pain. Patient states he ran out of pain meds (oxycodone).

## 2019-08-12 ENCOUNTER — Telehealth: Payer: Self-pay | Admitting: *Deleted

## 2019-08-12 ENCOUNTER — Encounter: Payer: Self-pay | Admitting: Internal Medicine

## 2019-08-12 ENCOUNTER — Ambulatory Visit (INDEPENDENT_AMBULATORY_CARE_PROVIDER_SITE_OTHER): Payer: Medicare Other | Admitting: Internal Medicine

## 2019-08-12 ENCOUNTER — Ambulatory Visit (INDEPENDENT_AMBULATORY_CARE_PROVIDER_SITE_OTHER): Payer: Medicare Other | Admitting: Pharmacist

## 2019-08-12 DIAGNOSIS — Z23 Encounter for immunization: Secondary | ICD-10-CM

## 2019-08-12 DIAGNOSIS — F17210 Nicotine dependence, cigarettes, uncomplicated: Secondary | ICD-10-CM | POA: Diagnosis not present

## 2019-08-12 DIAGNOSIS — B20 Human immunodeficiency virus [HIV] disease: Secondary | ICD-10-CM

## 2019-08-12 DIAGNOSIS — Z21 Asymptomatic human immunodeficiency virus [HIV] infection status: Secondary | ICD-10-CM

## 2019-08-12 NOTE — Assessment & Plan Note (Signed)
I encouraged him to go through with his plan to set a firm quit date and stop smoking.
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
## 2019-08-12 NOTE — Assessment & Plan Note (Signed)
His infection remains under excellent, long-term control.  He received his influenza vaccine today.  He will continue Genvoya and follow-up after lab work in 6 months.

## 2019-08-12 NOTE — Telephone Encounter (Signed)
Return call received by Mr. Edley who stated he would be interested in receiving help with getting back into care with [facility]. Explained to Mr. Badley my role with [facility] and that I would love to help him with staying in care with Dr Megan Salon and help with filling "gaps" that may come along with way. Informed Mr. Character that he has a scheduled appt with Dr Megan Salon and Pharmacy for today at 1:45. Mr Deni was not aware and thought his appt was on the 1st but stated he can be present for his appt today at 1:45.

## 2019-08-12 NOTE — Progress Notes (Signed)
Patient currently doing well on Genvoya and had no questions for me today.  Gave him my card and explained the pharmacy services here at the clinic.  He will call me with any issues/questions/concerns in the future.

## 2019-08-23 ENCOUNTER — Emergency Department (HOSPITAL_COMMUNITY)
Admission: EM | Admit: 2019-08-23 | Discharge: 2019-08-23 | Disposition: A | Discharge status: Home / Self Care | Payer: Medicare Other | Attending: Emergency Medicine | Admitting: Emergency Medicine

## 2019-08-23 ENCOUNTER — Other Ambulatory Visit: Payer: Self-pay

## 2019-08-23 ENCOUNTER — Encounter (HOSPITAL_COMMUNITY): Payer: Self-pay | Admitting: Emergency Medicine

## 2019-08-23 DIAGNOSIS — B20 Human immunodeficiency virus [HIV] disease: Secondary | ICD-10-CM | POA: Insufficient documentation

## 2019-08-23 DIAGNOSIS — I2510 Atherosclerotic heart disease of native coronary artery without angina pectoris: Secondary | ICD-10-CM | POA: Diagnosis not present

## 2019-08-23 DIAGNOSIS — J449 Chronic obstructive pulmonary disease, unspecified: Secondary | ICD-10-CM | POA: Diagnosis not present

## 2019-08-23 DIAGNOSIS — L723 Sebaceous cyst: Secondary | ICD-10-CM | POA: Diagnosis not present

## 2019-08-23 DIAGNOSIS — I110 Hypertensive heart disease with heart failure: Secondary | ICD-10-CM | POA: Insufficient documentation

## 2019-08-23 DIAGNOSIS — I10 Essential (primary) hypertension: Secondary | ICD-10-CM | POA: Diagnosis not present

## 2019-08-23 DIAGNOSIS — Z79899 Other long term (current) drug therapy: Secondary | ICD-10-CM | POA: Insufficient documentation

## 2019-08-23 DIAGNOSIS — Z720 Tobacco use: Secondary | ICD-10-CM | POA: Diagnosis not present

## 2019-08-23 DIAGNOSIS — L089 Local infection of the skin and subcutaneous tissue, unspecified: Secondary | ICD-10-CM

## 2019-08-23 DIAGNOSIS — I509 Heart failure, unspecified: Secondary | ICD-10-CM | POA: Diagnosis not present

## 2019-08-23 DIAGNOSIS — L02811 Cutaneous abscess of head [any part, except face]: Secondary | ICD-10-CM | POA: Diagnosis not present

## 2019-08-23 DIAGNOSIS — L0291 Cutaneous abscess, unspecified: Secondary | ICD-10-CM

## 2019-08-23 HISTORY — DX: Dorsalgia, unspecified: M54.9

## 2019-08-23 MED ORDER — DOXYCYCLINE HYCLATE 100 MG PO CAPS: 100.0000 mg | ORAL_CAPSULE | Freq: Two times a day (BID) | ORAL | 0 refills | Status: DC

## 2019-08-23 MED ORDER — LIDOCAINE 5 % EX PTCH
1.0000 | MEDICATED_PATCH | CUTANEOUS | Status: DC
  Administered 2019-08-23: 1 via TRANSDERMAL
  Filled 2019-08-23: qty 1

## 2019-08-23 MED ORDER — LIDOCAINE HCL (PF) 1 % IJ SOLN
5.0000 mL | Freq: Once | INTRAMUSCULAR | Status: AC
  Administered 2019-08-23: 5 mL
  Filled 2019-08-23: qty 5

## 2019-08-23 NOTE — ED Notes (Signed)
Pt refused exit VS

## 2019-08-23 NOTE — ED Triage Notes (Signed)
C/o chronic lower back pain that has been flared up x 14 days.  States he is having back surgery in January.  Also c/o abscess x 2 to L side of head x 8-9 months.

## 2019-08-23 NOTE — ED Notes (Signed)
Discharge instructions and prescription discussed with Pt. Pt verbalized understanding. Pt stable and ambulatory.    

## 2019-08-23 NOTE — Discharge Instructions (Addendum)
Take antibiotics as prescribed and complete the full course.  Apply warm compresses to area for 20 minutes three times daily. Follow-up with general surgery, call on Monday to schedule appointment. Remove your packing on Monday or go to urgent care for packing removal if unable to be seen by general surgery on Monday.
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
## 2019-08-24 ENCOUNTER — Emergency Department (HOSPITAL_COMMUNITY)
Admission: EM | Admit: 2019-08-24 | Discharge: 2019-08-24 | Disposition: A | Discharge status: Home / Self Care | Payer: Medicare Other | Attending: Emergency Medicine | Admitting: Emergency Medicine

## 2019-08-24 ENCOUNTER — Encounter (HOSPITAL_COMMUNITY): Payer: Self-pay

## 2019-08-24 ENCOUNTER — Other Ambulatory Visit: Payer: Self-pay

## 2019-08-24 DIAGNOSIS — M545 Low back pain, unspecified: Secondary | ICD-10-CM

## 2019-08-24 DIAGNOSIS — J449 Chronic obstructive pulmonary disease, unspecified: Secondary | ICD-10-CM | POA: Diagnosis not present

## 2019-08-24 DIAGNOSIS — Z79899 Other long term (current) drug therapy: Secondary | ICD-10-CM | POA: Diagnosis not present

## 2019-08-24 DIAGNOSIS — I2510 Atherosclerotic heart disease of native coronary artery without angina pectoris: Secondary | ICD-10-CM | POA: Diagnosis not present

## 2019-08-24 DIAGNOSIS — G8929 Other chronic pain: Secondary | ICD-10-CM

## 2019-08-24 DIAGNOSIS — Z21 Asymptomatic human immunodeficiency virus [HIV] infection status: Secondary | ICD-10-CM | POA: Insufficient documentation

## 2019-08-24 DIAGNOSIS — F17210 Nicotine dependence, cigarettes, uncomplicated: Secondary | ICD-10-CM | POA: Insufficient documentation

## 2019-08-24 DIAGNOSIS — I110 Hypertensive heart disease with heart failure: Secondary | ICD-10-CM | POA: Diagnosis not present

## 2019-08-24 DIAGNOSIS — I509 Heart failure, unspecified: Secondary | ICD-10-CM | POA: Diagnosis not present

## 2019-08-24 MED ORDER — GABAPENTIN 100 MG PO CAPS: 100.0000 mg | ORAL_CAPSULE | Freq: Three times a day (TID) | ORAL | 0 refills | Status: AC

## 2019-08-24 MED ORDER — METHOCARBAMOL 500 MG PO TABS: 500.0000 mg | ORAL_TABLET | Freq: Two times a day (BID) | ORAL | 0 refills | Status: DC | PRN

## 2019-08-24 MED ORDER — METHOCARBAMOL 500 MG PO TABS
500.0000 mg | ORAL_TABLET | Freq: Once | ORAL | Status: AC
  Administered 2019-08-24: 500 mg via ORAL
  Filled 2019-08-24: qty 1

## 2019-08-24 MED ORDER — DEXAMETHASONE SODIUM PHOSPHATE 10 MG/ML IJ SOLN
8.0000 mg | Freq: Once | INTRAMUSCULAR | Status: AC
  Administered 2019-08-24: 8 mg via INTRAMUSCULAR
  Filled 2019-08-24: qty 1
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
## 2019-08-24 NOTE — ED Triage Notes (Signed)
Pt with c/o chronic lower back pain, pt stated he is going to have surgery in January. Pt states that he has taken Tylenol at home with no relief. Pt is ambulatory, however, it is painful to walk and he cannot get comfortable.   Pt denies, COVID contact, fever, cough, SHOB or CP.

## 2019-08-24 NOTE — ED Notes (Signed)
Pt verbalizes understanding of DC instructions. Pt belongings returned and assisted out of ED in wheelchair

## 2019-08-24 NOTE — Discharge Instructions (Addendum)
Follow up closely with your back specialist to discuss medication management of your chronic back pain.

## 2019-08-26 ENCOUNTER — Other Ambulatory Visit: Payer: Self-pay

## 2019-08-26 ENCOUNTER — Encounter (INDEPENDENT_AMBULATORY_CARE_PROVIDER_SITE_OTHER): Payer: Self-pay | Admitting: Primary Care

## 2019-08-26 ENCOUNTER — Ambulatory Visit (INDEPENDENT_AMBULATORY_CARE_PROVIDER_SITE_OTHER): Payer: Medicare Other | Admitting: Primary Care

## 2019-08-26 DIAGNOSIS — Z09 Encounter for follow-up examination after completed treatment for conditions other than malignant neoplasm: Secondary | ICD-10-CM

## 2019-08-26 DIAGNOSIS — Z7689 Persons encountering health services in other specified circumstances: Secondary | ICD-10-CM

## 2019-08-26 DIAGNOSIS — F17210 Nicotine dependence, cigarettes, uncomplicated: Secondary | ICD-10-CM | POA: Diagnosis not present

## 2019-08-26 DIAGNOSIS — B20 Human immunodeficiency virus [HIV] disease: Secondary | ICD-10-CM | POA: Diagnosis not present

## 2019-08-26 NOTE — Progress Notes (Signed)
Virtual Visit via Telephone Note  I connected with Kenneth [name] on 08/26/19 at  3:10 PM EST by telephone and verified that I am speaking with the correct person using two identifiers.   I discussed the limitations, risks, security and privacy concerns of performing an evaluation and management service by telephone and the availability of in person appointments. I also discussed with the patient that there may be a patient responsible charge related to this service. The patient expressed understanding and agreed to proceed.   History of Present Illness: Kenneth [name] is etablishing care and recently been seen in the emergency room on two different occassions 08/23/2019 abscess to the left side of his head x2. 08/24/2019 Chronic bilateral low back pain, unspecified whether sciatica present. He is complaining about low back pain . No other complaints or concerns. Past Medical History:  Diagnosis Date  . Arthritis    "both shoulders" (09/24/2012)  . Back pain   . Chest pain at rest 09/23/2012  . Chronic bronchitis ([place])    "q year last 5 yr or so" (09/24/2012)  . Chronic lower back pain   . Coronary artery disease   . Exertional dyspnea   . HIV positive ([place]) 2004  . Hypertension   . 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 Lumbar disc disease   . Meningitis   . Migraines   . Pneumonia    Current Outpatient Medications on File Prior to Visit  Medication Sig Dispense Refill  . acetaminophen (TYLENOL) 325 MG tablet Take 2 tablets (650 mg total) by mouth every 6 (six) hours as needed. (Patient taking differently: Take 650 mg by mouth every 6 (six) hours as needed for mild pain. ) 30 tablet 0  . amLODipine (NORVASC) 5 MG tablet Take 5 mg by mouth daily.    Marland Kitchen atorvastatin (LIPITOR) 40 MG tablet Take 40 mg by mouth at bedtime.    . baclofen (LIORESAL) 10 MG tablet Take 1 tablet (10 mg total) by mouth 3 (three) times daily. (Patient taking differently: Take 10 mg by mouth 3 (three) times daily as  needed for muscle spasms. ) 30 each 0  . diclofenac Sodium (VOLTAREN) 1 % GEL Apply 2 g topically 4 (four) times daily. (Patient taking differently: Apply 2 g topically 4 (four) times daily as needed (pain). ) 350 g 0  . doxycycline (VIBRAMYCIN) 100 MG capsule Take 1 capsule (100 mg total) by mouth 2 (two) times daily. 20 capsule 0  . elvitegravir-cobicistat-emtricitabine-tenofovir (GENVOYA) 150-150-200-10 MG TABS tablet Take 1 tablet by mouth every morning.     . gabapentin (NEURONTIN) 100 MG capsule Take 1 capsule (100 mg total) by mouth 3 (three) times daily. 21 capsule 0  . Menthol, Topical Analgesic, (ICY HOT EX) Apply 1 application topically as needed (muscle pain).    . methocarbamol (ROBAXIN) 500 MG tablet Take 1 tablet (500 mg total) by mouth 2 (two) times daily as needed for muscle spasms. 30 tablet 0  . VENTOLIN HFA 108 (90 Base) MCG/ACT inhaler Inhale 2 puffs every 6 (six) hours as needed into the lungs for wheezing or shortness of breath.   0  . EPINEPHrine (EPI-PEN) 0.3 mg/0.3 mL SOAJ Inject 0.3 mg into the muscle once as needed for anaphylaxis.     Marland Kitchen nitroGLYCERIN (NITROSTAT) 0.4 MG SL tablet Place 0.4 mg under the tongue every 5 (five) minutes as needed for chest pain.      No current facility-administered medications on file prior to  visit.    Observations/Objective: Review of Systems  Musculoskeletal: Positive for back pain.  Skin: Positive for rash.       face  All other systems reviewed and are negative.   Assessment and Plan: Kenneth [name] was seen today for follow-up.  Diagnoses and all orders for this visit:  Establishing care with new doctor, encounter for Juluis Mire, NP-C will be your  (PCP) mastered prepared that is able to that will  diagnosed and treatment able to answer health concern as well as continuing care of varied medical conditions, not limited by cause, organ system, or diagnosis.   Cigarette smoker He is aware increased risk for lung cancer and  other respiratory diseases recommend cessation.  This will be reminded at each clinical visit   HIV infection, unspecified symptom status ([place]) Last visit was March 2015. He separated from his wife and moved to [place], [place].  Managed  [facility]. Re establish care with Dr. Megan Salon and will be managing his [facility] discharge follow-up Frequent emergency room visit now that he has establish care with PCP he can be followed by tele, web or in person for non urgent care.   Follow Up Instructions:    I discussed the assessment and treatment plan with the patient. The patient was provided an opportunity to ask questions and all were answered. The patient agreed with the plan and demonstrated an understanding of the instructions.   The patient was advised to call back or seek an in-person evaluation if the symptoms worsen or if the condition fails to improve as anticipated.  I provided 28 minutes of non-face-to-face time during this encounter. To include notes from care every where [facility] Clock) , encounters, labs and imaging   Kerin Perna, NP

## 2019-09-08 ENCOUNTER — Other Ambulatory Visit (INDEPENDENT_AMBULATORY_CARE_PROVIDER_SITE_OTHER): Payer: Self-pay | Admitting: Primary Care

## 2019-09-08 DIAGNOSIS — Z008 Encounter for other general examination: Secondary | ICD-10-CM

## 2019-09-08 DIAGNOSIS — Z21 Asymptomatic human immunodeficiency virus [HIV] infection status: Secondary | ICD-10-CM

## 2019-10-03 IMAGING — CR DG LUMBAR SPINE COMPLETE 4+V
5 series · 5 of 5 positions shown · non-contrast
Comparison: Radiographs dated 03/22/2015 and CT scan of the chest
dated 02/01/2014 and CT scan of the abdomen dated 02/01/2014

CLINICAL DATA: Low back pain secondary to a fall this morning.

EXAM:
LUMBAR SPINE - COMPLETE 4+ VIEW

[t lumbar spine ap]
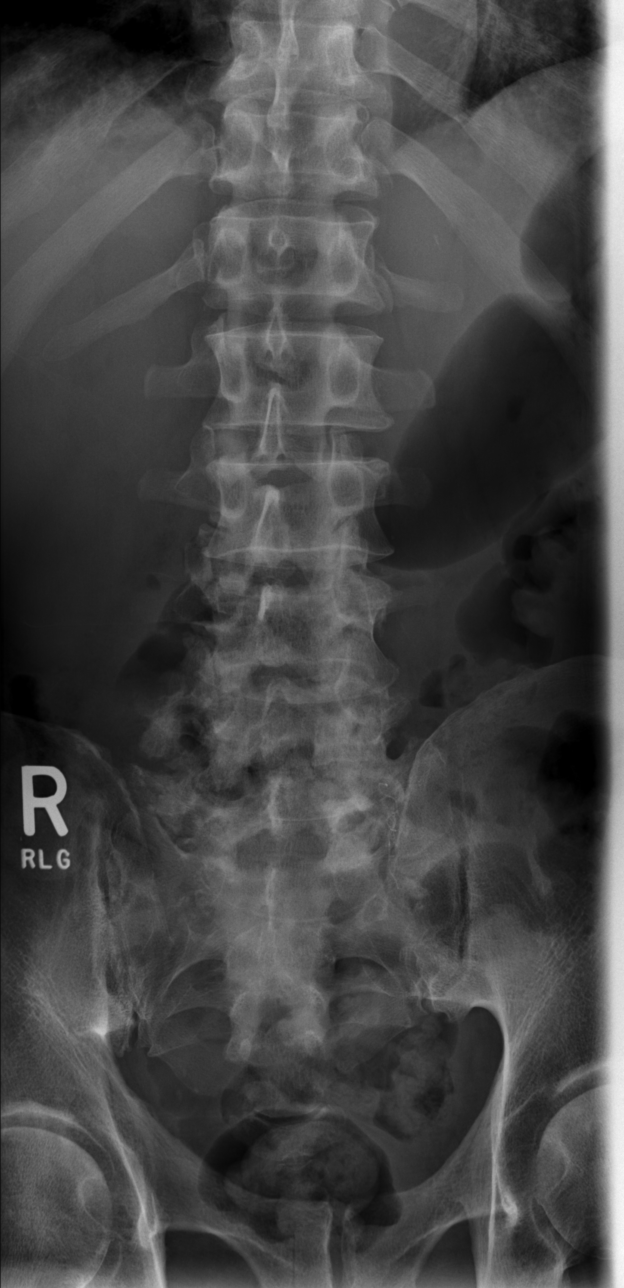

[t lumbar spine obl (1 of 2)]
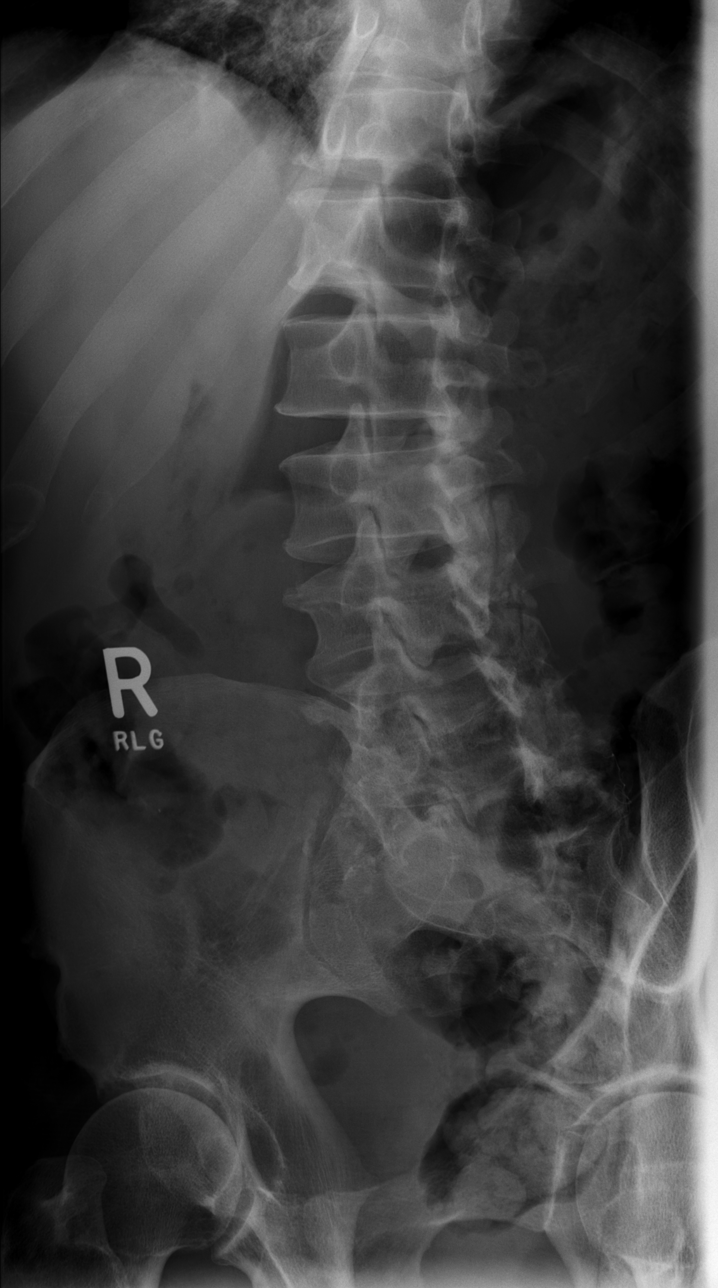

[t lumbar spine obl (2 of 2)]
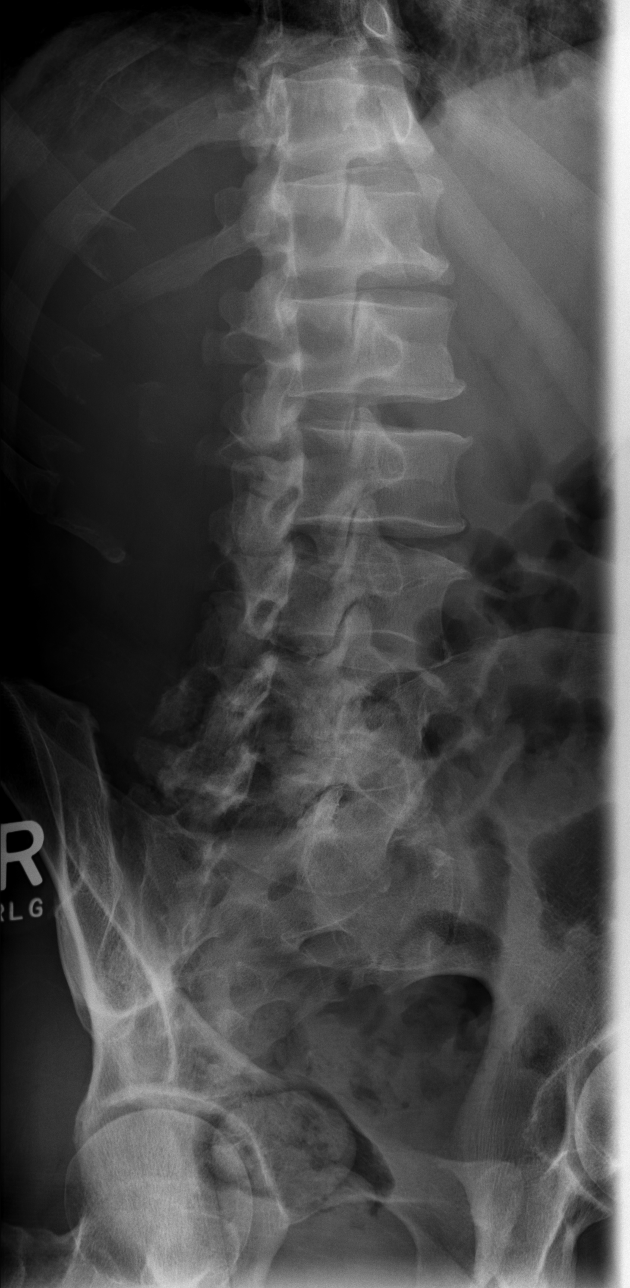

[t lumbar spine lat]
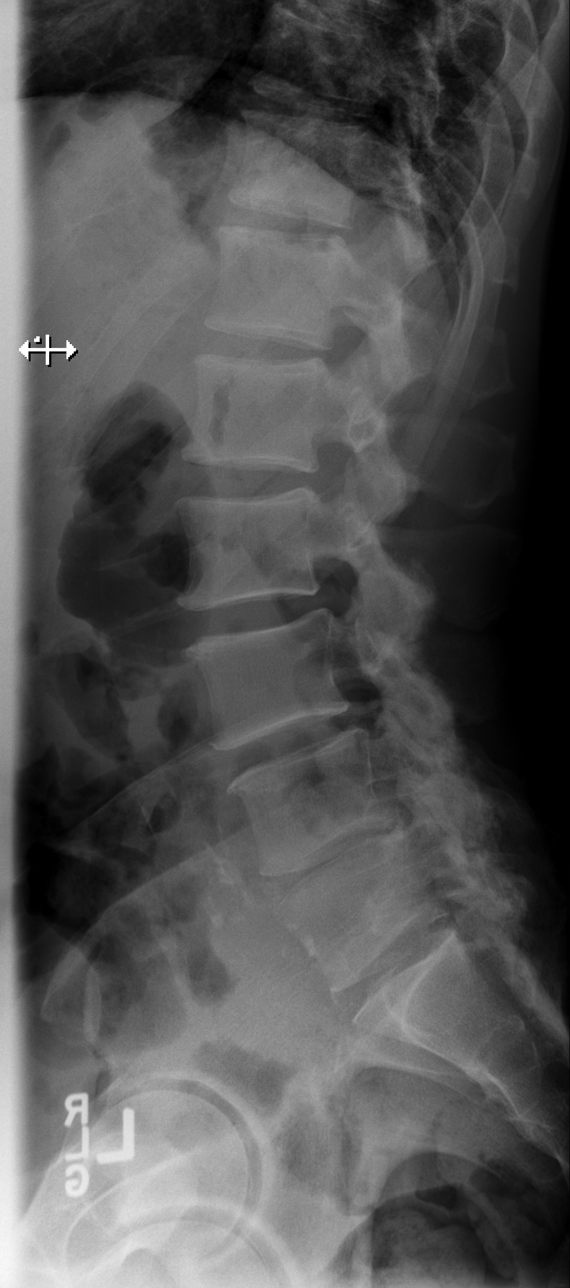

[t lumbar l-5 s-1 spot]
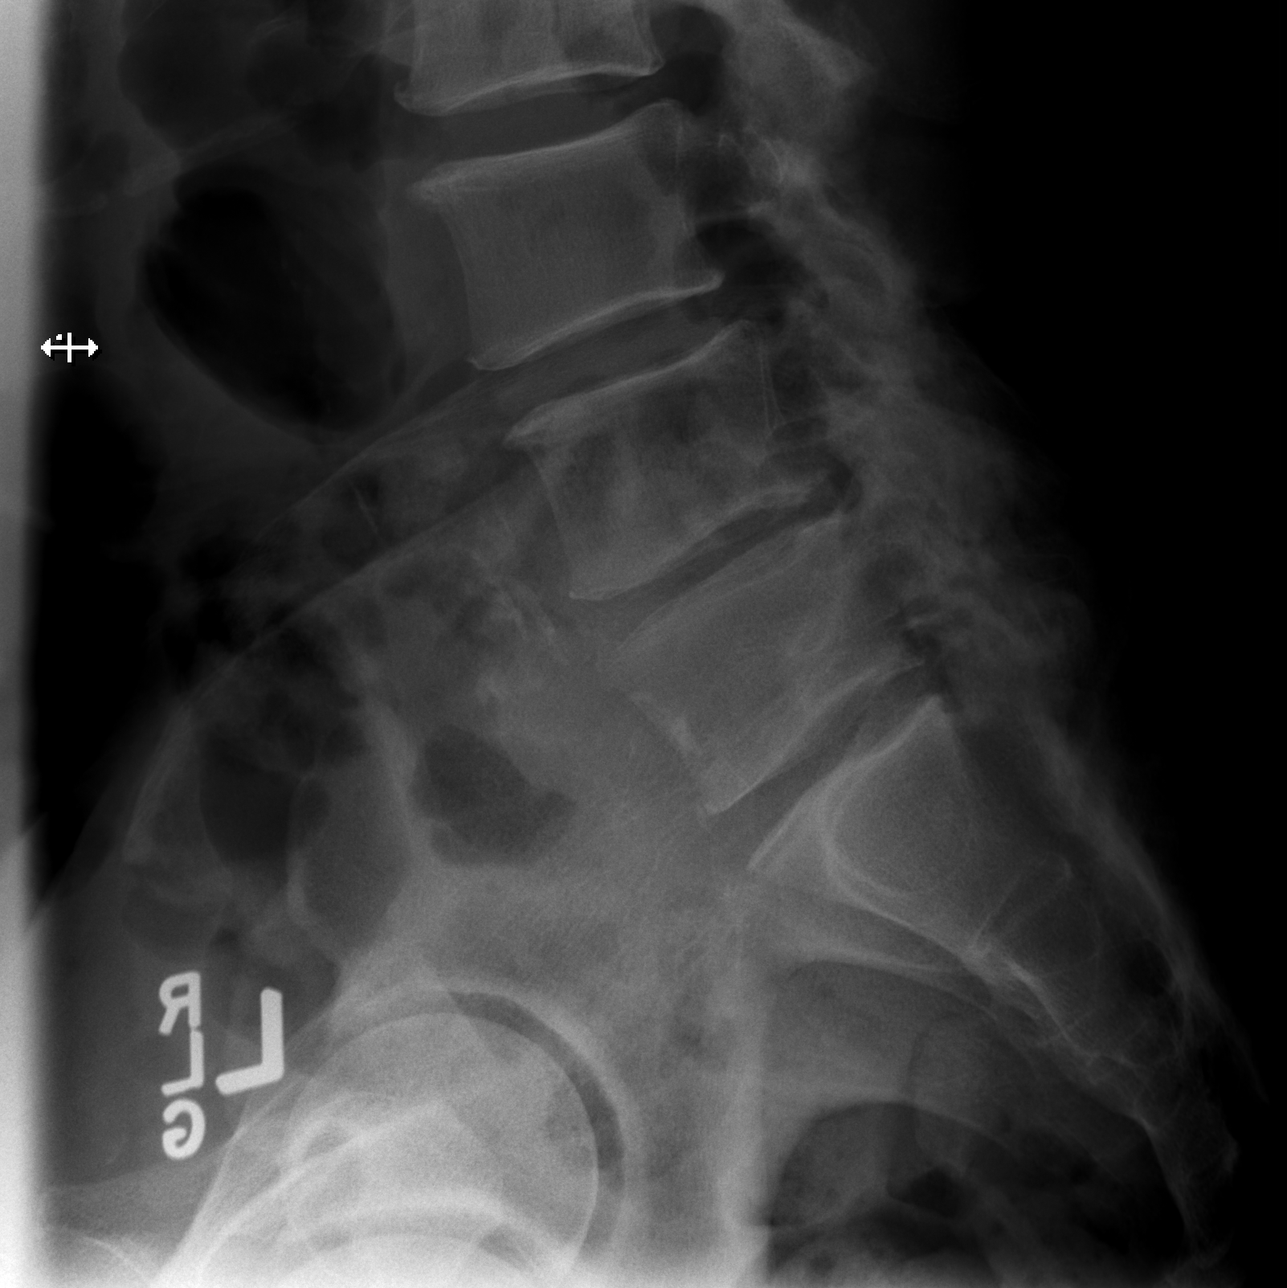

[5 of 5 positions shown; findings below may reference images not displayed]

FINDINGS: There are small ribs at L1. There is lumbarization of the S1
segment. Patient has moderately severe facet arthritis at L3-4,
L4-5, L5-S1, and S1-2. Move subtle anterolisthesis at L3-4 and L4-5.
There is chronic disc space narrowing at L5-S1.

No fracture or significant change since the prior radiographs.

Note is made of aortic atherosclerosis.
IMPRESSION: No acute abnormality. Multilevel degenerative facet arthritis in the
lumbar spine. Degenerative disc disease at L5-S1, chronic.
Congenital segmentation anomalies.

## 2019-12-16 ENCOUNTER — Telehealth (INDEPENDENT_AMBULATORY_CARE_PROVIDER_SITE_OTHER): Payer: Self-pay

## 2019-12-16 NOTE — Telephone Encounter (Signed)
Unable  to provide medical clearance last seen patient 08/2019. Office needs to send request for medical clearance also after information receive will consult with infectious disease for any contraindications.

## 2019-12-16 NOTE — Telephone Encounter (Signed)
Patient wife called requesting imaging for medical clearance for her husband to have back surgery. Patient states the [facility] with Dr. Mallie Mussel will be doing the surgery.   Please advice [phone] or [phone]

## 2019-12-16 NOTE — Telephone Encounter (Signed)
We have not received any medical clearance forms. Please advise.

## 2019-12-17 NOTE — Telephone Encounter (Signed)
Patient aware to contact surgeons office and ask them to fax medical clearance forms to [facility]. Once received we will call to schedule an appointment.

## 2019-12-29 ENCOUNTER — Encounter (INDEPENDENT_AMBULATORY_CARE_PROVIDER_SITE_OTHER): Payer: Self-pay | Admitting: Primary Care

## 2019-12-29 ENCOUNTER — Other Ambulatory Visit: Payer: Self-pay

## 2019-12-29 ENCOUNTER — Ambulatory Visit (INDEPENDENT_AMBULATORY_CARE_PROVIDER_SITE_OTHER): Payer: 59 | Admitting: Primary Care

## 2019-12-29 VITALS — BP 153/106 | HR 94 | Temp 97.3°F | Ht 68.0 in | Wt 133.4 lb

## 2019-12-29 DIAGNOSIS — Z1211 Encounter for screening for malignant neoplasm of colon: Secondary | ICD-10-CM

## 2019-12-29 DIAGNOSIS — G894 Chronic pain syndrome: Secondary | ICD-10-CM | POA: Diagnosis not present

## 2019-12-29 DIAGNOSIS — I10 Essential (primary) hypertension: Secondary | ICD-10-CM

## 2019-12-29 DIAGNOSIS — Z01818 Encounter for other preprocedural examination: Secondary | ICD-10-CM | POA: Diagnosis not present

## 2019-12-29 MED ORDER — AMLODIPINE BESYLATE 10 MG PO TABS: 10.0000 mg | ORAL_TABLET | Freq: Every day | ORAL | 3 refills | Status: AC

## 2019-12-29 MED ORDER — CHLORTHALIDONE 50 MG PO TABS: 50.0000 mg | ORAL_TABLET | Freq: Every day | ORAL | 1 refills | Status: DC
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
## 2019-12-29 NOTE — Patient Instructions (Signed)

## 2020-01-12 ENCOUNTER — Encounter (INDEPENDENT_AMBULATORY_CARE_PROVIDER_SITE_OTHER): Payer: Self-pay | Admitting: Primary Care

## 2020-01-12 ENCOUNTER — Other Ambulatory Visit: Payer: Self-pay

## 2020-01-12 ENCOUNTER — Ambulatory Visit (INDEPENDENT_AMBULATORY_CARE_PROVIDER_SITE_OTHER): Payer: 59 | Admitting: Primary Care

## 2020-01-12 VITALS — BP 139/93 | HR 93 | Temp 97.2°F | Ht 68.0 in | Wt 137.0 lb

## 2020-01-12 DIAGNOSIS — I10 Essential (primary) hypertension: Secondary | ICD-10-CM

## 2020-01-12 DIAGNOSIS — M5010 Cervical disc disorder with radiculopathy, unspecified cervical region: Secondary | ICD-10-CM

## 2020-01-12 MED ORDER — METOPROLOL TARTRATE 25 MG PO TABS: 12.5000 mg | ORAL_TABLET | Freq: Two times a day (BID) | ORAL | 0 refills | Status: DC

## 2020-01-12 MED ORDER — METHOCARBAMOL 500 MG PO TABS: 500.0000 mg | ORAL_TABLET | Freq: Four times a day (QID) | ORAL | 1 refills | Status: AC

## 2020-01-12 NOTE — Progress Notes (Signed)
Pain in left hip  Pain in lower back and neck
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
## 2020-01-12 NOTE — Patient Instructions (Signed)

## 2020-01-26 ENCOUNTER — Other Ambulatory Visit: Payer: Medicare Other

## 2020-02-09 ENCOUNTER — Encounter: Payer: Medicare Other | Admitting: Internal Medicine

## 2020-02-09 ENCOUNTER — Ambulatory Visit (INDEPENDENT_AMBULATORY_CARE_PROVIDER_SITE_OTHER): Payer: 59 | Admitting: Primary Care

## 2020-02-09 ENCOUNTER — Encounter (INDEPENDENT_AMBULATORY_CARE_PROVIDER_SITE_OTHER): Payer: Self-pay | Admitting: Primary Care

## 2020-02-09 ENCOUNTER — Other Ambulatory Visit: Payer: Self-pay

## 2020-02-09 VITALS — BP 136/96 | HR 96 | Temp 97.3°F | Ht 68.0 in | Wt 135.8 lb

## 2020-02-09 DIAGNOSIS — I10 Essential (primary) hypertension: Secondary | ICD-10-CM | POA: Diagnosis not present

## 2020-02-09 DIAGNOSIS — G894 Chronic pain syndrome: Secondary | ICD-10-CM

## 2020-02-09 NOTE — Patient Instructions (Signed)

## 2020-02-09 NOTE — Progress Notes (Signed)
Acute Office Visit  Subjective:    Patient ID: Kenneth [name], male    DOB: 1956-06-13, 64 y.o.   MRN: [id number]  Chief Complaint  Patient presents with  . Blood Pressure Check    HPI Mr. Krishav Lassila is in today for blood pressure follow up medication adjusted on last visit increase amlodipine from 5mg  to 10mg .Blood pressure is improving. Denies shortness of breath, headaches, chest pain or lower extremity edema, sudden onset, vision changes, unilateral weakness, dizziness, paresthesias. Awaiting surgery for cervical/spinal. Patient to call ortho for f/u on a attended surgical schedule.   Past Medical History:  Diagnosis Date  . Arthritis    "both shoulders" (09/24/2012)  . Back pain   . Chest pain at rest 09/23/2012  . Chronic bronchitis ([place])    "q year last 5 yr or so" (09/24/2012)  . Chronic lower back pain   . Coronary artery disease   . Exertional dyspnea   . HIV positive ([place]) 2004  . Hypertension   . 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 Lumbar disc disease   . Meningitis   . Migraines   . Pneumonia     Past Surgical History:  Procedure Laterality Date  . ABDOMINAL SURGERY    . APPENDECTOMY  2013  . BACK SURGERY  2006   4 BACK SURGERIES  . ELBOW SURGERY  ~ 1997   "removed some stones; right" (09/24/2012)  . INTUSSUSCEPTION REPAIR  10/2011  . POSTERIOR LUMBAR FUSION  1995  . SPINE SURGERY     Injury to back 1995    Family History  Problem Relation Age of Onset  . Heart disease Father   . Heart disease Brother   . Diabetes Brother     Social History   Socioeconomic History  . Marital status: Married    Spouse name: Not on file  . Number of children: Not on file  . Years of education: Not on file  . Highest education level: Not on file  Occupational History  . Not on file  Tobacco Use  . Smoking status: Former Smoker    Packs/day: 0.50    Years: 45.00    Pack years: 22.50    Types: Cigarettes    Quit date: 09/24/2012    Years since quitting: 7.3   . Smokeless tobacco: Never Used  . Tobacco comment: 09/24/2012 "been stopped now ~ 8 month".  3 per day now.  Substance and Sexual Activity  . Alcohol use: No  . Drug use: Not Currently    Types: Cocaine    Comment: former   . Sexual activity: Yes    Partners: Female    Comment: offered condoms 07/2019  Other Topics Concern  . Not on file  Social History Narrative  . Not on file   Social Determinants of Health   Financial Resource Strain:   . Difficulty of Paying Living Expenses:   Food Insecurity:   . Worried About Charity fundraiser in the Last Year:   . Arboriculturist in the Last Year:   Transportation Needs:   . Film/video editor (Medical):   Marland Kitchen Lack of Transportation (Non-Medical):   Physical Activity:   . Days of Exercise per Week:   . Minutes of Exercise per Session:   Stress:   . Feeling of Stress :   Social Connections:   . Frequency of Communication with Friends and Family:   . Frequency of Social Gatherings with Friends  and Family:   . Attends Religious Services:   . Active Member of Clubs or Organizations:   . Attends Archivist Meetings:   Marland Kitchen Marital Status:   Intimate Partner Violence:   . Fear of Current or Ex-Partner:   . Emotionally Abused:   Marland Kitchen Physically Abused:   . Sexually Abused:     Outpatient Medications Prior to Visit  Medication Sig Dispense Refill  . acetaminophen (TYLENOL) 325 MG tablet Take 2 tablets (650 mg total) by mouth every 6 (six) hours as needed. (Patient taking differently: Take 650 mg by mouth every 6 (six) hours as needed for mild pain. ) 30 tablet 0  . amLODipine (NORVASC) 10 MG tablet Take 1 tablet (10 mg total) by mouth daily. 90 tablet 3  . chlorthalidone (HYGROTON) 50 MG tablet Take 1 tablet (50 mg total) by mouth daily. 90 tablet 1  . elvitegravir-cobicistat-emtricitabine-tenofovir (GENVOYA) 150-150-200-10 MG TABS tablet Take 1 tablet by mouth every morning.     Marland Kitchen EPINEPHrine (EPI-PEN) 0.3 mg/0.3 mL SOAJ  Inject 0.3 mg into the muscle once as needed for anaphylaxis.     Marland Kitchen gabapentin (NEURONTIN) 100 MG capsule Take 1 capsule (100 mg total) by mouth 3 (three) times daily. 21 capsule 0  . methocarbamol (ROBAXIN) 500 MG tablet Take 1 tablet (500 mg total) by mouth 4 (four) times daily. 90 tablet 1  . metoprolol tartrate (LOPRESSOR) 25 MG tablet Take 0.5 tablets (12.5 mg total) by mouth 2 (two) times daily. 180 tablet 0  . nitroGLYCERIN (NITROSTAT) 0.4 MG SL tablet Place 0.4 mg under the tongue every 5 (five) minutes as needed for chest pain.     . VENTOLIN HFA 108 (90 Base) MCG/ACT inhaler Inhale 2 puffs every 6 (six) hours as needed into the lungs for wheezing or shortness of breath.   0  . atorvastatin (LIPITOR) 40 MG tablet Take 40 mg by mouth at bedtime.    . baclofen (LIORESAL) 10 MG tablet Take 1 tablet (10 mg total) by mouth 3 (three) times daily. (Patient not taking: Reported on 02/09/2020) 30 each 0  . diclofenac Sodium (VOLTAREN) 1 % GEL Apply 2 g topically 4 (four) times daily. (Patient not taking: Reported on 02/09/2020) 350 g 0   No facility-administered medications prior to visit.    Allergies  Allergen Reactions  . Bee Venom Anaphylaxis  . Flexeril [Cyclobenzaprine Hcl] Anaphylaxis, Shortness Of Breath and Rash  . Hydrocodone Itching  . Ibuprofen Shortness Of Breath, Rash and Other (See Comments)    Stomach burning. Pt takes aspirin at home w/o problems.  . Nabumetone Shortness Of Breath, Itching and Other (See Comments)    "made me feel like my wind was maybe cutting off" (09/24/2012)  . [name] [Naproxen] Shortness Of Breath, Nausea And Vomiting and Rash    itching  . Toradol [Ketorolac Tromethamine] Anaphylaxis  . Tramadol Shortness Of Breath and Rash    Multiple previous morphine administrations ok (07/12/11-DJ) itching  . Ace Inhibitors Itching    Itching   . Other Itching    grits and grape jelly and cole slaw.  Itching of throat    Review of Systems  Musculoskeletal:  Positive for back pain and neck pain.  All other systems reviewed and are negative.      Objective:    Physical Exam Constitutional:      Appearance: He is normal weight.  Cardiovascular:     Rate and Rhythm: Normal rate and regular rhythm.  Pulmonary:  Effort: Pulmonary effort is normal.     Breath sounds: Normal breath sounds.  Abdominal:     General: Bowel sounds are normal.  Musculoskeletal:        General: Normal range of motion.     Cervical back: Rigidity present.  Skin:    General: Skin is warm and dry.  Neurological:     Mental Status: He is alert and oriented to person, place, and time.  Psychiatric:        Mood and Affect: Mood normal.        Thought Content: Thought content normal.        Judgment: Judgment normal.     BP (!) 136/96 (BP Location: Right Arm, Patient Position: Sitting, Cuff Size: Normal)   Pulse 96   Temp (!) 97.3 F (36.3 C) (Temporal)   Ht 5\' 8"  (1.727 m)   Wt 135 lb 12.8 oz (61.6 kg)   SpO2 99%   BMI 20.65 kg/m  Wt Readings from Last 3 Encounters:  02/09/20 135 lb 12.8 oz (61.6 kg)  01/12/20 137 lb (62.1 kg)  12/29/19 133 lb 6.4 oz (60.5 kg)    Health Maintenance Due  Topic Date Due  . Hepatitis C Screening  Never done  . TETANUS/TDAP  Never done  . COLONOSCOPY  01/15/2016    There are no preventive care reminders to display for this patient.   Lab Results  Component Value Date   TSH 0.656 12/14/2012   Lab Results  Component Value Date   WBC 17.6 (H) 08/03/2019   HGB 15.2 08/03/2019   HCT 47.5 08/03/2019   MCV 86.4 08/03/2019   PLT 261 08/03/2019   Lab Results  Component Value Date   NA 137 08/03/2019   K 3.9 08/03/2019   CO2 23 08/03/2019   GLUCOSE 98 08/03/2019   BUN 18 08/03/2019   CREATININE 0.95 08/03/2019   BILITOT 0.3 07/31/2019   ALKPHOS 100 07/25/2019   AST 12 07/31/2019   ALT 10 07/31/2019   PROT 6.3 07/31/2019   ALBUMIN 4.3 07/25/2019   CALCIUM 9.9 08/03/2019   ANIONGAP 10 08/03/2019   Lab  Results  Component Value Date   CHOL 180 12/15/2013   Lab Results  Component Value Date   HDL 47 12/15/2013   Lab Results  Component Value Date   LDLCALC 116 (H) 12/15/2013   Lab Results  Component Value Date   TRIG 85 12/15/2013   Lab Results  Component Value Date   CHOLHDL 3.8 12/15/2013   Lab Results  Component Value Date   HGBA1C 5.6 12/14/2012       Assessment & Plan:  Jakyle was seen today for blood pressure check.  Diagnoses and all orders for this visit:  Essential hypertension Continue amlodipine[pine 10mg , metoprolol 12.5mg  twice daily and chlorthalidone 50g daily. No t a goal but improving. Continue to monitor sodium intake, exercise as tolerated.   Chronic pain syndrome Awaiting surgery - medical cleared -will defer pain management to orthopedics -Neck/lumbar    No orders of the defined types were placed in this encounter.    Kerin Perna, NP

## 2020-02-18 ENCOUNTER — Other Ambulatory Visit: Payer: Self-pay | Admitting: Neurological Surgery

## 2020-03-05 ENCOUNTER — Encounter (HOSPITAL_COMMUNITY): Payer: Self-pay | Admitting: Vascular Surgery

## 2020-03-05 NOTE — Progress Notes (Signed)
I spoke to Mr. Zawislak                                                , he said it thought the surgery was on Wednesday they said I need Covid test on Tues and then surgery on Wednesday. (patient did have an appointment for Covid test on Tuesday, I had it cancelled since surgery was on Monday)   Mr. Lofquist said he cannot come on Monday, "I have my car in the shop and it will not be ready until Monday after 1000." I called Janett Billow, Dr. Myra Gianotti scheduler and told her.
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
## 2020-03-09 ENCOUNTER — Other Ambulatory Visit (HOSPITAL_COMMUNITY): Payer: Medicaid Other

## 2020-03-15 ENCOUNTER — Ambulatory Visit: Payer: Medicare Other | Admitting: Neurology

## 2020-03-15 ENCOUNTER — Telehealth: Payer: Self-pay

## 2020-03-15 ENCOUNTER — Encounter: Payer: Self-pay | Admitting: Neurology

## 2020-03-15 NOTE — Telephone Encounter (Signed)
Patient no-showed today's appointment with Dr. Rexene Alberts.

## 2020-03-17 ENCOUNTER — Other Ambulatory Visit: Payer: Self-pay | Admitting: Neurological Surgery

## 2020-03-23 ENCOUNTER — Telehealth: Payer: Self-pay | Admitting: *Deleted

## 2020-03-23 NOTE — Telephone Encounter (Signed)
Our office received a Cardiac Referral for pre op clearance from Dr. Kristeen Miss. I will bring the notes to Guinevere Ferrari, [place] for chart prep. I will send message to [facility] to help set pt up as a New Pt for pre op clearance.

## 2020-03-24 ENCOUNTER — Telehealth: Payer: Self-pay

## 2020-03-24 NOTE — Telephone Encounter (Signed)
NOTES ON FILE FROM [facility] [phone]

## 2020-03-24 NOTE — Telephone Encounter (Signed)
NOTES ON FILE FROM [facility] [phone], SENT REFERRAL TO SCHEDULING

## 2020-03-26 ENCOUNTER — Inpatient Hospital Stay (HOSPITAL_COMMUNITY): Admission: RE | Admit: 2020-03-26 | Payer: 59 | Source: Ambulatory Visit

## 2020-03-29 ENCOUNTER — Inpatient Hospital Stay (HOSPITAL_COMMUNITY): Admission: RE | Admit: 2020-03-29 | Payer: 59 | Source: Home / Self Care | Admitting: Neurological Surgery

## 2020-03-29 DIAGNOSIS — K56609 Unspecified intestinal obstruction, unspecified as to partial versus complete obstruction: Secondary | ICD-10-CM

## 2020-03-29 HISTORY — DX: Unspecified intestinal obstruction, unspecified as to partial versus complete obstruction: K56.609

## 2020-03-29 SURGERY — ANTERIOR LATERAL LUMBAR FUSION 2 LEVELS
Anesthesia: General | Laterality: Left

## 2020-03-31 ENCOUNTER — Encounter: Payer: Self-pay | Admitting: *Deleted

## 2020-03-31 ENCOUNTER — Ambulatory Visit: Payer: 59 | Admitting: Cardiology

## 2020-03-31 DIAGNOSIS — F1990 Other psychoactive substance use, unspecified, uncomplicated: Secondary | ICD-10-CM

## 2020-03-31 HISTORY — DX: Other psychoactive substance use, unspecified, uncomplicated: F19.90

## 2020-05-18 ENCOUNTER — Telehealth: Payer: Self-pay

## 2020-05-18 NOTE — Telephone Encounter (Signed)
NOTES ON FILE FROM  [facility] [phone], SENT NOTES TO SCHEDULING

## 2020-05-21 ENCOUNTER — Ambulatory Visit (INDEPENDENT_AMBULATORY_CARE_PROVIDER_SITE_OTHER): Payer: Medicare Other | Admitting: Primary Care

## 2020-06-06 ENCOUNTER — Encounter (HOSPITAL_COMMUNITY): Payer: Self-pay | Admitting: *Deleted

## 2020-06-06 ENCOUNTER — Emergency Department (HOSPITAL_COMMUNITY): Payer: 59

## 2020-06-06 ENCOUNTER — Other Ambulatory Visit: Payer: Self-pay

## 2020-06-06 ENCOUNTER — Emergency Department (HOSPITAL_COMMUNITY)
Admission: EM | Admit: 2020-06-06 | Discharge: 2020-06-06 | Disposition: A | Discharge status: Home / Self Care | Payer: 59 | Attending: Emergency Medicine | Admitting: Emergency Medicine

## 2020-06-06 DIAGNOSIS — M545 Low back pain: Secondary | ICD-10-CM | POA: Diagnosis not present

## 2020-06-06 DIAGNOSIS — W19XXXA Unspecified fall, initial encounter: Secondary | ICD-10-CM | POA: Insufficient documentation

## 2020-06-06 DIAGNOSIS — M25562 Pain in left knee: Secondary | ICD-10-CM | POA: Diagnosis not present

## 2020-06-06 DIAGNOSIS — Z5321 Procedure and treatment not carried out due to patient leaving prior to being seen by health care provider: Secondary | ICD-10-CM | POA: Diagnosis not present

## 2020-06-06 NOTE — ED Triage Notes (Signed)
Fell yesterday, left knee swollen and painful, also hurting some in lower back from fall.

## 2020-06-15 ENCOUNTER — Other Ambulatory Visit: Payer: Self-pay

## 2020-06-15 ENCOUNTER — Ambulatory Visit (INDEPENDENT_AMBULATORY_CARE_PROVIDER_SITE_OTHER): Payer: Medicare Other | Admitting: Internal Medicine

## 2020-06-15 ENCOUNTER — Encounter: Payer: Self-pay | Admitting: Internal Medicine

## 2020-06-15 VITALS — BP 154/100 | HR 78 | Ht 68.0 in | Wt 134.8 lb

## 2020-06-15 DIAGNOSIS — Z21 Asymptomatic human immunodeficiency virus [HIV] infection status: Secondary | ICD-10-CM | POA: Diagnosis not present

## 2020-06-15 DIAGNOSIS — Z01810 Encounter for preprocedural cardiovascular examination: Secondary | ICD-10-CM

## 2020-06-15 DIAGNOSIS — I77810 Thoracic aortic ectasia: Secondary | ICD-10-CM

## 2020-06-15 DIAGNOSIS — E7849 Other hyperlipidemia: Secondary | ICD-10-CM | POA: Diagnosis not present

## 2020-06-15 DIAGNOSIS — I5042 Chronic combined systolic (congestive) and diastolic (congestive) heart failure: Secondary | ICD-10-CM | POA: Diagnosis not present

## 2020-06-15 DIAGNOSIS — Z01818 Encounter for other preprocedural examination: Secondary | ICD-10-CM

## 2020-06-15 MED ORDER — CARVEDILOL 6.25 MG PO TABS: 6.2500 mg | ORAL_TABLET | Freq: Two times a day (BID) | ORAL | 3 refills | Status: AC
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
## 2020-06-15 NOTE — Patient Instructions (Addendum)
Medication Instructions:  Your physician has recommended you make the following change in your medication:  1.  STOP Metoprolol 2   START Carvedilol 6.25 taking 1 tablet twice a day  *If you need a refill on your cardiac medications before your next appointment, please call your pharmacy*   Lab Work: TODAY:  BMET & PRO BNP  If you have labs (blood work) drawn today and your tests are completely normal, you will receive your results only by: Marland Kitchen MyChart Message (if you have MyChart) OR . A paper copy in the mail If you have any lab test that is abnormal or we need to change your treatment, we will call you to review the results.   Testing/Procedures: Your physician has requested that you have an echocardiogram.  Thursday, 06/17/20 arrive at 1:45 for this study.   Echocardiography is a painless test that uses sound waves to create images of your heart. It provides your doctor with information about the size and shape of your heart and how well your heart's chambers and valves are working. This procedure takes approximately one hour. There are no restrictions for this procedure.   Follow-Up: At [facility], you and your health needs are our priority.  As part of our continuing mission to provide you with exceptional heart care, we have created designated Provider Care Teams.  These Care Teams include your primary Cardiologist (physician) and Advanced Practice Providers (APPs -  Physician Assistants and Nurse Practitioners) who all work together to provide you with the care you need, when you need it.  We recommend signing up for the patient portal called "MyChart".  Sign up information is provided on this After Visit Summary.  MyChart is used to connect with patients for Virtual Visits (Telemedicine).  Patients are able to view lab/test results, encounter notes, upcoming appointments, etc.  Non-urgent messages can be sent to your provider as well.   To learn more about what you can do with  MyChart, go to [email or web address].    Your next appointment:   3-4 month(s)  The format for your next appointment:   In Person  Provider:   Rudean Haskell, MD   Other Instructions  Echocardiogram An echocardiogram is a procedure that uses painless sound waves (ultrasound) to produce an image of the heart. Images from an echocardiogram can provide important information about:  Signs of coronary artery disease (CAD).  Aneurysm detection. An aneurysm is a weak or damaged part of an artery wall that bulges out from the normal force of blood pumping through the body.  Heart size and shape. Changes in the size or shape of the heart can be associated with certain conditions, including heart failure, aneurysm, and CAD.  Heart muscle function.  Heart valve function.  Signs of a past heart attack.  Fluid buildup around the heart.  Thickening of the heart muscle.  A tumor or infectious growth around the heart valves. Tell a health care provider about:  Any allergies you have.  All medicines you are taking, including vitamins, herbs, eye drops, creams, and over-the-counter medicines.  Any blood disorders you have.  Any surgeries you have had.  Any medical conditions you have.  Whether you are pregnant or may be pregnant. What are the risks? Generally, this is a safe procedure. However, problems may occur, including:  Allergic reaction to dye (contrast) that may be used during the procedure. What happens before the procedure? No specific preparation is needed. You may eat and drink normally. What happens  during the procedure?   An IV tube may be inserted into one of your veins.  You may receive contrast through this tube. A contrast is an injection that improves the quality of the pictures from your heart.  A gel will be applied to your chest.  A wand-like tool (transducer) will be moved over your chest. The gel will help to transmit the sound waves from  the transducer.  The sound waves will harmlessly bounce off of your heart to allow the heart images to be captured in real-time motion. The images will be recorded on a computer. The procedure may vary among health care providers and hospitals. What happens after the procedure?  You may return to your normal, everyday life, including diet, activities, and medicines, unless your health care provider tells you not to do that. Summary  An echocardiogram is a procedure that uses painless sound waves (ultrasound) to produce an image of the heart.  Images from an echocardiogram can provide important information about the size and shape of your heart, heart muscle function, heart valve function, and fluid buildup around your heart.  You do not need to do anything to prepare before this procedure. You may eat and drink normally.  After the echocardiogram is completed, you may return to your normal, everyday life, unless your health care provider tells you not to do that. This information is not intended to replace advice given to you by your health care provider. Make sure you discuss any questions you have with your health care provider. Document Revised: 12/26/2018 Document Reviewed: 10/07/2016 Elsevier Patient Education  [place].

## 2020-06-16 LAB — BASIC METABOLIC PANEL
BUN/Creatinine Ratio: 19 (ref 10–24)
BUN: 11 mg/dL (ref 8–27)
CO2: 26 mmol/L (ref 20–29)
Calcium: 9.7 mg/dL (ref 8.6–10.2)
Chloride: 101 mmol/L (ref 96–106)
Creatinine, Ser: 0.57 mg/dL — ABNORMAL LOW (ref 0.76–1.27)
GFR calc Af Amer: 125 mL/min/{1.73_m2} (ref >59)
GFR calc non Af Amer: 108 mL/min/{1.73_m2} (ref >59)
Glucose: 86 mg/dL (ref 65–99)
Potassium: 4.6 mmol/L (ref 3.5–5.2)
Sodium: 138 mmol/L (ref 134–144)

## 2020-06-16 LAB — PRO B NATRIURETIC PEPTIDE: NT-Pro BNP: 841 pg/mL — ABNORMAL HIGH (ref 0–376)

## 2020-06-17 ENCOUNTER — Emergency Department (HOSPITAL_COMMUNITY)
Admission: EM | Admit: 2020-06-17 | Discharge: 2020-06-17 | Disposition: A | Discharge status: Home / Self Care | Payer: 59 | Attending: Emergency Medicine | Admitting: Emergency Medicine

## 2020-06-17 ENCOUNTER — Encounter (HOSPITAL_COMMUNITY): Payer: Self-pay

## 2020-06-17 ENCOUNTER — Ambulatory Visit (HOSPITAL_BASED_OUTPATIENT_CLINIC_OR_DEPARTMENT_OTHER): Payer: 59

## 2020-06-17 ENCOUNTER — Emergency Department (HOSPITAL_COMMUNITY): Payer: 59

## 2020-06-17 ENCOUNTER — Telehealth: Payer: Self-pay

## 2020-06-17 ENCOUNTER — Other Ambulatory Visit: Payer: Self-pay

## 2020-06-17 DIAGNOSIS — E7849 Other hyperlipidemia: Secondary | ICD-10-CM

## 2020-06-17 DIAGNOSIS — I5042 Chronic combined systolic (congestive) and diastolic (congestive) heart failure: Secondary | ICD-10-CM

## 2020-06-17 DIAGNOSIS — Z01818 Encounter for other preprocedural examination: Secondary | ICD-10-CM

## 2020-06-17 DIAGNOSIS — I10 Essential (primary) hypertension: Secondary | ICD-10-CM

## 2020-06-17 DIAGNOSIS — I2510 Atherosclerotic heart disease of native coronary artery without angina pectoris: Secondary | ICD-10-CM | POA: Diagnosis not present

## 2020-06-17 DIAGNOSIS — Z79899 Other long term (current) drug therapy: Secondary | ICD-10-CM | POA: Insufficient documentation

## 2020-06-17 DIAGNOSIS — G8929 Other chronic pain: Secondary | ICD-10-CM | POA: Insufficient documentation

## 2020-06-17 DIAGNOSIS — I110 Hypertensive heart disease with heart failure: Secondary | ICD-10-CM | POA: Insufficient documentation

## 2020-06-17 DIAGNOSIS — M25562 Pain in left knee: Secondary | ICD-10-CM | POA: Insufficient documentation

## 2020-06-17 DIAGNOSIS — J45909 Unspecified asthma, uncomplicated: Secondary | ICD-10-CM | POA: Insufficient documentation

## 2020-06-17 DIAGNOSIS — Z21 Asymptomatic human immunodeficiency virus [HIV] infection status: Secondary | ICD-10-CM

## 2020-06-17 DIAGNOSIS — J449 Chronic obstructive pulmonary disease, unspecified: Secondary | ICD-10-CM | POA: Diagnosis not present

## 2020-06-17 DIAGNOSIS — Z01810 Encounter for preprocedural cardiovascular examination: Secondary | ICD-10-CM

## 2020-06-17 DIAGNOSIS — Z87891 Personal history of nicotine dependence: Secondary | ICD-10-CM | POA: Diagnosis not present

## 2020-06-17 LAB — ECHOCARDIOGRAM COMPLETE
Area-P 1/2: 3.48 cm2
Height: 68 in
P 1/2 time: 315 msec
S' Lateral: 4.5 cm
Weight: 2352 oz

## 2020-06-17 MED ORDER — FUROSEMIDE 20 MG PO TABS: 20.0000 mg | ORAL_TABLET | Freq: Every day | ORAL | 3 refills | Status: DC

## 2020-06-17 MED ORDER — ACETAMINOPHEN 500 MG PO TABS
1000.0000 mg | ORAL_TABLET | Freq: Once | ORAL | Status: AC
  Administered 2020-06-17: 1000 mg via ORAL
  Filled 2020-06-17: qty 2

## 2020-06-17 MED ORDER — FUROSEMIDE 20 MG PO TABS: 20.0000 mg | ORAL_TABLET | Freq: Every day | ORAL | 3 refills | Status: AC

## 2020-06-17 MED ORDER — DICLOFENAC SODIUM 1 % EX GEL
4.0000 g | Freq: Four times a day (QID) | CUTANEOUS | Status: DC
  Administered 2020-06-17: 4 g via TOPICAL
  Filled 2020-06-17: qty 100

## 2020-06-17 NOTE — Telephone Encounter (Signed)
The patient has been notified of the result and verbalized understanding.  All questions (if any) were answered. Antonieta Iba, RN 06/17/2020 9:11 AM  Rx for Lasix 20 mg has been sent in. Patient will repeat blood work 10/14

## 2020-06-17 NOTE — Addendum Note (Signed)
Addended by: Antonieta Iba on: 06/17/2020 10:34 AM   Modules accepted: Orders
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
## 2020-06-17 NOTE — ED Notes (Signed)
Discharge paperwork and prescription reviewed with pt.  Pt with no questions or concerns at time of dicharge, taken via wheelchair to ED entrance to wait for ride.

## 2020-06-17 NOTE — ED Triage Notes (Signed)
Patient c/o left knee pain x 1 week. Patient denies any injuries.

## 2020-06-17 NOTE — Telephone Encounter (Signed)
-----   Message from Werner Lean, MD sent at 06/16/2020  8:03 AM EDT ----- Results: Normal Kidney function, BNP upper limits.  Lasix naive with orthopnea. Plan: Start lasix 20 mg PO Daily.  Will check labs in 10-14 days (BMET, Mg).  Patient should call if sx do not improve at this time period.  Werner Lean, MD

## 2020-06-17 NOTE — Discharge Instructions (Signed)
Kenneth [name], it was great to see you today!  I am sorry that you have been having left-sided knee pain.  Is most likely due to arthritis and a tear in the meniscus in the left knee.  You have already had a steroid injection a few weeks ago with orthopedic doctors.  There is not much more we can do except give you a knee sling and gave you Tylenol and take the Voltaren gel over the affected area.  Please follow-up with orthopedics at your next appointment.  If you develop fevers, worsening knee pain, swelling, redness etc please come back to the ED.

## 2020-06-18 ENCOUNTER — Telehealth: Payer: Self-pay | Admitting: Internal Medicine

## 2020-06-18 NOTE — Telephone Encounter (Signed)
Called Patient to discuss Echo results.  Evidence of new echocardiogram findings (worse EF from prior).  Clarified surgery is time sensitive (pain) but not urgent (loss of neurologic function)  Patient is asymptomatic but with multiple risk factors for CAD and worsened EF.  Discussed options of proceeding for surgery.   Will bring back patient at next available slot to discuss in more detail:  ARB initiation (unclear ACEi Allergy) CCTA vs proceeding to surgery. GDMT eval.  Patient had no further questions.  Werner Lean, MD

## 2020-06-18 NOTE — Telephone Encounter (Signed)
Pt agreed to return 07/09/20 Friday at 1:20 pm to see Dr. Gasper Sells.

## 2020-06-28 ENCOUNTER — Telehealth: Payer: Self-pay | Admitting: Internal Medicine

## 2020-06-28 NOTE — Telephone Encounter (Signed)
     I went in pt's chart to see who had called him today.

## 2020-07-01 ENCOUNTER — Other Ambulatory Visit: Payer: 59 | Admitting: *Deleted

## 2020-07-01 ENCOUNTER — Other Ambulatory Visit: Payer: Self-pay

## 2020-07-01 DIAGNOSIS — I10 Essential (primary) hypertension: Secondary | ICD-10-CM

## 2020-07-01 DIAGNOSIS — I5042 Chronic combined systolic (congestive) and diastolic (congestive) heart failure: Secondary | ICD-10-CM

## 2020-07-02 LAB — BASIC METABOLIC PANEL
BUN/Creatinine Ratio: 21 (ref 10–24)
BUN: 15 mg/dL (ref 8–27)
CO2: 25 mmol/L (ref 20–29)
Calcium: 9.5 mg/dL (ref 8.6–10.2)
Chloride: 105 mmol/L (ref 96–106)
Creatinine, Ser: 0.72 mg/dL — ABNORMAL LOW (ref 0.76–1.27)
GFR calc Af Amer: 113 mL/min/{1.73_m2} (ref >59)
GFR calc non Af Amer: 98 mL/min/{1.73_m2} (ref >59)
Glucose: 87 mg/dL (ref 65–99)
Potassium: 3.9 mmol/L (ref 3.5–5.2)
Sodium: 141 mmol/L (ref 134–144)

## 2020-07-02 LAB — MAGNESIUM: Magnesium: 1.9 mg/dL (ref 1.6–2.3)

## 2020-07-09 ENCOUNTER — Encounter: Payer: Self-pay | Admitting: Internal Medicine

## 2020-07-09 ENCOUNTER — Ambulatory Visit (INDEPENDENT_AMBULATORY_CARE_PROVIDER_SITE_OTHER): Payer: 59 | Admitting: Internal Medicine

## 2020-07-09 ENCOUNTER — Other Ambulatory Visit: Payer: Self-pay

## 2020-07-09 VITALS — BP 140/90 | HR 94 | Ht 68.0 in | Wt 135.0 lb

## 2020-07-09 DIAGNOSIS — Z21 Asymptomatic human immunodeficiency virus [HIV] infection status: Secondary | ICD-10-CM | POA: Insufficient documentation

## 2020-07-09 DIAGNOSIS — I5020 Unspecified systolic (congestive) heart failure: Secondary | ICD-10-CM | POA: Diagnosis not present

## 2020-07-09 DIAGNOSIS — I10 Essential (primary) hypertension: Secondary | ICD-10-CM | POA: Diagnosis not present

## 2020-07-09 DIAGNOSIS — E7849 Other hyperlipidemia: Secondary | ICD-10-CM

## 2020-07-09 DIAGNOSIS — I712 Thoracic aortic aneurysm, without rupture, unspecified: Secondary | ICD-10-CM | POA: Insufficient documentation

## 2020-07-09 MED ORDER — SACUBITRIL-VALSARTAN 49-51 MG PO TABS: 1.0000 | ORAL_TABLET | Freq: Two times a day (BID) | ORAL | 5 refills | Status: AC

## 2020-07-09 MED ORDER — ATORVASTATIN CALCIUM 20 MG PO TABS: 20.0000 mg | ORAL_TABLET | Freq: Every day | ORAL | 3 refills | Status: AC

## 2020-07-09 NOTE — Patient Instructions (Addendum)
Medication Instructions:  Your physician has recommended you make the following change in your medication:  1.) stop chlorthalidone 2.) decrease atorvastatin (Lipitor) to 20 mg daily 3.) start sacubitril-valsartan (Entresto) 49-51 mg -one tablet twice a day  *If you need a refill on your cardiac medications before your next appointment, please call your pharmacy*   Lab Work: In 10 days - bmet, magnesium  If you have labs (blood work) drawn today and your tests are completely normal, you will receive your results only by: Marland Kitchen MyChart Message (if you have MyChart) OR . A paper copy in the mail If you have any lab test that is abnormal or we need to change your treatment, we will call you to review the results.   Testing/Procedures: none   Follow-Up: At [facility], you and your health needs are our priority.  As part of our continuing mission to provide you with exceptional heart care, we have created designated Provider Care Teams.  These Care Teams include your primary Cardiologist (physician) and Advanced Practice Providers (APPs -  Physician Assistants and Nurse Practitioners) who all work together to provide you with the care you need, when you need it.  We recommend signing up for the patient portal called "MyChart".  Sign up information is provided on this After Visit Summary.  MyChart is used to connect with patients for Virtual Visits (Telemedicine).  Patients are able to view lab/test results, encounter notes, upcoming appointments, etc.  Non-urgent messages can be sent to your provider as well.   To learn more about what you can do with MyChart, go to [email or web address].    Your next appointment:   6-8 week(s)  The format for your next appointment:   In Person  Provider:   Rudean Haskell, MD   Other Instructions We will send notes from today's visit to Dr. Ellene Route.
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
## 2020-07-11 ENCOUNTER — Emergency Department (HOSPITAL_COMMUNITY)
Admission: EM | Admit: 2020-07-11 | Discharge: 2020-07-11 | Disposition: A | Discharge status: Home / Self Care | Payer: 59 | Attending: Emergency Medicine | Admitting: Emergency Medicine

## 2020-07-11 ENCOUNTER — Other Ambulatory Visit: Payer: Self-pay

## 2020-07-11 DIAGNOSIS — X500XXA Overexertion from strenuous movement or load, initial encounter: Secondary | ICD-10-CM | POA: Diagnosis not present

## 2020-07-11 DIAGNOSIS — S39012A Strain of muscle, fascia and tendon of lower back, initial encounter: Secondary | ICD-10-CM | POA: Diagnosis not present

## 2020-07-11 DIAGNOSIS — I5042 Chronic combined systolic (congestive) and diastolic (congestive) heart failure: Secondary | ICD-10-CM | POA: Diagnosis not present

## 2020-07-11 DIAGNOSIS — Z87891 Personal history of nicotine dependence: Secondary | ICD-10-CM | POA: Insufficient documentation

## 2020-07-11 DIAGNOSIS — J449 Chronic obstructive pulmonary disease, unspecified: Secondary | ICD-10-CM | POA: Insufficient documentation

## 2020-07-11 DIAGNOSIS — Z79899 Other long term (current) drug therapy: Secondary | ICD-10-CM | POA: Insufficient documentation

## 2020-07-11 DIAGNOSIS — I2510 Atherosclerotic heart disease of native coronary artery without angina pectoris: Secondary | ICD-10-CM | POA: Insufficient documentation

## 2020-07-11 DIAGNOSIS — M79671 Pain in right foot: Secondary | ICD-10-CM | POA: Insufficient documentation

## 2020-07-11 DIAGNOSIS — J45909 Unspecified asthma, uncomplicated: Secondary | ICD-10-CM | POA: Insufficient documentation

## 2020-07-11 DIAGNOSIS — M545 Low back pain, unspecified: Secondary | ICD-10-CM

## 2020-07-11 DIAGNOSIS — S3992XA Unspecified injury of lower back, initial encounter: Secondary | ICD-10-CM | POA: Diagnosis present

## 2020-07-11 DIAGNOSIS — G8929 Other chronic pain: Secondary | ICD-10-CM | POA: Insufficient documentation

## 2020-07-11 DIAGNOSIS — I110 Hypertensive heart disease with heart failure: Secondary | ICD-10-CM | POA: Insufficient documentation

## 2020-07-11 MED ORDER — DIAZEPAM 5 MG PO TABS
5.0000 mg | ORAL_TABLET | Freq: Once | ORAL | Status: AC
  Administered 2020-07-11: 5 mg via ORAL
  Filled 2020-07-11: qty 1

## 2020-07-11 MED ORDER — DIAZEPAM 2 MG PO TABS: 2.0000 mg | ORAL_TABLET | Freq: Four times a day (QID) | ORAL | 0 refills | Status: DC | PRN

## 2020-07-11 NOTE — ED Triage Notes (Signed)
Pt here with complaints of lumbar back pain onset 4 days ago. Also c/o bump to bottom of right foot for 4-5 days. Denies known injury.
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
## 2020-07-21 ENCOUNTER — Other Ambulatory Visit: Payer: Self-pay

## 2020-07-21 ENCOUNTER — Other Ambulatory Visit: Payer: 59 | Admitting: *Deleted

## 2020-07-21 DIAGNOSIS — I5020 Unspecified systolic (congestive) heart failure: Secondary | ICD-10-CM

## 2020-07-21 DIAGNOSIS — I712 Thoracic aortic aneurysm, without rupture, unspecified: Secondary | ICD-10-CM

## 2020-07-21 DIAGNOSIS — Z21 Asymptomatic human immunodeficiency virus [HIV] infection status: Secondary | ICD-10-CM

## 2020-07-21 DIAGNOSIS — I10 Essential (primary) hypertension: Secondary | ICD-10-CM

## 2020-07-21 DIAGNOSIS — E7849 Other hyperlipidemia: Secondary | ICD-10-CM

## 2020-07-22 LAB — BASIC METABOLIC PANEL
BUN/Creatinine Ratio: 18 (ref 10–24)
BUN: 17 mg/dL (ref 8–27)
CO2: 24 mmol/L (ref 20–29)
Calcium: 9.9 mg/dL (ref 8.6–10.2)
Chloride: 104 mmol/L (ref 96–106)
Creatinine, Ser: 0.93 mg/dL (ref 0.76–1.27)
GFR calc Af Amer: 99 mL/min/{1.73_m2} (ref >59)
GFR calc non Af Amer: 86 mL/min/{1.73_m2} (ref >59)
Glucose: 109 mg/dL — ABNORMAL HIGH (ref 65–99)
Potassium: 3.9 mmol/L (ref 3.5–5.2)
Sodium: 141 mmol/L (ref 134–144)

## 2020-07-22 LAB — MAGNESIUM: Magnesium: 1.8 mg/dL (ref 1.6–2.3)

## 2020-08-23 ENCOUNTER — Ambulatory Visit: Payer: Medicare HMO | Admitting: Internal Medicine

## 2020-09-14 ENCOUNTER — Ambulatory Visit: Payer: 59 | Admitting: Internal Medicine

## 2020-09-20 ENCOUNTER — Other Ambulatory Visit: Payer: Self-pay | Admitting: Neurological Surgery

## 2020-09-27 NOTE — Pre-Procedure Instructions (Addendum)
Jarman Litton  09/27/2020      Your procedure is scheduled on Thursday, January 13.  Report to [facility], Main Entrance or Entrance "A" at 11:20 AM                Your surgery or procedure is scheduled to begin at 1:23 PM   Call this number if you have problems the morning of surgery: [phone]  This is the number for the Pre- Surgical Desk.                For any other questions, please call [phone], Monday - Friday 8 AM - 4 PM.   Remember:  Do not eat or drink after midnight   Take these medicines the morning of surgery with A SIP OF WATER: amLODipine (NORVASC) elvitegravir-cobicistat-emtricitabine-tenofovir (GENVOYA)  Carvedilol  If needed use : Ventolin Inhaler if needed, bring the inhaler with you.  STOP taking Aspirin, Aspirin Products (Goody Powder, Excedrin Migraine), Ibuprofen (Advil), Naproxen (Aleve), Vitamins and Herbal Products (ie Fish Oil).    Special instructions:    [place]- Preparing For Surgery  Before surgery, you can play an important role. Because skin is not sterile, your skin needs to be as free of germs as possible. You can reduce the number of germs on your skin by washing with CHG (chlorahexidine gluconate) Soap before surgery.  CHG is an antiseptic cleaner which kills germs and bonds with the skin to continue killing germs even after washing.    Oral Hygiene is also important to reduce your risk of infection.  Remember - BRUSH YOUR TEETH THE MORNING OF SURGERY WITH YOUR REGULAR TOOTHPASTE  Please do not use if you have an allergy to CHG or antibacterial soaps. If your skin becomes reddened/irritated stop using the CHG.  Do not shave (including legs and underarms) for at least 48 hours prior to first CHG shower. It is OK to shave your face.  Please follow these instructions carefully.   1. Shower the NIGHT BEFORE SURGERY and the MORNING OF SURGERY with CHG.   2. If you chose to wash your hair, wash your hair first as usual  with your normal shampoo.  3. After you shampoo, wash your face and private area with the soap you use at home, then rinse your hair and body thoroughly to remove the shampoo and soap.  4. Use CHG as you would any other liquid soap. You can apply CHG directly to the skin and wash gently with a scrungie or a clean washcloth.   5. Apply the CHG Soap to your body ONLY FROM THE NECK DOWN.  Do not use on open wounds or open sores. Avoid contact with your eyes, ears, mouth and genitals (private parts).   6. Wash thoroughly, paying special attention to the area where your surgery will be performed.  7. Thoroughly rinse your body with warm water from the neck down.  8. DO NOT shower/wash with your normal soap after using and rinsing off the CHG Soap.  9. Pat yourself dry with a CLEAN TOWEL.  10. Wear CLEAN PAJAMAS to bed the night before surgery, wear comfortable clothes the morning of surgery  11. Place CLEAN SHEETS on your bed the night of your first shower and DO NOT SLEEP WITH PETS.  Day of Surgery: Shower as instructed above. Do not apply any deodorants/lotions, powders or colognes.  Please wear clean clothes to the hospital/surgery center.   Remember to brush your teeth WITH  YOUR REGULAR TOOTHPASTE.  Do not wear jewelry, make-up or nail polish.  Do not shave 48 hours prior to surgery.  Men may shave face and neck.  Do not bring valuables to the hospital.  [facility] is not responsible for any belongings or valuables.  Contacts, dentures or bridgework may not be worn into surgery.  Leave your suitcase in the car.  After surgery it may be brought to your room.  For patients admitted to the hospital, discharge time will be determined by your treatment team.  Patients discharged the day of surgery will not be allowed to drive home.   Please read over the fact sheets that you were given.

## 2020-09-28 ENCOUNTER — Encounter (HOSPITAL_COMMUNITY): Payer: Self-pay

## 2020-09-28 ENCOUNTER — Other Ambulatory Visit: Payer: Self-pay

## 2020-09-28 ENCOUNTER — Encounter (HOSPITAL_COMMUNITY)
Admission: RE | Admit: 2020-09-28 | Discharge: 2020-09-28 | Disposition: A | Discharge status: Home / Self Care | Payer: Medicare HMO | Source: Ambulatory Visit | Attending: Neurological Surgery | Admitting: Neurological Surgery

## 2020-09-28 ENCOUNTER — Other Ambulatory Visit (HOSPITAL_COMMUNITY)
Admission: RE | Admit: 2020-09-28 | Discharge: 2020-09-28 | Disposition: A | Discharge status: Home / Self Care | Payer: Medicare HMO | Source: Ambulatory Visit | Attending: Neurological Surgery | Admitting: Neurological Surgery

## 2020-09-28 DIAGNOSIS — Z20822 Contact with and (suspected) exposure to covid-19: Secondary | ICD-10-CM | POA: Insufficient documentation

## 2020-09-28 DIAGNOSIS — Z79899 Other long term (current) drug therapy: Secondary | ICD-10-CM | POA: Diagnosis not present

## 2020-09-28 DIAGNOSIS — J439 Emphysema, unspecified: Secondary | ICD-10-CM | POA: Insufficient documentation

## 2020-09-28 DIAGNOSIS — Z87891 Personal history of nicotine dependence: Secondary | ICD-10-CM | POA: Insufficient documentation

## 2020-09-28 DIAGNOSIS — Z7901 Long term (current) use of anticoagulants: Secondary | ICD-10-CM | POA: Insufficient documentation

## 2020-09-28 DIAGNOSIS — E785 Hyperlipidemia, unspecified: Secondary | ICD-10-CM | POA: Insufficient documentation

## 2020-09-28 DIAGNOSIS — I110 Hypertensive heart disease with heart failure: Secondary | ICD-10-CM | POA: Diagnosis not present

## 2020-09-28 DIAGNOSIS — M5136 Other intervertebral disc degeneration, lumbar region: Secondary | ICD-10-CM | POA: Insufficient documentation

## 2020-09-28 DIAGNOSIS — Z21 Asymptomatic human immunodeficiency virus [HIV] infection status: Secondary | ICD-10-CM | POA: Insufficient documentation

## 2020-09-28 DIAGNOSIS — Z01812 Encounter for preprocedural laboratory examination: Secondary | ICD-10-CM | POA: Insufficient documentation

## 2020-09-28 DIAGNOSIS — I5042 Chronic combined systolic (congestive) and diastolic (congestive) heart failure: Secondary | ICD-10-CM | POA: Diagnosis not present

## 2020-09-28 DIAGNOSIS — Z01818 Encounter for other preprocedural examination: Secondary | ICD-10-CM | POA: Diagnosis present

## 2020-09-28 DIAGNOSIS — I2510 Atherosclerotic heart disease of native coronary artery without angina pectoris: Secondary | ICD-10-CM | POA: Insufficient documentation

## 2020-09-28 LAB — CBC
HCT: 41.8 % (ref 39.0–52.0)
Hemoglobin: 13.4 g/dL (ref 13.0–17.0)
MCH: 27.4 pg (ref 26.0–34.0)
MCHC: 32.1 g/dL (ref 30.0–36.0)
MCV: 85.5 fL (ref 80.0–100.0)
Platelets: 260 10*3/uL (ref 150–400)
RBC: 4.89 MIL/uL (ref 4.22–5.81)
RDW: 15.1 % (ref 11.5–15.5)
WBC: 7.2 10*3/uL (ref 4.0–10.5)
nRBC: 0 % (ref 0.0–0.2)

## 2020-09-28 LAB — BASIC METABOLIC PANEL
Anion gap: 7 (ref 5–15)
BUN: 14 mg/dL (ref 8–23)
CO2: 27 mmol/L (ref 22–32)
Calcium: 9 mg/dL (ref 8.9–10.3)
Chloride: 102 mmol/L (ref 98–111)
Creatinine, Ser: 0.8 mg/dL (ref 0.61–1.24)
GFR, Estimated: >60 mL/min (ref >60)
Glucose, Bld: 91 mg/dL (ref 70–99)
Potassium: 3.7 mmol/L (ref 3.5–5.1)
Sodium: 136 mmol/L (ref 135–145)

## 2020-09-28 LAB — SURGICAL PCR SCREEN
MRSA, PCR: NEGATIVE
Staphylococcus aureus: POSITIVE — AB

## 2020-09-28 LAB — SARS CORONAVIRUS 2 (TAT 6-24 HRS): SARS Coronavirus 2: NEGATIVE
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
## 2020-09-28 NOTE — Progress Notes (Signed)
Mr. Rann denies chest pain or shortness of breath. Patient denies any s/s of Covid in his home and has not been in connect with anyone who has s/s to his knowledge. Mr. Wenig will be tested after PAT appoinnntment  I asked Mr. Budzynski about all of the mediations that he told the pre admission call team member, patient said he is taking lasix and he will start taking Coreg.  PCP - Juluis Mire, NP at [facility]  Cardiologist - Dr, Estelle June  Chest x-ray - NA  EKG - 06/15/20  Stress Test -   ECHO - 05/2020  Cardiac Cath -   Sleep Study -  CPAP -   LABS-CBC , BMP, PCR  ASA-na  ERAS-no  HA1C-na Fasting Blood Sugar - na Checks Blood Sugar ___0_ times a day  Anesthesia-  Pt denies having chest pain, sob, or fever at this time. All instructions explained to the pt, with a verbal understanding of the material. Pt agrees to go over the instructions while at home for a better understanding. Pt also instructed to self quarantine after being tested for COVID-19. The opportunity to ask questions was provided.

## 2020-09-28 NOTE — Anesthesia Preprocedure Evaluation (Addendum)
Anesthesia Evaluation  Patient identified by MRN, date of birth, ID band Patient awake    Reviewed: Allergy & Precautions, H&P , NPO status , Patient's Chart, lab work & pertinent test results, reviewed documented beta blocker date and time   Airway Mallampati: II  TM Distance: >3 FB Neck ROM: Full    Dental no notable dental hx. (+) Teeth Intact, Dental Advisory Given   Pulmonary asthma , COPD,  COPD inhaler, former smoker,    Pulmonary exam normal breath sounds clear to auscultation       Cardiovascular hypertension, Pt. on medications and Pt. on home beta blockers + CAD and +CHF   Rhythm:Regular Rate:Normal     Neuro/Psych  Headaches, negative psych ROS   GI/Hepatic Neg liver ROS, GERD  ,  Endo/Other  negative endocrine ROS  Renal/GU negative Renal ROS  negative genitourinary   Musculoskeletal  (+) Arthritis , Osteoarthritis,    Abdominal   Peds  Hematology negative hematology ROS (+)   Anesthesia Other Findings   Reproductive/Obstetrics negative OB ROS                            Anesthesia Physical Anesthesia Plan  ASA: III  Anesthesia Plan: General   Post-op Pain Management:    Induction: Intravenous  PONV Risk Score and Plan: 3 and Ondansetron, Dexamethasone and Midazolam  Airway Management Planned: Oral ETT  Additional Equipment:   Intra-op Plan:   Post-operative Plan: Extubation in OR  Informed Consent: I have reviewed the patients History and Physical, chart, labs and discussed the procedure including the risks, benefits and alternatives for the proposed anesthesia with the patient or authorized representative who has indicated his/her understanding and acceptance.     Dental advisory given  Plan Discussed with: CRNA  Anesthesia Plan Comments: (See PAT note written 09/28/2020 by Myra Gianotti, PA-C. )       Anesthesia Quick Evaluation

## 2020-09-30 ENCOUNTER — Encounter (HOSPITAL_COMMUNITY): Payer: Self-pay | Admitting: Neurological Surgery

## 2020-09-30 ENCOUNTER — Encounter (HOSPITAL_COMMUNITY)
Admission: RE | Disposition: A | Discharge status: Home / Self Care | Payer: Self-pay | Source: Home / Self Care | Attending: Neurological Surgery

## 2020-09-30 ENCOUNTER — Ambulatory Visit (HOSPITAL_COMMUNITY): Payer: Medicare HMO | Admitting: Vascular Surgery

## 2020-09-30 ENCOUNTER — Ambulatory Visit (HOSPITAL_COMMUNITY): Payer: Medicare HMO | Admitting: Certified Registered Nurse Anesthetist

## 2020-09-30 ENCOUNTER — Ambulatory Visit (HOSPITAL_COMMUNITY): Payer: Medicare HMO

## 2020-09-30 ENCOUNTER — Observation Stay (HOSPITAL_COMMUNITY)
Admission: RE | Admit: 2020-09-30 | Discharge: 2020-09-30 | Disposition: A | Discharge status: Home / Self Care | Payer: Medicare HMO | Attending: Neurological Surgery | Admitting: Neurological Surgery

## 2020-09-30 ENCOUNTER — Other Ambulatory Visit: Payer: Self-pay

## 2020-09-30 DIAGNOSIS — M5116 Intervertebral disc disorders with radiculopathy, lumbar region: Principal | ICD-10-CM | POA: Insufficient documentation

## 2020-09-30 DIAGNOSIS — K5669 Other partial intestinal obstruction: Secondary | ICD-10-CM | POA: Diagnosis not present

## 2020-09-30 DIAGNOSIS — Z87891 Personal history of nicotine dependence: Secondary | ICD-10-CM | POA: Diagnosis not present

## 2020-09-30 DIAGNOSIS — I509 Heart failure, unspecified: Secondary | ICD-10-CM | POA: Diagnosis not present

## 2020-09-30 DIAGNOSIS — F1410 Cocaine abuse, uncomplicated: Secondary | ICD-10-CM | POA: Insufficient documentation

## 2020-09-30 DIAGNOSIS — J449 Chronic obstructive pulmonary disease, unspecified: Secondary | ICD-10-CM | POA: Insufficient documentation

## 2020-09-30 DIAGNOSIS — Z419 Encounter for procedure for purposes other than remedying health state, unspecified: Secondary | ICD-10-CM

## 2020-09-30 DIAGNOSIS — I110 Hypertensive heart disease with heart failure: Secondary | ICD-10-CM | POA: Diagnosis not present

## 2020-09-30 DIAGNOSIS — Z79899 Other long term (current) drug therapy: Secondary | ICD-10-CM | POA: Insufficient documentation

## 2020-09-30 DIAGNOSIS — J45909 Unspecified asthma, uncomplicated: Secondary | ICD-10-CM | POA: Diagnosis not present

## 2020-09-30 DIAGNOSIS — I2510 Atherosclerotic heart disease of native coronary artery without angina pectoris: Secondary | ICD-10-CM | POA: Insufficient documentation

## 2020-09-30 DIAGNOSIS — B20 Human immunodeficiency virus [HIV] disease: Secondary | ICD-10-CM | POA: Insufficient documentation

## 2020-09-30 DIAGNOSIS — M5126 Other intervertebral disc displacement, lumbar region: Secondary | ICD-10-CM | POA: Diagnosis present

## 2020-09-30 DIAGNOSIS — I723 Aneurysm of iliac artery: Secondary | ICD-10-CM | POA: Diagnosis not present

## 2020-09-30 HISTORY — PX: LUMBAR LAMINECTOMY/ DECOMPRESSION WITH MET-RX: SHX5959

## 2020-09-30 LAB — GLUCOSE, CAPILLARY
Glucose-Capillary: 84 mg/dL (ref 70–99)
Glucose-Capillary: 97 mg/dL (ref 70–99)

## 2020-09-30 SURGERY — LUMBAR LAMINECTOMY/ DECOMPRESSION WITH MET-RX
Anesthesia: General | Site: Spine Lumbar | Laterality: Left

## 2020-09-30 MED ORDER — SODIUM CHLORIDE 0.9 % IV SOLN: 250.0000 mL | INTRAVENOUS | Status: DC

## 2020-09-30 MED ORDER — 0.9 % SODIUM CHLORIDE (POUR BTL) OPTIME
TOPICAL | Status: DC | PRN
  Administered 2020-09-30: 1000 mL

## 2020-09-30 MED ORDER — ONDANSETRON HCL 4 MG/2ML IJ SOLN
INTRAMUSCULAR | Status: AC
  Filled 2020-09-30: qty 2

## 2020-09-30 MED ORDER — MENTHOL 3 MG MT LOZG: 1.0000 | LOZENGE | OROMUCOSAL | Status: DC | PRN

## 2020-09-30 MED ORDER — CHLORHEXIDINE GLUCONATE 0.12 % MT SOLN
15.0000 mL | Freq: Once | OROMUCOSAL | Status: AC
  Administered 2020-09-30: 15 mL via OROMUCOSAL
  Filled 2020-09-30: qty 15

## 2020-09-30 MED ORDER — PHENYLEPHRINE HCL-NACL 10-0.9 MG/250ML-% IV SOLN
INTRAVENOUS | Status: DC | PRN
  Administered 2020-09-30: 25 ug/min via INTRAVENOUS

## 2020-09-30 MED ORDER — ACETAMINOPHEN 325 MG PO TABS: 650.0000 mg | ORAL_TABLET | ORAL | Status: DC | PRN

## 2020-09-30 MED ORDER — CHLORHEXIDINE GLUCONATE CLOTH 2 % EX PADS: 6.0000 | MEDICATED_PAD | Freq: Once | CUTANEOUS | Status: DC

## 2020-09-30 MED ORDER — AMLODIPINE BESYLATE 5 MG PO TABS: 10.0000 mg | ORAL_TABLET | Freq: Every day | ORAL | Status: DC

## 2020-09-30 MED ORDER — HEMOSTATIC AGENTS (NO CHARGE) OPTIME
TOPICAL | Status: DC | PRN
  Administered 2020-09-30: 1 via TOPICAL

## 2020-09-30 MED ORDER — FENTANYL CITRATE (PF) 250 MCG/5ML IJ SOLN
INTRAMUSCULAR | Status: DC | PRN
  Administered 2020-09-30 (×3): 50 ug via INTRAVENOUS

## 2020-09-30 MED ORDER — GABAPENTIN 100 MG PO CAPS: 100.0000 mg | ORAL_CAPSULE | Freq: Three times a day (TID) | ORAL | Status: DC | PRN

## 2020-09-30 MED ORDER — SENNA 8.6 MG PO TABS: 1.0000 | ORAL_TABLET | Freq: Two times a day (BID) | ORAL | Status: DC

## 2020-09-30 MED ORDER — ALBUMIN HUMAN 5 % IV SOLN: INTRAVENOUS | Status: DC | PRN

## 2020-09-30 MED ORDER — POLYETHYLENE GLYCOL 3350 17 G PO PACK: 17.0000 g | PACK | Freq: Every day | ORAL | Status: DC | PRN

## 2020-09-30 MED ORDER — LIDOCAINE 2% (20 MG/ML) 5 ML SYRINGE
INTRAMUSCULAR | Status: DC | PRN
  Administered 2020-09-30: 60 mg via INTRAVENOUS

## 2020-09-30 MED ORDER — PHENYLEPHRINE 40 MCG/ML (10ML) SYRINGE FOR IV PUSH (FOR BLOOD PRESSURE SUPPORT)
PREFILLED_SYRINGE | INTRAVENOUS | Status: DC | PRN
  Administered 2020-09-30: 120 ug via INTRAVENOUS
  Administered 2020-09-30 (×2): 80 ug via INTRAVENOUS

## 2020-09-30 MED ORDER — METHOCARBAMOL 500 MG PO TABS
500.0000 mg | ORAL_TABLET | Freq: Four times a day (QID) | ORAL | Status: DC | PRN
  Administered 2020-09-30: 500 mg via ORAL
  Filled 2020-09-30: qty 1

## 2020-09-30 MED ORDER — OXYCODONE-ACETAMINOPHEN 5-325 MG PO TABS: 1.0000 | ORAL_TABLET | ORAL | Status: DC | PRN

## 2020-09-30 MED ORDER — ACETAMINOPHEN 650 MG RE SUPP: 650.0000 mg | RECTAL | Status: DC | PRN

## 2020-09-30 MED ORDER — ROCURONIUM BROMIDE 10 MG/ML (PF) SYRINGE
PREFILLED_SYRINGE | INTRAVENOUS | Status: DC | PRN
  Administered 2020-09-30: 20 mg via INTRAVENOUS
  Administered 2020-09-30: 50 mg via INTRAVENOUS

## 2020-09-30 MED ORDER — METHOCARBAMOL 1000 MG/10ML IJ SOLN
500.0000 mg | Freq: Four times a day (QID) | INTRAVENOUS | Status: DC | PRN
  Filled 2020-09-30: qty 5

## 2020-09-30 MED ORDER — PROPOFOL 10 MG/ML IV BOLUS
INTRAVENOUS | Status: DC | PRN
Start: 2020-09-30 — End: 2020-09-30
  Administered 2020-09-30: 100 mg via INTRAVENOUS
  Administered 2020-09-30: 30 mg via INTRAVENOUS

## 2020-09-30 MED ORDER — PHENOL 1.4 % MT LIQD: 1.0000 | OROMUCOSAL | Status: DC | PRN

## 2020-09-30 MED ORDER — ALUM & MAG HYDROXIDE-SIMETH 200-200-20 MG/5ML PO SUSP: 30.0000 mL | Freq: Four times a day (QID) | ORAL | Status: DC | PRN

## 2020-09-30 MED ORDER — ONDANSETRON HCL 4 MG PO TABS: 4.0000 mg | ORAL_TABLET | Freq: Four times a day (QID) | ORAL | Status: DC | PRN

## 2020-09-30 MED ORDER — BISACODYL 10 MG RE SUPP: 10.0000 mg | Freq: Every day | RECTAL | Status: DC | PRN

## 2020-09-30 MED ORDER — PROPOFOL 10 MG/ML IV BOLUS
INTRAVENOUS | Status: AC
  Filled 2020-09-30: qty 20

## 2020-09-30 MED ORDER — ROCURONIUM BROMIDE 10 MG/ML (PF) SYRINGE
PREFILLED_SYRINGE | INTRAVENOUS | Status: AC
  Filled 2020-09-30: qty 10

## 2020-09-30 MED ORDER — THROMBIN 5000 UNITS EX SOLR
OROMUCOSAL | Status: DC | PRN
  Administered 2020-09-30: 5 mL via TOPICAL

## 2020-09-30 MED ORDER — MORPHINE SULFATE (PF) 2 MG/ML IV SOLN
2.0000 mg | INTRAVENOUS | Status: DC | PRN
Start: 2020-09-30 — End: 2020-10-01
  Administered 2020-09-30: 4 mg via INTRAVENOUS
  Filled 2020-09-30: qty 2

## 2020-09-30 MED ORDER — EPHEDRINE SULFATE-NACL 50-0.9 MG/10ML-% IV SOSY
PREFILLED_SYRINGE | INTRAVENOUS | Status: DC | PRN
  Administered 2020-09-30 (×2): 10 mg via INTRAVENOUS

## 2020-09-30 MED ORDER — ACETAMINOPHEN 500 MG PO TABS: 1000.0000 mg | ORAL_TABLET | Freq: Four times a day (QID) | ORAL | Status: DC | PRN

## 2020-09-30 MED ORDER — ONDANSETRON HCL 4 MG/2ML IJ SOLN: 4.0000 mg | Freq: Four times a day (QID) | INTRAMUSCULAR | Status: DC | PRN

## 2020-09-30 MED ORDER — THROMBIN 5000 UNITS EX SOLR
CUTANEOUS | Status: DC | PRN
  Administered 2020-09-30 (×2): 5000 [IU] via TOPICAL

## 2020-09-30 MED ORDER — ORAL CARE MOUTH RINSE: 15.0000 mL | Freq: Once | OROMUCOSAL | Status: AC

## 2020-09-30 MED ORDER — THROMBIN 5000 UNITS EX SOLR
CUTANEOUS | Status: AC
  Filled 2020-09-30: qty 5000

## 2020-09-30 MED ORDER — DEXAMETHASONE SODIUM PHOSPHATE 10 MG/ML IJ SOLN
INTRAMUSCULAR | Status: AC
  Filled 2020-09-30: qty 1

## 2020-09-30 MED ORDER — THROMBIN 5000 UNITS EX SOLR
CUTANEOUS | Status: AC
  Filled 2020-09-30: qty 10000

## 2020-09-30 MED ORDER — DEXAMETHASONE SODIUM PHOSPHATE 10 MG/ML IJ SOLN
INTRAMUSCULAR | Status: DC | PRN
  Administered 2020-09-30: 10 mg via INTRAVENOUS

## 2020-09-30 MED ORDER — DOCUSATE SODIUM 100 MG PO CAPS: 100.0000 mg | ORAL_CAPSULE | Freq: Two times a day (BID) | ORAL | Status: DC

## 2020-09-30 MED ORDER — ALBUTEROL SULFATE HFA 108 (90 BASE) MCG/ACT IN AERS
INHALATION_SPRAY | RESPIRATORY_TRACT | Status: DC | PRN
Start: 2020-09-30 — End: 2020-09-30
  Administered 2020-09-30: 2 via RESPIRATORY_TRACT

## 2020-09-30 MED ORDER — OXYCODONE-ACETAMINOPHEN 5-325 MG PO TABS: 1.0000 | ORAL_TABLET | Freq: Four times a day (QID) | ORAL | 0 refills | Status: DC | PRN

## 2020-09-30 MED ORDER — HYDROMORPHONE HCL 1 MG/ML IJ SOLN: 0.2500 mg | INTRAMUSCULAR | Status: DC | PRN

## 2020-09-30 MED ORDER — LIDOCAINE-EPINEPHRINE 1 %-1:100000 IJ SOLN
INTRAMUSCULAR | Status: AC
  Filled 2020-09-30: qty 1

## 2020-09-30 MED ORDER — ONDANSETRON HCL 4 MG/2ML IJ SOLN
INTRAMUSCULAR | Status: DC | PRN
  Administered 2020-09-30: 4 mg via INTRAVENOUS

## 2020-09-30 MED ORDER — ACETAMINOPHEN 500 MG PO TABS
1000.0000 mg | ORAL_TABLET | Freq: Once | ORAL | Status: AC
  Administered 2020-09-30: 1000 mg via ORAL

## 2020-09-30 MED ORDER — LACTATED RINGERS IV SOLN: INTRAVENOUS | Status: DC

## 2020-09-30 MED ORDER — BUPIVACAINE HCL (PF) 0.5 % IJ SOLN
INTRAMUSCULAR | Status: DC | PRN
  Administered 2020-09-30: 5 mL
  Administered 2020-09-30: 25 mL

## 2020-09-30 MED ORDER — MIDAZOLAM HCL 5 MG/5ML IJ SOLN
INTRAMUSCULAR | Status: DC | PRN
  Administered 2020-09-30: 2 mg via INTRAVENOUS

## 2020-09-30 MED ORDER — ELVITEG-COBIC-EMTRICIT-TENOFAF 150-150-200-10 MG PO TABS
1.0000 | ORAL_TABLET | Freq: Every day | ORAL | Status: DC
  Filled 2020-09-30: qty 1

## 2020-09-30 MED ORDER — BUPIVACAINE HCL (PF) 0.5 % IJ SOLN
INTRAMUSCULAR | Status: AC
  Filled 2020-09-30: qty 30

## 2020-09-30 MED ORDER — SODIUM CHLORIDE 0.9% FLUSH: 3.0000 mL | Freq: Two times a day (BID) | INTRAVENOUS | Status: DC

## 2020-09-30 MED ORDER — DIAZEPAM 5 MG PO TABS: 5.0000 mg | ORAL_TABLET | Freq: Four times a day (QID) | ORAL | 0 refills | Status: AC | PRN

## 2020-09-30 MED ORDER — LIDOCAINE 2% (20 MG/ML) 5 ML SYRINGE
INTRAMUSCULAR | Status: AC
  Filled 2020-09-30: qty 5

## 2020-09-30 MED ORDER — FLEET ENEMA 7-19 GM/118ML RE ENEM: 1.0000 | ENEMA | Freq: Once | RECTAL | Status: DC | PRN

## 2020-09-30 MED ORDER — LIDOCAINE-EPINEPHRINE 1 %-1:100000 IJ SOLN
INTRAMUSCULAR | Status: DC | PRN
  Administered 2020-09-30: 5 mL

## 2020-09-30 MED ORDER — FENTANYL CITRATE (PF) 250 MCG/5ML IJ SOLN
INTRAMUSCULAR | Status: AC
  Filled 2020-09-30: qty 5

## 2020-09-30 MED ORDER — SODIUM CHLORIDE 0.9% FLUSH: 3.0000 mL | INTRAVENOUS | Status: DC | PRN

## 2020-09-30 MED ORDER — PHENYLEPHRINE 40 MCG/ML (10ML) SYRINGE FOR IV PUSH (FOR BLOOD PRESSURE SUPPORT)
PREFILLED_SYRINGE | INTRAVENOUS | Status: AC
  Filled 2020-09-30: qty 10

## 2020-09-30 MED ORDER — CEFAZOLIN SODIUM-DEXTROSE 2-4 GM/100ML-% IV SOLN
2.0000 g | INTRAVENOUS | Status: AC
  Administered 2020-09-30: 2 g via INTRAVENOUS
  Filled 2020-09-30: qty 100

## 2020-09-30 MED ORDER — SUGAMMADEX SODIUM 200 MG/2ML IV SOLN
INTRAVENOUS | Status: DC | PRN
  Administered 2020-09-30: 200 mg via INTRAVENOUS

## 2020-09-30 MED ORDER — ALBUTEROL SULFATE HFA 108 (90 BASE) MCG/ACT IN AERS
2.0000 | INHALATION_SPRAY | Freq: Four times a day (QID) | RESPIRATORY_TRACT | Status: DC | PRN
  Filled 2020-09-30: qty 6.7

## 2020-09-30 MED ORDER — MIDAZOLAM HCL 2 MG/2ML IJ SOLN
INTRAMUSCULAR | Status: AC
  Filled 2020-09-30: qty 2

## 2020-09-30 SURGICAL SUPPLY — 50 items
BAND RUBBER #18 3X1/16 STRL (MISCELLANEOUS) ×4 IMPLANT
BLADE CLIPPER SURG (BLADE) IMPLANT
BLADE SURG 15 STRL LF DISP TIS (BLADE) ×1 IMPLANT
BLADE SURG 15 STRL SS (BLADE) ×1
BUR 2.5 MTCH HD 16 (BUR) ×2 IMPLANT
BUR MATCHSTICK NEURO 3.0 LAGG (BURR) ×2 IMPLANT
CANISTER SUCT 3000ML PPV (MISCELLANEOUS) ×2 IMPLANT
COVER WAND RF STERILE (DRAPES) IMPLANT
DECANTER SPIKE VIAL GLASS SM (MISCELLANEOUS) ×2 IMPLANT
DERMABOND ADVANCED (GAUZE/BANDAGES/DRESSINGS) ×1
DERMABOND ADVANCED .7 DNX12 (GAUZE/BANDAGES/DRESSINGS) ×1 IMPLANT
DEVICE DISSECT PLASMABLAD 3.0S (MISCELLANEOUS) ×1 IMPLANT
DRAPE C-ARM 42X72 X-RAY (DRAPES) ×4 IMPLANT
DRAPE LAPAROTOMY 100X72X124 (DRAPES) ×2 IMPLANT
DRAPE MICROSCOPE LEICA (MISCELLANEOUS) ×2 IMPLANT
DURAPREP 6ML APPLICATOR 50/CS (WOUND CARE) ×2 IMPLANT
ELECT BLADE 6.5 EXT (BLADE) ×2 IMPLANT
ELECT REM PT RETURN 9FT ADLT (ELECTROSURGICAL) ×2
ELECTRODE REM PT RTRN 9FT ADLT (ELECTROSURGICAL) ×1 IMPLANT
GAUZE 4X4 16PLY RFD (DISPOSABLE) IMPLANT
GLOVE BIOGEL PI IND STRL 7.5 (GLOVE) ×1 IMPLANT
GLOVE BIOGEL PI IND STRL 8.5 (GLOVE) ×1 IMPLANT
GLOVE BIOGEL PI INDICATOR 7.5 (GLOVE) ×1
GLOVE BIOGEL PI INDICATOR 8.5 (GLOVE) ×1
GLOVE ECLIPSE 8.5 STRL (GLOVE) ×2 IMPLANT
GLOVE EXAM NITRILE XL STR (GLOVE) IMPLANT
GLOVE SURG SS PI 7.0 STRL IVOR (GLOVE) ×4 IMPLANT
GLOVE SURG SS PI 7.5 STRL IVOR (GLOVE) ×12 IMPLANT
GOWN STRL REUS W/ TWL LRG LVL3 (GOWN DISPOSABLE) IMPLANT
GOWN STRL REUS W/ TWL XL LVL3 (GOWN DISPOSABLE) ×4 IMPLANT
GOWN STRL REUS W/TWL 2XL LVL3 (GOWN DISPOSABLE) ×2 IMPLANT
GOWN STRL REUS W/TWL LRG LVL3 (GOWN DISPOSABLE)
GOWN STRL REUS W/TWL XL LVL3 (GOWN DISPOSABLE) ×4
KIT BASIN OR (CUSTOM PROCEDURE TRAY) ×2 IMPLANT
KIT TURNOVER KIT B (KITS) ×2 IMPLANT
NEEDLE HYPO 18GX1.5 BLUNT FILL (NEEDLE) IMPLANT
NEEDLE HYPO 22GX1.5 SAFETY (NEEDLE) ×2 IMPLANT
NEEDLE SPNL 20GX3.5 QUINCKE YW (NEEDLE) ×2 IMPLANT
NS IRRIG 1000ML POUR BTL (IV SOLUTION) ×2 IMPLANT
PACK LAMINECTOMY NEURO (CUSTOM PROCEDURE TRAY) ×2 IMPLANT
PAD ARMBOARD 7.5X6 YLW CONV (MISCELLANEOUS) ×10 IMPLANT
PATTIES SURGICAL .5 X1 (DISPOSABLE) ×2 IMPLANT
PLASMABLADE 3.0S (MISCELLANEOUS) ×2
SPONGE SURGIFOAM ABS GEL SZ50 (HEMOSTASIS) ×2 IMPLANT
SUT VIC AB 3-0 SH 8-18 (SUTURE) ×2 IMPLANT
SUT VIC AB 4-0 RB1 18 (SUTURE) ×2 IMPLANT
SYR 5ML LL (SYRINGE) IMPLANT
TOWEL GREEN STERILE (TOWEL DISPOSABLE) ×2 IMPLANT
TOWEL GREEN STERILE FF (TOWEL DISPOSABLE) ×2 IMPLANT
WATER STERILE IRR 1000ML POUR (IV SOLUTION) ×2 IMPLANT

## 2020-09-30 NOTE — Anesthesia Procedure Notes (Signed)
Procedure Name: Intubation Date/Time: 09/30/2020 2:06 PM Performed by: Colin Benton, CRNA Pre-anesthesia Checklist: Patient identified, Emergency Drugs available, Suction available and Patient being monitored Patient Re-evaluated:Patient Re-evaluated prior to induction Oxygen Delivery Method: Circle system utilized Preoxygenation: Pre-oxygenation with 100% oxygen Induction Type: IV induction Ventilation: Mask ventilation without difficulty Laryngoscope Size: Miller and 2 Grade View: Grade II Tube type: Oral Tube size: 7.5 mm Number of attempts: 2 Airway Equipment and Method: Stylet and Oral airway Placement Confirmation: ETT inserted through vocal cords under direct vision,  positive ETCO2 and breath sounds checked- equal and bilateral Secured at: 22 cm Tube secured with: Tape Dental Injury: Teeth and Oropharynx as per pre-operative assessment  Comments: DL x 1 with Mil 2.  Grade 2 view but unable to pass ETT anterior into cords.  DL x 2 with Mil 2.  Successful intubation.  EBBS and VSS.

## 2020-09-30 NOTE — Discharge Instructions (Signed)
Wound Care Leave incision open to air. You may shower. Do not scrub directly on incision.  Do not put any creams, lotions, or ointments on incision. Activity Walk each and every day, increasing distance each day. No lifting greater than 5 lbs.   No driving for 2 weeks; may ride as a passenger locally. Diet Resume your normal diet.  Return to Work Will be discussed at you follow up appointment. Call Your Doctor If Any of These Occur Redness, drainage, or swelling at the wound.  Temperature greater than 101 degrees. Severe pain not relieved by pain medication. Increased difficulty swallowing. Incision starts to come apart. Follow Up Appt Call today for appointment in 2-4 weeks ([phone]) or for problems.  If you have any hardware placed in your spine, you will need an x-ray before your appointment.

## 2020-09-30 NOTE — Discharge Summary (Signed)
Physician Discharge Summary  Patient ID: Jaquawn Saffran MRN: [id number] DOB/AGE: [birth year] 65 y.o.  Admit date: 09/30/2020 Discharge date: 09/30/2020  Admission Diagnoses: Herniated nucleus pulposus L4-L5 with lumbar radiculopathy  Discharge Diagnoses: Herniated nucleus pulposus L4-L5 with lumbar radiculopathy left Active Problems:   Herniated nucleus pulposus, L4-5   Discharged Condition: good  Hospital Course: Patient was admitted to undergo surgical decompression at L4-L5 on the left side tolerated surgery well.  Consults: None  Significant Diagnostic Studies: None  Treatments: surgery: Extraforaminal discectomy L4-L5 left with Metrix retractor operating microscope  Discharge Exam: Blood pressure 111/90, pulse 80, temperature 97.6 F (36.4 C), temperature source Oral, resp. rate 17, height 5\' 8"  (1.727 m), weight 64.4 kg, SpO2 (!) 88 %. Incision is clean and dry Station and gait are intact  Disposition: Discharge disposition: 01-Home or Self Care       Discharge Instructions    Call MD for:  redness, tenderness, or signs of infection (pain, swelling, redness, odor or green/yellow discharge around incision site)   Complete by: As directed    Call MD for:  severe uncontrolled pain   Complete by: As directed    Call MD for:  temperature >100.4   Complete by: As directed    Diet - low sodium heart healthy   Complete by: As directed    Discharge wound care:   Complete by: As directed    Okay to shower. Do not apply salves or appointments to incision. No heavy lifting with the upper extremities greater than 10 pounds. May resume driving when not requiring pain medication and patient feels comfortable with doing so.   Incentive spirometry RT   Complete by: As directed    Increase activity slowly   Complete by: As directed      Allergies as of 09/30/2020      Reactions   Bee Venom Anaphylaxis   Flexeril [cyclobenzaprine Hcl] Anaphylaxis, Shortness Of Breath, Rash    Hydrocodone Itching   Ibuprofen Shortness Of Breath, Rash, Other (See Comments)   Stomach burning. Pt takes aspirin at home w/o problems.   Nabumetone Shortness Of Breath, Itching, Other (See Comments)   "made me feel like my wind was maybe cutting off" (09/24/2012)   [name] [naproxen] Shortness Of Breath, Nausea And Vomiting, Rash   itching   Toradol [ketorolac Tromethamine] Anaphylaxis   Tramadol Shortness Of Breath, Rash   Multiple previous morphine administrations ok (07/12/11-DJ) itching   Ace Inhibitors Itching      Other Itching   grits and grape jelly and cole slaw.  Itching of throat      Medication List    TAKE these medications   acetaminophen 500 MG tablet Commonly known as: TYLENOL Take 1,000 mg by mouth every 6 (six) hours as needed for moderate pain.   amLODipine 10 MG tablet Commonly known as: NORVASC Take 1 tablet (10 mg total) by mouth daily.   atorvastatin 20 MG tablet Commonly known as: LIPITOR Take 1 tablet (20 mg total) by mouth daily.   baclofen 10 MG tablet Commonly known as: LIORESAL Take 1 tablet (10 mg total) by mouth 3 (three) times daily.   carvedilol 6.25 MG tablet Commonly known as: COREG Take 1 tablet (6.25 mg total) by mouth 2 (two) times daily.   diazepam 5 MG tablet Commonly known as: Valium Take 1 tablet (5 mg total) by mouth every 6 (six) hours as needed for muscle spasms. What changed:   medication strength  how much to take  diclofenac Sodium 1 % Gel Commonly known as: Voltaren Apply 2 g topically 4 (four) times daily. What changed:   when to take this  reasons to take this   EPINEPHrine 0.3 mg/0.3 mL Soaj injection Commonly known as: EPI-PEN Inject 0.3 mg into the muscle once as needed for anaphylaxis.   furosemide 20 MG tablet Commonly known as: LASIX Take 1 tablet (20 mg total) by mouth daily. What changed: additional instructions   gabapentin 100 MG capsule Commonly known as: Neurontin Take 1 capsule (100  mg total) by mouth 3 (three) times daily. What changed:   when to take this  reasons to take this   Genvoya 150-150-200-10 MG Tabs tablet Generic drug: elvitegravir-cobicistat-emtricitabine-tenofovir Take 1 tablet by mouth daily with breakfast.   methocarbamol 500 MG tablet Commonly known as: Robaxin Take 1 tablet (500 mg total) by mouth 4 (four) times daily.   oxyCODONE-acetaminophen 5-325 MG tablet Commonly known as: PERCOCET/ROXICET Take 1-2 tablets by mouth every 6 (six) hours as needed for moderate pain or severe pain.   sacubitril-valsartan 49-51 MG Commonly known as: ENTRESTO Take 1 tablet by mouth 2 (two) times daily.   Ventolin HFA 108 (90 Base) MCG/ACT inhaler Generic drug: albuterol Inhale 2 puffs every 6 (six) hours as needed into the lungs for wheezing or shortness of breath.            Discharge Care Instructions  (From admission, onward)         Start     Ordered   09/30/20 0000  Discharge wound care:       Comments: Okay to shower. Do not apply salves or appointments to incision. No heavy lifting with the upper extremities greater than 10 pounds. May resume driving when not requiring pain medication and patient feels comfortable with doing so.   09/30/20 1825           Signed: Blanchie Dessert Troy Kanouse 09/30/2020, 6:26 PM

## 2020-09-30 NOTE — Transfer of Care (Signed)
Immediate Anesthesia Transfer of Care Note  Patient: Kenneth [name]  Procedure(s) Performed: Left Lumbar Four-Five Extraforaminal microdiscectomy (Left Spine Lumbar)  Patient Location: PACU  Anesthesia Type:General  Level of Consciousness: awake, alert  and oriented  Airway & Oxygen Therapy: Patient Spontanous Breathing and Patient connected to nasal cannula oxygen  Post-op Assessment: Report given to RN and Post -op Vital signs reviewed and stable  Post vital signs: Reviewed and stable  Last Vitals:  Vitals Value Taken Time  BP 114/79 09/30/20 1616  Temp    Pulse 88 09/30/20 1623  Resp 19 09/30/20 1622  SpO2 99 % 09/30/20 1623  Vitals shown include unvalidated device data.  Last Pain:  Vitals:   09/30/20 1134  TempSrc:   PainSc: 4       Patients Stated Pain Goal: 4 (22/48/25 0037)  Complications: No complications documented.
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
## 2020-09-30 NOTE — Op Note (Signed)
Date of surgery: 09/30/2020 Preoperative diagnosis: Herniated nucleus pulposus L4-L5 left extraforaminal with radiculopathy Postoperative diagnosis: Same Procedure: Extraforaminal discectomy with Metrix retractor operating microscope L4-L5 left Surgeon: Kristeen Miss Anesthesia: General endotracheal Indications: Kenneth [name] is a 65 year old individuals had a left lumbar radiculopathy he has an extraforaminal disc herniation that appears somewhat chronic and he was advised regarding surgical decompression.  Procedure: Patient was brought to the operating room supine on the stretcher.  After the smooth induction of general tracheal anesthesia he was turned prone.  The back was prepped with alcohol DuraPrep and draped in a sterile fashion.  Fluoroscopic guidance was used to localize L4-L5 on the left side in the extraforaminal zone.  The skin was marked and infiltrated with 10 cc of lidocaine with epinephrine mixed 50-50 with half percent Marcaine.  A K wire was passed to the external portion laminar arch at the L4-L5 space.  Then a small incision was created over this region and a series of dilators were used to pass an 18 x 4 Metrix cannula to this region.  The cannula was then attached to the operating table with a clamp.  Operating microscope was draped and brought into the field and monopolar cautery was used to clear soft tissue then a drill was used to clear the outside portion of the interlaminar space and hypertrophied facet.  The lateral portion of the lamina was then dissected down until the anterior transverse space was identified and the L4 nerve root was identified care was taken as the nerve appeared to be swollen and pushed dorsally it was carefully dissected both medially and laterally inferiorly and superiorly and out laterally to where the disc herniation was apparent.  By dissecting underneath it were able to find a rather firm mass.  When this was incised some fragments of disc could be  removed but none were singularly free.  The decompression then was performed in a very piecemeal fashion removing small fragments of disc from this hard mass.  In the end by going from above and below we are able to remove sufficient soft tissue to allow good decompression of the extraforaminal portion of the L4 nerve root.  Once this was accomplished hemostasis was achieved carefully and then half percent Marcaine was infiltrated around the path of the extraforaminal zone of the L4 nerve root.  10 cc of half percent Marcaine was injected into the paraspinous fascia and tissues.  The retractor was then removed and then the fascia was closed with 2-0 Vicryl in interrupted fashion 3-0 Vicryl was used in the subcuticular skin.  Dermabond was placed on the skin blood loss is estimated at 75 cc.

## 2020-09-30 NOTE — Progress Notes (Signed)
Patient was cleared for discharge to home. Discharge instructions given and he verbalized understanding without any further questions. He is in no acute pain and discomfort and is in good spirit to go home. He was informed to give his doctor a call if any issues rise up concerning his incision and to set up a follow up appointment.

## 2020-10-01 ENCOUNTER — Encounter (HOSPITAL_COMMUNITY): Payer: Self-pay | Admitting: Neurological Surgery

## 2020-10-01 NOTE — Anesthesia Postprocedure Evaluation (Signed)
Anesthesia Post Note  Patient: Kenneth [name]  Procedure(s) Performed: Left Lumbar Four-Five Extraforaminal microdiscectomy (Left Spine Lumbar)     Patient location during evaluation: PACU Anesthesia Type: General Level of consciousness: awake and alert Pain management: pain level controlled Vital Signs Assessment: post-procedure vital signs reviewed and stable Respiratory status: spontaneous breathing, nonlabored ventilation, respiratory function stable and patient connected to nasal cannula oxygen Cardiovascular status: blood pressure returned to baseline and stable Postop Assessment: no apparent nausea or vomiting Anesthetic complications: no   No complications documented.  Last Vitals:  Vitals:   09/30/20 1715 09/30/20 1744  BP: 98/75 111/90  Pulse: 72 80  Resp: 12 17  Temp: 36.9 C 36.4 C  SpO2: 96% (!) 88%    Last Pain:  Vitals:   09/30/20 1852  TempSrc:   PainSc: 5                  Jade Burright S

## 2020-10-04 ENCOUNTER — Telehealth: Payer: Self-pay

## 2020-10-04 NOTE — Telephone Encounter (Signed)
Transition Care Management Unsuccessful Follow-up Telephone Call  Date of discharge and from where:  09/30/2020, [facility]  Attempts:  1st Attempt  Reason for unsuccessful TCM follow-up call:  Left voice message - call placed to # [phone], call back requested to this CM.  Need to discuss scheduling a follow up appointment with PCP

## 2020-10-05 ENCOUNTER — Telehealth: Payer: Self-pay

## 2020-10-05 NOTE — Telephone Encounter (Signed)
Transition Care Management Unsuccessful Follow-up Telephone Call  Date of discharge and from where:  09/30/2020, [facility]   Attempts:  2nd Attempt  Reason for unsuccessful TCM follow-up call:  Left voice message . Call placed to # [phone]. Call back requested to this CM.  Need to discuss scheduling a follow up appointment with PCP

## 2020-10-05 NOTE — Telephone Encounter (Signed)
Letter sent to patient requesting he contact [facility] to schedule a follow up appointment as we have not been able to reach him

## 2020-10-20 ENCOUNTER — Other Ambulatory Visit: Payer: Self-pay | Admitting: Neurological Surgery

## 2020-11-02 ENCOUNTER — Telehealth (INDEPENDENT_AMBULATORY_CARE_PROVIDER_SITE_OTHER): Payer: Medicare HMO | Admitting: Primary Care

## 2020-11-16 ENCOUNTER — Other Ambulatory Visit (HOSPITAL_COMMUNITY): Payer: Medicare HMO

## 2020-11-16 ENCOUNTER — Other Ambulatory Visit (HOSPITAL_COMMUNITY)
Admission: RE | Admit: 2020-11-16 | Discharge: 2020-11-16 | Disposition: A | Discharge status: Home / Self Care | Payer: Medicare HMO | Source: Ambulatory Visit | Attending: Neurological Surgery | Admitting: Neurological Surgery

## 2020-11-16 DIAGNOSIS — Z20822 Contact with and (suspected) exposure to covid-19: Secondary | ICD-10-CM | POA: Insufficient documentation

## 2020-11-16 DIAGNOSIS — Z01812 Encounter for preprocedural laboratory examination: Secondary | ICD-10-CM | POA: Insufficient documentation

## 2020-11-16 LAB — SARS CORONAVIRUS 2 (TAT 6-24 HRS): SARS Coronavirus 2: NEGATIVE

## 2020-11-17 ENCOUNTER — Encounter (HOSPITAL_COMMUNITY): Payer: Self-pay | Admitting: Neurological Surgery

## 2020-11-17 ENCOUNTER — Other Ambulatory Visit: Payer: Self-pay

## 2020-11-17 NOTE — Progress Notes (Signed)
PCP - Juluis Mire, NP Cardiologist - pt denies  Chest x-ray -  EKG - 06/15/20 Stress Test - denies ECHO - 06/17/20 Cardiac Cath - deneis   COVID TEST- negative 11/16/20  Anesthesia review: yes  -------------  SDW INSTRUCTIONS:  Your procedure is scheduled on 11/18/20. Please report to [facility] Main Entrance "A" at 09:30 A.M., and check in at the Admitting office. Call this number if you have problems the morning of surgery: [phone]   Remember: Do not eat or drink after midnight the night before your surgery  Medications to take morning of surgery with a sip of water include: amLODipine (NORVASC)  carvedilol (COREG)  gabapentin (NEURONTIN) elvitegravir-cobicistat-emtricitabine-tenofovir (GENVOYA)   If needed: acetaminophen (TYLENOL)  diazepam (VALIUM) methocarbamol (ROBAXIN) oxyCODONE-acetaminophen (PERCOCET/ROXICET)  VENTOLIN HFA 108 (90 Base)   As of today, STOP taking any Aspirin (unless otherwise instructed by your surgeon), Aleve, Naproxen, Ibuprofen, Motrin, Advil, Goody's, BC's, all herbal medications, fish oil, and all vitamins.    The Morning of Surgery Do not wear jewelry Do not wear lotions, powders, colognes, or deodorant   Men may shave face and neck. Do not bring valuables to the hospital. [facility] is not responsible for any belongings or valuables. If you are a smoker, DO NOT Smoke 24 hours prior to surgery If you wear a CPAP at night please bring your mask the morning of surgery  Remember that you must have someone to transport you home after your surgery, and remain with you for 24 hours if you are discharged the same day. Please bring cases for contacts, glasses, hearing aids, dentures or bridgework because it cannot be worn into surgery.   Patients discharged the day of surgery will not be allowed to drive home.   Please shower the NIGHT BEFORE SURGERY and the MORNING OF SURGERY with DIAL Soap. Wear comfortable clothes the morning of  surgery. Oral Hygiene is also important to reduce your risk of infection.  Remember - BRUSH YOUR TEETH THE MORNING OF SURGERY WITH YOUR REGULAR TOOTHPASTE  Patient denies shortness of breath, fever, cough and chest pain.

## 2020-11-18 ENCOUNTER — Inpatient Hospital Stay (HOSPITAL_COMMUNITY): Payer: Medicare HMO | Admitting: Physician Assistant

## 2020-11-18 ENCOUNTER — Encounter (HOSPITAL_COMMUNITY)
Admission: RE | Disposition: A | Discharge status: Home / Self Care | Payer: Self-pay | Source: Home / Self Care | Attending: Neurological Surgery

## 2020-11-18 ENCOUNTER — Encounter (HOSPITAL_COMMUNITY): Payer: Self-pay | Admitting: Neurological Surgery

## 2020-11-18 ENCOUNTER — Inpatient Hospital Stay (HOSPITAL_COMMUNITY): Payer: Medicare HMO

## 2020-11-18 ENCOUNTER — Inpatient Hospital Stay (HOSPITAL_COMMUNITY)
Admission: RE | Admit: 2020-11-18 | Discharge: 2020-11-19 | DRG: 517 | Disposition: A | Discharge status: Home / Self Care | Payer: Medicare HMO | Attending: Neurological Surgery | Admitting: Neurological Surgery

## 2020-11-18 DIAGNOSIS — Z885 Allergy status to narcotic agent status: Secondary | ICD-10-CM

## 2020-11-18 DIAGNOSIS — E782 Mixed hyperlipidemia: Secondary | ICD-10-CM | POA: Diagnosis present

## 2020-11-18 DIAGNOSIS — Z87892 Personal history of anaphylaxis: Secondary | ICD-10-CM

## 2020-11-18 DIAGNOSIS — Z87891 Personal history of nicotine dependence: Secondary | ICD-10-CM | POA: Diagnosis not present

## 2020-11-18 DIAGNOSIS — M5116 Intervertebral disc disorders with radiculopathy, lumbar region: Secondary | ICD-10-CM | POA: Diagnosis present

## 2020-11-18 DIAGNOSIS — M4316 Spondylolisthesis, lumbar region: Secondary | ICD-10-CM | POA: Diagnosis present

## 2020-11-18 DIAGNOSIS — Z888 Allergy status to other drugs, medicaments and biological substances status: Secondary | ICD-10-CM

## 2020-11-18 DIAGNOSIS — Z20822 Contact with and (suspected) exposure to covid-19: Secondary | ICD-10-CM | POA: Diagnosis present

## 2020-11-18 DIAGNOSIS — Z8661 Personal history of infections of the central nervous system: Secondary | ICD-10-CM | POA: Diagnosis not present

## 2020-11-18 DIAGNOSIS — M7138 Other bursal cyst, other site: Secondary | ICD-10-CM | POA: Diagnosis present

## 2020-11-18 DIAGNOSIS — Z21 Asymptomatic human immunodeficiency virus [HIV] infection status: Secondary | ICD-10-CM | POA: Diagnosis present

## 2020-11-18 DIAGNOSIS — J439 Emphysema, unspecified: Secondary | ICD-10-CM | POA: Diagnosis present

## 2020-11-18 DIAGNOSIS — M4726 Other spondylosis with radiculopathy, lumbar region: Secondary | ICD-10-CM | POA: Diagnosis present

## 2020-11-18 DIAGNOSIS — M5416 Radiculopathy, lumbar region: Secondary | ICD-10-CM | POA: Diagnosis present

## 2020-11-18 DIAGNOSIS — I2510 Atherosclerotic heart disease of native coronary artery without angina pectoris: Secondary | ICD-10-CM | POA: Diagnosis present

## 2020-11-18 DIAGNOSIS — Z91030 Bee allergy status: Secondary | ICD-10-CM | POA: Diagnosis not present

## 2020-11-18 DIAGNOSIS — R7303 Prediabetes: Secondary | ICD-10-CM | POA: Diagnosis present

## 2020-11-18 DIAGNOSIS — Z419 Encounter for procedure for purposes other than remedying health state, unspecified: Secondary | ICD-10-CM

## 2020-11-18 DIAGNOSIS — M48061 Spinal stenosis, lumbar region without neurogenic claudication: Secondary | ICD-10-CM | POA: Diagnosis present

## 2020-11-18 DIAGNOSIS — I110 Hypertensive heart disease with heart failure: Secondary | ICD-10-CM | POA: Diagnosis present

## 2020-11-18 DIAGNOSIS — Z8249 Family history of ischemic heart disease and other diseases of the circulatory system: Secondary | ICD-10-CM

## 2020-11-18 DIAGNOSIS — G609 Hereditary and idiopathic neuropathy, unspecified: Secondary | ICD-10-CM | POA: Diagnosis present

## 2020-11-18 DIAGNOSIS — I509 Heart failure, unspecified: Secondary | ICD-10-CM | POA: Diagnosis present

## 2020-11-18 LAB — CBC
HCT: 39.4 % (ref 39.0–52.0)
Hemoglobin: 12.9 g/dL — ABNORMAL LOW (ref 13.0–17.0)
MCH: 28 pg (ref 26.0–34.0)
MCHC: 32.7 g/dL (ref 30.0–36.0)
MCV: 85.7 fL (ref 80.0–100.0)
Platelets: 271 10*3/uL (ref 150–400)
RBC: 4.6 MIL/uL (ref 4.22–5.81)
RDW: 16.9 % — ABNORMAL HIGH (ref 11.5–15.5)
WBC: 6.5 10*3/uL (ref 4.0–10.5)
nRBC: 0 % (ref 0.0–0.2)

## 2020-11-18 LAB — TYPE AND SCREEN
ABO/RH(D): B POS
Antibody Screen: NEGATIVE

## 2020-11-18 LAB — COMPREHENSIVE METABOLIC PANEL
ALT: 10 U/L (ref 0–44)
AST: 13 U/L — ABNORMAL LOW (ref 15–41)
Albumin: 3.3 g/dL — ABNORMAL LOW (ref 3.5–5.0)
Alkaline Phosphatase: 83 U/L (ref 38–126)
Anion gap: 9 (ref 5–15)
BUN: 11 mg/dL (ref 8–23)
CO2: 26 mmol/L (ref 22–32)
Calcium: 9.2 mg/dL (ref 8.9–10.3)
Chloride: 103 mmol/L (ref 98–111)
Creatinine, Ser: 0.79 mg/dL (ref 0.61–1.24)
GFR, Estimated: >60 mL/min (ref >60)
Glucose, Bld: 89 mg/dL (ref 70–99)
Potassium: 3.5 mmol/L (ref 3.5–5.1)
Sodium: 138 mmol/L (ref 135–145)
Total Bilirubin: 0.7 mg/dL (ref 0.3–1.2)
Total Protein: 7.3 g/dL (ref 6.5–8.1)

## 2020-11-18 LAB — ABO/RH: ABO/RH(D): B POS

## 2020-11-18 LAB — GLUCOSE, CAPILLARY: Glucose-Capillary: 70 mg/dL (ref 70–99)

## 2020-11-18 LAB — SURGICAL PCR SCREEN
MRSA, PCR: NEGATIVE
Staphylococcus aureus: POSITIVE — AB

## 2020-11-18 SURGERY — POSTERIOR LUMBAR FUSION 1 LEVEL
Anesthesia: General | Site: Back

## 2020-11-18 MED ORDER — SACUBITRIL-VALSARTAN 49-51 MG PO TABS
1.0000 | ORAL_TABLET | Freq: Two times a day (BID) | ORAL | Status: DC
  Administered 2020-11-18 – 2020-11-19 (×2): 1 via ORAL
  Filled 2020-11-18 (×2): qty 1

## 2020-11-18 MED ORDER — DEXAMETHASONE SODIUM PHOSPHATE 10 MG/ML IJ SOLN
INTRAMUSCULAR | Status: AC
  Filled 2020-11-18: qty 1

## 2020-11-18 MED ORDER — DIAZEPAM 5 MG PO TABS: 5.0000 mg | ORAL_TABLET | Freq: Four times a day (QID) | ORAL | Status: DC | PRN

## 2020-11-18 MED ORDER — GABAPENTIN 100 MG PO CAPS
100.0000 mg | ORAL_CAPSULE | Freq: Three times a day (TID) | ORAL | Status: DC
  Administered 2020-11-18 – 2020-11-19 (×2): 100 mg via ORAL
  Filled 2020-11-18 (×2): qty 1

## 2020-11-18 MED ORDER — PROPOFOL 10 MG/ML IV BOLUS
INTRAVENOUS | Status: DC | PRN
  Administered 2020-11-18 (×2): 40 mg via INTRAVENOUS

## 2020-11-18 MED ORDER — SENNA 8.6 MG PO TABS
1.0000 | ORAL_TABLET | Freq: Two times a day (BID) | ORAL | Status: DC
  Administered 2020-11-18 – 2020-11-19 (×2): 8.6 mg via ORAL
  Filled 2020-11-18 (×2): qty 1

## 2020-11-18 MED ORDER — ROCURONIUM BROMIDE 10 MG/ML (PF) SYRINGE
PREFILLED_SYRINGE | INTRAVENOUS | Status: DC | PRN
  Administered 2020-11-18: 10 mg via INTRAVENOUS
  Administered 2020-11-18: 70 mg via INTRAVENOUS
  Administered 2020-11-18: 10 mg via INTRAVENOUS

## 2020-11-18 MED ORDER — HYDRALAZINE HCL 20 MG/ML IJ SOLN
10.0000 mg | Freq: Once | INTRAMUSCULAR | Status: AC
  Administered 2020-11-18: 10 mg via INTRAVENOUS

## 2020-11-18 MED ORDER — 0.9 % SODIUM CHLORIDE (POUR BTL) OPTIME
TOPICAL | Status: DC | PRN
  Administered 2020-11-18: 1000 mL

## 2020-11-18 MED ORDER — ACETAMINOPHEN 160 MG/5ML PO SOLN: 1000.0000 mg | Freq: Once | ORAL | Status: DC | PRN

## 2020-11-18 MED ORDER — BUPIVACAINE HCL (PF) 0.5 % IJ SOLN
INTRAMUSCULAR | Status: DC | PRN
  Administered 2020-11-18: 25 mL
  Administered 2020-11-18: 2.5 mL

## 2020-11-18 MED ORDER — ACETAMINOPHEN 10 MG/ML IV SOLN
1000.0000 mg | Freq: Once | INTRAVENOUS | Status: DC | PRN
  Administered 2020-11-18: 1000 mg via INTRAVENOUS

## 2020-11-18 MED ORDER — CHLORHEXIDINE GLUCONATE 0.12 % MT SOLN
15.0000 mL | Freq: Once | OROMUCOSAL | Status: AC
  Administered 2020-11-18: 15 mL via OROMUCOSAL
  Filled 2020-11-18: qty 15

## 2020-11-18 MED ORDER — THROMBIN 5000 UNITS EX SOLR
OROMUCOSAL | Status: DC | PRN
  Administered 2020-11-18: 5 mL via TOPICAL

## 2020-11-18 MED ORDER — ACETAMINOPHEN 325 MG PO TABS: 650.0000 mg | ORAL_TABLET | ORAL | Status: DC | PRN

## 2020-11-18 MED ORDER — FENTANYL CITRATE (PF) 250 MCG/5ML IJ SOLN
INTRAMUSCULAR | Status: DC | PRN
  Administered 2020-11-18: 100 ug via INTRAVENOUS
  Administered 2020-11-18 (×3): 50 ug via INTRAVENOUS

## 2020-11-18 MED ORDER — MIDAZOLAM HCL 2 MG/2ML IJ SOLN
INTRAMUSCULAR | Status: AC
  Filled 2020-11-18: qty 2

## 2020-11-18 MED ORDER — OXYCODONE-ACETAMINOPHEN 5-325 MG PO TABS
1.0000 | ORAL_TABLET | Freq: Four times a day (QID) | ORAL | Status: DC | PRN
  Administered 2020-11-18 – 2020-11-19 (×3): 2 via ORAL
  Filled 2020-11-18 (×3): qty 2

## 2020-11-18 MED ORDER — ESMOLOL HCL 100 MG/10ML IV SOLN
INTRAVENOUS | Status: DC | PRN
  Administered 2020-11-18: 20 mg via INTRAVENOUS

## 2020-11-18 MED ORDER — MENTHOL 3 MG MT LOZG: 1.0000 | LOZENGE | OROMUCOSAL | Status: DC | PRN

## 2020-11-18 MED ORDER — BISACODYL 10 MG RE SUPP: 10.0000 mg | Freq: Every day | RECTAL | Status: DC | PRN

## 2020-11-18 MED ORDER — ALUM & MAG HYDROXIDE-SIMETH 200-200-20 MG/5ML PO SUSP: 30.0000 mL | Freq: Four times a day (QID) | ORAL | Status: DC | PRN

## 2020-11-18 MED ORDER — LACTATED RINGERS IV SOLN: INTRAVENOUS | Status: DC

## 2020-11-18 MED ORDER — ONDANSETRON HCL 4 MG/2ML IJ SOLN
INTRAMUSCULAR | Status: AC
  Filled 2020-11-18: qty 2

## 2020-11-18 MED ORDER — SUGAMMADEX SODIUM 200 MG/2ML IV SOLN
INTRAVENOUS | Status: DC | PRN
  Administered 2020-11-18: 200 mg via INTRAVENOUS

## 2020-11-18 MED ORDER — LIDOCAINE-EPINEPHRINE 1 %-1:100000 IJ SOLN
INTRAMUSCULAR | Status: AC
  Filled 2020-11-18: qty 1

## 2020-11-18 MED ORDER — FUROSEMIDE 20 MG PO TABS
20.0000 mg | ORAL_TABLET | Freq: Every day | ORAL | Status: DC
  Administered 2020-11-18 – 2020-11-19 (×2): 20 mg via ORAL
  Filled 2020-11-18 (×2): qty 1

## 2020-11-18 MED ORDER — ALBUMIN HUMAN 5 % IV SOLN: INTRAVENOUS | Status: DC | PRN

## 2020-11-18 MED ORDER — FENTANYL CITRATE (PF) 250 MCG/5ML IJ SOLN
INTRAMUSCULAR | Status: AC
  Filled 2020-11-18: qty 5

## 2020-11-18 MED ORDER — ROCURONIUM BROMIDE 10 MG/ML (PF) SYRINGE
PREFILLED_SYRINGE | INTRAVENOUS | Status: AC
  Filled 2020-11-18: qty 10

## 2020-11-18 MED ORDER — CHLORHEXIDINE GLUCONATE CLOTH 2 % EX PADS: 6.0000 | MEDICATED_PAD | Freq: Once | CUTANEOUS | Status: DC

## 2020-11-18 MED ORDER — OXYCODONE HCL 5 MG PO TABS
5.0000 mg | ORAL_TABLET | Freq: Once | ORAL | Status: AC | PRN
  Administered 2020-11-18: 5 mg via ORAL

## 2020-11-18 MED ORDER — LIDOCAINE 2% (20 MG/ML) 5 ML SYRINGE
INTRAMUSCULAR | Status: DC | PRN
  Administered 2020-11-18: 60 mg via INTRAVENOUS

## 2020-11-18 MED ORDER — ACETAMINOPHEN 10 MG/ML IV SOLN
INTRAVENOUS | Status: AC
  Filled 2020-11-18: qty 100

## 2020-11-18 MED ORDER — ACETAMINOPHEN 500 MG PO TABS: 1000.0000 mg | ORAL_TABLET | Freq: Four times a day (QID) | ORAL | Status: DC | PRN

## 2020-11-18 MED ORDER — FENTANYL CITRATE (PF) 100 MCG/2ML IJ SOLN
25.0000 ug | INTRAMUSCULAR | Status: DC | PRN
  Administered 2020-11-18 (×2): 50 ug via INTRAVENOUS

## 2020-11-18 MED ORDER — POLYETHYLENE GLYCOL 3350 17 G PO PACK: 17.0000 g | PACK | Freq: Every day | ORAL | Status: DC | PRN

## 2020-11-18 MED ORDER — ACETAMINOPHEN 650 MG RE SUPP: 650.0000 mg | RECTAL | Status: DC | PRN

## 2020-11-18 MED ORDER — LIDOCAINE 2% (20 MG/ML) 5 ML SYRINGE
INTRAMUSCULAR | Status: AC
  Filled 2020-11-18: qty 5

## 2020-11-18 MED ORDER — DEXAMETHASONE SODIUM PHOSPHATE 10 MG/ML IJ SOLN
INTRAMUSCULAR | Status: DC | PRN
  Administered 2020-11-18: 10 mg via INTRAVENOUS

## 2020-11-18 MED ORDER — ONDANSETRON HCL 4 MG/2ML IJ SOLN: 4.0000 mg | Freq: Four times a day (QID) | INTRAMUSCULAR | Status: DC | PRN

## 2020-11-18 MED ORDER — LIDOCAINE-EPINEPHRINE 1 %-1:100000 IJ SOLN
INTRAMUSCULAR | Status: DC | PRN
  Administered 2020-11-18: 2.5 mL

## 2020-11-18 MED ORDER — DOCUSATE SODIUM 100 MG PO CAPS
100.0000 mg | ORAL_CAPSULE | Freq: Two times a day (BID) | ORAL | Status: DC
  Administered 2020-11-18 – 2020-11-19 (×2): 100 mg via ORAL
  Filled 2020-11-18 (×2): qty 1

## 2020-11-18 MED ORDER — ESMOLOL HCL 100 MG/10ML IV SOLN
INTRAVENOUS | Status: AC
  Filled 2020-11-18: qty 10

## 2020-11-18 MED ORDER — DIAZEPAM 5 MG PO TABS
5.0000 mg | ORAL_TABLET | Freq: Four times a day (QID) | ORAL | Status: DC | PRN
  Administered 2020-11-18 – 2020-11-19 (×2): 5 mg via ORAL
  Filled 2020-11-18 (×2): qty 1

## 2020-11-18 MED ORDER — ONDANSETRON HCL 4 MG/2ML IJ SOLN
INTRAMUSCULAR | Status: DC | PRN
  Administered 2020-11-18: 4 mg via INTRAVENOUS

## 2020-11-18 MED ORDER — ALBUTEROL SULFATE HFA 108 (90 BASE) MCG/ACT IN AERS: 2.0000 | INHALATION_SPRAY | Freq: Four times a day (QID) | RESPIRATORY_TRACT | Status: DC | PRN

## 2020-11-18 MED ORDER — CARVEDILOL 3.125 MG PO TABS
6.2500 mg | ORAL_TABLET | Freq: Once | ORAL | Status: AC
  Administered 2020-11-18: 6.25 mg via ORAL
  Filled 2020-11-18: qty 2

## 2020-11-18 MED ORDER — ONDANSETRON HCL 4 MG PO TABS: 4.0000 mg | ORAL_TABLET | Freq: Four times a day (QID) | ORAL | Status: DC | PRN

## 2020-11-18 MED ORDER — CARVEDILOL 6.25 MG PO TABS
6.2500 mg | ORAL_TABLET | Freq: Two times a day (BID) | ORAL | Status: DC
  Administered 2020-11-18 – 2020-11-19 (×2): 6.25 mg via ORAL
  Filled 2020-11-18 (×2): qty 1

## 2020-11-18 MED ORDER — BUPIVACAINE HCL (PF) 0.5 % IJ SOLN
INTRAMUSCULAR | Status: AC
  Filled 2020-11-18: qty 30

## 2020-11-18 MED ORDER — SODIUM CHLORIDE 0.9% FLUSH: 3.0000 mL | INTRAVENOUS | Status: DC | PRN

## 2020-11-18 MED ORDER — OXYCODONE HCL 5 MG PO TABS
ORAL_TABLET | ORAL | Status: AC
  Filled 2020-11-18: qty 1

## 2020-11-18 MED ORDER — THROMBIN 20000 UNITS EX SOLR
CUTANEOUS | Status: AC
  Filled 2020-11-18: qty 20000

## 2020-11-18 MED ORDER — FENTANYL CITRATE (PF) 100 MCG/2ML IJ SOLN
INTRAMUSCULAR | Status: AC
  Filled 2020-11-18: qty 2

## 2020-11-18 MED ORDER — THROMBIN 5000 UNITS EX KIT
PACK | CUTANEOUS | Status: AC
  Filled 2020-11-18: qty 1

## 2020-11-18 MED ORDER — PROPOFOL 10 MG/ML IV BOLUS
INTRAVENOUS | Status: AC
  Filled 2020-11-18: qty 20

## 2020-11-18 MED ORDER — HYDRALAZINE HCL 20 MG/ML IJ SOLN
INTRAMUSCULAR | Status: AC
  Filled 2020-11-18: qty 1

## 2020-11-18 MED ORDER — OXYCODONE HCL 5 MG/5ML PO SOLN: 5.0000 mg | Freq: Once | ORAL | Status: AC | PRN

## 2020-11-18 MED ORDER — FLEET ENEMA 7-19 GM/118ML RE ENEM: 1.0000 | ENEMA | Freq: Once | RECTAL | Status: DC | PRN

## 2020-11-18 MED ORDER — ELVITEG-COBIC-EMTRICIT-TENOFAF 150-150-200-10 MG PO TABS
1.0000 | ORAL_TABLET | Freq: Every day | ORAL | Status: DC
  Administered 2020-11-19: 1 via ORAL
  Filled 2020-11-18: qty 1

## 2020-11-18 MED ORDER — PHENOL 1.4 % MT LIQD: 1.0000 | OROMUCOSAL | Status: DC | PRN

## 2020-11-18 MED ORDER — AMLODIPINE BESYLATE 5 MG PO TABS
10.0000 mg | ORAL_TABLET | Freq: Every day | ORAL | Status: DC
  Administered 2020-11-19: 10 mg via ORAL
  Filled 2020-11-18: qty 2

## 2020-11-18 MED ORDER — CEFAZOLIN SODIUM-DEXTROSE 2-4 GM/100ML-% IV SOLN
2.0000 g | INTRAVENOUS | Status: AC
  Administered 2020-11-18: 2 g via INTRAVENOUS
  Filled 2020-11-18: qty 100

## 2020-11-18 MED ORDER — HYDROMORPHONE HCL 1 MG/ML IJ SOLN
1.0000 mg | INTRAMUSCULAR | Status: DC | PRN
  Administered 2020-11-18: 1 mg via INTRAVENOUS
  Filled 2020-11-18: qty 1

## 2020-11-18 MED ORDER — SODIUM CHLORIDE 0.9 % IV SOLN: 250.0000 mL | INTRAVENOUS | Status: DC

## 2020-11-18 MED ORDER — PHENYLEPHRINE HCL-NACL 10-0.9 MG/250ML-% IV SOLN
INTRAVENOUS | Status: DC | PRN
  Administered 2020-11-18: 40 ug/min via INTRAVENOUS

## 2020-11-18 MED ORDER — LABETALOL HCL 5 MG/ML IV SOLN
INTRAVENOUS | Status: DC | PRN
  Administered 2020-11-18 (×2): 2.5 mg via INTRAVENOUS

## 2020-11-18 MED ORDER — SODIUM CHLORIDE 0.9% FLUSH: 3.0000 mL | Freq: Two times a day (BID) | INTRAVENOUS | Status: DC

## 2020-11-18 MED ORDER — ORAL CARE MOUTH RINSE: 15.0000 mL | Freq: Once | OROMUCOSAL | Status: AC

## 2020-11-18 MED ORDER — PHENYLEPHRINE 40 MCG/ML (10ML) SYRINGE FOR IV PUSH (FOR BLOOD PRESSURE SUPPORT)
PREFILLED_SYRINGE | INTRAVENOUS | Status: DC | PRN
  Administered 2020-11-18: 80 ug via INTRAVENOUS
  Administered 2020-11-18: 40 ug via INTRAVENOUS

## 2020-11-18 MED ORDER — ACETAMINOPHEN 500 MG PO TABS: 1000.0000 mg | ORAL_TABLET | Freq: Once | ORAL | Status: DC | PRN

## 2020-11-18 MED ORDER — CEFAZOLIN SODIUM-DEXTROSE 2-4 GM/100ML-% IV SOLN
2.0000 g | Freq: Three times a day (TID) | INTRAVENOUS | Status: AC
  Administered 2020-11-18 – 2020-11-19 (×2): 2 g via INTRAVENOUS
  Filled 2020-11-18 (×2): qty 100

## 2020-11-18 SURGICAL SUPPLY — 55 items
BASKET BONE COLLECTION (BASKET) ×2 IMPLANT
BLADE CLIPPER SURG (BLADE) IMPLANT
BUR MATCHSTICK NEURO 3.0 LAGG (BURR) ×2 IMPLANT
CANISTER SUCT 3000ML PPV (MISCELLANEOUS) ×2 IMPLANT
CNTNR URN SCR LID CUP LEK RST (MISCELLANEOUS) ×1 IMPLANT
CONT SPEC 4OZ STRL OR WHT (MISCELLANEOUS) ×1
COVER BACK TABLE 60X90IN (DRAPES) ×2 IMPLANT
COVER WAND RF STERILE (DRAPES) IMPLANT
DECANTER SPIKE VIAL GLASS SM (MISCELLANEOUS) ×2 IMPLANT
DERMABOND ADVANCED (GAUZE/BANDAGES/DRESSINGS) ×1
DERMABOND ADVANCED .7 DNX12 (GAUZE/BANDAGES/DRESSINGS) ×1 IMPLANT
DEVICE DISSECT PLASMABLAD 3.0S (MISCELLANEOUS) ×1 IMPLANT
DRAPE C-ARM 42X72 X-RAY (DRAPES) ×4 IMPLANT
DRAPE HALF SHEET 40X57 (DRAPES) IMPLANT
DRAPE LAPAROTOMY 100X72X124 (DRAPES) ×2 IMPLANT
DRSG OPSITE POSTOP 4X6 (GAUZE/BANDAGES/DRESSINGS) ×2 IMPLANT
DURAPREP 26ML APPLICATOR (WOUND CARE) ×2 IMPLANT
DURASEAL APPLICATOR TIP (TIP) IMPLANT
DURASEAL SPINE SEALANT 3ML (MISCELLANEOUS) IMPLANT
ELECT REM PT RETURN 9FT ADLT (ELECTROSURGICAL) ×2
ELECTRODE REM PT RTRN 9FT ADLT (ELECTROSURGICAL) ×1 IMPLANT
GAUZE 4X4 16PLY RFD (DISPOSABLE) IMPLANT
GAUZE SPONGE 4X4 12PLY STRL (GAUZE/BANDAGES/DRESSINGS) ×2 IMPLANT
GLOVE BIOGEL PI IND STRL 8.5 (GLOVE) ×2 IMPLANT
GLOVE BIOGEL PI INDICATOR 8.5 (GLOVE) ×2
GLOVE ECLIPSE 8.5 STRL (GLOVE) ×4 IMPLANT
GLOVE SURG UNDER POLY LF SZ7.5 (GLOVE) ×2 IMPLANT
GOWN STRL REUS W/ TWL LRG LVL3 (GOWN DISPOSABLE) IMPLANT
GOWN STRL REUS W/ TWL XL LVL3 (GOWN DISPOSABLE) ×1 IMPLANT
GOWN STRL REUS W/TWL 2XL LVL3 (GOWN DISPOSABLE) ×4 IMPLANT
GOWN STRL REUS W/TWL LRG LVL3 (GOWN DISPOSABLE)
GOWN STRL REUS W/TWL XL LVL3 (GOWN DISPOSABLE) ×1
HEMOSTAT POWDER KIT SURGIFOAM (HEMOSTASIS) ×2 IMPLANT
KIT BASIN OR (CUSTOM PROCEDURE TRAY) ×2 IMPLANT
KIT TURNOVER KIT B (KITS) ×2 IMPLANT
MILL MEDIUM DISP (BLADE) ×2 IMPLANT
NEEDLE HYPO 22GX1.5 SAFETY (NEEDLE) ×2 IMPLANT
NS IRRIG 1000ML POUR BTL (IV SOLUTION) ×2 IMPLANT
PACK LAMINECTOMY NEURO (CUSTOM PROCEDURE TRAY) ×2 IMPLANT
PAD ARMBOARD 7.5X6 YLW CONV (MISCELLANEOUS) ×6 IMPLANT
PATTIES SURGICAL .5 X1 (DISPOSABLE) ×2 IMPLANT
PLASMABLADE 3.0S (MISCELLANEOUS) ×2
SPONGE LAP 4X18 RFD (DISPOSABLE) IMPLANT
SPONGE SURGIFOAM ABS GEL 100 (HEMOSTASIS) IMPLANT
SUT PROLENE 6 0 BV (SUTURE) IMPLANT
SUT VIC AB 1 CT1 18XBRD ANBCTR (SUTURE) ×1 IMPLANT
SUT VIC AB 1 CT1 8-18 (SUTURE) ×1
SUT VIC AB 2-0 CP2 18 (SUTURE) ×2 IMPLANT
SUT VIC AB 3-0 SH 8-18 (SUTURE) ×2 IMPLANT
SUT VIC AB 4-0 RB1 18 (SUTURE) ×2 IMPLANT
SYR 3ML LL SCALE MARK (SYRINGE) ×8 IMPLANT
TOWEL GREEN STERILE (TOWEL DISPOSABLE) ×2 IMPLANT
TOWEL GREEN STERILE FF (TOWEL DISPOSABLE) ×2 IMPLANT
TRAY FOLEY MTR SLVR 16FR STAT (SET/KITS/TRAYS/PACK) ×2 IMPLANT
WATER STERILE IRR 1000ML POUR (IV SOLUTION) ×2 IMPLANT

## 2020-11-18 NOTE — Anesthesia Preprocedure Evaluation (Addendum)
Anesthesia Evaluation  Patient identified by MRN, date of birth, ID band Patient awake    Reviewed: Allergy & Precautions, NPO status , Patient's Chart, lab work & pertinent test results, Unable to perform ROS - Chart review only  History of Anesthesia Complications Negative for: history of anesthetic complications  Airway Mallampati: II  TM Distance: >3 FB Neck ROM: Full    Dental  (+) Missing,    Pulmonary asthma , COPD, Current Smoker and Patient abstained from smoking.,  Covid-19 Nucleic Acid Test Results Lab Results      Component                Value               Date                      [place]              NEGATIVE            11/16/2020                [place]              NEGATIVE            09/28/2020                [place]              NEGATIVE            07/25/2019               + decreased breath sounds      Cardiovascular hypertension, Pt. on medications + CAD and +CHF   Rhythm:Regular  1. Left ventricular ejection fraction, by estimation, is 25 to 30%. The  left ventricle has severely decreased function. The left ventricle  demonstrates global hypokinesis. There is mild left ventricular  hypertrophy. Left ventricular diastolic parameters  are consistent with Grade I diastolic dysfunction (impaired relaxation).  2. Right ventricular systolic function is mildly reduced. The right  ventricular size is normal. There is normal pulmonary artery systolic  pressure. The estimated right ventricular systolic pressure is 40.1 mmHg.  3. Left atrial size was mildly dilated.  4. The mitral valve is normal in structure. Trivial mitral valve  regurgitation. No evidence of mitral stenosis.  5. The aortic valve is tricuspid. Aortic valve regurgitation is mild.  Mild aortic valve sclerosis is present, with no evidence of aortic valve  stenosis.  6. Aortic dilatation noted. There is mild dilatation of the  ascending  aorta, measuring 41 mm.  7. The inferior vena cava is normal in size with greater than 50%  respiratory variability, suggesting right atrial pressure of 3 mmHg.    Neuro/Psych neg Seizures  Neuromuscular disease negative psych ROS   GI/Hepatic Neg liver ROS, GERD  ,Lab Results      Component                Value               Date                      ALT                      10                  11/18/2020  AST                      13 (L)              11/18/2020                ALKPHOS                  83                  11/18/2020                BILITOT                  0.7                 11/18/2020              Endo/Other  negative endocrine ROSLab Results      Component                Value               Date                      HGBA1C                   5.6                 12/14/2012             Renal/GU Lab Results      Component                Value               Date                      CREATININE               0.79                11/18/2020           Lab Results      Component                Value               Date                      K                        3.5                 11/18/2020                Musculoskeletal  (+) Arthritis ,   Abdominal   Peds  Hematology  (+) anemia , HIV, Lab Results      Component                Value               Date                      WBC                      6.5  11/18/2020                HGB                      12.9 (L)            11/18/2020                HCT                      39.4                11/18/2020                MCV                      85.7                11/18/2020                PLT                      271                 11/18/2020              Anesthesia Other Findings   Reproductive/Obstetrics                            Anesthesia Physical Anesthesia Plan  ASA: III  Anesthesia Plan: General   Post-op Pain Management:     Induction: Intravenous  PONV Risk Score and Plan: 1 and Ondansetron and Dexamethasone  Airway Management Planned: Oral ETT  Additional Equipment: None  Intra-op Plan:   Post-operative Plan: Extubation in OR  Informed Consent: I have reviewed the patients History and Physical, chart, labs and discussed the procedure including the risks, benefits and alternatives for the proposed anesthesia with the patient or authorized representative who has indicated his/her understanding and acceptance.     Dental advisory given  Plan Discussed with: CRNA and Surgeon  Anesthesia Plan Comments:         Anesthesia Quick Evaluation

## 2020-11-18 NOTE — Interval H&P Note (Signed)
History and Physical Interval Note:  11/18/2020 12:29 PM  [name]  has presented today for surgery, with the diagnosis of Other intervertebral disc degeneration, Lumbar region.  The various methods of treatment have been discussed with the patient and family. After consideration of risks, benefits and other options for treatment, the patient has consented to  Procedure(s) with comments: Lumbar 4-5 Posterior lumbar interbody fusion with Left Lumbar 3-4 Laminotomy (N/A) - 3C/RM 20 as a surgical intervention.  The patient's history has been reviewed, patient examined, no change in status, stable for surgery.  I have reviewed the patient's chart and labs.  Questions were answered to the patient's satisfaction.     Kenneth [name]

## 2020-11-18 NOTE — Progress Notes (Signed)
Dr. Ermalene Postin made aware of BP--ordered home dose of Coreg

## 2020-11-18 NOTE — Anesthesia Procedure Notes (Signed)
Procedure Name: Intubation Date/Time: 11/18/2020 1:09 PM Performed by: Reece Agar, CRNA Pre-anesthesia Checklist: Patient identified, Emergency Drugs available, Suction available and Patient being monitored Patient Re-evaluated:Patient Re-evaluated prior to induction Oxygen Delivery Method: Circle System Utilized Preoxygenation: Pre-oxygenation with 100% oxygen Induction Type: IV induction Ventilation: Mask ventilation without difficulty Laryngoscope Size: Mac and 4 Grade View: Grade II Tube type: Oral Tube size: 7.5 mm Number of attempts: 1 Airway Equipment and Method: Stylet Placement Confirmation: ETT inserted through vocal cords under direct vision,  positive ETCO2 and breath sounds checked- equal and bilateral Secured at: 22 cm Tube secured with: Tape Dental Injury: Teeth and Oropharynx as per pre-operative assessment

## 2020-11-18 NOTE — Progress Notes (Signed)
Orthopedic Tech Progress Note Patient Details:  Michaeljoseph Revolorio Feb 26, 1956 [id number] Delivered LSO to pt bedside Patient ID: Ryman Rathgeber, male   DOB: 01/19/1956, 65 y.o.   MRN: [id number]   Tammy Sours 11/18/2020, 7:50 PM
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
## 2020-11-18 NOTE — Progress Notes (Signed)
Contacted Dr. Laurie Panda and notified of him of BP 192/121. 10mg  hydralazine verbally ordered and given. Brittiany, CRNA notified.

## 2020-11-18 NOTE — Transfer of Care (Signed)
Immediate Anesthesia Transfer of Care Note  Patient: Ngai Parcell  Procedure(s) Performed: Left Lumbar Four-Five, Lumbar Three-Four Laminotomy and foraminotomy, Decompression of Lumbar Four Nerve Root, Exploration of Arthrodesis (N/A Back)  Patient Location: PACU  Anesthesia Type:General  Level of Consciousness: awake, alert  and oriented  Airway & Oxygen Therapy: Patient Spontanous Breathing and Patient connected to face mask oxygen  Post-op Assessment: Report given to RN and Post -op Vital signs reviewed and stable  Post vital signs: Reviewed and stable  Last Vitals:  Vitals Value Taken Time  BP 148/103   Temp    Pulse 73   Resp 12   SpO2 100     Last Pain:  Vitals:   11/18/20 1054  TempSrc:   PainSc: 9       Patients Stated Pain Goal: 3 (03/75/43 6067)  Complications: No complications documented.

## 2020-11-18 NOTE — Op Note (Signed)
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  Status post previous laminectomy and extraforaminal expiration L4-5. Postoperative diagnosis: Same Procedure: Decompression of the L4 nerve root at the level of L4-L5 via laminotomy and foraminotomies laminotomy and foraminotomies L3-4 with decompression of the exiting L4 nerve root.  Exploration of arthrodesis. Surgeon: Kristeen Miss First Assistant: Duffy Rhody, MD Anesthesia: General endotracheal Indications: Kenneth [name] is a 65 year old male who a couple months ago underwent surgical decompression via the extraforaminal root at L4-L5 where there was extraforaminal disc herniation that appeared chronic in nature.  He has severe left L4 radiculopathy.  Improved very little and on a follow-up study was noted that his L4-5 disc was markedly degenerated.  There is no hint that there was an arthrodesis there already because of the amount of disc degeneration I advised that the patient should undergo posterior lumbar interbody arthrodesis and stabilization of L4-L5 he also had some lateral recess stenosis at L3-L4 on the left and this would be decompressed via laminotomy and foraminotomies.  At the time of surgery however it was noted that L4-L5 was already fused with posterior bridging bone that was substantial.  The disc space is in the degenerated but as the L4 nerve root was decompressed in its path out into the extraforaminal zone the fusion was maintained intact.  It was therefore decided after exploring the fusion noting that it was stout on both left and right sides to simply provide a decompression at L4-5 and not instrument the disc space or the pedicles.  L3-4 underwent decompression via laminotomy and foraminotomy with a chronic synovial cyst being removed that allowed for considerable decompression of the L4 nerve root most proximally.  Procedure:  The patient was brought to the operating room supine on the stretcher.  After the smooth induction of general tracheal anesthesia he was carefully turned prone.  The back was prepped with alcohol DuraPrep and draped in sterile fashion.  Patient had had previous extensive laminectomy and the thickened scar in the midline.  After placing local anesthetic in subcutaneous tissues vertical incision was created and the dissection was carried down to the lumbodorsal fascia fascia was opened on either side of midline and the L4-5 space was localized with a radiograph.  Dissection was then carried out on either side with thick scar being attached to the remnants of the spinous process at L for and L3.  Self-retaining retractor was placed in the wound and then a laminotomy was created first on the left side at L4-5 near the common dural tube and the path of the L4 nerve root was carefully uncovered.  A generous laminotomy was created in the extraforaminal zone was explored.  Superior articular process of L5 could not be differentiated from the facet at L4 and it was noted that these were solidly fused together.  Once an adequate decompression the L4 nerve root was completed assessment of the interlaminar space was performed and it was noted that there was no motion between the lamina of L4 and the L5 vertebrae.  This suggested the presence of a solid arthrodesis which was indeed the case.  I then explored L3-4 where we performed a laminotomy and foraminotomies using a high-speed drill and a 2 and a 3 mm Kerrison punch here there was a 6 significant synovial cyst that was very thickened and causing significant compression on the left side of the spinal canal at the takeoff of the  L4 nerve root this area was decompressed and the in the path of the common dural tube and the takeoff the L4 nerve root proximally and all the way out into the foramen was noted to be well decompressed hemostasis along the soft tissues was obtained  meticulously and when verified the arthrodesis was again checked and noted to be solid at the L4-5 level therefore did not place any instrumentation between L4 and L5 neural was an interbody arthrodesis performed.  The wound was carefully inspected for hemostasis and then after infiltrating with 20 cc of half percent Marcaine and the surrounding fascia and tissues the lumbodorsal fascia was closed with #1 Vicryl in interrupted fashion 2-0 Vicryl was used in subcutaneous tissues 3-0 Vicryl subcuticularly and a 4-0 Vicryl with a final subcuticular closure Dermabond was placed on the skin and blood loss was estimated 50 cc.

## 2020-11-19 MED ORDER — CEPHALEXIN 500 MG PO CAPS: 500.0000 mg | ORAL_CAPSULE | Freq: Three times a day (TID) | ORAL | 0 refills | Status: AC

## 2020-11-19 MED ORDER — OXYCODONE-ACETAMINOPHEN 5-325 MG PO TABS: 1.0000 | ORAL_TABLET | Freq: Four times a day (QID) | ORAL | 0 refills | Status: AC | PRN

## 2020-11-19 NOTE — Anesthesia Postprocedure Evaluation (Signed)
Anesthesia Post Note  Patient: Kenneth [name]  Procedure(s) Performed: Left Lumbar Four-Five, Lumbar Three-Four Laminotomy and foraminotomy, Decompression of Lumbar Four Nerve Root, Exploration of Arthrodesis (N/A Back)     Patient location during evaluation: PACU Anesthesia Type: General Level of consciousness: awake and alert Pain management: pain level controlled Vital Signs Assessment: post-procedure vital signs reviewed and stable Respiratory status: spontaneous breathing, nonlabored ventilation, respiratory function stable and patient connected to nasal cannula oxygen Cardiovascular status: blood pressure returned to baseline and stable Postop Assessment: no apparent nausea or vomiting Anesthetic complications: no   No complications documented.  Last Vitals:  Vitals:   11/19/20 0429 11/19/20 0716  BP: 129/89 112/68  Pulse: 64 69  Resp: 20 18  Temp: 36.7 C 36.6 C  SpO2: 95% 90%    Last Pain:  Vitals:   11/19/20 0931  TempSrc:   PainSc: 5                  Tiajuana Amass

## 2020-11-19 NOTE — Progress Notes (Addendum)
Patient is discharged from room 3C04 at this time. Alert and in stable condition. IV site d/c'd and instructions read to patient with understanding verbalized and all questions answered. Dressing changed. Left unit via wheelchair with all belongings at side.

## 2020-11-19 NOTE — Evaluation (Signed)
Physical Therapy Evaluation Patient Details Name: Kenneth [name] MRN: [id number] DOB: [birth year] Today's Date: 11/19/2020   History of Present Illness  Patient is a 65 y/o male who presents s/p Left L4-5 L3-4 lumbar radiculopathy with degenerative disc 11/18/20. PMH includes recent back surgery 09/2020, HTN, HIV positive, CAD, COPD, CHF, meningitis.  Clinical Impression  Patient presents with pain and post surgical deficits s/p above surgery. Pt lives with wife and reports being independent for ADLs and using SPC PTA. Today, pt tolerated bed mobility, transfers and gait training with supervision progressing to Mod I for safety. Tolerated stair training with supervision and use of rails for support. Education re: back precautions, log roll technique, car transfer, exercise recommendations, positioning, brace etc. Pt is functioning close to baseline and will have necessary support at home. Does not require further skilled therapy services. All education complete. Discharge from therapy.     No PT follow up    Equipment Recommendations  None recommended by PT    Recommendations for Other Services       Precautions / Restrictions Precautions Precautions: Back Precaution Booklet Issued: Yes (comment) Precaution Comments: Reviewed handout and precautions Required Braces or Orthoses: Spinal Brace Spinal Brace: Lumbar corset;Applied in standing position Restrictions Weight Bearing Restrictions: No      Mobility  Bed Mobility Overal bed mobility: Needs Assistance Bed Mobility: Sidelying to Sit   Sidelying to sit: Modified independent (Device/Increase time)       General bed mobility comments: HOB flat, no use of rails to simulate home. Good demo of log roll techinque,    Transfers Overall transfer level: Modified independent Equipment used: None             General transfer comment: Stood from EOB x1, no assist needed, slow to rise.  Ambulation/Gait Ambulation/Gait assistance:  Supervision;Modified independent (Device/Increase time) Gait Distance (Feet): 400 Feet Assistive device: None Gait Pattern/deviations: Step-through pattern;Decreased stride length;Antalgic Gait velocity: decreased Gait velocity interpretation: 1.31 - 2.62 ft/sec, indicative of limited community ambulator General Gait Details: Slow, mostly steady gait with mild antalgic like gait pattern on RLE. No evidence of imbalance.  Stairs Stairs: Yes Stairs assistance: Supervision Stair Management: One rail Left Number of Stairs: 13 General stair comments: Cues for technique.  Wheelchair Mobility    Modified Rankin (Stroke Patients Only)       Balance Overall balance assessment: Mild deficits observed, not formally tested                                           Pertinent Vitals/Pain Pain Assessment: 0-10 Pain Score: 7  Pain Location: back Pain Descriptors / Indicators: Sore;Operative site guarding Pain Intervention(s): Monitored during session;Repositioned    Home Living Family/patient expects to be discharged to:: Private residence Living Arrangements: Spouse/significant other Available Help at Discharge: Family Type of Home: House Home Access: Level entry     Home Layout: Two level;Bed/bath upstairs Home Equipment: Kasandra Knudsen - single point      Prior Function Level of Independence: Independent with assistive device(s)         Comments: Was using SPC for ambulation recently; doing own ADLs/IADLs as able. Drives. Retired. NO falls.     Hand Dominance   Dominant Hand: Right    Extremity/Trunk Assessment   Upper Extremity Assessment Upper Extremity Assessment: Defer to OT evaluation    Lower Extremity Assessment Lower Extremity Assessment: RLE deficits/detail  RLE Deficits / Details: Grossly ~3+-4/5 throughout RLE Sensation: WNL    Cervical / Trunk Assessment Cervical / Trunk Assessment: Other exceptions Cervical / Trunk Exceptions: s/p spine  surgery.  Communication   Communication: No difficulties  Cognition Arousal/Alertness: Awake/alert Behavior During Therapy: WFL for tasks assessed/performed Overall Cognitive Status: Within Functional Limits for tasks assessed                                        General Comments General comments (skin integrity, edema, etc.): Able to donn brace with setup, no assist needed.    Exercises     Assessment/Plan    PT Assessment Patent does not need any further PT services  PT Problem List         PT Treatment Interventions      PT Goals (Current goals can be found in the Care Plan section)  Acute Rehab PT Goals Patient Stated Goal: to go home and be able to travel PT Goal Formulation: All assessment and education complete, DC therapy    Frequency     Barriers to discharge        Co-evaluation               AM-PAC PT "6 Clicks" Mobility  Outcome Measure Help needed turning from your back to your side while in a flat bed without using bedrails?: None Help needed moving from lying on your back to sitting on the side of a flat bed without using bedrails?: None Help needed moving to and from a bed to a chair (including a wheelchair)?: None Help needed standing up from a chair using your arms (e.g., wheelchair or bedside chair)?: None Help needed to walk in hospital room?: None Help needed climbing 3-5 steps with a railing? : None 6 Click Score: 24    End of Session Equipment Utilized During Treatment: Back brace Activity Tolerance: Patient tolerated treatment well Patient left: in chair;with call bell/phone within reach Nurse Communication: Mobility status PT Visit Diagnosis: Pain;Unsteadiness on feet (R26.81) Pain - part of body:  (back)    Time: 4967-5916 PT Time Calculation (min) (ACUTE ONLY): 20 min   Charges:   PT Evaluation $PT Eval Moderate Complexity: 1 Mod          Marisa Severin, PT, DPT Acute Rehabilitation Services Pager  [phone] Office [place] 11/19/2020, 9:10 AM

## 2020-11-19 NOTE — Evaluation (Signed)
Occupational Therapy Evaluation Patient Details Name: Kenneth [name] MRN: [id number] DOB: 1955-12-28 Today's Date: 11/19/2020    History of Present Illness Patient is a 65 y/o male who presents s/p Left L4-5 L3-4 lumbar radiculopathy with degenerative disc 11/18/20. PMH includes recent back surgery 09/2020, HTN, HIV positive, CAD, COPD, CHF, meningitis.   Clinical Impression   Patient admitted with the above diagnosis and procedure.  He is feeling good, states prior symptoms are better, and he is encouraged.  He is currently able to complete all in room mobility/toileting and ADL at Mod I level.  All questions answered, and he demonstrates a thorough understanding of all precautions.  No further OT needs identified.      Follow Up Recommendations  No OT follow up    Equipment Recommendations  None recommended by OT    Recommendations for Other Services       Precautions / Restrictions Precautions Precautions: Back Precaution Booklet Issued: Yes (comment) Precaution Comments: Reviewed handout and precautions Required Braces or Orthoses: Spinal Brace Spinal Brace: Lumbar corset;Applied in standing position Restrictions Weight Bearing Restrictions: No      Mobility Bed Mobility Overal bed mobility: Modified Independent Bed Mobility: Sidelying to Sit   Sidelying to sit: Modified independent (Device/Increase time)       General bed mobility comments: HOB flat, no use of rails to simulate home. Good demo of log roll techinque,    Transfers Overall transfer level: Modified independent Equipment used: None             General transfer comment: Stood from EOB x1, no assist needed, slow to rise.    Balance Overall balance assessment: Mild deficits observed, not formally tested                                         ADL either performed or assessed with clinical judgement   ADL Overall ADL's : Modified independent                                        General ADL Comments: Able to complete ADL from modified sit/stand level with good understanding of all precautions.     Vision Patient Visual Report: No change from baseline       Perception     Praxis      Pertinent Vitals/Pain Pain Assessment: Faces Pain Score: 7  Faces Pain Scale: Hurts little more Pain Location: back Pain Descriptors / Indicators: Sore;Operative site guarding Pain Intervention(s): Monitored during session     Hand Dominance Right   Extremity/Trunk Assessment Upper Extremity Assessment Upper Extremity Assessment: Overall WFL for tasks assessed   Lower Extremity Assessment Lower Extremity Assessment: Defer to PT evaluation RLE Deficits / Details: Grossly ~3+-4/5 throughout RLE Sensation: WNL   Cervical / Trunk Assessment Cervical / Trunk Assessment: Other exceptions Cervical / Trunk Exceptions: s/p spine surgery.   Communication Communication Communication: No difficulties   Cognition Arousal/Alertness: Awake/alert Behavior During Therapy: WFL for tasks assessed/performed Overall Cognitive Status: Within Functional Limits for tasks assessed                                     General Comments  Able to donn brace with setup, no assist needed.  Exercises     Shoulder Instructions      Home Living Family/patient expects to be discharged to:: Private residence Living Arrangements: Spouse/significant other Available Help at Discharge: Family Type of Home: House Home Access: Level entry     Home Layout: Two level;Bed/bath upstairs Alternate Level Stairs-Number of Steps: 1 flight   Bathroom Shower/Tub: Tub/shower unit         Home Equipment: Cane - single point          Prior Functioning/Environment Level of Independence: Independent with assistive device(s)        Comments: Was using SPC for ambulation recently; doing own ADLs/IADLs as able. Drives. Retired. NO falls.        OT Problem  List: Impaired balance (sitting and/or standing)      OT Treatment/Interventions:      OT Goals(Current goals can be found in the care plan section) Acute Rehab OT Goals Patient Stated Goal: to go home and be able to travel OT Goal Formulation: With patient Potential to Achieve Goals: Good  OT Frequency:     Barriers to D/C:            Co-evaluation              AM-PAC OT "6 Clicks" Daily Activity     Outcome Measure Help from another person eating meals?: None Help from another person taking care of personal grooming?: None Help from another person toileting, which includes using toliet, bedpan, or urinal?: None Help from another person bathing (including washing, rinsing, drying)?: None Help from another person to put on and taking off regular upper body clothing?: None Help from another person to put on and taking off regular lower body clothing?: None 6 Click Score: 24   End of Session Nurse Communication: Mobility status  Activity Tolerance: Patient tolerated treatment well Patient left: in chair;with call bell/phone within reach  OT Visit Diagnosis: Unsteadiness on feet (R26.81)                Time: 3559-7416 OT Time Calculation (min): 17 min Charges:  OT General Charges $OT Visit: 1 Visit OT Evaluation $OT Eval Moderate Complexity: 1 Mod  11/19/2020  Rich, OTR/L  Acute Rehabilitation Services  Office:  [phone]   Metta Clines 11/19/2020, 9:22 AM

## 2020-11-19 NOTE — Discharge Instructions (Signed)

## 2020-11-19 NOTE — Discharge Summary (Signed)
Physician Discharge Summary  Patient ID: Kenneth [name] MRN: [id number] DOB/AGE: [birth year] 66 y.o.  Admit date: 11/18/2020 Discharge date: 11/19/2020  Admission Diagnoses: Left lumbar radiculopathy with degenerative disc disease L4-5 L3-4  Discharge Diagnoses: Left lumbar radiculopathy with degenerative disc disease  L3-4 and L4-5 Active Problems:   Lumbar radiculopathy, chronic   Discharged Condition: good  Hospital Course: Patient was admitted to undergo surgical decompression arthrodesis.  At the time of surgery was noted that L4-5 is already fused.  He underwent decompression of the L4 nerve root which he tolerated well and has had good relief of pain.  Consults: None  Significant Diagnostic Studies: Incision is clean and dry motor function is intact  Treatments: surgery: See op note  Discharge Exam: Blood pressure 112/68, pulse 69, temperature 97.9 F (36.6 C), temperature source Oral, resp. rate 18, height 5\' 7"  (1.702 m), weight 66.2 kg, SpO2 90 %. Incision is clean and dry motor function is intact Station and gait are intact  Disposition: Discharge disposition: 01-Home or Self Care       Discharge Instructions    Call MD for:  redness, tenderness, or signs of infection (pain, swelling, redness, odor or green/yellow discharge around incision site)   Complete by: As directed    Call MD for:  severe uncontrolled pain   Complete by: As directed    Call MD for:  temperature >100.4   Complete by: As directed    Diet - low sodium heart healthy   Complete by: As directed    Diet general   Complete by: As directed    Discharge wound care:   Complete by: As directed    Change dressing with gauze until the wound is dry.  Paint incision with Betadine.   Incentive spirometry RT   Complete by: As directed    Increase activity slowly   Complete by: As directed      Allergies as of 11/19/2020      Reactions   Bee Venom Anaphylaxis   Flexeril [cyclobenzaprine Hcl]  Anaphylaxis, Shortness Of Breath, Rash   Hydrocodone Itching   Ibuprofen Shortness Of Breath, Rash, Other (See Comments)   Stomach burning. Pt takes aspirin at home w/o problems.   Nabumetone Shortness Of Breath, Itching, Other (See Comments)   "made me feel like my wind was maybe cutting off" (09/24/2012)   [name] [naproxen] Shortness Of Breath, Nausea And Vomiting, Rash   itching   Toradol [ketorolac Tromethamine] Anaphylaxis   Tramadol Shortness Of Breath, Rash   Multiple previous morphine administrations ok (07/12/11-DJ) itching   Ace Inhibitors Itching      Other Itching   grits and grape jelly and cole slaw.  Itching of throat      Medication List    TAKE these medications   acetaminophen 500 MG tablet Commonly known as: TYLENOL Take 1,000 mg by mouth every 6 (six) hours as needed for moderate pain.   amLODipine 10 MG tablet Commonly known as: NORVASC Take 1 tablet (10 mg total) by mouth daily.   atorvastatin 20 MG tablet Commonly known as: LIPITOR Take 1 tablet (20 mg total) by mouth daily.   baclofen 10 MG tablet Commonly known as: LIORESAL Take 1 tablet (10 mg total) by mouth 3 (three) times daily.   carvedilol 6.25 MG tablet Commonly known as: COREG Take 1 tablet (6.25 mg total) by mouth 2 (two) times daily.   cephALEXin 500 MG capsule Commonly known as: KEFLEX Take 1 capsule (500 mg total) by mouth  3 (three) times daily for 10 days.   diazepam 5 MG tablet Commonly known as: Valium Take 1 tablet (5 mg total) by mouth every 6 (six) hours as needed for muscle spasms.   diclofenac Sodium 1 % Gel Commonly known as: Voltaren Apply 2 g topically 4 (four) times daily. What changed:   when to take this  reasons to take this   EPINEPHrine 0.3 mg/0.3 mL Soaj injection Commonly known as: EPI-PEN Inject 0.3 mg into the muscle once as needed for anaphylaxis.   furosemide 20 MG tablet Commonly known as: LASIX Take 1 tablet (20 mg total) by mouth daily.    gabapentin 100 MG capsule Commonly known as: Neurontin Take 1 capsule (100 mg total) by mouth 3 (three) times daily. What changed:   when to take this  reasons to take this   Genvoya 150-150-200-10 MG Tabs tablet Generic drug: elvitegravir-cobicistat-emtricitabine-tenofovir Take 1 tablet by mouth daily with breakfast.   methocarbamol 500 MG tablet Commonly known as: Robaxin Take 1 tablet (500 mg total) by mouth 4 (four) times daily.   oxyCODONE-acetaminophen 5-325 MG tablet Commonly known as: PERCOCET/ROXICET Take 1-2 tablets by mouth every 6 (six) hours as needed for moderate pain or severe pain.   sacubitril-valsartan 49-51 MG Commonly known as: ENTRESTO Take 1 tablet by mouth 2 (two) times daily.   Ventolin HFA 108 (90 Base) MCG/ACT inhaler Generic drug: albuterol Inhale 2 puffs every 6 (six) hours as needed into the lungs for wheezing or shortness of breath.            Discharge Care Instructions  (From admission, onward)         Start     Ordered   11/19/20 0000  Discharge wound care:       Comments: Change dressing with gauze until the wound is dry.  Paint incision with Betadine.   11/19/20 8338           Signed: Wolfe 11/19/2020, 8:29 AM

## 2020-11-22 ENCOUNTER — Telehealth: Payer: Self-pay

## 2020-11-22 NOTE — Telephone Encounter (Signed)
Transition Care Management Follow-up Telephone Call  Date of discharge and from where: 11/19/2020, [facility]   How have you been since you were released from the hospital? He said he is doing pretty good.  Any questions or concerns? No  Items Reviewed:  Did the pt receive and understand the discharge instructions provided? Yes   Medications obtained and verified? Yes  - he said he has all medications including the keflex and did not have any questions about his med regime.   Other? No   Any new allergies since your discharge? No }  Do you have support at home? Yes   Home Care and Equipment/Supplies: Were home health services ordered? no If so, what is the name of the agency? n/a  Has the agency set up a time to come to the patient's home? not applicable Were any new equipment or medical supplies ordered?  No What is the name of the medical supply agency? n/a Were you able to get the supplies/equipment? not applicable Do you have any questions related to the use of the equipment or supplies? No   He said that he just took the dressing off of his surgical site and it appears to be healing well.   Functional Questionnaire: (I = Independent and D = Dependent) ADLs: independent.  Has cane to use as needed  Follow up appointments reviewed:   PCP Hospital f/u appt confirmed? Yes  Juluis Mire, NP 11/29/2020.    [facility] f/u appt confirmed? Yes  - Dr Ellene Route - next week per patient.   Are transportation arrangements needed? No   If their condition worsens, is the pt aware to call PCP or go to the Emergency Dept.? Yes  Was the patient provided with contact information for the PCP's office or ED? Yes  Was to pt encouraged to call back with questions or concerns? Yes

## 2020-11-23 MED FILL — Heparin Sodium (Porcine) Inj 1000 Unit/ML: INTRAMUSCULAR | Qty: 30 | Status: AC

## 2020-11-23 MED FILL — Sodium Chloride IV Soln 0.9%: INTRAVENOUS | Qty: 1000 | Status: AC

## 2020-11-29 ENCOUNTER — Ambulatory Visit (INDEPENDENT_AMBULATORY_CARE_PROVIDER_SITE_OTHER): Payer: Medicare HMO | Admitting: Primary Care

## 2021-10-26 ENCOUNTER — Ambulatory Visit: Payer: Medicaid Other | Admitting: Internal Medicine

## 2021-11-08 ENCOUNTER — Ambulatory Visit: Payer: Medicaid Other | Admitting: Internal Medicine

## 2022-01-02 ENCOUNTER — Telehealth: Payer: Self-pay
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
## 2022-06-08 NOTE — Telephone Encounter (Signed)
Erroneous

## 2022-07-25 ENCOUNTER — Emergency Department (HOSPITAL_COMMUNITY)
Admission: EM | Admit: 2022-07-25 | Discharge: 2022-07-25 | Disposition: A | Discharge status: Home / Self Care | Payer: Medicare Other | Attending: Emergency Medicine | Admitting: Emergency Medicine

## 2022-07-25 ENCOUNTER — Other Ambulatory Visit: Payer: Self-pay

## 2022-07-25 ENCOUNTER — Emergency Department (HOSPITAL_COMMUNITY): Payer: Medicare Other

## 2022-07-25 ENCOUNTER — Encounter (HOSPITAL_COMMUNITY): Payer: Self-pay

## 2022-07-25 DIAGNOSIS — Y92810 Car as the place of occurrence of the external cause: Secondary | ICD-10-CM | POA: Insufficient documentation

## 2022-07-25 DIAGNOSIS — W232XXA Caught, crushed, jammed or pinched between a moving and stationary object, initial encounter: Secondary | ICD-10-CM | POA: Insufficient documentation

## 2022-07-25 DIAGNOSIS — S67197A Crushing injury of left little finger, initial encounter: Secondary | ICD-10-CM | POA: Diagnosis not present

## 2022-07-25 DIAGNOSIS — J45909 Unspecified asthma, uncomplicated: Secondary | ICD-10-CM | POA: Insufficient documentation

## 2022-07-25 DIAGNOSIS — S6992XA Unspecified injury of left wrist, hand and finger(s), initial encounter: Secondary | ICD-10-CM

## 2022-07-25 DIAGNOSIS — Z21 Asymptomatic human immunodeficiency virus [HIV] infection status: Secondary | ICD-10-CM | POA: Insufficient documentation

## 2022-07-25 DIAGNOSIS — I110 Hypertensive heart disease with heart failure: Secondary | ICD-10-CM | POA: Insufficient documentation

## 2022-07-25 DIAGNOSIS — I509 Heart failure, unspecified: Secondary | ICD-10-CM | POA: Diagnosis not present

## 2022-07-25 DIAGNOSIS — Z79899 Other long term (current) drug therapy: Secondary | ICD-10-CM | POA: Insufficient documentation

## 2022-07-25 DIAGNOSIS — J449 Chronic obstructive pulmonary disease, unspecified: Secondary | ICD-10-CM | POA: Diagnosis not present

## 2022-07-25 DIAGNOSIS — S6710XA Crushing injury of unspecified finger(s), initial encounter: Secondary | ICD-10-CM

## 2022-07-25 DIAGNOSIS — I2510 Atherosclerotic heart disease of native coronary artery without angina pectoris: Secondary | ICD-10-CM | POA: Insufficient documentation

## 2022-07-25 MED ORDER — OXYCODONE HCL 5 MG PO TABS
5.0000 mg | ORAL_TABLET | Freq: Once | ORAL | Status: AC
  Administered 2022-07-25: 5 mg via ORAL
  Filled 2022-07-25: qty 1

## 2022-07-25 MED ORDER — LIDOCAINE 5 % EX PTCH: 1.0000 | MEDICATED_PATCH | CUTANEOUS | 0 refills | Status: AC

## 2022-07-25 NOTE — ED Provider Triage Note (Signed)
Emergency Medicine Provider Triage Evaluation Note  Derrian Poli , a 66 y.o. male  was evaluated in triage.  Pt complains of left hand pain.  Worse along the pinky.  Happened when grandbabies slammed door on hand..  Review of Systems  Per HPI  Physical Exam  BP (!) 119/91 (BP Location: Right Arm)   Pulse 99   Temp 98.2 F (36.8 C) (Oral)   Resp 16   Ht '5\' 7"'$  (1.702 m)   Wt 66.2 kg   SpO2 95%   BMI 22.87 kg/m  Gen:   Awake, no distress   Resp:  Normal effort  MSK:   Decreased ROM secondary to pain Other:  Refill less than 2, radial pulse 2+  Medical Decision Making  Medically screening exam initiated at 12:51 PM.  Appropriate orders placed.  Tighe Gitto was informed that the remainder of the evaluation will be completed by another provider, this initial triage assessment does not replace that evaluation, and the importance of remaining in the ED until their evaluation is complete.     Sherrill Raring, PA-C 07/25/22 1252

## 2022-07-25 NOTE — Discharge Instructions (Addendum)
For your pain, you may take up to '1000mg'$  of acetaminophen (tylenol) 4 times daily for up to a week. This is the maximum dose of acetminophen (tylenol) you can take from all sources. Please check other over-the-counter medications and prescriptions to ensure you are not taking other medications that contain acetaminophen.  You can use this in addition to the prescribed lidocaine patch.  Recommend ice, elevation. May remove from splint for range of motion exercises and then place again.
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
## 2022-07-25 NOTE — ED Notes (Signed)
Patient verbalizes understanding of discharge instructions. Opportunity for questioning and answers were provided. Armband removed by staff, pt discharged from ED. Pt ambulatory to ED waiting room with steady gait.  

## 2022-07-25 NOTE — ED Provider Notes (Signed)
[place] EMERGENCY DEPARTMENT Provider Note   CSN: [id number] Arrival date & time: 07/25/22  1135     History {Add pertinent medical, surgical, social history, OB history to HPI:1} Chief Complaint  Patient presents with   Finger Injury    Kenneth [name] is a 66 y.o. male.  HPI     Home Medications Prior to Admission medications   Medication Sig Start Date End Date Taking? Authorizing Provider  acetaminophen (TYLENOL) 500 MG tablet Take 1,000 mg by mouth every 6 (six) hours as needed for moderate pain.    [provider]  amLODipine (NORVASC) 10 MG tablet Take 1 tablet (10 mg total) by mouth daily. 12/29/19   Kerin Perna, NP  atorvastatin (LIPITOR) 20 MG tablet Take 1 tablet (20 mg total) by mouth daily. Patient not taking: No sig reported 07/09/20   Rudean Haskell A, MD  baclofen (LIORESAL) 10 MG tablet Take 1 tablet (10 mg total) by mouth 3 (three) times daily. Patient not taking: No sig reported 08/11/19   Margarita Mail, PA-C  carvedilol (COREG) 6.25 MG tablet Take 1 tablet (6.25 mg total) by mouth 2 (two) times daily. 06/15/20   Chandrasekhar, Terisa Starr, MD  diazepam (VALIUM) 5 MG tablet Take 1 tablet (5 mg total) by mouth every 6 (six) hours as needed for muscle spasms. 09/30/20   Kristeen Miss, MD  diclofenac Sodium (VOLTAREN) 1 % GEL Apply 2 g topically 4 (four) times daily. Patient taking differently: Apply 2 g topically 4 (four) times daily as needed (pain). 08/06/19   Tedd Sias, PA  elvitegravir-cobicistat-emtricitabine-tenofovir (GENVOYA) 150-150-200-10 MG TABS tablet Take 1 tablet by mouth daily with breakfast.  08/08/19   [provider]  EPINEPHrine (EPI-PEN) 0.3 mg/0.3 mL SOAJ Inject 0.3 mg into the muscle once as needed for anaphylaxis. 03/09/13   Montine Circle, PA-C  furosemide (LASIX) 20 MG tablet Take 1 tablet (20 mg total) by mouth daily. 06/17/20   Richardson Dopp T, PA-C  gabapentin (NEURONTIN) 100 MG  capsule Take 1 capsule (100 mg total) by mouth 3 (three) times daily. Patient taking differently: Take 100 mg by mouth 3 (three) times daily as needed (pain). 08/24/19   Robinson, [place] N, PA-C  methocarbamol (ROBAXIN) 500 MG tablet Take 1 tablet (500 mg total) by mouth 4 (four) times daily. Patient not taking: No sig reported 01/12/20   Kerin Perna, NP  oxyCODONE-acetaminophen (PERCOCET/ROXICET) 5-325 MG tablet Take 1-2 tablets by mouth every 6 (six) hours as needed for moderate pain or severe pain. 11/19/20   Kristeen Miss, MD  sacubitril-valsartan (ENTRESTO) 49-51 MG Take 1 tablet by mouth 2 (two) times daily. 07/09/20   Chandrasekhar, Terisa Starr, MD  VENTOLIN HFA 108 (90 Base) MCG/ACT inhaler Inhale 2 puffs every 6 (six) hours as needed into the lungs for wheezing or shortness of breath.  07/24/17   [provider]      Allergies    Bee venom, Flexeril [cyclobenzaprine hcl], Hydrocodone, Ibuprofen, Nabumetone, [name] [naproxen], Toradol [ketorolac tromethamine], Tramadol, Ace inhibitors, and Other    Review of Systems   Review of Systems  Physical Exam Updated Vital Signs BP (!) 119/91 (BP Location: Right Arm)   Pulse 99   Temp 98.2 F (36.8 C) (Oral)   Resp 16   Ht '5\' 7"'$  (1.702 m)   Wt 66.2 kg   SpO2 95%   BMI 22.87 kg/m  Physical Exam  ED Results / Procedures / Treatments   Labs (all labs ordered  are listed, but only abnormal results are displayed) Labs Reviewed - No data to display  EKG None  Radiology DG Hand Complete Left  Result Date: 07/25/2022 CLINICAL DATA:  Left small finger seen EXAM: LEFT HAND - COMPLETE 3 VIEW COMPARISON:  None Available. FINDINGS: No evidence of fracture or dislocation. Mild degenerative changes of the triscaphe joint, distal radioulnar joint, thumb CMC joint. Soft tissues are unremarkable. IMPRESSION: 1. No acute fracture or dislocation. 2. Mild degenerative changes as above. Electronically Signed   By: Yetta Glassman M.D.    On: 07/25/2022 13:38    Procedures Procedures  {Document cardiac monitor, telemetry assessment procedure when appropriate:1}  Medications Ordered in ED Medications - No data to display  ED Course/ Medical Decision Making/ A&P                           Medical Decision Making  ***  {Document critical care time when appropriate:1} {Document review of labs and clinical decision tools ie heart score, Chads2Vasc2 etc:1}  {Document your independent review of radiology images, and any outside records:1} {Document your discussion with family members, caretakers, and with consultants:1} {Document social determinants of health affecting pt's care:1} {Document your decision making why or why not admission, treatments were needed:1} Final Clinical Impression(s) / ED Diagnoses Final diagnoses:  None    Rx / DC Orders ED Discharge Orders     None

## 2022-07-25 NOTE — Progress Notes (Signed)
Orthopedic Tech Progress Note Patient Details:  Kenneth [name] [birth year] [id number]  Ortho Devices Type of Ortho Device: Finger splint Ortho Device/Splint Location: LUE Ortho Device/Splint Interventions: Application, Adjustment, Ordered   Post Interventions Patient Tolerated: Well  Kenneth [name] 07/25/2022, 5:20 PM
